# Patient Record
Sex: Female | Born: 1935 | Race: White | Hispanic: No | Smoking: Never smoker
Health system: Southern US, Community
[De-identification: ages and names within clinical notes are randomized; demographics above are authoritative.]

## PROBLEM LIST (undated history)

## (undated) DIAGNOSIS — I471 Supraventricular tachycardia, unspecified: Secondary | ICD-10-CM

## (undated) DIAGNOSIS — K589 Irritable bowel syndrome without diarrhea: Secondary | ICD-10-CM

## (undated) DIAGNOSIS — M1990 Unspecified osteoarthritis, unspecified site: Secondary | ICD-10-CM

## (undated) DIAGNOSIS — I739 Peripheral vascular disease, unspecified: Secondary | ICD-10-CM

## (undated) DIAGNOSIS — I2510 Atherosclerotic heart disease of native coronary artery without angina pectoris: Secondary | ICD-10-CM

## (undated) DIAGNOSIS — D869 Sarcoidosis, unspecified: Secondary | ICD-10-CM

## (undated) DIAGNOSIS — E785 Hyperlipidemia, unspecified: Secondary | ICD-10-CM

## (undated) DIAGNOSIS — E669 Obesity, unspecified: Secondary | ICD-10-CM

## (undated) DIAGNOSIS — K5730 Diverticulosis of large intestine without perforation or abscess without bleeding: Secondary | ICD-10-CM

## (undated) DIAGNOSIS — I872 Venous insufficiency (chronic) (peripheral): Secondary | ICD-10-CM

## (undated) DIAGNOSIS — F419 Anxiety disorder, unspecified: Secondary | ICD-10-CM

## (undated) DIAGNOSIS — Z9981 Dependence on supplemental oxygen: Secondary | ICD-10-CM

## (undated) DIAGNOSIS — R42 Dizziness and giddiness: Secondary | ICD-10-CM

## (undated) DIAGNOSIS — E119 Type 2 diabetes mellitus without complications: Secondary | ICD-10-CM

## (undated) DIAGNOSIS — E559 Vitamin D deficiency, unspecified: Secondary | ICD-10-CM

## (undated) DIAGNOSIS — I4891 Unspecified atrial fibrillation: Secondary | ICD-10-CM

## (undated) DIAGNOSIS — D696 Thrombocytopenia, unspecified: Secondary | ICD-10-CM

## (undated) DIAGNOSIS — K219 Gastro-esophageal reflux disease without esophagitis: Secondary | ICD-10-CM

## (undated) DIAGNOSIS — J45909 Unspecified asthma, uncomplicated: Secondary | ICD-10-CM

## (undated) DIAGNOSIS — I491 Atrial premature depolarization: Secondary | ICD-10-CM

## (undated) DIAGNOSIS — I38 Endocarditis, valve unspecified: Secondary | ICD-10-CM

## (undated) DIAGNOSIS — M797 Fibromyalgia: Secondary | ICD-10-CM

## (undated) DIAGNOSIS — D649 Anemia, unspecified: Secondary | ICD-10-CM

## (undated) DIAGNOSIS — I10 Essential (primary) hypertension: Secondary | ICD-10-CM

## (undated) DIAGNOSIS — N182 Chronic kidney disease, stage 2 (mild): Secondary | ICD-10-CM

## (undated) HISTORY — PX: CHOLECYSTECTOMY: SHX55

## (undated) HISTORY — PX: TONSILLECTOMY: SUR1361

## (undated) HISTORY — DX: Gastro-esophageal reflux disease without esophagitis: K21.9

## (undated) HISTORY — DX: Venous insufficiency (chronic) (peripheral): I87.2

## (undated) HISTORY — DX: Irritable bowel syndrome, unspecified: K58.9

## (undated) HISTORY — DX: Dizziness and giddiness: R42

## (undated) HISTORY — PX: HERNIA REPAIR: SHX51

## (undated) HISTORY — DX: Sarcoidosis, unspecified: D86.9

## (undated) HISTORY — DX: Peripheral vascular disease, unspecified: I73.9

## (undated) HISTORY — DX: Diverticulosis of large intestine without perforation or abscess without bleeding: K57.30

## (undated) HISTORY — PX: CATARACT EXTRACTION: SUR2

## (undated) HISTORY — DX: Essential (primary) hypertension: I10

## (undated) HISTORY — DX: Anemia, unspecified: D64.9

## (undated) HISTORY — DX: Unspecified osteoarthritis, unspecified site: M19.90

## (undated) HISTORY — DX: Type 2 diabetes mellitus without complications: E11.9

## (undated) HISTORY — DX: Anxiety disorder, unspecified: F41.9

## (undated) HISTORY — DX: Hyperlipidemia, unspecified: E78.5

## (undated) HISTORY — DX: Vitamin D deficiency, unspecified: E55.9

## (undated) HISTORY — DX: Unspecified asthma, uncomplicated: J45.909

## (undated) HISTORY — DX: Fibromyalgia: M79.7

---

## 1999-05-08 ENCOUNTER — Encounter: Payer: Self-pay | Admitting: Neurology

## 1999-05-08 ENCOUNTER — Ambulatory Visit (HOSPITAL_COMMUNITY): Admission: RE | Admit: 1999-05-08 | Discharge: 1999-05-08 | Payer: Self-pay | Admitting: Neurology

## 1999-05-20 ENCOUNTER — Ambulatory Visit (HOSPITAL_COMMUNITY): Admission: RE | Admit: 1999-05-20 | Discharge: 1999-05-20 | Payer: Self-pay | Admitting: Psychology

## 1999-05-20 ENCOUNTER — Encounter: Payer: Self-pay | Admitting: Neurology

## 2002-12-12 ENCOUNTER — Ambulatory Visit (HOSPITAL_COMMUNITY): Admission: RE | Admit: 2002-12-12 | Discharge: 2002-12-12 | Payer: Self-pay | Admitting: Pulmonary Disease

## 2002-12-12 ENCOUNTER — Encounter: Payer: Self-pay | Admitting: Pulmonary Disease

## 2004-01-04 ENCOUNTER — Inpatient Hospital Stay (HOSPITAL_COMMUNITY): Admission: EM | Admit: 2004-01-04 | Discharge: 2004-01-07 | Payer: Self-pay | Admitting: Emergency Medicine

## 2004-05-06 ENCOUNTER — Ambulatory Visit: Payer: Self-pay | Admitting: Pulmonary Disease

## 2004-05-19 ENCOUNTER — Ambulatory Visit: Payer: Self-pay

## 2004-06-18 ENCOUNTER — Ambulatory Visit: Payer: Self-pay | Admitting: Pulmonary Disease

## 2004-07-22 ENCOUNTER — Ambulatory Visit: Payer: Self-pay | Admitting: Internal Medicine

## 2004-08-05 ENCOUNTER — Ambulatory Visit: Payer: Self-pay | Admitting: Pulmonary Disease

## 2004-08-26 ENCOUNTER — Ambulatory Visit: Payer: Self-pay | Admitting: Internal Medicine

## 2004-10-22 ENCOUNTER — Ambulatory Visit: Payer: Self-pay | Admitting: Pulmonary Disease

## 2004-11-06 ENCOUNTER — Ambulatory Visit: Payer: Self-pay

## 2005-01-20 ENCOUNTER — Ambulatory Visit: Payer: Self-pay | Admitting: Pulmonary Disease

## 2005-04-21 ENCOUNTER — Ambulatory Visit: Payer: Self-pay | Admitting: Pulmonary Disease

## 2005-05-09 ENCOUNTER — Emergency Department (HOSPITAL_COMMUNITY): Admission: EM | Admit: 2005-05-09 | Discharge: 2005-05-09 | Payer: Self-pay | Admitting: Emergency Medicine

## 2005-05-13 ENCOUNTER — Ambulatory Visit: Payer: Self-pay | Admitting: Orthopedic Surgery

## 2005-06-27 ENCOUNTER — Encounter: Admission: RE | Admit: 2005-06-27 | Discharge: 2005-06-27 | Payer: Self-pay | Admitting: Neurology

## 2005-07-07 ENCOUNTER — Ambulatory Visit: Payer: Self-pay | Admitting: Pulmonary Disease

## 2005-09-30 ENCOUNTER — Ambulatory Visit: Payer: Self-pay | Admitting: Orthopedic Surgery

## 2005-10-20 ENCOUNTER — Ambulatory Visit: Payer: Self-pay | Admitting: Pulmonary Disease

## 2005-12-08 ENCOUNTER — Ambulatory Visit: Payer: Self-pay | Admitting: Pulmonary Disease

## 2006-02-23 ENCOUNTER — Ambulatory Visit: Payer: Self-pay | Admitting: Pulmonary Disease

## 2006-02-24 ENCOUNTER — Ambulatory Visit: Payer: Self-pay | Admitting: Orthopedic Surgery

## 2006-05-19 ENCOUNTER — Ambulatory Visit: Payer: Self-pay | Admitting: Orthopedic Surgery

## 2006-06-29 ENCOUNTER — Ambulatory Visit: Payer: Self-pay | Admitting: Pulmonary Disease

## 2006-06-29 LAB — CONVERTED CEMR LAB
Albumin: 4.1 g/dL (ref 3.5–5.2)
Basophils Absolute: 0 10*3/uL (ref 0.0–0.1)
Bilirubin Urine: NEGATIVE
Bilirubin, Direct: 0.1 mg/dL (ref 0.0–0.3)
Cholesterol: 121 mg/dL (ref 0–200)
Eosinophils Absolute: 0.3 10*3/uL (ref 0.0–0.6)
GFR calc Af Amer: 77 mL/min
GFR calc non Af Amer: 64 mL/min
HDL: 46 mg/dL (ref >39.0)
Hemoglobin: 11.7 g/dL — ABNORMAL LOW (ref 12.0–15.0)
Hgb A1c MFr Bld: 6.1 % — ABNORMAL HIGH (ref 4.6–6.0)
Ketones, ur: NEGATIVE mg/dL
LDL Cholesterol: 51 mg/dL (ref 0–99)
MCHC: 35.4 g/dL (ref 30.0–36.0)
Monocytes Absolute: 0.4 10*3/uL (ref 0.2–0.7)
Monocytes Relative: 5.4 % (ref 3.0–11.0)
Nitrite: NEGATIVE
Potassium: 4.1 meq/L (ref 3.5–5.1)
RDW: 13.5 % (ref 11.5–14.6)
Sodium: 138 meq/L (ref 135–145)
Specific Gravity, Urine: 1.025 (ref 1.000–1.03)
Total CHOL/HDL Ratio: 2.6
Urine Glucose: NEGATIVE mg/dL
Urobilinogen, UA: 0.2 (ref 0.0–1.0)
pH: 5.5 (ref 5.0–8.0)

## 2006-09-01 ENCOUNTER — Ambulatory Visit: Payer: Self-pay | Admitting: Orthopedic Surgery

## 2006-10-26 ENCOUNTER — Ambulatory Visit: Payer: Self-pay | Admitting: Pulmonary Disease

## 2006-12-08 ENCOUNTER — Ambulatory Visit: Payer: Self-pay | Admitting: Orthopedic Surgery

## 2007-03-01 ENCOUNTER — Ambulatory Visit: Payer: Self-pay | Admitting: Pulmonary Disease

## 2007-03-14 ENCOUNTER — Ambulatory Visit: Payer: Self-pay | Admitting: Orthopedic Surgery

## 2007-06-14 ENCOUNTER — Ambulatory Visit: Payer: Self-pay | Admitting: Pulmonary Disease

## 2007-06-14 DIAGNOSIS — J209 Acute bronchitis, unspecified: Secondary | ICD-10-CM

## 2007-06-14 DIAGNOSIS — I739 Peripheral vascular disease, unspecified: Secondary | ICD-10-CM

## 2007-06-14 DIAGNOSIS — E785 Hyperlipidemia, unspecified: Secondary | ICD-10-CM

## 2007-06-14 DIAGNOSIS — D869 Sarcoidosis, unspecified: Secondary | ICD-10-CM | POA: Insufficient documentation

## 2007-06-14 DIAGNOSIS — E663 Overweight: Secondary | ICD-10-CM | POA: Insufficient documentation

## 2007-06-14 DIAGNOSIS — K219 Gastro-esophageal reflux disease without esophagitis: Secondary | ICD-10-CM | POA: Insufficient documentation

## 2007-06-14 DIAGNOSIS — IMO0001 Reserved for inherently not codable concepts without codable children: Secondary | ICD-10-CM

## 2007-06-14 DIAGNOSIS — I872 Venous insufficiency (chronic) (peripheral): Secondary | ICD-10-CM | POA: Insufficient documentation

## 2007-06-14 DIAGNOSIS — E119 Type 2 diabetes mellitus without complications: Secondary | ICD-10-CM | POA: Insufficient documentation

## 2007-06-14 DIAGNOSIS — I10 Essential (primary) hypertension: Secondary | ICD-10-CM | POA: Insufficient documentation

## 2007-06-14 DIAGNOSIS — K589 Irritable bowel syndrome without diarrhea: Secondary | ICD-10-CM

## 2007-06-14 DIAGNOSIS — F411 Generalized anxiety disorder: Secondary | ICD-10-CM | POA: Insufficient documentation

## 2007-06-19 LAB — CONVERTED CEMR LAB
AST: 19 units/L (ref 0–37)
Albumin: 4 g/dL (ref 3.5–5.2)
Basophils Absolute: 0 10*3/uL (ref 0.0–0.1)
Bilirubin, Direct: 0.1 mg/dL (ref 0.0–0.3)
Calcium: 9.1 mg/dL (ref 8.4–10.5)
Chloride: 97 meq/L (ref 96–112)
Cholesterol: 137 mg/dL (ref 0–200)
Eosinophils Absolute: 0.5 10*3/uL (ref 0.0–0.6)
GFR calc Af Amer: 89 mL/min
GFR calc non Af Amer: 73 mL/min
Glucose, Bld: 137 mg/dL — ABNORMAL HIGH (ref 70–99)
Hgb A1c MFr Bld: 6.2 % — ABNORMAL HIGH (ref 4.6–6.0)
Lymphocytes Relative: 15.7 % (ref 12.0–46.0)
MCHC: 34.6 g/dL (ref 30.0–36.0)
MCV: 84.5 fL (ref 78.0–100.0)
Neutro Abs: 5.3 10*3/uL (ref 1.4–7.7)
Neutrophils Relative %: 72.3 % (ref 43.0–77.0)
Platelets: 199 10*3/uL (ref 150–400)
RBC: 3.7 M/uL — ABNORMAL LOW (ref 3.87–5.11)
Sodium: 138 meq/L (ref 135–145)
TSH: 6.02 microintl units/mL — ABNORMAL HIGH (ref 0.35–5.50)
Total CHOL/HDL Ratio: 3.7
Triglycerides: 165 mg/dL — ABNORMAL HIGH (ref 0–149)

## 2007-06-20 ENCOUNTER — Ambulatory Visit: Payer: Self-pay | Admitting: Orthopedic Surgery

## 2007-06-20 DIAGNOSIS — M25569 Pain in unspecified knee: Secondary | ICD-10-CM

## 2007-08-09 ENCOUNTER — Encounter: Payer: Self-pay | Admitting: Pulmonary Disease

## 2007-10-05 ENCOUNTER — Ambulatory Visit: Payer: Self-pay | Admitting: Orthopedic Surgery

## 2007-10-18 ENCOUNTER — Ambulatory Visit: Payer: Self-pay | Admitting: Pulmonary Disease

## 2007-10-18 DIAGNOSIS — D649 Anemia, unspecified: Secondary | ICD-10-CM

## 2007-10-22 LAB — CONVERTED CEMR LAB
BUN: 29 mg/dL — ABNORMAL HIGH (ref 6–23)
Basophils Absolute: 0 10*3/uL (ref 0.0–0.1)
Basophils Relative: 0.1 % (ref 0.0–1.0)
CO2: 31 meq/L (ref 19–32)
Chloride: 102 meq/L (ref 96–112)
Eosinophils Relative: 3.4 % (ref 0.0–5.0)
GFR calc Af Amer: 61 mL/min
Glucose, Bld: 103 mg/dL — ABNORMAL HIGH (ref 70–99)
Hemoglobin: 11.7 g/dL — ABNORMAL LOW (ref 12.0–15.0)
Hgb A1c MFr Bld: 6.2 % — ABNORMAL HIGH (ref 4.6–6.0)
Lymphocytes Relative: 13.9 % (ref 12.0–46.0)
MCHC: 33.5 g/dL (ref 30.0–36.0)
Monocytes Relative: 4 % (ref 3.0–12.0)
Neutro Abs: 8.9 10*3/uL — ABNORMAL HIGH (ref 1.4–7.7)
Neutrophils Relative %: 78.6 % — ABNORMAL HIGH (ref 43.0–77.0)
Potassium: 4.5 meq/L (ref 3.5–5.1)
RBC: 3.99 M/uL (ref 3.87–5.11)
Saturation Ratios: 17.3 % — ABNORMAL LOW (ref 20.0–50.0)
Sodium: 139 meq/L (ref 135–145)
Transferrin: 264.9 mg/dL (ref >=212.0)
WBC: 11.4 10*3/uL — ABNORMAL HIGH (ref 4.5–10.5)

## 2008-01-11 ENCOUNTER — Ambulatory Visit: Payer: Self-pay | Admitting: Orthopedic Surgery

## 2008-01-11 DIAGNOSIS — IMO0002 Reserved for concepts with insufficient information to code with codable children: Secondary | ICD-10-CM

## 2008-01-16 ENCOUNTER — Encounter: Payer: Self-pay | Admitting: Orthopedic Surgery

## 2008-01-26 ENCOUNTER — Encounter: Payer: Self-pay | Admitting: Orthopedic Surgery

## 2008-02-21 ENCOUNTER — Ambulatory Visit: Payer: Self-pay | Admitting: Pulmonary Disease

## 2008-02-24 LAB — CONVERTED CEMR LAB
BUN: 18 mg/dL (ref 6–23)
Calcium: 9.4 mg/dL (ref 8.4–10.5)
Eosinophils Absolute: 0.3 10*3/uL (ref 0.0–0.7)
Eosinophils Relative: 3.5 % (ref 0.0–5.0)
GFR calc Af Amer: 77 mL/min
GFR calc non Af Amer: 64 mL/min
Glucose, Bld: 119 mg/dL — ABNORMAL HIGH (ref 70–99)
HCT: 32.8 % — ABNORMAL LOW (ref 36.0–46.0)
HDL: 47.4 mg/dL (ref >39.0)
Hemoglobin: 11.4 g/dL — ABNORMAL LOW (ref 12.0–15.0)
MCV: 86.4 fL (ref 78.0–100.0)
Monocytes Absolute: 0.3 10*3/uL (ref 0.1–1.0)
Monocytes Relative: 3.8 % (ref 3.0–12.0)
Neutro Abs: 6.9 10*3/uL (ref 1.4–7.7)
RDW: 13.8 % (ref 11.5–14.6)
Sodium: 139 meq/L (ref 135–145)
Total CHOL/HDL Ratio: 2.8
Triglycerides: 141 mg/dL (ref 0–149)

## 2008-04-25 ENCOUNTER — Ambulatory Visit: Payer: Self-pay | Admitting: Orthopedic Surgery

## 2008-06-26 ENCOUNTER — Ambulatory Visit: Payer: Self-pay | Admitting: Pulmonary Disease

## 2008-06-28 DIAGNOSIS — R42 Dizziness and giddiness: Secondary | ICD-10-CM | POA: Insufficient documentation

## 2008-08-06 ENCOUNTER — Ambulatory Visit: Payer: Self-pay | Admitting: Orthopedic Surgery

## 2008-08-07 ENCOUNTER — Ambulatory Visit: Payer: Self-pay | Admitting: Pulmonary Disease

## 2008-10-23 ENCOUNTER — Ambulatory Visit: Payer: Self-pay | Admitting: Pulmonary Disease

## 2008-10-28 LAB — CONVERTED CEMR LAB
Alkaline Phosphatase: 68 units/L (ref 39–117)
Basophils Relative: 0.3 % (ref 0.0–3.0)
Bilirubin, Direct: 0.1 mg/dL (ref 0.0–0.3)
Calcium: 9.1 mg/dL (ref 8.4–10.5)
Creatinine, Ser: 1 mg/dL (ref 0.4–1.2)
Eosinophils Absolute: 0.5 10*3/uL (ref 0.0–0.7)
Eosinophils Relative: 5.9 % — ABNORMAL HIGH (ref 0.0–5.0)
GFR calc non Af Amer: 56.31 mL/min (ref >60)
HDL: 45.7 mg/dL (ref >39.00)
LDL Cholesterol: 47 mg/dL (ref 0–99)
Lymphocytes Relative: 15.8 % (ref 12.0–46.0)
MCHC: 35.1 g/dL (ref 30.0–36.0)
Monocytes Relative: 4.1 % (ref 3.0–12.0)
Neutrophils Relative %: 73.9 % (ref 43.0–77.0)
RBC: 3.76 M/uL — ABNORMAL LOW (ref 3.87–5.11)
Total CHOL/HDL Ratio: 3
Total Protein: 7.7 g/dL (ref 6.0–8.3)
Transferrin: 237.8 mg/dL (ref 212.0–360.0)
Triglycerides: 190 mg/dL — ABNORMAL HIGH (ref 0.0–149.0)
WBC: 9.2 10*3/uL (ref 4.5–10.5)

## 2008-10-30 LAB — CONVERTED CEMR LAB: Vit D, 25-Hydroxy: 13 ng/mL — ABNORMAL LOW (ref 30–89)

## 2008-11-14 ENCOUNTER — Ambulatory Visit: Payer: Self-pay | Admitting: Orthopedic Surgery

## 2008-11-14 DIAGNOSIS — M25559 Pain in unspecified hip: Secondary | ICD-10-CM

## 2009-02-19 ENCOUNTER — Ambulatory Visit: Payer: Self-pay | Admitting: Orthopedic Surgery

## 2009-02-19 DIAGNOSIS — M1710 Unilateral primary osteoarthritis, unspecified knee: Secondary | ICD-10-CM

## 2009-02-26 ENCOUNTER — Ambulatory Visit: Payer: Self-pay | Admitting: Pulmonary Disease

## 2009-02-26 DIAGNOSIS — E559 Vitamin D deficiency, unspecified: Secondary | ICD-10-CM

## 2009-04-16 ENCOUNTER — Telehealth: Payer: Self-pay | Admitting: Orthopedic Surgery

## 2009-04-17 ENCOUNTER — Ambulatory Visit: Payer: Self-pay | Admitting: Orthopedic Surgery

## 2009-04-17 DIAGNOSIS — M24469 Recurrent dislocation, unspecified knee: Secondary | ICD-10-CM

## 2009-05-29 ENCOUNTER — Ambulatory Visit: Payer: Self-pay | Admitting: Orthopedic Surgery

## 2009-05-29 DIAGNOSIS — M76899 Other specified enthesopathies of unspecified lower limb, excluding foot: Secondary | ICD-10-CM

## 2009-07-09 ENCOUNTER — Ambulatory Visit: Payer: Self-pay | Admitting: Pulmonary Disease

## 2009-07-13 LAB — CONVERTED CEMR LAB
ALT: 12 units/L (ref 0–35)
AST: 19 units/L (ref 0–37)
Albumin: 3.9 g/dL (ref 3.5–5.2)
BUN: 18 mg/dL (ref 6–23)
Chloride: 101 meq/L (ref 96–112)
Cholesterol: 129 mg/dL (ref 0–200)
Eosinophils Relative: 4.7 % (ref 0.0–5.0)
GFR calc non Af Amer: 56.21 mL/min (ref >60)
Glucose, Bld: 122 mg/dL — ABNORMAL HIGH (ref 70–99)
Hgb A1c MFr Bld: 6.3 % (ref 4.6–6.5)
Iron: 36 ug/dL — ABNORMAL LOW (ref 42–145)
Lymphocytes Relative: 16.5 % (ref 12.0–46.0)
Monocytes Relative: 4.1 % (ref 3.0–12.0)
Neutrophils Relative %: 74.5 % (ref 43.0–77.0)
Platelets: 170 10*3/uL (ref 150.0–400.0)
Potassium: 4.1 meq/L (ref 3.5–5.1)
Saturation Ratios: 12 % — ABNORMAL LOW (ref 20.0–50.0)
TSH: 4.63 microintl units/mL (ref 0.35–5.50)
VLDL: 25.8 mg/dL (ref 0.0–40.0)
WBC: 7.5 10*3/uL (ref 4.5–10.5)

## 2009-08-28 ENCOUNTER — Ambulatory Visit: Payer: Self-pay | Admitting: Orthopedic Surgery

## 2009-09-09 ENCOUNTER — Telehealth (INDEPENDENT_AMBULATORY_CARE_PROVIDER_SITE_OTHER): Payer: Self-pay | Admitting: *Deleted

## 2009-11-12 ENCOUNTER — Ambulatory Visit: Payer: Self-pay | Admitting: Internal Medicine

## 2009-11-12 ENCOUNTER — Ambulatory Visit: Payer: Self-pay | Admitting: Pulmonary Disease

## 2009-11-27 ENCOUNTER — Ambulatory Visit: Payer: Self-pay | Admitting: Orthopedic Surgery

## 2010-02-26 ENCOUNTER — Ambulatory Visit: Payer: Self-pay | Admitting: Orthopedic Surgery

## 2010-03-19 ENCOUNTER — Ambulatory Visit: Payer: Self-pay | Admitting: Pulmonary Disease

## 2010-03-27 ENCOUNTER — Encounter: Payer: Self-pay | Admitting: Pulmonary Disease

## 2010-06-10 ENCOUNTER — Ambulatory Visit
Admission: RE | Admit: 2010-06-10 | Discharge: 2010-06-10 | Payer: Self-pay | Source: Home / Self Care | Attending: Orthopedic Surgery | Admitting: Orthopedic Surgery

## 2010-06-29 LAB — CONVERTED CEMR LAB
Albumin ELP: 55.2 % — ABNORMAL LOW (ref 55.8–66.1)
Alpha-1-Globulin: 5 % — ABNORMAL HIGH (ref 2.9–4.9)
Alpha-2-Globulin: 15 % — ABNORMAL HIGH (ref 7.1–11.8)
BUN: 20 mg/dL (ref 6–23)
Basophils Absolute: 0 10*3/uL (ref 0.0–0.1)
CO2: 32 meq/L (ref 19–32)
Eosinophils Absolute: 0.6 10*3/uL (ref 0.0–0.7)
GFR calc non Af Amer: 62.62 mL/min (ref >60)
Glucose, Bld: 106 mg/dL — ABNORMAL HIGH (ref 70–99)
Hemoglobin: 10.9 g/dL — ABNORMAL LOW (ref 12.0–15.0)
IgM, Serum: 126 mg/dL (ref 60–263)
Lymphocytes Relative: 14.5 % (ref 12.0–46.0)
MCHC: 34.5 g/dL (ref 30.0–36.0)
Monocytes Absolute: 0.4 10*3/uL (ref 0.1–1.0)
Neutro Abs: 6.6 10*3/uL (ref 1.4–7.7)
Neutrophils Relative %: 73.8 % (ref 43.0–77.0)
Potassium: 4.8 meq/L (ref 3.5–5.1)
RDW: 13.9 % (ref 11.5–14.6)
Saturation Ratios: 19.2 % — ABNORMAL LOW (ref 20.0–50.0)
Total Protein, Serum Electrophoresis: 7.2 g/dL (ref 6.0–8.3)

## 2010-07-03 NOTE — Progress Notes (Signed)
Summary: prescription  Phone Note Call from Patient Call back at Home Phone [phone] Call back at [phone]   Caller: Patient Call For: nadel Summary of Call: Pt said SN changed her metoprolol tartrate from 50mg  to 25mg  and she wasn't informed of the change. She states that she still has 50mg  tabs left and wants to know what to do, pls advise. Initial call taken by: Darletta Moll,  September 09, 2009 4:36 PM  Follow-up for Phone Call        called, spoke with pt.  Pt states she has been taking metoprolol tartrate 50mg  two times a day.  However, she picked up her new metoprolol rx the other day and it was for 25mg  two times a day.  Requesting clarrification if she should be on 25mg  or 50mg .  I do not see where we have sent rx for 50mg  before in EMR.  Called [facility],spoke with Jonny Ruiz.  Per Jonny Ruiz, pt has filled Metoprolol Tartrate 50mg  last in Jan 25. Rx by Dr. Kriste Basque.    Will forward to SN - pls advise.  Thanks!  Follow-up by: Gweneth Dimitri RN,  September 09, 2009 5:14 PM  Additional Follow-up for Phone Call Additional follow up Details #1::        [id number]    per SN---she should be taking the metoprolol 25 mg  1 by mouth two times a day and she will stay on this dose.  thanks Randell Loop CMA  September 10, 2009 3:27 PM n    Additional Follow-up for Phone Call Additional follow up Details #2::    PT instructed that she should be on Metoprolol 25mg  one by mouth two times a day .  Pt verbalized understanding. Follow-up by: Abigail Miyamoto RN,  September 12, 2009 9:38 AM

## 2010-07-03 NOTE — Miscellaneous (Signed)
Summary: BONE DENSITY  Clinical Lists Changes  Orders: Added new Test order of T-Bone Densitometry [phone]) - Signed Added new Test order of T-Lumbar Vertebral Assessment (60454) - Signed  Appended Document: BONE DENSITY called and spoke with pt about her bmd results.  she is aware to start on the fosamax once weekly--this will be [facility]---cont the calcium, vit d, mvi---take the alendronate 70mg   first thing in the morning and wait 1 hour prior to eating.  Appended Document: BONE DENSITY pt started on fosamax due to bmd results.  pt is aware that rx has been sent to the pharmacy [facility]  December 11, 2009 2:12 PM    Clinical Lists Changes  Medications: Added new medication of ALENDRONATE SODIUM 70 MG TABS (ALENDRONATE SODIUM) take one tablet by mouth once a week--1 hour prior to eating - Signed Rx of ALENDRONATE SODIUM 70 MG TABS (ALENDRONATE SODIUM) take one tablet by mouth once a week--1 hour prior to eating;  #4 x 11;  Signed;  Entered by: Randell Loop CMA;  Authorized by: [name] MD;  Method used: Electronically to [facility] [address], [place], [place], [place]  [address], Ph: [phone], Fax: [phone]    Prescriptions: ALENDRONATE SODIUM 70 MG TABS (ALENDRONATE SODIUM) take one tablet by mouth once a week--1 hour prior to eating  #4 x 11   Entered by:   Randell Loop CMA   Authorized by:   [name] MD   Signed by:   Randell Loop CMA on 12/11/2009   Method used:   Electronically to        [facility]* (retail)       [address]       [place], [place]  [address]       Ph: [phone]       Fax: [phone]   RxID:   [id number]

## 2010-07-03 NOTE — Assessment & Plan Note (Signed)
Summary: 1m reck/klw   CC:  4 month ROV & review of mult medical problems....  History of Present Illness: 77 y/o WF here for a 4 month follow up visit... he has multiple medical problems as noted below...    ~  Sep09:  she has been doing reasonably well- she sees DrHarrison for Ortho regularly w/ shots in her knees every 70months... she is pleased w/ the results- decr pain, incr mobilitiy, and she won't need to consider surgery...  ~  May10:  stable on meds... no new complaints or concerns- she's wearing new compression hose and new balance shoes w/ decr discomfort and swelling... difficulty w/ exercise & we discussed diet + exercise to get her weight down...  ~  Sep10:  she had left cataract surg by [facility] in [place] but had some complic w/ increased eye pressure & headaches- now better... she also had shots in both knees, & left hip by DrHarrison last week- improved... stable & no new complaints or concerns...   ~  July 09, 2009:  c/o incr arthitic symptoms- continue to get shots in knees every 70mo by [employer]... she wants me to make her decision regarding TKR surg- discussed w/ pt... otherw stable- due for CXR/ EKG/ fasting labs... Rx refills for 2011...   ~  November 12, 2009:  she notes "throat closing" on occas & she recalls similar symptom resolved w/ Vistaril Rx yrs ago- we will refill Hydroxyzine capsules for Prn use...  c/o feeling cold & wonders if her Hg is lower or the iron not working... states breathing is OK, no CP/ palpit/ incr SOB/ edema/ etc... notes weight down 7# to 203# today... BP controlled;  Chol OK on the Simva40 & no myopathy symptoms;  Sugars well controlled on the Metformin... we discussed f/u anemia labs today.    Current Problem List:  Hx of ASTHMATIC BRONCHITIS, ACUTE (ICD-466.0) - uses Mucinex as needed... she has chr stable DOE w/o change... she say she walks "some" but is obviously too sedentary due to her arthritis.  Hx of SARCOIDOSIS (ICD-135) - initially  diagnosed in 1992 w/ CXR showing diffuse ILDis, +Gallium scan, ACE=61, & Bronch w/ bx showing non-caseating granulomas... treated w/ Pred & weaned off w/ no active disease since then... last CTChest 7/04 w/ ca++ Ao & coronaries, +mediastinal fat, centilobular emphysema, some scarring... stable without new symptoms of cough, sputum, CP or dyspnea...  ~  f/u CXR 1/09 showed stable chr lung changes & prom right hilum...  ~  f/u CXR 2/11 showed diffuse ILDz, no adenopathy, NAD...  HYPERTENSION (ICD-401.9) - controlled on meds: METOPROLOL 25mg Bid, AVAPRO 150mg /d, LASIX 40mg /d... BP= 138/72 today, tol Rx well...  denies HA, visual changes, CP, palipit, syncope, change in dyspnea, edema, etc...  ~  Myoview 12/05 was neg- no ischemia, no infarct...  ~  EKG 2/11 showed NSR, rate 82/min, WNL.Marland KitchenMarland Kitchen  PERIPHERAL VASCULAR DISEASE (ICD-443.9) - on ASA 81mg /d, PLAVIX 75mg /d... doing well w/o claud symptoms...  ~  Art Dopplers of LE's 6/06 showed biphasic waveforms in ant tib & peroneal arts, normal ABI's...  VENOUS INSUFFICIENCY (ICD-459.81) - on low sodium diet and the Lasix... she knows to elevate legs, wear support hose, etc...  HYPERLIPIDEMIA (ICD-272.4) - on SIMVASTATIN 40mg /d...  ~  FLP 1/08 showed TChol 121, TG 120, HDL 46, LDL 51... looks great- continue Simva40.  ~  FLP 1/09 showed TChol 137, TG 165, HDL 37, LDL 67  ~  FLP 9/09 showed TChol 133, TG 141, HDL 47, LDL  57  ~  FLP 5/10 showed TChol 131, TG 190, HDL 46, LDL 47  ~  FLP 2/11 showed TChol 129, TG 129, HDL 52, LDL 51  DM (ICD-250.00) - on diet + METFORMIN 500mg - 1Bid... unable to exercise or lose wt...   ~  labs 1/08 showed BS=130 and HgA1c=6.1  ~  labs 1/09 showed BS= 137, HgA1c= 6.2... diet and exercise are the keys!!!  ~  labs 9/09 showed BS= 119, HgA1c= 6.3  ~  labs 5/10 showed BS= 134, A1c= 6.3  ~  labs 2/11 showed BS= 122, A1c= 6.3  MORBID OBESITY (ICD-278.01) - prev wt 227#  & 5\' 2"  tall for a BMI=41-42...   ~  weight 1/10 = 217#  ~   weight 5/10 = 215#  ~  weight 9/10 = 210#  ~  weight 2/11 = 210#  ~  weight 6/11 = 203#  GERD (ICD-530.81) - pt uses OTC Prilosec Prn... denies CP, dysphagia, reflux, N/ V, etc...  DIVERTICULOSIS, COLON (ICD-562.10) - had colonoscopy by DrDBrodie 10-23-95 that was neg x divertics... now followed in /[place]...  IRRITABLE BOWEL SYNDROME (ICD-564.1)  DEGENERATIVE JOINT DISEASE, ADVANCED (ICD-715.90) - she has mod severe bilat knee pain and sees DrHarrison, Ortho for shots "every three months"... she is opposed to surgery (see his EMR notes- reviewed)...  VITAMIN D DEFICIENCY (ICD-268.9)  ~  labs 5/10 showed Vit D level =13... therefore rec Vit D 50000 u weekly.  ~  labs 2/11 showed Vit D level = 32... OK to change to 1000 u daily..  ~  BMD done 6/11 = pending  FIBROMYALGIA (ICD-729.1)  ANXIETY (ICD-300.00) - we perscribed HYDROXYZINE PAMOATE 75mg /d Prn for "throat closing" symptoms...  ANEMIA (ICD-285.9) - hx mild anemia w/ Hg = 10.8 Jan09 & started Fe daily at that time...   ~  labs 5/09 showed Hg= 11.7, Fe= 64  ~  labs 9/09 showed Hg= 11.4, Fe= 43... rec- take FeSO4 w/ Vit C 500mg /d.  ~  labs 5/10 showed Hg= 11.3,  Fe= 50  ~  labs 2/11 showed Hg= 10.7, Fe= 36 (12%)... rec> incr Fe Vit C to Bid.  ~  labs 6/11 showed =   INTERMITTENT VERTIGO (ICD-780.4) - prev eval DrWeymann w/ Meclizine Prn...   Preventive Screening-Counseling & Management  Alcohol-Tobacco     Smoking Status: never smoked  Allergies: 1)  ! Penicillin 2)  ! Codeine 3)  ! Lipitor 4)  ! * Sulfa Drugs 5)  ! Valium  Comments:  Nurse/Medical Assistant: The patient's medications and allergies were reviewed with the patient and were updated in the Medication and Allergy Lists.  Past History:  Past Medical History: Hx of ASTHMATIC BRONCHITIS, ACUTE (ICD-466.0) Hx of SARCOIDOSIS (ICD-135) HYPERTENSION (ICD-401.9) PERIPHERAL VASCULAR DISEASE (ICD-443.9) VENOUS INSUFFICIENCY (ICD-459.81) HYPERLIPIDEMIA  (ICD-272.4) DM (ICD-250.00) MORBID OBESITY (ICD-278.01) GERD (ICD-530.81) DIVERTICULOSIS, COLON (ICD-562.10) IRRITABLE BOWEL SYNDROME (ICD-564.1) DEGENERATIVE JOINT DISEASE, ADVANCED (ICD-715.90) VITAMIN D DEFICIENCY (ICD-268.9) FIBROMYALGIA (ICD-729.1) ANXIETY (ICD-300.00) ANEMIA (ICD-285.9) INTERMITTENT VERTIGO (ICD-780.4)  Past Surgical History: Cholecystectomy Tonsillectomy Hernia S/P cataract surgery  Family History: Reviewed history from 02/21/2008 and no changes required. Father died at age 64 with chf Mother died at age 53 with leukemia pt is an only child  Social History: Reviewed history from 10/23/2008 and no changes required. Widow, husb= Duanne Guess passed away in 23-Oct-2002 1 child- son lives in [place] never smoked  social alcohol retired Smoking Status:  never smoked  Review of Systems      See HPI  The patient complains of decreased hearing, dyspnea on exertion, and difficulty walking.  The patient denies anorexia, fever, weight loss, weight gain, vision loss, hoarseness, chest pain, syncope, peripheral edema, prolonged cough, headaches, hemoptysis, abdominal pain, melena, hematochezia, severe indigestion/heartburn, hematuria, incontinence, suspicious skin lesions, transient blindness, depression, unusual weight change, abnormal bleeding, enlarged lymph nodes, and angioedema.    Vital Signs:  Patient profile:   77 year old female Height:      62 inches Weight:      203 pounds BMI:     37.26 O2 Sat:      96 % on Room air Temp:     97.8 degrees F oral Pulse rate:   71 / minute BP sitting:   138 / 72  (left arm) Cuff size:   regular  Vitals Entered By: Randell Loop CMA (November 12, 2009 11:27 AM)  O2 Sat at Rest %:  96 O2 Flow:  Room air CC: 4 month ROV & review of mult medical problems... Is Patient Diabetic? No Pain Assessment Patient in pain? no      Comments meds updated today with pt   Physical Exam  Additional Exam:  WD, Obese, 77 y/o WF in  [place]... GENERAL:  Alert & oriented; pleasant & cooperative... she is very talkative... HEENT:  [place]/AT, Glasses, EOM-full, PERRLA, TMs-wax, NOSE-clear, THROAT-clear & wnl. NECK:  Supple w/ fairROM; no JVD; no carotid bruits; no thyromegaly or nodules palpated; no lymphadenopathy. CHEST:  decr BS bilat but clear w/o wheezing, rales or signs of consolidation... HEART:   Regular Rhythm; without murmurs/ rubs/ or gallops detected... ABDOMEN:  Obese w/ panniculus, soft & nontender; normal bowel sounds; no organomegaly or masses palpated. EXT:  mod arthritic changes; no varicose veins/ +venous insuffic/ tr+edema bilat NEURO:  CN's intact, no focal neuro deficits... DERM:  few ecchymoses, no rash etc...    MISC. Report  Procedure date:  11/12/2009  Findings:      CBC Platelet w/Diff (CBCD)   White Cell Count          9.0 K/uL                    4.5-10.5   Red Cell Count       [L]  3.63 Mil/uL                 3.87-5.11   Hemoglobin           [L]  10.9 g/dL                   16.1-09.6   Hematocrit           [L]  31.7 %                      36.0-46.0   MCV                       87.2 fl                     78.0-100.0   Platelet Count            204.0 K/uL                  150.0-400.0   Neutrophil %              73.8 %  43.0-77.0   Lymphocyte %              14.5 %                      12.0-46.0   Monocyte %                5.0 %                       3.0-12.0   Eosinophils%         [H]  6.2 %                       0.0-5.0   Basophils %               0.5 %                       0.0-3.0   IBC Panel (IBC)   Iron                      58 ug/dL                    04-540   Transferrin               216.1 mg/dL                 981.1-914.7   Iron Saturation      [L]  19.2 %                      20.0-50.0   B12 + Folate Panel (B12/FOL)   Vitamin B12               282 pg/mL                   211-911   Folate                    >20.0 ng/mL     Normal  >5.4 ng/mL  BMP (METABOL)    Sodium                    140 mEq/L                   135-145   Potassium                 4.8 mEq/L                   3.5-5.1   Chloride                  98 mEq/L                    96-112   Carbon Dioxide            32 mEq/L                    19-32   Glucose              [H]  106 mg/dL                   82-95   BUN                       20 mg/dL  6-23   Creatinine                0.9 mg/dL                   1.6-1.0   Calcium                   9.4 mg/dL                   9.6-04.5   GFR                       62.62 mL/min                >60   TSH (TSH)   FastTSH                   2.87 uIU/mL                 0.35-5.50   Impression & Recommendations:  Problem # 1:  ANEMIA (ICD-285.9) She is c/o feeling cold and wonders about her blood count on the iron supplements... LABS revealed> Hg= 10.9 > therefore continue the FeSO4 + Vit C daily... Note: B12 is borderline so we will supplement this Po as well w/ 1028mcg/d & recheck on ret... Her updated medication list for this problem includes:    Vitamin B-12 1000 Mcg Tabs (Cyanocobalamin) .[name]... Take 1 tab by mouth once daily...    Ferrous Sulfate 324 Mg Tbec (Ferrous sulfate) .[name]... 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 she wants to try this again & keep some on hand for Prn use... Her updated medication list for this problem includes:    Amitriptyline Hcl 10 Mg Tabs (Amitriptyline hcl) .[name]... Take one tablet daily...    Hydroxyzine Pamoate 25 Mg Caps (Hydroxyzine pamoate) .[name]... Take 1  cap by mouth every 6 h as needed...  Problem # 3:  Hx of ASTHMATIC BRONCHITIS, ACUTE (ICD-466.0) Breathing is satis>  no changes.  Problem # 4:  HYPERTENSION (ICD-401.9) BP controlled>  continue same meds. Her updated medication list for this problem includes:    Metoprolol Tartrate 25 Mg Tabs (Metoprolol tartrate) .[name]... Take 1 tab by mouth two times a day...    Avapro 150 Mg Tabs (Irbesartan) .[name]... Take 1 tablet by mouth once a day    Furosemide 40 Mg Tabs (Furosemide) .[name]... Take 1 tab by mouth once daily...  Problem # 5:  PERIPHERAL VASCULAR DISEASE (ICD-443.9) Aware>  continue ASA & Plavix...  Problem # 6:  HYPERLIPIDEMIA (ICD-272.4) Stable on Simva40... no myopathy symptoms in evidence... continue med. Her updated medication list for this problem includes:    Simvastatin 40 Mg Tabs (Simvastatin) .[name]... Take 1 tab by mouth at bedtime...  Problem # 7:  DM (ICD-250.00) Doing satis on the Metformin + diet & weight down 7# as noted... Her updated medication list for this problem includes:    Aspirin 81 Mg Tabs (Aspirin) .[name]... Take 1 tablet by mouth once a day    Avapro 150 Mg Tabs (Irbesartan) .[name]... Take 1 tablet by mouth once a day    Metformin  Hcl 500 Mg Tabs (Metformin hcl) .[name]... Take 1 tab by mouth two times a day...  Problem # 8:  OTHER MEDICAL PROBLEMS AS NOTED>>>  Complete Medication List: 1)  Aspirin 81 Mg Tabs (Aspirin) .... Take 1 tablet by mouth once a day 2)  Plavix 75 Mg Tabs (Clopidogrel bisulfate) .... Take 1 tablet by mouth once a day 3)  Metoprolol Tartrate 25 Mg Tabs (Metoprolol tartrate) .... Take 1 tab by mouth two times a day... 4)  Avapro 150 Mg Tabs (Irbesartan) .... Take 1 tablet by mouth once a day 5)  Furosemide 40 Mg Tabs (Furosemide) .... Take 1 tab by mouth once daily.Marland KitchenMarland Kitchen 6)  Simvastatin 40 Mg Tabs (Simvastatin) .... Take 1 tab by mouth at bedtime.Marland KitchenMarland Kitchen 7)  Metformin Hcl 500 Mg Tabs (Metformin hcl) .... Take 1 tab by mouth two times a day... 8)  Meclizine  Hcl 25 Mg Tabs (Meclizine hcl) .... Take 1 tablet by mouth every four hours as needed for dizziness 9)  Amitriptyline Hcl 10 Mg Tabs (Amitriptyline hcl) .... Take one tablet daily... 10)  Vitamin B-12 1000 Mcg Tabs (Cyanocobalamin) .... Take 1 tab by mouth once daily... 11)  Ferrous Sulfate 324 Mg Tbec (Ferrous sulfate) .... Take 1 tab by mouth two times a day 12)  Vitamin C 500 Mg Tabs (Ascorbic acid) .... Take 1 tab by mouth two times daily w/ the iron tab... 13)  Vitamin D 1000 Unit Tabs (Cholecalciferol) .... Take 1 tablet by mouth once a day 14)  Onetouch Ultra Test Strp (Glucose blood) .... Test as directed 15)  Hydroxyzine Pamoate 25 Mg Caps (Hydroxyzine pamoate) .... Take 1 cap by mouth every 6 h as needed...  Patient Instructions: 1)  Today we updated your med list- see below.... 2)  Continue your current meds the same for now... 3)  We wrote the new perscription for the capsule to help you if you feel your throat closing up... 4)  Today we did your follow up blood work to check your blood count etc... please call the "phone tree" in a few days for your lab results.Marland KitchenMarland Kitchen 5)  Call for any questions.Marland KitchenMarland Kitchen 6)  Please schedule a follow-up appointment in 4 months. Prescriptions: HYDROXYZINE PAMOATE 25 MG CAPS (HYDROXYZINE PAMOATE) take 1 cap by mouth every 6 H as needed...  #50 x 5   Entered and Authorized by:   [name] MD   Signed by:   [name] MD on 11/12/2009   Method used:   Print then Give to Patient   RxID:   [id number]

## 2010-07-03 NOTE — Assessment & Plan Note (Signed)
Summary: rov/apc   CC:  4 month ROV & review of mult medical problems....  History of Present Illness: 77 y/o WF here for a 4 month follow up visit... he has multiple medical problems as noted below...    ~  Sep09:  she has been doing reasonably well- she sees Alexandra [name] for Ortho regularly w/ shots in her knees every 27months... she is pleased w/ the results- decr pain, incr mobilitiy, and she won't need to consider surgery...  ~  May10:  stable on meds... no new complaints or concerns- she's wearing new compression hose and new balance shoes w/ decr discomfort and swelling... difficulty w/ exercise & we discussed diet + exercise to get her weight down...  ~  Sep10:  she had left cataract surg by [facility] in [place] but had some complic w/ increased eye pressure & headaches- now better... she also had shots in both knees, & left hip by Alexandra [name] last week- improved... stable & no new complaints or concerns...   ~  July 09, 2009:  c/o incr arthitic symptoms- continue to get shots in knees every 27mo by [employer]... she wants me to make her decision regarding TKR surg- discussed w/ pt... otherw stable- due for CXR/ EKG/ fasting labs... Rx refills for 2011...    Current Problem List:  Hx of ASTHMATIC BRONCHITIS, ACUTE (ICD-466.0) - uses Mucinex as needed... she has chr stable DOE w/o change... she say she walks "some" but is obviously too sedentary due to her arthritis.  Hx of SARCOIDOSIS (ICD-135) - initially diagnosed in 1992 w/ CXR showing diffuse ILDis, +Gallium scan, ACE=61, & Bronch w/ bx showing non-caseating granulomas... treated w/ Pred & weaned off w/ no active disease since then... last CTChest 7/04 w/ ca++ Ao & coronaries, +mediastinal fat, centilobular emphysema, some scarring... stable without new symptoms of cough, sputum, CP or dyspnea...  ~  f/u CXR 1/09 showed stable chr lung changes & prom right hilum...  ~  f/u CXR 2/11 showed diffuse ILDz, no adenopathy, NAD...  HYPERTENSION  (ICD-401.9) - controlled on meds: METOPROLOL 25mg Bid, AVAPRO 150mg /d, LASIX 40mg /d... BP= 150/78 today, tol Rx well...  denies HA, visual changes, CP, palipit, syncope, change in dyspnea, edema, etc...  ~  Myoview 12/05 was neg- no ischemia, no infarct...  ~  EKG 2/11 showed NSR, rate 82/min, WNL.Marland KitchenMarland Kitchen  PERIPHERAL VASCULAR DISEASE (ICD-443.9) - on ASA 325mg /d, PLAVIX 75mg /d... doing well w/o claud symptoms...  ~  Art Dopplers of LE's 6/06 showed biphasic waveforms in ant tib & peroneal arts, normal ABI's...  VENOUS INSUFFICIENCY (ICD-459.81) - on low sodium diet and the Lasix... she knows to elevate legs, wear support hose, etc...  HYPERLIPIDEMIA (ICD-272.4) - on SIMVASTATIN 40mg /d...  ~  FLP 1/08 showed TChol 121, TG 120, HDL 46, LDL 51... looks great- continue Simva40.  ~  FLP 1/09 showed TChol 137, TG 165, HDL 37, LDL 67  ~  FLP 9/09 showed TChol 133, TG 141, HDL 47, LDL 57  ~  FLP 5/10 showed TChol 131, TG 190, HDL 46, LDL 47  ~  FLP 2/11 showed TChol 129, TG 129, HDL 52, LDL 51  DM (ICD-250.00) - on diet + METFORMIN 500mg - 1Bid... unable to exercise or lose wt...   ~  labs 1/08 showed BS=130 and HgA1c=6.1  ~  labs 1/09 showed BS= 137, HgA1c= 6.2... diet and exercise are the keys!!!  ~  labs 9/09 showed BS= 119, HgA1c= 6.3  ~  labs 5/10 showed BS= 134, A1c= 6.3  ~  labs 2/11 showed BS= 122, A1c= 6.3  MORBID OBESITY (ICD-278.01) - prev wt 227#  & 5\' 2"  tall for a BMI=41-42...   ~  weight 1/10 = 217#  ~  weight 5/10 = 215#  ~  weight 9/10 = 210#  ~  weight 2/11 = 210#  GERD (ICD-530.81) - pt uses OTC Prilosec Prn... denies CP, dysphagia, reflux, N/ V, etc...  DIVERTICULOSIS, COLON (ICD-562.10) - had colonoscopy by DrDBrodie 10-15-95 that was neg x divertics... now followed in [place]...  IRRITABLE BOWEL SYNDROME (ICD-564.1)  DEGENERATIVE JOINT DISEASE, ADVANCED (ICD-715.90) - she has mod severe bilat knee pain and sees Alexandra [name], Ortho for shots "every three months"... she is  opposed to surgery (see his EMR notes- reviewed)...  VITAMIN D DEFICIENCY (ICD-268.9)  ~  labs 5/10 showed Vit D level =13... therefore rec Vit D 50000 u weekly.  ~  labs 2/11 showed Vit D level = 32... OK to change to 1000 u daily...  FIBROMYALGIA (ICD-729.1)  ANXIETY (ICD-300.00)  ANEMIA (ICD-285.9) - hx mild anemia w/ Hg = 10.8 Jan09 & started Fe daily at that time...   ~  labs 5/09 showed Hg= 11.7, Fe= 64  ~  labs 9/09 showed Hg= 11.4, Fe= 43... rec- take FeSO4 w/ Vit C 500mg /d...  ~  labs 5/10 showed Hg= 11.3,  Fe= 50  ~  labs 2/11 showed Hg= 10.7, Fe= 36 (12%)... rec> incr Fe Vit C to Bid...  INTERMITTENT VERTIGO (ICD-780.4) - prev eval DrWeymann w/ Meclizine Prn...    Allergies: 1)  ! Penicillin 2)  ! Codeine 3)  ! Lipitor 4)  ! * Sulfa Drugs 5)  ! Valium  Comments:  Nurse/Medical Assistant: The patient's medications and allergies were reviewed with the patient and were updated in the Medication and Allergy Lists.  Past History:  Past Medical History:  Hx of ASTHMATIC BRONCHITIS, ACUTE (ICD-466.0) Hx of SARCOIDOSIS (ICD-135) HYPERTENSION (ICD-401.9) PERIPHERAL VASCULAR DISEASE (ICD-443.9) VENOUS INSUFFICIENCY (ICD-459.81) HYPERLIPIDEMIA (ICD-272.4) DM (ICD-250.00) MORBID OBESITY (ICD-278.01) GERD (ICD-530.81) DIVERTICULOSIS, COLON (ICD-562.10) IRRITABLE BOWEL SYNDROME (ICD-564.1) DEGENERATIVE JOINT DISEASE, ADVANCED (ICD-715.90) VITAMIN D DEFICIENCY (ICD-268.9) FIBROMYALGIA (ICD-729.1) ANXIETY (ICD-300.00) ANEMIA (ICD-285.9) INTERMITTENT VERTIGO (ICD-780.4)  Past Surgical History: Cholecystectomy Tonsillectomy Hernia S/P cataract surgery  Family History: Reviewed history from 02/21/2008 and no changes required. Father died at age 25 with chf Mother died at age 44 with leukemia pt is an only child  Social History: Reviewed history from 10/23/2008 and no changes required. Widow, husb= Duanne Guess passed away in Oct 15, 2002 1 child- son lives in [place] never  smoked  social alcohol retired  Review of Systems      See HPI       The patient complains of decreased hearing, dyspnea on exertion, peripheral edema, muscle weakness, and difficulty walking.  The patient denies anorexia, fever, weight loss, weight gain, vision loss, hoarseness, chest pain, syncope, prolonged cough, headaches, hemoptysis, abdominal pain, melena, hematochezia, severe indigestion/heartburn, hematuria, incontinence, suspicious skin lesions, transient blindness, depression, unusual weight change, abnormal bleeding, enlarged lymph nodes, and angioedema.    Vital Signs:  Patient profile:   77 year old female Height:      62 inches Weight:      210.13 pounds O2 Sat:      94 % on Room air Temp:     97.0 degrees F oral Pulse rate:   94 / minute BP sitting:   150 / 78  (left arm) Cuff size:   regular  Vitals Entered By: Randell Loop CMA (July 09, 2009 11:46 AM)  O2 Sat at Rest %:  94 O2 Flow:  Room air CC: 4 month ROV & review of mult medical problems... Comments meds updated today   Physical Exam  Additional Exam:  WD, Obese, 77 y/o WF in NAD... GENERAL:  Alert & oriented; pleasant & cooperative... she is very talkative... HEENT:  [place]/AT, Glasses, EOM-full, PERRLA, TMs-wax, NOSE-clear, THROAT-clear & wnl. NECK:  Supple w/ fairROM; no JVD; no carotid bruits; no thyromegaly or nodules palpated; no lymphadenopathy. CHEST:  decr BS bilat but clear w/o wheezing, rales or signs of consolidation... HEART:   Regular Rhythm; without murmurs/ rubs/ or gallops detected... ABDOMEN:  Obese w/ panniculus, soft & nontender; normal bowel sounds; no organomegaly or masses palpated. EXT:  mod arthritic changes; no varicose veins/ +venous insuffic/ tr-1+edema bilat NEURO:  CN's intact, no focal neuro deficits... DERM:  few ecchymoses, no rash etc...     CXR  Procedure date:  07/09/2009  Findings:      CHEST - 2 VIEW Comparison: 06/14/2007   Findings: There is diffuse  interstitial lung disease, stable.  No confluent opacities or effusions.  No definite adenopathy.  Heart is normal size.   Degenerative changes in the thoracic spine.   IMPRESSION: Stable chronic interstitial lung disease.   Read By:  Charlett Nose,  M.D.   EKG  Procedure date:  07/09/2009  Findings:      Normal sinus rhythm with rate of:  82/min... Tracing is WNL... SN  MISC. Report  Procedure date:  07/09/2009  Findings:      Lipid Panel (LIPID)   Cholesterol               129 mg/dL                   0-454   Triglycerides             129.0 mg/dL                 0.9-811.9   HDL                       14.78 mg/dL                 >29.56   LDL Cholesterol           51 mg/dL                    2-13  BMP (METABOL)   Sodium                    140 mEq/L                   135-145   Potassium                 4.1 mEq/L                   3.5-5.1   Chloride                  101 mEq/L                   96-112   Carbon Dioxide            32 mEq/L                    19-32   Glucose              [  H]  122 mg/dL                   16-10   BUN                       18 mg/dL                    9-60   Creatinine                1.0 mg/dL                   4.5-4.0   Calcium                   8.9 mg/dL                   9.8-11.9   GFR                       56.21 mL/min                >60  Hepatic/Liver Function Panel (HEPATIC)   Total Bilirubin           0.4 mg/dL                   1.4-7.8   Direct Bilirubin          0.1 mg/dL                   2.9-5.6   Alkaline Phosphatase      62 U/L                      39-117   AST                       19 U/L                      0-37   ALT                       12 U/L                      0-35   Total Protein             7.7 g/dL                    2.1-3.0   Albumin                   3.9 g/dL                    8.6-5.7  Comments:      CBC Platelet w/Diff (CBCD)   White Cell Count          7.5 K/uL                    4.5-10.5   Red Cell Count        [L]  3.58 Mil/uL                 3.87-5.11   Hemoglobin           [L]  10.7 g/dL                   84.6-96.2   Hematocrit           [  L]  31.8 %                      36.0-46.0   MCV                       88.6 fl                     78.0-100.0   Platelet Count            170.0 K/uL                  150.0-400.0   Neutrophil %              74.5 %                      43.0-77.0   Lymphocyte %              16.5 %                      12.0-46.0   Monocyte %                4.1 %                       3.0-12.0   Eosinophils%              4.7 %                       0.0-5.0   Basophils %               0.2 %                       0.0-3.0  TSH (TSH)   FastTSH                   4.63 uIU/mL                 0.35-5.50  IBC Panel (IBC)   Iron                 [L]  36 ug/dL                    16-109   Transferrin               213.4 mg/dL                 604.5-409.8   Iron Saturation      [L]  12.0 %                      20.0-50.0  Hemoglobin A1C (A1C)   Hemoglobin A1C            6.3 %                       4.6-6.5  Vitamin D (25-Hydroxy) (11914)  Vitamin D (25-Hydroxy)                             32 ng/mL                    30-89  Impression & Recommendations:  Problem # 1:  Hx of SARCOIDOSIS (ICD-135) Hx AB & remote  inactive Sarcoid-  no problems, doing satis...  Orders: T-2 View CXR (71020TC) TLB-Lipid Panel (80061-LIPID) TLB-BMP (Basic Metabolic Panel-BMET) (80048-METABOL) TLB-Hepatic/Liver Function Pnl (80076-HEPATIC) TLB-CBC Platelet - w/Differential (85025-CBCD) TLB-TSH (Thyroid Stimulating Hormone) (84443-TSH) TLB-IBC Pnl (Iron/FE;Transferrin) (83550-IBC) TLB-A1C / Hgb A1C (Glycohemoglobin) (83036-A1C) T-Vitamin D (25-Hydroxy) ([address]-60[address])  Problem # 2:  HYPERTENSION (ICD-401.9) Controlled-  continue same meds, but needs better diet, no salt, get weight down!!! Her updated medication list for this problem includes:    Metoprolol Tartrate 25 Mg Tabs (Metoprolol tartrate) .[name]... Take  1 tab by mouth two times a day...    Avapro 150 Mg Tabs (Irbesartan) .[name]... Take 1 tablet by mouth once a day    Furosemide 40 Mg Tabs (Furosemide) .[name]... Take 1 tab by mouth once daily...  Problem # 3:  PERIPHERAL VASCULAR DISEASE (ICD-443.9) Stable on ASA + Plavix... continue same.  Problem # 4:  VENOUS INSUFFICIENCY (ICD-459.81) Stable on the sodium restriction, elevation, support hose, etc...  Problem # 5:  HYPERLIPIDEMIA (ICD-272.4) Stable on the Simva40... Her updated medication list for this problem includes:    Simvastatin 40 Mg Tabs (Simvastatin) .[name]... Take 1 tab by mouth at bedtime...  Problem # 6:  DM (ICD-250.00) Remains under control w/ the Metformin... needs to lose weight!!! Her updated medication list for this problem includes:    Bayer Aspirin 325 Mg Tabs (Aspirin) .[name]... Take 1 tablet by mouth once a day    Avapro 150 Mg Tabs (Irbesartan) .[name]... Take 1 tablet by mouth once a day    Metformin Hcl 500 Mg Tabs (Metformin hcl) .[name]... Take 1 tab by mouth two times a day...  Problem # 7:  MORBID OBESITY (ICD-278.01) Weight reduction is the key...  Problem # 8:  DIVERTICULOSIS, COLON (ICD-562.10) GI is stable-  followed by DrReymon in [place].  Problem # 9:  DEGENERATIVE JOINT DISEASE, ADVANCED (ICD-715.90) This is her CC... Alexandra [name] Rx w/ shots, pt considering TKR, continues on shots Q42mo... Her updated medication list for this problem includes:    Bayer Aspirin 325 Mg Tabs (Aspirin) .[name]... Take 1 tablet by mouth once a day    Tylenol Extra Strength 500 Mg Tabs (Acetaminophen) .[name]... As needed for pain  Problem # 10:  VITAMIN D DEFICIENCY (ICD-268.9) F/u Vit d level improved... OK to switch to 1000 u OTC daily...  Complete Medication List: 1)  Bayer Aspirin 325 Mg Tabs (Aspirin) .... Take 1 tablet by mouth once a day 2)  Plavix 75 Mg Tabs (Clopidogrel bisulfate) .... Take 1 tablet by mouth once a day 3)  Metoprolol Tartrate 25 Mg Tabs (Metoprolol tartrate) .... Take 1  tab by mouth two times a day... 4)  Avapro 150 Mg Tabs (Irbesartan) .... Take 1 tablet by mouth once a day 5)  Furosemide 40 Mg Tabs (Furosemide) .... Take 1 tab by mouth once daily.Marland KitchenMarland Kitchen 6)  Simvastatin 40 Mg Tabs (Simvastatin) .... Take 1 tab by mouth at bedtime.Marland KitchenMarland Kitchen 7)  Metformin Hcl 500 Mg Tabs (Metformin hcl) .... Take 1 tab by mouth two times a day... 8)  Meclizine Hcl 25 Mg Tabs (Meclizine hcl) .... Take 1 tablet by mouth every four hours as needed for dizziness 9)  Ferrous Sulfate 324 Mg Tbec (Ferrous sulfate) .... Take 1 tab by mouth once daily.... 10)  Vitamin C 500 Mg Tabs (Ascorbic acid) .... Take 1 tab by mouth once daily w/ the iron tab... 11)  Amitriptyline Hcl 10 Mg Tabs (Amitriptyline hcl) .... Take one tablet daily... 12)  Tylenol Extra Strength  500 Mg Tabs (Acetaminophen) .... As needed for pain 13)  Vitamin D (ergocalciferol) 50000 Unit Caps (Ergocalciferol) .... Take one capsule by mouth once weekly  Other Orders: Prescription Created Electronically [id number]) 12 Lead EKG (12 Lead EKG)  Patient Instructions: 1)  Today we updated your med list- see below.... 2)  We refilled your meds for 2011... 3)  Today we did your follow up CXR, EKG, & fasting blood work... we will call you w/ the results when avail.Marland KitchenMarland Kitchen 4)  Stay as active as possible... 5)  Be careful, NO FALLING!!! 6)  Call for any problems.Marland KitchenMarland Kitchen 7)  Please schedule a follow-up appointment in 4 months. Prescriptions: AMITRIPTYLINE HCL 10 MG TABS (AMITRIPTYLINE HCL) Take one tablet daily...  #30 x prn   Entered and Authorized by:   [name] MD   Signed by:   [name] MD on 07/09/2009   Method used:   Print then Give to Patient   RxID:   [id number] MECLIZINE HCL 25 MG TABS (MECLIZINE HCL) Take 1 tablet by mouth every four hours as needed for dizziness  #100 x prn   Entered and Authorized by:   [name] MD   Signed by:   [name] MD on 07/09/2009   Method used:   Print then Give to Patient   RxID:    [id number] METFORMIN HCL 500 MG  TABS (METFORMIN HCL) take 1 tab by mouth two times a day...  #60 x prn   Entered and Authorized by:   [name] MD   Signed by:   [name] MD on 07/09/2009   Method used:   Print then Give to Patient   RxID:   [id number] SIMVASTATIN 40 MG  TABS (SIMVASTATIN) take 1 tab by mouth at bedtime...  #30 x prn   Entered and Authorized by:   [name] MD   Signed by:   [name] MD on 07/09/2009   Method used:   Print then Give to Patient   RxID:   [id number] FUROSEMIDE 40 MG TABS (FUROSEMIDE) take 1 tab by mouth once daily...  #30 x prn   Entered and Authorized by:   [name] MD   Signed by:   [name] MD on 07/09/2009   Method used:   Print then Give to Patient   RxID:   [id number] AVAPRO 150 MG  TABS (IRBESARTAN) Take 1 tablet by mouth once a day  #30 x prn   Entered and Authorized by:   [name] MD   Signed by:   [name] MD on 07/09/2009   Method used:   Print then Give to Patient   RxID:   [id number] METOPROLOL TARTRATE 25 MG  TABS (METOPROLOL TARTRATE) take 1 tab by mouth two times a day...  #60 x prn   Entered and Authorized by:   [name] MD   Signed by:   [name] MD on 07/09/2009   Method used:   Print then Give to Patient   RxID:   [id number] PLAVIX 75 MG  TABS (CLOPIDOGREL BISULFATE) Take 1 tablet by mouth once a day  #30 x prn   Entered and Authorized by:   [name] MD   Signed by:   [name] MD on 07/09/2009   Method used:   Print then Give to Patient   RxID:   [id number]    CardioPerfect ECG  ID: [id number]  Patient: Alexandra [name], Alexandra [name] DOB: 1923/12/26 Age: 77 Years Old Sex: Female Race: White Physician: Najma Bozarth Technician: Randell Loop CMA Height: 62 Weight: 210.13 Status: Unconfirmed Past Medical History:  Hx of ASTHMATIC BRONCHITIS, ACUTE (ICD-466.0) Hx of SARCOIDOSIS (ICD-135) HYPERTENSION (ICD-401.9) PERIPHERAL VASCULAR  DISEASE (ICD-443.9) VENOUS INSUFFICIENCY (ICD-459.81) HYPERLIPIDEMIA (ICD-272.4) DM (ICD-250.00) MORBID OBESITY (ICD-278.01) GERD (ICD-530.81) DIVERTICULOSIS, COLON (ICD-562.10) IRRITABLE BOWEL SYNDROME (ICD-564.1) DEGENERATIVE JOINT DISEASE, ADVANCED (ICD-715.90) VITAMIN D DEFICIENCY (ICD-268.9) FIBROMYALGIA (ICD-729.1) ANXIETY (ICD-300.00) ANEMIA (ICD-285.9) INTERMITTENT VERTIGO (ICD-780.4)   Recorded: 07/09/2009 12:22 AM P/PR: 118 ms / 165 ms - Heart rate (maximum exercise) QRS: 83 QT/QTc/QTd: 373 ms / 413 ms / 31 ms - Heart rate (maximum exercise)  P/QRS/T axis: 70 deg / 22 deg / 32 deg - Heart rate (maximum exercise)  Heartrate: 83 bpm  Interpretation:  Normal sinus rhythm with rate of:  82/min... Tracing is WNL.Marland KitchenMarland Kitchen SN

## 2010-07-03 NOTE — Medication Information (Signed)
Summary: Glucose Supplies/[facility]  Glucose Supplies/[facility]   Imported By: Sherian Rein 04/04/2010 08:43:28  _____________________________________________________________________  External Attachment:    Type:   Image     Comment:   External Document

## 2010-07-03 NOTE — Assessment & Plan Note (Signed)
Summary: 3 M RE-CK KNEE/?XRAY/MEDICARE,B.LIFE/CAF   Visit Type:  Follow-up Referring Provider:  self  CC:  3 month recheck knees.  History of Present Illness: I saw Alexandra [name] in the office today for a followup visit.  She is a 77 years old woman with the complaint of:  3 month recheck on bilateral knee OA.  Requesting injections today.  Tylenol for pain.    Allergies: 1)  ! Penicillin 2)  ! Codeine 3)  ! Lipitor 4)  ! * Sulfa Drugs 5)  ! Valium   Impression & Recommendations:  Problem # 1:  KNEE, ARTHRITIS, DEGEN./OSTEO (ZOX-096.04) Assessment Comment Only  Routine Injections   Verbal consent was obtained. The knee was prepped with alcohol and ethyl chloride. 1 cc of depomedrol 40mg /cc and 4 cc of lidocaine 1% was injected. there were no complications.  right and left knee  Her updated medication list for this problem includes:    Bayer Aspirin 325 Mg Tabs (Aspirin) .[name]... Take 1 tablet by mouth once a day    Tylenol Extra Strength 500 Mg Tabs (Acetaminophen) .[name]... As needed for pain  Orders: Joint Aspirate / Injection, Large (20610) Depo- Medrol 40mg  (J1030)  Patient Instructions: 1)  You have received an injection of cortisone today. You may experience increased pain at the injection site. Apply ice pack to the area for 20 minutes every 2 hours and take 2 xtra strength tylenol every 8 hours. This increased pain will usually resolve in 24 hours. The injection will take effect in 3-10 days.  2)  Please schedule a follow-up appointment in 3 months. repeat injections

## 2010-07-03 NOTE — Assessment & Plan Note (Signed)
Summary: 3 M RE-CK KNEE/MEDICARE,B.LIFE/CAF   Visit Type:  Follow-up Referring Provider:  self  CC:  BILATERAL KNEE OA.  History of Present Illness: I saw Alexandra [name] in the office today for a followup visit.  She is a 77 years old woman with the complaint of:  BILATERAL KNEE PAIN, OA.  REQUESTING INJECTIONS.  02/26/10 LAST INJECTIONS  TYLENOL XS FOR PAIN HELPS.  ROS for 2 months she has had left hip pain to the buttocks that goes down to her ankle.  Verbal consent was obtained. The RIGHT knee was prepped with alcohol and ethyl chloride. 1 cc of depomedrol 40mg /cc and 4 cc of lidocaine 1% was injected. there were no complications.  Verbal consent was obtained. The left knee was prepped with alcohol and ethyl chloride. 1 cc of depomedrol 40mg /cc and 4 cc of lidocaine 1% was injected. there were no complications.     Allergies: 1)  ! Penicillin 2)  ! Codeine 3)  ! Lipitor 4)  ! * Sulfa Drugs 5)  ! Valium   Impression & Recommendations:  Problem # 1:  KNEE, ARTHRITIS, DEGEN./OSTEO (ICD-715.96) Assessment Deteriorated  Her updated medication list for this problem includes:    Aspirin 81 Mg Tabs (Aspirin) .[name]... Take 1 tablet by mouth once a day  Orders: Joint Aspirate / Injection, Large (20610) Depo- Medrol 40mg  (J1030)  Medications Added to Medication List This Visit: 1)  Neurontin 100 Mg Caps (Gabapentin) .[name].. 1 by mouth at bedtime may increase to 2 tabs every evening  Patient Instructions: 1)  You have received an injection of cortisone today. You may experience increased pain at the injection site. Apply ice pack to the area for 20 minutes every 2 hours and take 2 xtra strength tylenol every 8 hours. This increased pain will usually resolve in 24 hours. The injection will take effect in 3-10 days.   2)  Please schedule a follow-up appointment in 3 months. Prescriptions: NEURONTIN 100 MG CAPS (GABAPENTIN) 1 by mouth at bedtime may increase to 2 tabs every evening   #60 x 1   Entered and Authorized by:   Fuller Canada MD   Signed by:   Fuller Canada MD on 06/10/2010   Method used:   Printed then faxed to ...       [facility]* (retail)       [address]       [place], [place]  [address]       Ph: [phone]       Fax: [phone]   RxID:   [id number]    Orders Added: 1)  Joint Aspirate / Injection, Large [20610] 2)  Depo- Medrol 40mg  [J1030]

## 2010-07-03 NOTE — Assessment & Plan Note (Signed)
Summary: F/U 4 MONTHS///KP   CC:  4 month ROV & review of mult medical problems....  History of Present Illness: 77 y/o WF here for a 4 month follow up visit... he has multiple medical problems as noted below...    ~  Sep09:  she has been doing reasonably well- she sees DrHarrison for Ortho regularly w/ shots in her knees every 69months... she is pleased w/ the results- decr pain, incr mobilitiy, and she won't need to consider surgery...  ~  May10:  stable on meds... no new complaints or concerns- she's wearing new compression hose and new balance shoes w/ decr discomfort and swelling... difficulty w/ exercise & we discussed diet + exercise to get her weight down...  ~  Sep10:  she had left cataract surg by [facility] in [place] but had some complic w/ increased eye pressure & headaches- now better... she also had shots in both knees, & left hip by DrHarrison last week- improved... stable & no new complaints or concerns...   ~  July 09, 2009:  c/o incr arthitic symptoms- continue to get shots in knees every 69mo by [employer]... she wants me to make her decision regarding TKR surg- discussed w/ pt... otherw stable- due for CXR/ EKG/ fasting labs... Rx refills for 2011...   ~  November 12, 2009:  she notes "throat closing" on occas & she recalls similar symptom resolved w/ Vistaril Rx yrs ago- we will refill Hydroxyzine capsules for Prn use...  c/o feeling cold & wonders if her Hg is lower or the iron not working... states breathing is OK, no CP/ palpit/ incr SOB/ edema/ etc... notes weight down 7# to 203# today... BP controlled;  Chol OK on the Simva40 & no myopathy symptoms;  Sugars well controlled on the Metformin... we discussed f/u anemia labs today.   ~  March 19, 2010:  4 month ROV & she notes stable- Hydroxyzine helps "throat" episodes that occur w/ anxiety; no intercurrent infections;  BP stable on meds;  still too sedentary & not effectively dieting (wt w/o change);  she had BMD 6/11= osteoprotic  in left [facility] & Alendronate recommended to her but she flat out refuses to take the Bisphos Rx "I'l take my chances"...   Current Problem List:  Hx of ASTHMATIC BRONCHITIS, ACUTE (ICD-466.0) - uses Mucinex as needed... she has chr stable DOE w/o change... she say she walks "some" but is obviously too sedentary due to her arthritis... she has PNEUMOVAX several yrs ago.  Hx of SARCOIDOSIS (ICD-135) - initially diagnosed in 1992 w/ CXR showing diffuse ILDis, +Gallium scan, ACE=61, & Bronch w/ bx showing non-caseating granulomas... treated w/ Pred & weaned off w/ no active disease since then... last CTChest 7/04 w/ ca++ Ao & coronaries, +mediastinal fat, centilobular emphysema, some scarring... stable without new symptoms of cough, sputum, CP or dyspnea...  ~  f/u CXR 1/09 showed stable chr lung changes & prom right hilum...  ~  f/u CXR 2/11 showed diffuse ILDz, no adenopathy, NAD...  HYPERTENSION (ICD-401.9) - controlled on meds: METOPROLOL 25mg Bid, AVAPRO 150mg /d, LASIX 40mg /d... BP= 140/66 today, tol Rx well...  denies HA, visual changes, CP, palipit, syncope, change in dyspnea, edema, etc...  ~  Myoview 12/05 was neg- no ischemia, no infarct...  ~  EKG 2/11 showed NSR, rate 82/min, WNL.Marland KitchenMarland Kitchen  PERIPHERAL VASCULAR DISEASE (ICD-443.9) - on ASA 81mg /d, PLAVIX 75mg /d... doing well w/o claud symptoms...  ~  Art Dopplers of LE's 6/06 showed biphasic waveforms in ant tib & peroneal  arts, normal ABI's...  VENOUS INSUFFICIENCY (ICD-459.81) - on low sodium diet and the Lasix... she knows to elevate legs, wear support hose, etc...  HYPERLIPIDEMIA (ICD-272.4) - on SIMVASTATIN 40mg /d...  ~  FLP 1/08 showed TChol 121, TG 120, HDL 46, LDL 51... looks great- continue Simva40.  ~  FLP 1/09 showed TChol 137, TG 165, HDL 37, LDL 67  ~  FLP 9/09 showed TChol 133, TG 141, HDL 47, LDL 57  ~  FLP 5/10 showed TChol 131, TG 190, HDL 46, LDL 47  ~  FLP 2/11 showed TChol 129, TG 129, HDL 52, LDL 51  DM (ICD-250.00) -  on diet + METFORMIN 500mg - 1Bid... unable to exercise or lose wt...   ~  labs 1/08 showed BS=130 and HgA1c=6.1  ~  labs 1/09 showed BS= 137, HgA1c= 6.2... diet and exercise are the keys!!!  ~  labs 9/09 showed BS= 119, HgA1c= 6.3  ~  labs 5/10 showed BS= 134, A1c= 6.3  ~  labs 2/11 showed BS= 122, A1c= 6.3  ~  labs 6/11 showed BS= 106  MORBID OBESITY (ICD-278.01) - prev wt 227#  & 5\' 2"  tall for a BMI=41-42...   ~  weight 1/10 = 217#  ~  weight 5/10 = 215#  ~  weight 9/10 = 210#  ~  weight 2/11 = 210#  ~  weight 6/11 = 203#  ~  weight 10/11 = 203#  GERD (ICD-530.81) - pt uses OTC Prilosec Prn... denies CP, dysphagia, reflux, N/ V, etc...  DIVERTICULOSIS, COLON (ICD-562.10) - had colonoscopy by DrDBrodie 1997 that was neg x divertics... now followed in [place]...  IRRITABLE BOWEL SYNDROME (ICD-564.1)  DEGENERATIVE JOINT DISEASE, ADVANCED (ICD-715.90) - she has mod severe bilat knee pain and sees DrHarrison, Ortho for shots "every three months"... she is opposed to surgery (see his EMR notes- reviewed)...  VITAMIN D DEFICIENCY (ICD-268.9)  ~  labs 5/10 showed Vit D level =13... therefore rec Vit D 50000 u weekly.  ~  labs 2/11 showed Vit D level = 32... OK to change to 1000 u daily..  ~  BMD done 6/11 showed TScores -1.3 in Spine, and -2.5 in left FemNeck... rec> start Alendronate 70mg /wk but she refuses Bisphos Rx "I'll take mu=y chances"...  FIBROMYALGIA (ICD-729.1)  ANXIETY (ICD-300.00) - we perscribed HYDROXYZINE PAMOATE 75mg /d Prn for "throat closing" symptoms...  ANEMIA (ICD-285.9) - hx mild anemia w/ Hg = 10.8 Jan09 & started Fe daily at that time...   ~  labs 5/09 showed Hg= 11.7, Fe= 64  ~  labs 9/09 showed Hg= 11.4, Fe= 43... rec- take FeSO4 w/ Vit C 500mg /d.  ~  labs 5/10 showed Hg= 11.3,  Fe= 50  ~  labs 2/11 showed Hg= 10.7, Fe= 36 (12%)... rec> incr Fe Vit C to Bid.  ~  labs 6/11 showed Hg= 10.9, Fe= 58 (19%sat)... continue Bid Fe  INTERMITTENT VERTIGO  (ICD-780.4) - prev eval DrWeymann w/ Meclizine Prn...   Preventive Screening-Counseling & Management  Alcohol-Tobacco     Smoking Status: never smoked  Current Medications (verified): 1)  Aspirin 81 Mg Tabs (Aspirin) .... Take 1 Tablet By Mouth Once A Day 2)  Plavix 75 Mg  Tabs (Clopidogrel Bisulfate) .... Take 1 Tablet By Mouth Once A Day 3)  Metoprolol Tartrate 25 Mg  Tabs (Metoprolol Tartrate) .... Take 1 Tab By Mouth Two Times A Day... 4)  Avapro 150 Mg  Tabs (Irbesartan) .... Take 1 Tablet By Mouth Once A Day 5)  Furosemide  40 Mg Tabs (Furosemide) .... Take 1 Tab By Mouth Once Daily.Marland KitchenMarland Kitchen 6)  Simvastatin 40 Mg  Tabs (Simvastatin) .... Take 1 Tab By Mouth At Bedtime.Marland KitchenMarland Kitchen 7)  Metformin Hcl 500 Mg  Tabs (Metformin Hcl) .... Take 1 Tab By Mouth Two Times A Day... 8)  Meclizine Hcl 25 Mg Tabs (Meclizine Hcl) .... Take 1 Tablet By Mouth Every Four Hours As Needed For Dizziness 9)  Amitriptyline Hcl 10 Mg Tabs (Amitriptyline Hcl) .... Take One Tablet Daily... 10)  Vitamin B-12 1000 Mcg Tabs (Cyanocobalamin) .... Take 1 Tab By Mouth Once Daily... 11)  Ferrous Sulfate 324 Mg  Tbec (Ferrous Sulfate) .... Take 1 Tab By Mouth Two Times A Day 12)  Vitamin C 500 Mg Tabs (Ascorbic Acid) .... Take 1 Tab By Mouth Two Times Daily W/ The Iron Tab... 13)  Vitamin D 1000 Unit Tabs (Cholecalciferol) .... Take 1 Tablet By Mouth Once A Day 14)  Onetouch Ultra Test  Strp (Glucose Blood) .... Test As Directed 15)  Hydroxyzine Pamoate 25 Mg Caps (Hydroxyzine Pamoate) .... Take 1 Cap By Mouth Every 6 H As Needed...  Allergies: 1)  ! Penicillin 2)  ! Codeine 3)  ! Lipitor 4)  ! * Sulfa Drugs 5)  ! Valium  Family History: Reviewed history from 02/21/2008 and no changes required. Father died at age 42 with chf Mother died at age 61 with leukemia pt is an only child  Social History: Reviewed history from 10/23/2008 and no changes required. Widow, husb= Duanne Guess passed away in Nov 05, 2002 1 child- son lives in  [place] never smoked  social alcohol retired  Review of Systems      See HPI       The patient complains of decreased hearing, dyspnea on exertion, peripheral edema, muscle weakness, and difficulty walking.  The patient denies anorexia, fever, weight loss, weight gain, vision loss, hoarseness, chest pain, syncope, prolonged cough, headaches, hemoptysis, abdominal pain, melena, hematochezia, severe indigestion/heartburn, hematuria, incontinence, suspicious skin lesions, transient blindness, depression, unusual weight change, abnormal bleeding, enlarged lymph nodes, and angioedema.    Vital Signs:  Patient profile:   77 year old female Height:      62 inches Weight:      148.13 pounds BMI:     27.19 O2 Sat:      93 % on Room air Temp:     97.6 degrees F oral Pulse rate:   69 / minute BP sitting:   140 / 66  (left arm) Cuff size:   regular  Vitals Entered By: Kandice Hams CMA (March 19, 2010 11:48 AM)  O2 Flow:  Room air CC: 4 month ROV & review of mult medical problems...   Physical Exam  Additional Exam:  WD, Obese, 77 y/o WF in NAD... GENERAL:  Alert & oriented; pleasant & cooperative... she is very talkative... HEENT:  [place]/AT, Glasses, EOM-full, PERRLA, TMs-wax, NOSE-clear, THROAT-clear & wnl. NECK:  Supple w/ fairROM; no JVD; no carotid bruits; no thyromegaly or nodules palpated; no lymphadenopathy. CHEST:  decr BS bilat but clear w/o wheezing, rales or signs of consolidation... HEART:   Regular Rhythm; without murmurs/ rubs/ or gallops detected... ABDOMEN:  Obese w/ panniculus, soft & nontender; normal bowel sounds; no organomegaly or masses palpated. EXT:  mod arthritic changes; no varicose veins/ +venous insuffic/ tr+edema bilat NEURO:  CN's intact, no focal neuro deficits... DERM:  few ecchymoses, no rash etc...    Impression & Recommendations:  Problem # 1:  PULM>>> Breathing is stable &  at baseline... needs better diet & get wt down!!!  Problem # 2:  HYPERTENSION  (ICD-401.9) BP controlled>  same meds. Her updated medication list for this problem includes:    Metoprolol Tartrate 25 Mg Tabs (Metoprolol tartrate) .[name]... Take 1 tab by mouth two times a day...    Avapro 150 Mg Tabs (Irbesartan) .[name]... Take 1 tablet by mouth once a day    Furosemide 40 Mg Tabs (Furosemide) .[name]... Take 1 tab by mouth once daily...  Problem # 3:  PERIPHERAL VASCULAR DISEASE (ICD-443.9) She continues on ASA, Plavix, Low sodium, etc...  Problem # 4:  HYPERLIPIDEMIA (ICD-272.4) Stable on Simva40 + diet efforts... Her updated medication list for this problem includes:    Simvastatin 40 Mg Tabs (Simvastatin) .[name]... Take 1 tab by mouth at bedtime...  Problem # 5:  DM (ICD-250.00) Stable on Metformin & I told her we could decr her med if she could lose weight! Her updated medication list for this problem includes:    Aspirin 81 Mg Tabs (Aspirin) .[name]... Take 1 tablet by mouth once a day    Avapro 150 Mg Tabs (Irbesartan) .[name]... Take 1 tablet by mouth once a day    Metformin Hcl 500 Mg Tabs (Metformin hcl) .[name]... Take 1 tab by mouth two times a day...  Problem # 6:  DIVERTICULOSIS, COLON (ICD-562.10) GI is stable...  Problem # 7:  VITAMIN D DEFICIENCY (ICD-268.9) She has Osteoporosis on BMD 6/11 -2.5 in left [facility] but she flat out refuses meds for her bones... she will use calcium, vits, vit D, etc...  Problem # 8:  OTHER MEDICAL PROBLEMS AS NOTED>>> OK Flu vaccine...  Complete Medication List: 1)  Aspirin 81 Mg Tabs (Aspirin) .... Take 1 tablet by mouth once a day 2)  Plavix 75 Mg Tabs (Clopidogrel bisulfate) .... Take 1 tablet by mouth once a day 3)  Metoprolol Tartrate 25 Mg Tabs (Metoprolol tartrate) .... Take 1 tab by mouth two times a day... 4)  Avapro 150 Mg Tabs (Irbesartan) .... Take 1 tablet by mouth once a day 5)  Furosemide 40 Mg Tabs (Furosemide) .... Take 1 tab by mouth once daily.Marland KitchenMarland Kitchen 6)  Simvastatin 40 Mg Tabs (Simvastatin) .... Take 1 tab by mouth at  bedtime.Marland KitchenMarland Kitchen 7)  Metformin Hcl 500 Mg Tabs (Metformin hcl) .... Take 1 tab by mouth two times a day... 8)  Ferrous Sulfate 324 Mg Tbec (Ferrous sulfate) .... Take 1 tab by mouth two times a day 9)  Vitamin B-12 1000 Mcg Tabs (Cyanocobalamin) .... Take 1 tab by mouth once daily... 10)  Vitamin C 500 Mg Tabs (Ascorbic acid) .... Take 1 tab by mouth two times daily w/ the iron tab... 11)  Vitamin D 1000 Unit Tabs (Cholecalciferol) .... Take 1 tablet by mouth once a day 12)  Amitriptyline Hcl 10 Mg Tabs (Amitriptyline hcl) .... Take one tablet daily... 13)  Hydroxyzine Pamoate 25 Mg Caps (Hydroxyzine pamoate) .... Take 1 cap by mouth every 6 h as needed... 14)  Meclizine Hcl 25 Mg Tabs (Meclizine hcl) .... Take 1 tablet by mouth every four hours as needed for dizziness 15)  Onetouch Ultra Test Strp (Glucose blood) .... Test as directed  Patient Instructions: 1)  Today we updated your med list- see below.... 2)  Continue your current meds the same... 3)  Stay as active as possible... 4)  Call for any problems.Marland KitchenMarland Kitchen 5)  Please schedule a follow-up appointment in 4 months, & we will plan FASTING blood work at that  time...   Influenza Vaccine    Vaccine Type: Fluvax MCR    Site: left deltoid    Mfr: GlaxoSmithKline    Dose: 0.5 ml    Route: IM    Given by: Kandice Hams CMA    Exp. Date: 11/29/2010    Lot #: [id number]    VIS given: 12/24/09 version given March 19, 2010.  Flu Vaccine Consent Questions    Do you have a history of severe allergic reactions to this vaccine? no    Any prior history of allergic reactions to egg and/or gelatin? no    Do you have a sensitivity to the preservative Thimersol? no    Do you have a past history of Guillan-Barre Syndrome? no    Do you currently have an acute febrile illness? no    Have you ever had a severe reaction to latex? no    Vaccine information given and explained to patient? yes    Are you currently pregnant? no

## 2010-07-03 NOTE — Assessment & Plan Note (Signed)
Summary: 3 M RE-CK KNEE/POSS INJEC/MEDICARE,BL.IFE/CAF   Visit Type:  Follow-up Referring Provider:  self  CC:  recheck knees.  History of Present Illness: I saw Alexandra [name] in the office today for a followup visit.  She is a 77 years old woman with the complaint of:  knee pain, bilateral OA.  Requests injections today.  Last injections both knees was 11/27/09.  Right knee hurting worse than left.  Had Bone density test, had to turn her toes in pigeon toed and cant stand good on her leg since then.  Osteoporosis bilateral hips, told needs Fosamax, has not taken, does not want to take.  XS Tylenol for pain      Allergies: 1)  ! Penicillin 2)  ! Codeine 3)  ! Lipitor 4)  ! * Sulfa Drugs 5)  ! Valium   Impression & Recommendations:  Problem # 1:  KNEE, ARTHRITIS, DEGEN./OSTEO (ICD-715.96) Assessment Deteriorated  INJECT BOTH KNEES as follows:   Verbal consent was obtained. The knee was prepped with alcohol and ethyl chloride. 1 cc of depomedrol 40mg /cc and 4 cc of lidocaine 1% was injected. there were no complications.  Her updated medication list for this problem includes:    Aspirin 81 Mg Tabs (Aspirin) .[name]... Take 1 tablet by mouth once a day  Orders: Joint Aspirate / Injection, Large (20610) Depo- Medrol 40mg  (J1030)  Patient Instructions: 1)  You have received an injection of cortisone today. You may experience increased pain at the injection site. Apply ice pack to the area for 20 minutes every 2 hours and take 2 xtra strength tylenol every 8 hours. This increased pain will usually resolve in 24 hours. The injection will take effect in 3-10 days.

## 2010-07-03 NOTE — Assessment & Plan Note (Signed)
Summary: 5 M RE-CK KNEE/REQ INJECTION/MEDICARE/B.LIFE/CAF   Visit Type:  Follow-up  CC:  knee pain.  Allergies: 1)  ! Penicillin 2)  ! Codeine 3)  ! Lipitor 4)  ! * Sulfa Drugs 5)  ! Valium   Impression & Recommendations:  Problem # 1:  KNEE, ARTHRITIS, DEGEN./OSTEO (ICD-715.96) Assessment Deteriorated  Her updated medication list for this problem includes:    Aspirin 81 Mg Tabs (Aspirin) .[name]... Take 1 tablet by mouth once a day  Orders: Joint Aspirate / Injection, Large (20610)bilateral knee injections RIGHT leg first the LEFT as follows Verbal consent was obtained. The knee was prepped with alcohol and ethyl chloride. 1 cc of depomedrol 40mg /cc and 4 cc of lidocaine 1% was injected. there were no complications.   Depo- Medrol 40mg  (J1030)  Patient Instructions: 1)  Please schedule a follow-up appointment as needed. 2)  You have received an injection of cortisone today. You may experience increased pain at the injection site. Apply ice pack to the area for 20 minutes every 2 hours and take 2 xtra strength tylenol every 8 hours. This increased pain will usually resolve in 24 hours. The injection will take effect in 3-10 days.

## 2010-07-15 ENCOUNTER — Telehealth: Payer: Self-pay | Admitting: Pulmonary Disease

## 2010-07-22 ENCOUNTER — Ambulatory Visit (INDEPENDENT_AMBULATORY_CARE_PROVIDER_SITE_OTHER)
Admission: RE | Admit: 2010-07-22 | Discharge: 2010-07-22 | Disposition: A | Discharge status: Home / Self Care | Payer: Medicare Other | Source: Ambulatory Visit | Attending: Pulmonary Disease | Admitting: Pulmonary Disease

## 2010-07-22 ENCOUNTER — Other Ambulatory Visit: Payer: Medicare Other

## 2010-07-22 ENCOUNTER — Other Ambulatory Visit: Payer: Self-pay | Admitting: Pulmonary Disease

## 2010-07-22 ENCOUNTER — Ambulatory Visit (INDEPENDENT_AMBULATORY_CARE_PROVIDER_SITE_OTHER): Payer: Medicare Other | Admitting: Pulmonary Disease

## 2010-07-22 ENCOUNTER — Encounter: Payer: Self-pay | Admitting: Pulmonary Disease

## 2010-07-22 ENCOUNTER — Other Ambulatory Visit: Payer: Self-pay | Admitting: Internal Medicine

## 2010-07-22 ENCOUNTER — Telehealth: Payer: Self-pay | Admitting: Pulmonary Disease

## 2010-07-22 DIAGNOSIS — D649 Anemia, unspecified: Secondary | ICD-10-CM

## 2010-07-22 DIAGNOSIS — D869 Sarcoidosis, unspecified: Secondary | ICD-10-CM

## 2010-07-22 DIAGNOSIS — K219 Gastro-esophageal reflux disease without esophagitis: Secondary | ICD-10-CM

## 2010-07-22 DIAGNOSIS — E119 Type 2 diabetes mellitus without complications: Secondary | ICD-10-CM

## 2010-07-22 DIAGNOSIS — E559 Vitamin D deficiency, unspecified: Secondary | ICD-10-CM

## 2010-07-22 DIAGNOSIS — E785 Hyperlipidemia, unspecified: Secondary | ICD-10-CM

## 2010-07-22 DIAGNOSIS — K589 Irritable bowel syndrome without diarrhea: Secondary | ICD-10-CM

## 2010-07-22 DIAGNOSIS — J209 Acute bronchitis, unspecified: Secondary | ICD-10-CM

## 2010-07-22 DIAGNOSIS — F411 Generalized anxiety disorder: Secondary | ICD-10-CM

## 2010-07-22 DIAGNOSIS — I10 Essential (primary) hypertension: Secondary | ICD-10-CM

## 2010-07-22 DIAGNOSIS — M1990 Unspecified osteoarthritis, unspecified site: Secondary | ICD-10-CM

## 2010-07-22 DIAGNOSIS — R002 Palpitations: Secondary | ICD-10-CM

## 2010-07-22 DIAGNOSIS — I872 Venous insufficiency (chronic) (peripheral): Secondary | ICD-10-CM

## 2010-07-22 DIAGNOSIS — I739 Peripheral vascular disease, unspecified: Secondary | ICD-10-CM

## 2010-07-22 DIAGNOSIS — R52 Pain, unspecified: Secondary | ICD-10-CM

## 2010-07-22 LAB — CBC WITH DIFFERENTIAL/PLATELET
Basophils Relative: 0.4 % (ref 0.0–3.0)
Eosinophils Absolute: 0.3 10*3/uL (ref 0.0–0.7)
Hemoglobin: 11.6 g/dL — ABNORMAL LOW (ref 12.0–15.0)
Lymphocytes Relative: 12.3 % (ref 12.0–46.0)
Monocytes Relative: 4 % (ref 3.0–12.0)
Neutro Abs: 6.3 10*3/uL (ref 1.4–7.7)
Neutrophils Relative %: 80 % — ABNORMAL HIGH (ref 43.0–77.0)
RBC: 3.76 Mil/uL — ABNORMAL LOW (ref 3.87–5.11)
WBC: 7.9 10*3/uL (ref 4.5–10.5)

## 2010-07-22 LAB — TSH: TSH: 3.01 u[IU]/mL (ref 0.35–5.50)

## 2010-07-23 LAB — IBC PANEL
Saturation Ratios: 16.3 % — ABNORMAL LOW (ref 20.0–50.0)
Transferrin: 223.8 mg/dL (ref 212.0–360.0)

## 2010-07-23 LAB — LIPID PANEL
HDL: 49.3 mg/dL (ref >39.00)
Total CHOL/HDL Ratio: 3
Triglycerides: 159 mg/dL — ABNORMAL HIGH (ref 0.0–149.0)
VLDL: 31.8 mg/dL (ref 0.0–40.0)

## 2010-07-23 LAB — HEPATIC FUNCTION PANEL
ALT: 10 U/L (ref 0–35)
Albumin: 4.3 g/dL (ref 3.5–5.2)
Total Protein: 7.2 g/dL (ref 6.0–8.3)

## 2010-07-23 LAB — BASIC METABOLIC PANEL
BUN: 29 mg/dL — ABNORMAL HIGH (ref 6–23)
CO2: 29 mEq/L (ref 19–32)
Calcium: 9.2 mg/dL (ref 8.4–10.5)
Chloride: 101 mEq/L (ref 96–112)
Creatinine, Ser: 1.1 mg/dL (ref 0.4–1.2)
Glucose, Bld: 138 mg/dL — ABNORMAL HIGH (ref 70–99)

## 2010-07-23 NOTE — Progress Notes (Signed)
  Phone Note Refill Request Message from:  Fax from Pharmacy  Refills Requested: Medication #1:  PLAVIX 75 MG  TABS Take 1 tablet by mouth once a day   Supply Requested: 1 month    Prescriptions: PLAVIX 75 MG  TABS (CLOPIDOGREL BISULFATE) Take 1 tablet by mouth once a day  #30 x 5   Entered by:   Zackery Barefoot CMA   Authorized by:   [name] MD   Signed by:   Zackery Barefoot CMA on 07/15/2010   Method used:   Faxed to ...       [facility]* (retail)       [address]       [place], [place]  [address]       Ph: [phone]       Fax: [phone]   RxID:   [id number]

## 2010-07-29 NOTE — Progress Notes (Signed)
Summary: cardio referrall  Phone Note Call from Patient Call back at Home Phone [phone]   Caller: Patient Call For: Burgess Sheriff Summary of Call: pt says someone called her. she thinks this is regarding a referral to cardiology. says it may have been rhonda but no call back # was given. call home # or cell [phone] Initial call taken by: Tivis Ringer, CNA,  July 22, 2010 4:30 PM  Follow-up for Phone Call        Looks lke the [facility] called the patient-will forward message to [facility] box for them to finish order with patient.Reynaldo Minium CMA  July 22, 2010 4:36 PM  Attempted to return pt's call and line is busy. will try again later. Appt scheduled for Wed. 08/06/10 at 10:30 to see Dr. Antoine Poche. This is the first available. I have tried [facility] in [place], [place] and [address].  Returned pt's call and advised her of the appt to see Dr. Antoine Poche on Wed. 08/06/10 at 10:30 at [facility] in [place]. Pt is aware of appt date, time and location. Rhonda Cobb  July 23, 2010 9:12 AM

## 2010-08-06 ENCOUNTER — Ambulatory Visit (INDEPENDENT_AMBULATORY_CARE_PROVIDER_SITE_OTHER): Payer: Medicare Other | Admitting: Cardiology

## 2010-08-06 ENCOUNTER — Encounter: Payer: Self-pay | Admitting: Cardiology

## 2010-08-06 DIAGNOSIS — R002 Palpitations: Secondary | ICD-10-CM

## 2010-08-12 NOTE — Assessment & Plan Note (Signed)
Summary: np6/palp/per rhonda ext 704 notes on file-mb   Visit Type:  Initial Consult Primary Provider:  Dr. Alroy Dust  CC:  palpitations.  History of Present Illness: The patient has no prior cardiac history. However, for the last 2 months she has been having a "throbbing" in her throat when she exerts herself. This has been with activities such as going to the grocery store or making the bed. She does not describe chest or arm discomfort. She is not feeling her heart racing or skipping. I reviewed the EKG and she clearly has atrial ectopy though she is not complaining of symptoms.  She has had no presyncope or syncope. She has had no new shortness of breath, PND or orthopnea. I did review recent labs and her TSH was normal. She has had no prior cardiac workup.  Current Medications (verified): 1)  Aspirin 81 Mg Tabs (Aspirin) .... Take 1 Tablet By Mouth Once A Day 2)  Plavix 75 Mg  Tabs (Clopidogrel Bisulfate) .... Take 1 Tablet By Mouth Once A Day 3)  Metoprolol Tartrate 25 Mg  Tabs (Metoprolol Tartrate) .... Take 1 Tab By Mouth Two Times A Day... 4)  Avapro 150 Mg  Tabs (Irbesartan) .... Take 1 Tablet By Mouth Once A Day 5)  Furosemide 40 Mg Tabs (Furosemide) .... Take 1 Tab By Mouth Once Daily.Marland KitchenMarland Kitchen 6)  Simvastatin 40 Mg  Tabs (Simvastatin) .... Take 1 Tab By Mouth At Bedtime.Marland KitchenMarland Kitchen 7)  Metformin Hcl 500 Mg  Tabs (Metformin Hcl) .... Take 1 Tab By Mouth Two Times A Day... 8)  Ferrous Sulfate 324 Mg  Tbec (Ferrous Sulfate) .... Take 1 Tab By Mouth Two Times A Day 9)  Vitamin B-12 1000 Mcg Tabs (Cyanocobalamin) .... Take 1 Tab By Mouth 3 Times A Week 10)  Vitamin C 500 Mg Tabs (Ascorbic Acid) .... Take 1 Tab By Mouth Two Times Daily W/ The Iron Tab... 11)  Vitamin D 1000 Unit Tabs (Cholecalciferol) .... Take 1 Tablet By Mouth Once A Day 12)  Amitriptyline Hcl 10 Mg Tabs (Amitriptyline Hcl) .... Take One Tablet Daily... 13)  Hydroxyzine Pamoate 25 Mg Caps (Hydroxyzine Pamoate) .... Take 1 Cap By  Mouth Every 6 H As Needed... 14)  Meclizine Hcl 25 Mg Tabs (Meclizine Hcl) .... Take 1 Tablet By Mouth Every Four Hours As Needed For Dizziness 15)  Onetouch Ultra Test  Strp (Glucose Blood) .... Test As Directed  Allergies (verified): 1)  ! Penicillin 2)  ! Codeine 3)  ! Lipitor 4)  ! * Sulfa Drugs 5)  ! Valium  Past History:  Past Medical History: Reviewed history from 08/05/2010 and no changes required. HYPERTENSION PERIPHERAL VASCULAR DISEASE VENOUS INSUFFICIENCY HYPERLIPIDEMIA PALPITATIONS  BURSITIS, HIP  SUBLUXATION PATELLAR (MALALIGNMENT) KNEE, ARTHRITIS, DEGEN./OSTEO  HIP PAIN  ANSERINE BURSITIS KNEE PAIN Hx of ASTHMATIC BRONCHITIS, ACUTE  Hx of SARCOIDOSIS  DM MORBID OBESITY GERD DIVERTICULOSIS, COLON IRRITABLE BOWEL SYNDROME  DEGENERATIVE JOINT DISEASE, ADVANCED  VITAMIN D DEFICIENCY  FIBROMYALGIA ANXIETY ANEMIA INTERMITTENT VERTIGO   Past Surgical History: Cholecystectomy Tonsillectomy Hernia Cataract surgery  Review of Systems       Positive for constipation, joint pains. Otherwise as stated in the history of present illness negative for all other systems.  Vital Signs:  Patient profile:   77 year old female Height:      62 inches Weight:      196 pounds BMI:     35.98 Pulse rate:   74 / minute Pulse rhythm:   irregular  BP sitting:   128 / 64  (left arm) Cuff size:   large  Vitals Entered By: Judithe Modest CMA (August 06, 2010 10:34 AM)  Physical Exam  General:  Well developed, well nourished, in no acute distress. Head:  normocephalic and atraumatic Eyes:  PERRLA/EOM intact; conjunctiva and lids normal. Neck:  Neck supple, no JVD. No masses, thyromegaly or abnormal cervical nodes. Chest Wall:  no deformities or breast masses noted Lungs:  Clear bilaterally to auscultation and percussion. Abdomen:  Bowel sounds positive; abdomen soft and non-tender without masses, organomegaly, or hernias noted. No hepatosplenomegaly. Msk:  Back  normal, normal gait. Muscle strength and tone normal. Extremities:  No clubbing or cyanosis. Neurologic:  Alert and oriented x 3. Skin:  Intact without lesions or rashes. Cervical Nodes:  no significant adenopathy Axillary Nodes:  no significant adenopathy Psych:  Normal affect.   Detailed Cardiovascular Exam  Neck    Carotids: Carotids full and equal bilaterally without bruits.      Neck Veins: Normal, no JVD.    Heart    Inspection: no deformities or lifts noted.      Palpation: normal PMI with no thrills palpable.      Auscultation: regular rate and rhythm, S1, S2 without murmurs, rubs, gallops, or clicks.    Vascular    Abdominal Aorta: no palpable masses, pulsations, or audible bruits.      Femoral Pulses: normal femoral pulses bilaterally.      Pedal Pulses: normal pedal pulses bilaterally.      Radial Pulses: normal radial pulses bilaterally.      Peripheral Circulation: no clubbing, cyanosis, or edema noted with normal capillary refill.     EKG  Procedure date:  08/06/2010  Findings:      sinus rhythm with multifocal premature atrial contractions are, probable Mobitz type I  Impression & Recommendations:  Problem # 1:  PALPITATIONS (ICD-785.1) While her symptoms could be an anginal I. more strongly suspect her an arrhythmia given the EKG. I will start with an event monitor to evaluate this very Orders: EKG w/ Interpretation (93000) Event (Event)  Problem # 2:  HYPERTENSION (ICD-401.9) Her blood pressure is well controlled.  Problem # 3:  HYPERLIPIDEMIA (ICD-272.4) I reviewed her lipids and she has excellent levels.  She will continue the meds as listed.  Patient Instructions: 1)  Your physician has recommended that you wear an event monitor.  Event monitors are medical devices that record the heart's electrical activity. Doctors most often use these monitors to diagnose arrhythmias. Arrhythmias are problems with the speed or rhythm of the heartbeat. The  monitor is a small, portable device. You can wear one while you do your normal daily activities. This is usually used to diagnose what is causing palpitations/syncope (passing out).

## 2010-08-12 NOTE — Assessment & Plan Note (Signed)
Summary: rov 4 months/kp   CC:  4 month ROV & review....  History of Present Illness: 77 y/o WF here for a 4 month follow up visit... he has multiple medical problems including AB, hx inactive Sarcoid, HBP, PVD, VI, Hyperlipidemia, DM, Obesity, GERD/ Divertics/ IBS, DJD/ FM/ osteop, Anxiety, Anemia...   ~  March 19, 2010:  4 mo ROV & she notes stable- Hydroxyzine helps "throat" episodes that occur w/ anxiety; no intercurrent infections;  BP stable on meds;  still too sedentary & not effectively dieting (wt w/o change);  she had BMD 6/11= osteoporotic in left FemNeck & Alendronate recommended to her but she flat out refuses to take the Bisphos Rx "I'll take my chances"...   ~  July 22, 2010:  59mo ROV & weight is down 13# on diet;  she's had occas episodes of palpit, heart racing & notes "throat closing" sensation when this occurs, noted w/ activity & valsalva maneuver eg bending down & assoc w/ SOB but denies CP;  EKG w/ atrial arrhythmias/ PACs & we discussed need for further Cards eval- she requests the [facility] office;  she is currently on ASA, Plavix, Metoprolol 25mg Bid, Avapro 150mg /d, Lasix 40mg /d;  BP well controlled on this...    She has hx old burnt-out Sarcoid> yearly CXR today shows mild cardiomeg, tort Ao, chr incr markings, NAD;  denies cough, sput, chr in her baseline SOB/ DOE, etc...    Her FLP looks good on Simva40;  DM control is excellent on Metformin Bid;  weight down to 190# as noted;  GI appears stable & is followed in [place] now;  Ortho per DrSHarrison in [place] w/ shots every 44mo or so!;  hx Anemia & Hg improved to 11.6 today, Fe=51, Sat=16%, B12 is high;  she uses Hydroxyzine (Vistaril) 25mg  Prn for nerves...   Current Problem List:  Hx of ASTHMATIC BRONCHITIS, ACUTE (ICD-466.0) - uses Mucinex as needed... she has chr stable DOE w/o change... she say she walks "some" but is obviously too sedentary due to her arthritis... she has PNEUMOVAX several yrs ago.  Hx of  SARCOIDOSIS (ICD-135) - initially diagnosed in 1992 w/ CXR showing diffuse ILDis, +Gallium scan, ACE=61, & Bronch w/ bx showing non-caseating granulomas... treated w/ Pred & weaned off w/ no active disease since then... last CTChest 7/04 w/ ca++ Ao & coronaries, +mediastinal fat, centilobular emphysema, some scarring... stable without new symptoms of cough, sputum, CP or dyspnea...  ~  f/u CXR 1/09 showed stable chr lung changes & prom right hilum...  ~  f/u CXR 2/11 showed diffuse ILDz, no adenopathy, NAD...  HYPERTENSION (ICD-401.9) - controlled on meds: METOPROLOL 25mg Bid, AVAPRO 150mg /d, LASIX 40mg /d... BP= 140/66 today, tol Rx well...  denies HA, visual changes, CP, palipit, syncope, change in dyspnea, edema, etc...  ~  Myoview 12/05 was neg- no ischemia, no infarct...  ~  EKG 2/11 showed NSR, rate 82/min, WNL.Marland KitchenMarland Kitchen  PERIPHERAL VASCULAR DISEASE (ICD-443.9) - on ASA 81mg /d, PLAVIX 75mg /d... doing well w/o claud symptoms...  ~  Art Dopplers of LE's 6/06 showed biphasic waveforms in ant tib & peroneal arts, normal ABI's...  VENOUS INSUFFICIENCY (ICD-459.81) - on low sodium diet and the Lasix... she knows to elevate legs, wear support hose, etc...  HYPERLIPIDEMIA (ICD-272.4) - on SIMVASTATIN 40mg /d...  ~  FLP 1/08 showed TChol 121, TG 120, HDL 46, LDL 51... looks great- continue Simva40.  ~  FLP 1/09 showed TChol 137, TG 165, HDL 37, LDL 67  ~  FLP 9/09 showed  TChol 133, TG 141, HDL 47, LDL 57  ~  FLP 5/10 showed TChol 131, TG 190, HDL 46, LDL 47  ~  FLP 2/11 showed TChol 129, TG 129, HDL 52, LDL 51  DM (ICD-250.00) - on diet + METFORMIN 500mg - 1Bid... unable to exercise or lose wt...   ~  labs 1/08 showed BS=130 and HgA1c=6.1  ~  labs 1/09 showed BS= 137, HgA1c= 6.2... diet and exercise are the keys!!!  ~  labs 9/09 showed BS= 119, HgA1c= 6.3  ~  labs 5/10 showed BS= 134, A1c= 6.3  ~  labs 2/11 showed BS= 122, A1c= 6.3  ~  labs 6/11 showed BS= 106  MORBID OBESITY (ICD-278.01) - prev wt  227#  & 5\' 2"  tall for a BMI=41-42...   ~  weight 1/10 = 217#  ~  weight 5/10 = 215#  ~  weight 9/10 = 210#  ~  weight 2/11 = 210#  ~  weight 6/11 = 203#  ~  weight 10/11 = 203#  GERD (ICD-530.81) - pt uses OTC Prilosec Prn... denies CP, dysphagia, reflux, N/ V, etc...  DIVERTICULOSIS, COLON (ICD-562.10) - had colonoscopy by DrDBrodie 1997 that was neg x divertics... now followed in [place]...  IRRITABLE BOWEL SYNDROME (ICD-564.1)  DEGENERATIVE JOINT DISEASE, ADVANCED (ICD-715.90) - she has mod severe bilat knee pain and sees DrHarrison, Ortho for shots "every three months"... she is opposed to surgery (see his EMR notes- reviewed)...  VITAMIN D DEFICIENCY (ICD-268.9)  ~  labs 5/10 showed Vit D level =13... therefore rec Vit D 50000 u weekly.  ~  labs 2/11 showed Vit D level = 32... OK to change to 1000 u daily..  ~  BMD done 6/11 showed TScores -1.3 in Spine, and -2.5 in left FemNeck... rec> start Alendronate 70mg /wk but she refuses Bisphos Rx "I'll take mu=y chances"...  FIBROMYALGIA (ICD-729.1)  ANXIETY (ICD-300.00) - we perscribed HYDROXYZINE PAMOATE 75mg /d Prn for "throat closing" symptoms...  ANEMIA (ICD-285.9) - hx mild anemia w/ Hg = 10.8 Jan09 & started Fe daily at that time...   ~  labs 5/09 showed Hg= 11.7, Fe= 64  ~  labs 9/09 showed Hg= 11.4, Fe= 43... rec- take FeSO4 w/ Vit C 500mg /d.  ~  labs 5/10 showed Hg= 11.3,  Fe= 50  ~  labs 2/11 showed Hg= 10.7, Fe= 36 (12%)... rec> incr Fe Vit C to Bid.  ~  labs 6/11 showed Hg= 10.9, Fe= 58 (19%sat)... continue Bid Fe  INTERMITTENT VERTIGO (ICD-780.4) - prev eval DrWeymann w/ Meclizine Prn...   Preventive Screening-Counseling & Management  Alcohol-Tobacco     Smoking Status: never smoked  Allergies: 1)  ! Penicillin 2)  ! Codeine 3)  ! Lipitor 4)  ! * Sulfa Drugs 5)  ! Valium  Comments:  Nurse/Medical Assistant: The patient's medications and allergies were reviewed with the patient and were updated in  the Medication and Allergy Lists.  Past History:  Past Medical History: Hx of ASTHMATIC BRONCHITIS, ACUTE (ICD-466.0) Hx of SARCOIDOSIS (ICD-135) HYPERTENSION (ICD-401.9) PERIPHERAL VASCULAR DISEASE (ICD-443.9) VENOUS INSUFFICIENCY (ICD-459.81) HYPERLIPIDEMIA (ICD-272.4) DM (ICD-250.00) MORBID OBESITY (ICD-278.01) GERD (ICD-530.81) DIVERTICULOSIS, COLON (ICD-562.10) IRRITABLE BOWEL SYNDROME (ICD-564.1) DEGENERATIVE JOINT DISEASE, ADVANCED (ICD-715.90) VITAMIN D DEFICIENCY (ICD-268.9) FIBROMYALGIA (ICD-729.1) ANXIETY (ICD-300.00) ANEMIA (ICD-285.9) INTERMITTENT VERTIGO (ICD-780.4)  Past Surgical History: Cholecystectomy Tonsillectomy Hernia S/P cataract surgery  Family History: Reviewed history from 02/21/2008 and no changes required. Father died at age 21 with chf Mother died at age 27 with leukemia pt is an only  child  Social History: Reviewed history from 10/23/2008 and no changes required. Widow, husb= Duanne Guess passed away in 10/31/2002 1 child- son lives in [place] (his wife is an ?MD doing Al[employer] research) never smoked  social alcohol retired  Review of Systems      See HPI  Vital Signs:  Patient profile:   77 year old female Height:      62 inches Weight:      190 pounds BMI:     34.88 O2 Sat:      98 % on Room air Temp:     97.6 degrees F oral Pulse rate:   142 / minute BP sitting:   136 / 78  (left arm) Cuff size:   regular  Vitals Entered By: Randell Loop CMA (July 22, 2010 11:30 AM)  O2 Sat at Rest %:  98 O2 Flow:  Room air  Reason for Visit rechecked HR after pt rested HR back down to 89 and rechecked HR again and HR in the range of 77-89 CC: 4 month ROV & review...   Physical Exam  Additional Exam:  WD, Obese, 77 y/o WF in NAD... GENERAL:  Alert & oriented; pleasant & cooperative... she is very talkative... HEENT:  [place]/AT, Glasses, EOM-full, PERRLA, TMs-wax, NOSE-clear, THROAT-clear & wnl. NECK:  Supple w/ fairROM; no JVD; no carotid bruits; no  thyromegaly or nodules palpated; no lymphadenopathy. CHEST:  decr BS bilat but clear w/o wheezing, rales or signs of consolidation... HEART:   sl irregular rhythm; without murmurs/ rubs/ or gallops detected... ABDOMEN:  Obese w/ panniculus, soft & nontender; normal bowel sounds; no organomegaly or masses palpated. EXT:  mod arthritic changes; no varicose veins/ +venous insuffic/ tr+edema bilat NEURO:  CN's intact, no focal neuro deficits... DERM:  few ecchymoses, no rash etc...    Impression & Recommendations:  Problem # 1:  PALPITATIONS (ICD-785.1) These episodes are becoming more freq & symptomatic... we discussed pos Holter monitor etc but decided to pursue Cardiac eval... continue current meds for now... Her updated medication list for this problem includes:    Metoprolol Tartrate 25 Mg Tabs (Metoprolol tartrate) .[name]... Take 1 tab by mouth two times a day...  Orders: 12 Lead EKG (12 Lead EKG) >> PACs, no acute changes... T-Vitamin D (25-Hydroxy) [phone]) >> 77 Cardiology Referral (Cardiology) >> to see DrHochrein 3/7... TLB-BMP (Basic Metabolic Panel-BMET) (80048-METABOL) >> OK TLB-Hepatic/Liver Function Pnl (80076-HEPATIC) >> WNL TLB-CBC Platelet - w/Differential (85025-CBCD) >> Hg11.6 TLB-Lipid Panel (80061-LIPID) >> OK on Simva40 TLB-TSH (Thyroid Stimulating Hormone) (84443-TSH) >> WNL TLB-A1C / Hgb A1C (Glycohemoglobin) (83036-A1C) >> BS138, A1c6.3 TLB-IBC Pnl (Iron/FE;Transferrin) (83550-IBC) TLB-B12, Serum-Total ONLY (43329-J18) >> B12>1300  Problem # 2:  Hx of SARCOIDOSIS (ICD-135) She has old burnt-out sarcoid & today's yearly f/u CXR is stable, NAD... Orders: T-2 View CXR (71020TC) >> sl cardiomeg, tort Ao, chr incr markings, NAD...  Problem # 3:  HYPERTENSION (ICD-401.9) BP controlled on meds>  continue same for now... Her updated medication list for this problem includes:    Metoprolol Tartrate 25 Mg Tabs (Metoprolol tartrate) .[name]... Take 1 tab by mouth two  times a day...    Avapro 150 Mg Tabs (Irbesartan) .[name]... Take 1 tablet by mouth once a day    Furosemide 40 Mg Tabs (Furosemide) .[name]... Take 1 tab by mouth once daily...  Problem # 4:  HYPERLIPIDEMIA (ICD-272.4) FLP looks reasonable on her diet w/ wt reduction, and simva40>  continue same. Her updated medication list for this problem includes:  Simvastatin 40 Mg Tabs (Simvastatin) .[name]... Take 1 tab by mouth at bedtime...  Problem # 5:  DM (ICD-250.00) DM control is good w/ diet + Metformin monotherapy... Her updated medication list for this problem includes:    Aspirin 81 Mg Tabs (Aspirin) .[name]... Take 1 tablet by mouth once a day    Avapro 150 Mg Tabs (Irbesartan) .[name]... Take 1 tablet by mouth once a day    Metformin Hcl 500 Mg Tabs (Metformin hcl) .[name]... Take 1 tab by mouth two times a day...  Problem # 6:  GERD (ICD-530.81) She tells me that GI follows her in [place]...  Problem # 7:  DEGENERATIVE JOINT DISEASE, ADVANCED (ICD-715.90) Followed by DrHarrison in [place]...  Her updated medication list for this problem includes:    Aspirin 81 Mg Tabs (Aspirin) .[name]... Take 1 tablet by mouth once a day  Problem # 8:  OTHER MEDICAL PROBLEMS AS NOTED>>>  Complete Medication List: 1)  Aspirin 81 Mg Tabs (Aspirin) .... Take 1 tablet by mouth once a day 2)  Plavix 75 Mg Tabs (Clopidogrel bisulfate) .... Take 1 tablet by mouth once a day 3)  Metoprolol Tartrate 25 Mg Tabs (Metoprolol tartrate) .... Take 1 tab by mouth two times a day... 4)  Avapro 150 Mg Tabs (Irbesartan) .... Take 1 tablet by mouth once a day 5)  Furosemide 40 Mg Tabs (Furosemide) .... Take 1 tab by mouth once daily.Marland KitchenMarland Kitchen 6)  Simvastatin 40 Mg Tabs (Simvastatin) .... Take 1 tab by mouth at bedtime.Marland KitchenMarland Kitchen 7)  Metformin Hcl 500 Mg Tabs (Metformin hcl) .... Take 1 tab by mouth two times a day... 8)  Ferrous Sulfate 324 Mg Tbec (Ferrous sulfate) .... Take 1 tab by mouth two times a day 9)  Vitamin B-12 1000 Mcg Tabs (Cyanocobalamin) .... Take 1  tab by mouth once daily... 10)  Vitamin C 500 Mg Tabs (Ascorbic acid) .... Take 1 tab by mouth two times daily w/ the iron tab... 11)  Vitamin D 1000 Unit Tabs (Cholecalciferol) .... Take 1 tablet by mouth once a day 12)  Amitriptyline Hcl 10 Mg Tabs (Amitriptyline hcl) .... Take one tablet daily... 13)  Hydroxyzine Pamoate 25 Mg Caps (Hydroxyzine pamoate) .... Take 1 cap by mouth every 6 h as needed... 14)  Meclizine Hcl 25 Mg Tabs (Meclizine hcl) .... Take 1 tablet by mouth every four hours as needed for dizziness 15)  Onetouch Ultra Test Strp (Glucose blood) .... Test as directed  Patient Instructions: 1)  Today we updated your med list- see below.... 2)  Continue your current meds the same... 3)  We will arrange for a Cardiology consult in the [facility] facility.Marland KitchenMarland Kitchen 4)  Today we did your follow up CXR, EKG, & FASSTING blood work... please call the "phone tree" in a few days for your lab results.Marland KitchenMarland Kitchen 5)  Take it easy & don't over exert until after the Cardiology evaluation.Marland KitchenMarland Kitchen 6)  Call for any questions.Marland KitchenMarland Kitchen 7)  Please schedule a follow-up appointment in 4 months.

## 2010-08-20 ENCOUNTER — Encounter: Payer: Self-pay | Admitting: Orthopedic Surgery

## 2010-08-22 ENCOUNTER — Ambulatory Visit: Payer: Medicare Other | Admitting: Cardiology

## 2010-09-10 ENCOUNTER — Ambulatory Visit (INDEPENDENT_AMBULATORY_CARE_PROVIDER_SITE_OTHER): Payer: Medicare Other | Admitting: Orthopedic Surgery

## 2010-09-10 DIAGNOSIS — M1710 Unilateral primary osteoarthritis, unspecified knee: Secondary | ICD-10-CM

## 2010-09-10 DIAGNOSIS — IMO0002 Reserved for concepts with insufficient information to code with codable children: Secondary | ICD-10-CM

## 2010-09-10 MED ORDER — METHYLPREDNISOLONE ACETATE 40 MG/ML IJ SUSP: 40.0000 mg | Freq: Once | INTRAMUSCULAR | Status: DC

## 2010-09-10 NOTE — Progress Notes (Signed)
Bilateral knee pain.  77 year old female with bilateral knee osteoarthritis has declined surgery. She has some heart disease. Resides back for bilateral knee injections. She did have some LEFT hip pain, which radiated down to her ankle from the gluteal area, which is not as symptomatic at this time.  She is currently taking some Tylenol extra strength for pain.  Past Surgical History  Procedure Date  . Cholecystectomy   . Tonsillectomy   . Hernia repair   . Cataract extraction    Past Surgical History  Procedure Date  . Cholecystectomy   . Tonsillectomy   . Hernia repair   . Cataract extraction    Bilateral knee examination is show no joint effusion. She does have lateral joint line tenderness. She can flex her knees. Past 90. There are mild flexion contractures, less than 5.  Both knees are stable. She ambulates with assistive device but ambulates well.  Knees are strong terms of the quadriceps.  Impression bilateral knee osteoarthritis. Plan bilateral knee injections.

## 2010-09-10 NOTE — Patient Instructions (Signed)
You have received a steroid shot. 15% of patients experience increased pain at the injection site with in the next 24 hours. This is best treated with ice and tylenol extra strength 2 tabs every 8 hours. If you are still having pain please call the office.    

## 2010-09-12 ENCOUNTER — Telehealth: Payer: Self-pay | Admitting: *Deleted

## 2010-09-12 NOTE — Telephone Encounter (Signed)
Patient notified of results of monitor per Dr. Hiram Comber Ectopy and questionable short round of atrial fibrillation.  Scheduled appt. with him for 10/28/2010 at 2:45pm.

## 2010-09-16 ENCOUNTER — Telehealth: Payer: Self-pay | Admitting: Cardiology

## 2010-09-17 NOTE — Telephone Encounter (Signed)
Spoke with pt who states that she has been having her "heart run away with her" and a throbbing in her throat.  Does not have s/s now.  Pt can not come in to be seen today.  The next appt with Dr Antoine Poche is 4/24 at 2:30 pm .  Pt will come for appt then.  She is aware that if s/s return to call the office for evaluation sooner.

## 2010-09-23 ENCOUNTER — Encounter: Payer: Self-pay | Admitting: Cardiology

## 2010-09-23 ENCOUNTER — Ambulatory Visit (INDEPENDENT_AMBULATORY_CARE_PROVIDER_SITE_OTHER): Payer: Medicare Other | Admitting: Cardiology

## 2010-09-23 VITALS — BP 160/79 | HR 80 | Ht 65.0 in | Wt 194.0 lb

## 2010-09-23 DIAGNOSIS — R002 Palpitations: Secondary | ICD-10-CM

## 2010-09-23 DIAGNOSIS — E785 Hyperlipidemia, unspecified: Secondary | ICD-10-CM

## 2010-09-23 DIAGNOSIS — R0989 Other specified symptoms and signs involving the circulatory and respiratory systems: Secondary | ICD-10-CM

## 2010-09-23 DIAGNOSIS — I679 Cerebrovascular disease, unspecified: Secondary | ICD-10-CM | POA: Insufficient documentation

## 2010-09-23 DIAGNOSIS — I10 Essential (primary) hypertension: Secondary | ICD-10-CM

## 2010-09-23 MED ORDER — METOPROLOL TARTRATE 50 MG PO TABS: 50.0000 mg | ORAL_TABLET | Freq: Two times a day (BID) | ORAL | Status: DC

## 2010-09-23 NOTE — Progress Notes (Signed)
HPI The patient presents for followup of palpitations. These are extensively outlined in the previous note. Since that time the patient has worn an event monitor. There is much significant ventricular ectopy. However, I do not appreciate definitive evidence of atrial fibrillation. There are irregular runs that her short with no definitive P waves. She continues to feel palpitations particularly when she exerts herself. She is not describing any chest pressure, neck or arm discomfort. She said no presyncope or syncope. She has had no shortness of breath, PND or orthopnea. She's had no weight gain or edema.  Allergies  Allergen Reactions  . Atorvastatin   . Codeine   . Diazepam     REACTION: causes pt unable to wake up after taking  . Lipitor (Atorvastatin Calcium)   . Penicillins   . Sulfonamide Derivatives     Current Outpatient Prescriptions  Medication Sig Dispense Refill  . acetaminophen (TYLENOL) 500 MG tablet Take 500 mg by mouth every 6 (six) hours as needed.        [name] amitriptyline (ELAVIL) 10 MG tablet Take 10 mg by mouth at bedtime. One po daily       . Ascorbic Acid (VITAMIN C) 500 MG tablet Take 500 mg by mouth daily. Take one po bid with iron tablet       . aspirin 81 MG tablet Take 81 mg by mouth daily. One po once per day       . Cholecalciferol (VITAMIN D) 1000 UNITS capsule Take 1,000 Units by mouth daily. One po qd       . clopidogrel (PLAVIX) 75 MG tablet Take 75 mg by mouth daily. One po qd       . Cyanocobalamin (VITAMIN B 12) 100 MCG LOZG Take by mouth.        . ferrous sulfate 324 (65 FE) MG TBEC Take by mouth. One po bid       . furosemide (LASIX) 40 MG tablet Take 40 mg by mouth daily. One po qd       . glucose blood test strip 1 each by Other route as needed. Use as instructed       . hydrOXYzine (VISTARIL) 25 MG capsule Take 25 mg by mouth 4 (four) times daily as needed. One po every 6 hrs prn daily       . irbesartan (AVAPRO) 150 MG tablet Take 150 mg by mouth at  bedtime. Take 1 po qd       . meclizine (ANTIVERT) 25 MG tablet Take 25 mg by mouth 3 (three) times daily as needed. One po every 4 hrs prn for dizziness       . metFORMIN (GLUCOPHAGE) 500 MG tablet Take 500 mg by mouth 2 (two) times daily with meals. 1 po bid       . metoprolol (LOPRESSOR) 25 MG tablet Take 25 mg by mouth 2 (two) times daily. One po bid      . simvastatin (ZOCOR) 40 MG tablet Take 40 mg by mouth at bedtime. One po qd       . DISCONTD: gabapentin (NEURONTIN) 100 MG capsule Take 100 mg by mouth 3 (three) times daily. 1 po at bedtime and 2 tabs in the evening        Current Facility-Administered Medications  Medication Dose Route Frequency Provider Last Rate Last Dose  . methylPREDNISolone acetate (DEPO-MEDROL) injection 40 mg  40 mg Intra-articular Once Fuller Canada, MD      . methylPREDNISolone acetate (DEPO-MEDROL) injection  40 mg  40 mg Intra-articular Once Fuller Canada, MD        Past Medical History  Diagnosis Date  . Asthmatic bronchitis   . Sarcoidosis   . HTN (hypertension)   . PVD (peripheral vascular disease)   . Venous insufficiency   . Hyperlipemia   . DM (diabetes mellitus)   . Morbid obesity   . GERD (gastroesophageal reflux disease)   . Diverticulosis of colon   . IBS (irritable bowel syndrome)   . DJD (degenerative joint disease)   . Vitamin D deficiency disease   . Fibromyalgia   . Anxiety   . Intermittent vertigo   . Anemia   . Diverticulosis of colon   . Knee pain   . Hip pain   . Anserine bursitis   . Bursitis of hip   . Palpitations   . Patellar subluxation     malaignment     Past Surgical History  Procedure Date  . Cholecystectomy   . Tonsillectomy   . Hernia repair   . Cataract extraction     ROS:  As stated in the HPI and negative for all other systems.  PHYSICAL EXAM BP 160/79  Pulse 80  Ht 5\' 5"  (1.651 m)  Wt 194 lb (87.998 kg)  BMI 32.28 kg/m2  General: Well developed, well nourished, in no acute distress.    Head: normocephalic and atraumatic  Eyes: PERRLA/EOM intact; conjunctiva and lids normal.  Neck: Neck supple, no JVD. No masses, thyromegaly or abnormal cervical nodes.  Chest Wall: no deformities or breast masses noted  Lungs: Clear bilaterally to auscultation and percussion.  Abdomen: Bowel sounds positive; abdomen soft and non-tender without masses, organomegaly, or hernias noted. No hepatosplenomegaly.  Msk: Back normal, normal gait. Muscle strength and tone normal.  Extremities: No clubbing or cyanosis.  Neurologic: Alert and oriented x 3.  Skin: Intact without lesions or rashes.  Cervical Nodes: no significant adenopathy  Axillary Nodes: no significant adenopathy  Psych: Normal affect.  Detailed Cardiovascular Exam  Carotids: Carotids full and equal bilaterally with slight right bruit.  Neck Veins: Normal, no JVD.  Heart:  Inspection: no deformities or lifts noted.  Palpation: normal PMI with no thrills palpable.  Auscultation: regular rate and rhythm, S1, S2 with slight apical systolic murmurs, rubs, gallops, or clicks.  Vascular   Abdominal, Aorta: no palpable masses, pulsations, or audible bruits.  Femoral Pulses: normal femoral pulses bilaterally.  Pedal Pulses: normal pedal pulses bilaterally.  Radial Pulses: normal radial pulses bilaterally.  Peripheral Circulation: no clubbing, cyanosis, or edema noted with normal capillary refill.    ASSESSMENT AND PLAN

## 2010-09-23 NOTE — Patient Instructions (Addendum)
Increase Metoprolol to 50 mg twice a day Have a carotid doppler Follow up with Dr Antoine Poche in 2 months in [facility] June 27 at 2 pm

## 2010-09-23 NOTE — Assessment & Plan Note (Signed)
I will defer to Dr. ADL.

## 2010-09-23 NOTE — Assessment & Plan Note (Signed)
Her blood pressure is slightly elevated. This will be managed in the context of treating her palpitations.

## 2010-09-23 NOTE — Assessment & Plan Note (Signed)
The patient needs to have palpitations. I would like to be as conservative as possible. She's got a very difficult time getting around and does not want to take Coumadin. She's afraid of falls. I don't have any definitive evidence that she has fibrillation. Therefore, I will not start anticoagulation. I have reviewed thoroughly a 21 day event monitor. I will however increase her metoprolol to 50 mg b.i.d. To see if this helps her symptomatically.

## 2010-09-23 NOTE — Assessment & Plan Note (Signed)
She understands the need for weight loss with dietary restriction.

## 2010-09-23 NOTE — Assessment & Plan Note (Signed)
I hear a slight carotid bruit and I will order carotid Dopplers.

## 2010-10-14 ENCOUNTER — Other Ambulatory Visit: Payer: Self-pay | Admitting: *Deleted

## 2010-10-14 ENCOUNTER — Encounter (INDEPENDENT_AMBULATORY_CARE_PROVIDER_SITE_OTHER): Payer: Medicare Other | Admitting: *Deleted

## 2010-10-14 DIAGNOSIS — R0989 Other specified symptoms and signs involving the circulatory and respiratory systems: Secondary | ICD-10-CM

## 2010-10-14 DIAGNOSIS — I6529 Occlusion and stenosis of unspecified carotid artery: Secondary | ICD-10-CM

## 2010-10-15 ENCOUNTER — Encounter: Payer: Self-pay | Admitting: Cardiology

## 2010-10-16 ENCOUNTER — Encounter: Payer: Medicare Other | Admitting: Cardiology

## 2010-10-17 NOTE — H&P (Signed)
NAME:  Alexandra [name], Alexandra [name]                          ACCOUNT NO.:  [id number]   MEDICAL RECORD NO.:  [id number]                   PATIENT TYPE:  INP   LOCATION:  A214                                 FACILITY:  [facility]   PHYSICIAN:  Vania Rea, M.D.              DATE OF BIRTH:  15-Jan-1924   DATE OF ADMISSION:  01/04/2004  DATE OF DISCHARGE:                                HISTORY & PHYSICAL   PRIMARY CARE PHYSICIAN:  Dr. Lonzo Cloud. Christoper Allegra.   NEUROLOGIST:  Dr. Gustavus Messing. Orlin Hilding, M.D.   CHIEF COMPLAINT:  Progressive dizziness since Wednesday.   HISTORY OF PRESENT ILLNESS:  This is a 77 year old Caucasian lady with a  long history of episodic vertigo apparently thoroughly investigated in  [place] with neurology consultation, ENT consultation.  No specific  causes found.  The patient gives a history that it may be related to  something that sounds like vertebrobasilar insufficiency possibly related to  arthritis.  In any case, the patient has been in fairly good health until  three days ago when the vertigo in the form of swaying to and fro motion of  her surroundings restarted.  She was well enough to drive to her  hairdresser, but when she got up from bending over a basin at the  hairdresser, she suddenly became dizzy and was unable to stand properly, and  the hairdresser became alarmed and brought her to the emergency room.  In  the emergency room, she was evaluated, and no acute issues were found, but  when they attempted to walk her to see if she could go home, she became  dizzy and vertiginous and nauseous without vomiting.  The decision was made  to admit the patient since she lives alone and would be unable care for  herself.   PAST MEDICAL HISTORY:  There is no history of cough or cold, no shortness of  breath or chest pain.  No frank syncope or loss of consciousness.  No falls.  Apparent cerebrovascular disease for which she takes Plavix, hypertension,  hypercholesterolemia, anxiety, lower-extremity edema.  Denies any history of  heart problems.   MEDICATIONS:  1. Plavix 75 mg daily.  2. Toprol 50 mg twice daily.  3. Aspirin 325 mg daily.  4. Diovan 160 mg at bedtime.  5. Lipitor 20 mg at bedtime.  6. Elavil 10 mg at bedtime.  7. Lasix 40 mg daily.   ALLERGIES:  CODEINE AND PENICILLIN.  She cannot remember reactions.  It was  some time ago.   SOCIAL HISTORY:  She lives alone.  She was widowed almost a year ago.  She  has one son who lives in [place].  She denies tobacco, alcohol or illicit  drug use.  She is a retired Visual merchandiser.   FAMILY HISTORY:  Significant for one parent who died at the age of 36 with  leukemia, and another who died at the  age of 47 of congestive heart failure.  She is an only child.  She has one son who is in good health.   REVIEW OF SYSTEMS:  She denies any symptoms in all systems, although she  does have a history of migraines, but has not had an attack recently.  She  did have a history of arthritis in the cervical spine.  She does have a  history of episodic swelling of the legs.   MEDICATIONS:  Atenolol.   PHYSICAL EXAMINATION:  GENERAL:  This is a very pleasant and jovial,  elderly, youthful, 77 year old, well-nourished, Caucasian lady sitting up in  bed, wishing to extend the conversations at length, willing to talk about  her dizziness at great length.  She does not mind staying in the hospital  over the weekend.  She denies any pain.  VITAL SIGNS:  Temperature 97.3, blood pressure 138/55,  pulse 69,  respirations 14.  She is saturating at 98%.  No orthostatics have been done,  but when the patient sits forward, she says she feels dizzy without vertigo.  HEENT:  Extraocular muscle movements are intact, and there is no vertigo  with shaking of her head, but as we said, with attempting to bend forward,  the vertigo comes then.  Pupils are round, equal, and reactive to light.  Mucous membranes  are moist.  She is anicteric.  She has no thyromegaly.  CHEST:  Clear to auscultation bilaterally.  CARDIOVASCULAR SYSTEM:  She has a regular rhythm.  ABDOMEN:  Obese, soft and nontender.  EXTREMITIES:  Without edema.  NEUROLOGIC:  Central nervous system:  She has no focal neurologic deficit.   LABORATORY DATA:  CBC is normal with the exception of a mild anemia.  Hemoglobin 11.6, hematocrit 33.3.  Normal differential.  Serum chemistry is  close to normal with sodium of 136, potassium 3.9, chloride 101.  CO2 30.  Glucose 119, BUN 18, creatinine 0.9. calcium 9.3.   CT of the head is negative for any acute event.   ASSESSMENT:  Acute on chronic dizziness, sounds like vascular, could be  related to the heat at the time, and the fact that the patient is taking  Lasix, maybe some orthostasis involved, may be idiopathic considering the  extensive history.   PLAN:  We will continue the patient on her usual medications with the  exception of Lasix since her BUN and creatinine ratio is increased.  We will  get her orthostatics.  We will also hold her Lipitor since she feels  convinced that it is associated with Lipitor which she only started taking  about three weeks ago.  Elavil, she has been using that for many, many  years, and she is convinced that is not associated with her dizziness.  We  will need to get in touch with her primary care physician for further  assistance for this lady.  She will probably need physical therapy to assist  with mobilization.     ___________________________________________                                         Vania Rea, M.D.   LC/MEDQ  D:  01/04/2004  T:  01/04/2004  Job:  [id number]

## 2010-10-17 NOTE — Discharge Summary (Signed)
NAME:  Alexandra [name], Alexandra [name]                          ACCOUNT NO.:  [id number]   MEDICAL RECORD NO.:  [id number]                   PATIENT TYPE:  INP   LOCATION:  A327                                 FACILITY:  [facility]   PHYSICIAN:  Vania Rea, M.D.              DATE OF BIRTH:  11-16-1923   DATE OF ADMISSION:  01/04/2004  DATE OF DISCHARGE:  01/07/2004                                 DISCHARGE SUMMARY   PRIMARY CARE PHYSICIAN:  Dr. __________ Lorin Picket   NEUROLOGIST:  Dr. Gustavus Messing. Weymann   DISCHARGE DIAGNOSES:  1. Dizziness of unclear etiology resolved.  2. Hypertension, controlled.  3. History of hypercholesterolemia.  4. History of cerebrovascular disease on Plavix.  5. History of chronic anxiety.   DISPOSITION:  Discharged home.   DISCHARGE CONDITION:  Stable.   DISCHARGE MEDICATIONS:  1. Meclizine 25 mg 3 times daily.  2. Plavix 75 mg daily.  3. Toprol 50 mg twice daily as per primary care physician.  4. Aspirin 325 mg daily.  5. Diovan 160 mg at bedtime.  6. Lipitor 20 mg at bedtime.  7. Elavil 10 mg at bedtime.   HOSPITAL COURSE:  Please refer to history and physical of August 5.  This is  a 77 year old Caucasian lady with a long history of episodic  vertigo/dizziness who presented to our emergency room on a Friday,  complaining of episodic dizziness since a Wednesday.   Physical exam revealed no clear cause of her dizziness.  However, she was  noted to be dizzy when attempting to sit forward from reclining back.  There  was no nystagmus.  There as no vomiting.  The patient had orthostatics  checked daily while in the hospital and orthostatics were always negative.  However, because of her unsteadiness, the patient could not be discharged.  Today the patient says that she is feeling fine.  She has been ambulating  the halls without dizziness.  She continues on her meclizine  and she is  being discharged home to follow up with her primary care physician.   While the  patient was in hospital she was actually receiving Lopressor 50 mg  twice daily instead of Toprol 50 mg twice daily.  This can be adjusted by  her primary care physician as he or she decides.   While in hospital the patient was receiving Avapro 300 mg each morning  rather than Diovan 160 mg at bedtime as recommended by her primary care  physician.  This can be sorted out by her primary care physician as he  decides.   While in hospital the patient was not receiving Lasix because of the heat,  at that time, it was considered that she may be dehydrated.  The primary  care physician can choose to restart Lasix if he desires.   FOLLOW UP:  1. Follow up with primary care physician, Dr. __________ Lorin Picket.  2. Follow up with  neurologist, Dr. Gustavus Messing. Weymann.     ___________________________________________                                         Vania Rea, M.D.   LC/MEDQ  D:  01/07/2004  T:  01/07/2004  Job:  [id number]   cc:   Santina Evans A. Orlin Hilding, M.D.  [address]  [address]  [place]  [place] [address]  Fax: [phone]   Dr. Lorin Picket

## 2010-10-22 ENCOUNTER — Telehealth: Payer: Self-pay | Admitting: *Deleted

## 2010-10-22 NOTE — Telephone Encounter (Signed)
Pt would like to see cardiologist in [place] as it is closer to her home.  Will forward pts request to [facility] office.  OK with Dr Antoine Poche for pt to follow up there

## 2010-10-22 NOTE — Telephone Encounter (Signed)
Pt's 6/27 ov in [place] rescheduled to July 9th with Dr. Antoine Poche in [place] per pt's request.

## 2010-10-28 ENCOUNTER — Other Ambulatory Visit: Payer: Self-pay | Admitting: Pulmonary Disease

## 2010-10-28 ENCOUNTER — Ambulatory Visit: Payer: Medicare Other | Admitting: Cardiology

## 2010-10-29 ENCOUNTER — Other Ambulatory Visit: Payer: Self-pay | Admitting: Pulmonary Disease

## 2010-11-10 ENCOUNTER — Encounter: Payer: Self-pay | Admitting: Cardiology

## 2010-11-18 ENCOUNTER — Encounter: Payer: Self-pay | Admitting: Pulmonary Disease

## 2010-11-18 ENCOUNTER — Ambulatory Visit (INDEPENDENT_AMBULATORY_CARE_PROVIDER_SITE_OTHER): Payer: Medicare Other | Admitting: Pulmonary Disease

## 2010-11-18 DIAGNOSIS — I872 Venous insufficiency (chronic) (peripheral): Secondary | ICD-10-CM

## 2010-11-18 DIAGNOSIS — R002 Palpitations: Secondary | ICD-10-CM

## 2010-11-18 DIAGNOSIS — I10 Essential (primary) hypertension: Secondary | ICD-10-CM

## 2010-11-18 DIAGNOSIS — I739 Peripheral vascular disease, unspecified: Secondary | ICD-10-CM

## 2010-11-18 DIAGNOSIS — E119 Type 2 diabetes mellitus without complications: Secondary | ICD-10-CM

## 2010-11-18 DIAGNOSIS — D869 Sarcoidosis, unspecified: Secondary | ICD-10-CM

## 2010-11-18 DIAGNOSIS — D649 Anemia, unspecified: Secondary | ICD-10-CM

## 2010-11-18 DIAGNOSIS — E785 Hyperlipidemia, unspecified: Secondary | ICD-10-CM

## 2010-11-18 DIAGNOSIS — K5730 Diverticulosis of large intestine without perforation or abscess without bleeding: Secondary | ICD-10-CM

## 2010-11-18 DIAGNOSIS — K219 Gastro-esophageal reflux disease without esophagitis: Secondary | ICD-10-CM

## 2010-11-18 DIAGNOSIS — K589 Irritable bowel syndrome without diarrhea: Secondary | ICD-10-CM

## 2010-11-18 DIAGNOSIS — M1990 Unspecified osteoarthritis, unspecified site: Secondary | ICD-10-CM

## 2010-11-18 DIAGNOSIS — F411 Generalized anxiety disorder: Secondary | ICD-10-CM

## 2010-11-18 NOTE — Progress Notes (Signed)
Subjective:    Patient ID: Alexandra [name], female    DOB: [birth year], 77 y.o.   MRN: [id number]  HPI 77 y/o WF here for a 4 month follow up visit... he has multiple medical problems including AB, hx inactive Sarcoid, HBP, PVD, VI, Hyperlipidemia, DM, Obesity, GERD/ Divertics/ IBS, DJD/ FM/ osteop, Anxiety, Anemia...  ~  March 19, 2010:  4 mo ROV & she notes stable- Hydroxyzine helps "throat" episodes that occur w/ anxiety; no intercurrent infections;  BP stable on meds;  still too sedentary & not effectively dieting (wt w/o change);  she had BMD 6/11= osteoporotic in left FemNeck & Alendronate recommended to her but she flat out refuses to take the Bisphos Rx "I'll take my chances"...  ~  July 22, 2010:  81mo ROV & weight is down 13# on diet;  she's had occas episodes of palpit, heart racing & notes "throat closing" sensation when this occurs, noted w/ activity & valsalva maneuver eg bending down & assoc w/ SOB but denies CP;  EKG w/ atrial arrhythmias/ PACs & we discussed need for further Cards eval- she requests the [facility] office;  she is currently on ASA, Plavix, Metoprolol 25mg Bid, Avapro 150mg /d, Lasix 40mg /d;  BP well controlled on this...    She has hx old burnt-out Sarcoid> yearly CXR today shows mild cardiomeg, tort Ao, chr incr markings, NAD;  denies cough, sput, chr in her baseline SOB/ DOE, etc...    Her FLP looks good on Simva40;  DM control is excellent on Metformin Bid;  weight down to 190# as noted;  GI appears stable & is followed in [place] now;  Ortho per DrSHarrison in [place] w/ shots every 33mo or so!;  hx Anemia & Hg improved to 11.6 today, Fe=51, Sat=16%, B12 is high;  she uses Hydroxyzine (Vistaril) 25mg  Prn for nerves...  ~  November 18, 2010:  81mo ROV & she seems more anxious & sl more agitated than prev, also weaker, more feeble (ie couldn't step up onto the exam table); she is concerned for her cardiac status> DrHochrein did Holter monitor w/ PAcs/ PVCs & no dangerous  arrhythmias; CDopplers ordered for ?CBruit but I cannot find the results in EPIC;  He increased her Metoprolol to 50mg Bid, and she increased her ASA to 81mg Bid as well... She notes that her HYDROXYZINE (Vistaril) helps more than anything & I mentioned it was OK to take this regularly;  See prob list below>>   Problem List:  Hx of ASTHMATIC BRONCHITIS, ACUTE (ICD-466.0) - uses Mucinex as needed... she has chr stable DOE w/o change... she say she walks "some" but is obviously too sedentary due to her arthritis... she has PNEUMOVAX several yrs ago.  Hx of SARCOIDOSIS (ICD-135) - initially diagnosed in 1992 w/ CXR showing diffuse ILDis, +Gallium scan, ACE=61, & Bronch w/ bx showing non-caseating granulomas... treated w/ Pred & weaned off w/ no active disease since then... last CTChest 7/04 w/ ca++ Ao & coronaries, +mediastinal fat, centilobular emphysema, some scarring... stable without new symptoms of cough, sputum, CP or dyspnea... ~  CXR 1/09 showed stable chr lung changes & prom right hilum... ~  CXR 2/11 showed diffuse ILDz, no adenopathy, NAD.[name]. ~  CXR 2/12 showed borderline cardiomeg, tort Ao, sl incr markings as before, NAD...  HYPERTENSION (ICD-401.9) - controlled on meds: METOPROLOL 50mg Bid, AVAPRO 150mg /d, LASIX 40mg /d... BP= 136/74 today, tol Rx well...  denies HA, visual changes, CP, palipit, syncope, change in dyspnea, edema, etc... ~  Myoview 12/05 was neg-  no ischemia, no infarct... ~  EKG 2/11 showed NSR, rate 82/min, WNL.[name]. ~  4/12:  DrHochrein increased her Metoprolol from 25Bid to 50Bid for her BP & ectopy...  PALPITATIONS & Ventric Ectopy>  Eval & Rx by DrHochrein, his notes are reviewed... On METOPROLOL 50mg Bid & she uses HYDROXYZINE (Vistaril) 25mg  Prn which she says is helpful...  PERIPHERAL VASCULAR DISEASE (ICD-443.9) - on ASA 81mg /d, PLAVIX 75mg /d... doing well w/o claud symptoms... ~  Art Dopplers of LE's 6/06 showed biphasic waveforms in ant tib & peroneal arts, normal  ABI's...  VENOUS INSUFFICIENCY (ICD-459.81) - on low sodium diet and the Lasix... she knows to elevate legs, wear support hose, etc...  HYPERLIPIDEMIA (ICD-272.4) - on SIMVASTATIN 40mg /d... ~  FLP 1/08 showed TChol 121, TG 120, HDL 46, LDL 51... looks great- continue Simva40. ~  FLP 1/09 showed TChol 137, TG 165, HDL 37, LDL 67 ~  FLP 9/09 showed TChol 133, TG 141, HDL 47, LDL 57 ~  FLP 5/10 showed TChol 131, TG 190, HDL 46, LDL 47 ~  FLP 2/11 showed TChol 129, TG 129, HDL 52, LDL 51 ~  FLP 2/12 showed TChol 134, TG 159, HDL 49, LDL 53  DM (ICD-250.00) - on diet + METFORMIN 500mg - 1Bid... unable to exercise or lose wt...  ~  labs 1/08 showed BS=130 and HgA1c=6.1 ~  labs 1/09 showed BS= 137, HgA1c= 6.2... diet and exercise are the keys!!! ~  labs 9/09 showed BS= 119, HgA1c= 6.3 ~  labs 5/10 showed BS= 134, A1c= 6.3 ~  labs 2/11 showed BS= 122, A1c= 6.3 ~  labs 6/11 showed BS= 106 ~  Labs 2/12 showed BS= 138, A1c= 6.3  MORBID OBESITY (ICD-278.01) - prev wt 227#  & 5\' 2"  tall for a BMI=41-42...  ~  weight 1/10 = 217# ~  weight 5/10 = 215# ~  weight 9/10 = 210# ~  weight 2/11 = 210# ~  weight 6/11 = 203# ~  weight 10/11 = 203# ~  Weight 6/12 = 195#  GERD (ICD-530.81) - pt uses OTC Prilosec Prn... denies CP, dysphagia, reflux, N/ V, etc...  DIVERTICULOSIS, COLON (ICD-562.10) - had colonoscopy by DrDBrodie 1997 that was neg x divertics... now followed in [place]...  IRRITABLE BOWEL SYNDROME (ICD-564.1)  DEGENERATIVE JOINT DISEASE, ADVANCED (ICD-715.90) - she has mod severe bilat knee pain and sees DrHarrison, Ortho for shots "every three months"... she is opposed to surgery (see his EMR notes- reviewed)...  VITAMIN D DEFICIENCY (ICD-268.9) ~  labs 5/10 showed Vit D level =13... therefore rec Vit D 50000 u weekly. ~  labs 2/11 showed Vit D level = 32... OK to change to 1000 u daily.. ~  BMD done 6/11 showed TScores -1.3 in Spine, and -2.5 in left FemNeck... rec> start  Alendronate 70mg /wk but she refuses Bisphos Rx "I'll take mu=y chances"...  FIBROMYALGIA (ICD-729.1)  ANXIETY (ICD-300.00) - we perscribed HYDROXYZINE PAMOATE 75mg /d Prn for "throat closing" symptoms...  ANEMIA (ICD-285.9) - hx mild anemia w/ Hg = 10.8 Jan09 & started Fe daily at that time...  ~  labs 5/09 showed Hg= 11.7, Fe= 64 ~  labs 9/09 showed Hg= 11.4, Fe= 43... rec- take FeSO4 w/ Vit C 500mg /d. ~  labs 5/10 showed Hg= 11.3,  Fe= 50 ~  labs 2/11 showed Hg= 10.7, Fe= 36 (12%)... rec> incr Fe Vit C to Bid. ~  labs 6/11 showed Hg= 10.9, Fe= 58 (19%sat)... continue Bid Fe ~  Labs 2/12 showed Hg= 11.6, Fe= 51 (16%sat)... Continue  same.  INTERMITTENT VERTIGO (ICD-780.4) - prev eval DrWeymann w/ Meclizine Prn...   Past Surgical History  Procedure Date  . Cholecystectomy   . Tonsillectomy   . Hernia repair   . Cataract extraction     Outpatient Encounter Prescriptions as of 11/18/2010  Medication Sig Dispense Refill  . acetaminophen (TYLENOL) 500 MG tablet Take 500 mg by mouth every 6 (six) hours as needed.        [name] amitriptyline (ELAVIL) 10 MG tablet Take 10 mg by mouth at bedtime. One po daily       . Ascorbic Acid (VITAMIN C) 500 MG tablet Take 500 mg by mouth daily. Take one po bid with iron tablet       . aspirin 81 MG tablet Two tablets  po once per day      . Cholecalciferol (VITAMIN D) 1000 UNITS capsule Take 1,000 Units by mouth daily. One po qd       . clopidogrel (PLAVIX) 75 MG tablet Take 75 mg by mouth daily. One po qd       . Cyanocobalamin (VITAMIN B 12) 100 MCG LOZG Take by mouth.        . ferrous sulfate 324 (65 FE) MG TBEC Take by mouth. One po bid       . furosemide (LASIX) 40 MG tablet Take 40 mg by mouth daily. One po qd       . glucose blood test strip 1 each by Other route as needed. Use as instructed       . hydrOXYzine (VISTARIL) 25 MG capsule Take 25 mg by mouth 4 (four) times daily as needed. One po every 6 hrs prn daily       . irbesartan (AVAPRO) 150  MG tablet Take 150 mg by mouth at bedtime. Take 1 po qd       . meclizine (ANTIVERT) 25 MG tablet TAKE 1 TABLET BY MOUTH EVERY 4 HOURS AS NEEDED FOR  DIZZINESS.  50 tablet  5  . metFORMIN (GLUCOPHAGE) 500 MG tablet Take 500 mg by mouth 2 (two) times daily with meals. 1 po bid       . metoprolol (LOPRESSOR) 50 MG tablet Take 1 tablet (50 mg total) by mouth 2 (two) times daily.  60 tablet  11  . simvastatin (ZOCOR) 40 MG tablet Take 40 mg by mouth at bedtime. One po qd       . DISCONTD: meclizine (ANTIVERT) 25 MG tablet TAKE 1 TABLET BY MOUTH EVERY 4 HOURS AS NEEDED FOR  DIZZINESS.  50 tablet  5   Facility-Administered Encounter Medications as of 11/18/2010  Medication Dose Route Frequency Provider Last Rate Last Dose  . methylPREDNISolone acetate (DEPO-MEDROL) injection 40 mg  40 mg Intra-articular Once Fuller Canada, MD      . methylPREDNISolone acetate (DEPO-MEDROL) injection 40 mg  40 mg Intra-articular Once Fuller Canada, MD        Allergies  Allergen Reactions  . Atorvastatin   . Codeine   . Diazepam     REACTION: causes pt unable to wake up after taking  . Lipitor (Atorvastatin Calcium)   . Penicillins   . Sulfonamide Derivatives     Review of Systems        See HPI - all other systems neg except as noted... The patient complains of decreased hearing, dyspnea on exertion, peripheral edema, muscle weakness, and difficulty walking.  The patient denies anorexia, fever, weight loss, weight gain, vision loss, hoarseness, chest pain,  syncope, prolonged cough, headaches, hemoptysis, abdominal pain, melena, hematochezia, severe indigestion/heartburn, hematuria, incontinence, suspicious skin lesions, transient blindness, depression, unusual weight change, abnormal bleeding, enlarged lymph nodes, and angioedema.   Objective:   Physical Exam      WD, Obese, 77 y/o WF in NAD... GENERAL:  Alert & oriented; pleasant & cooperative... she is very talkative... HEENT:  [place]/AT, Glasses,  EOM-full, PERRLA, TMs-wax, NOSE-clear, THROAT-clear & wnl. NECK:  Supple w/ fairROM; no JVD; no carotid bruits; no thyromegaly or nodules palpated; no lymphadenopathy. CHEST:  decr BS bilat but clear w/o wheezing, rales or signs of consolidation... HEART:   sl irregular rhythm; without murmurs/ rubs/ or gallops detected... ABDOMEN:  Obese w/ panniculus, soft & nontender; normal bowel sounds; no organomegaly or masses palpated. EXT:  mod arthritic changes; no varicose veins/ +venous insuffic/ tr+edema bilat NEURO:  CN's intact, no focal neuro deficits... DERM:  few ecchymoses, no rash etc...   Assessment & Plan:   AB>  Breathing is stable w/o exac...  Hx Sarcoid>  CXR w/ chr changes, NAD...  HBP>  Controlled on meds, continue same, take regularly...  Palpit>  On going eval & med adjustments from DrHochrein; better on the ince Metoprolol but notes that the Vistaril really helps...  PVD>  On ASA & Plavix w/o cerebral ischemic symptoms...  HYPERLIPID>  Stable on the Simva40...  DM>  Stable on the Metformin & wt is coming down some...  GI>  GERD, Divertics, IBS>  Stable, continue same Rx...  DJD>  Followed by DrHarrison in [place]...  Anxiety>  As noted the Hydroxyzine really helps.Marland KitchenMarland Kitchen

## 2010-11-18 NOTE — Patient Instructions (Signed)
Today we updated your med list in EPIC>    Continue your current meds the same...  Try to be as active as possible, but be safe> no falling allowed!!!  Call for any questions...  Let's plan a follow up visit in 4 months & we will plan fasting blood work at that time.Marland KitchenMarland Kitchen

## 2010-11-26 ENCOUNTER — Ambulatory Visit: Payer: Medicare Other | Admitting: Cardiology

## 2010-12-03 ENCOUNTER — Encounter: Payer: Self-pay | Admitting: Pulmonary Disease

## 2010-12-08 ENCOUNTER — Ambulatory Visit: Payer: Medicare Other | Admitting: Cardiology

## 2010-12-09 ENCOUNTER — Other Ambulatory Visit: Payer: Self-pay | Admitting: Pulmonary Disease

## 2010-12-15 ENCOUNTER — Encounter: Payer: Self-pay | Admitting: Cardiology

## 2010-12-15 ENCOUNTER — Ambulatory Visit (INDEPENDENT_AMBULATORY_CARE_PROVIDER_SITE_OTHER): Payer: Medicare Other | Admitting: Cardiology

## 2010-12-15 DIAGNOSIS — I10 Essential (primary) hypertension: Secondary | ICD-10-CM

## 2010-12-15 DIAGNOSIS — R002 Palpitations: Secondary | ICD-10-CM

## 2010-12-15 DIAGNOSIS — R0989 Other specified symptoms and signs involving the circulatory and respiratory systems: Secondary | ICD-10-CM

## 2010-12-15 NOTE — Patient Instructions (Signed)
Your physician you to follow up in 1 year. You will receive a reminder letter in the mail one-two months in advance. If you don't receive a letter, please call our office to schedule the follow-up appointment. Your physician recommends that you continue on your current medications as directed. Please refer to the Current Medication list given to you today. 

## 2010-12-15 NOTE — Assessment & Plan Note (Signed)
She had some mild carotid stenosis and will have a repeat examination next spring.

## 2010-12-15 NOTE — Progress Notes (Signed)
HPI Alexandra [name] presents for follow up of her palpitations.  At the last visit I increased her beta blocker. She did well with this. She still gets some palpitations but these are not particularly bothersome. She said no presyncope or syncope. She's been mostly bothered by vertigo. She's not had any new chest pressure, neck or arm discomfort. She's had no new shortness of breath, PND or orthopnea.  Allergies  Allergen Reactions  . Codeine   . Diazepam     REACTION: causes pt unable to wake up after taking  . Lipitor (Atorvastatin Calcium)   . Penicillins   . Sulfonamide Derivatives     Current Outpatient Prescriptions  Medication Sig Dispense Refill  . acetaminophen (TYLENOL) 500 MG tablet Take 500 mg by mouth every 6 (six) hours as needed.        [name] amitriptyline (ELAVIL) 10 MG tablet Take 10 mg by mouth at bedtime. One po daily       . Ascorbic Acid (VITAMIN C) 500 MG tablet Take 500 mg by mouth daily. Take one po bid with iron tablet       . aspirin 81 MG tablet Two tablets  po once per day      . Cholecalciferol (VITAMIN D) 1000 UNITS capsule Take 1,000 Units by mouth daily. One po qd       . clopidogrel (PLAVIX) 75 MG tablet Take 75 mg by mouth daily. One po qd       . Cyanocobalamin (VITAMIN B 12) 100 MCG LOZG Take by mouth.        . ferrous sulfate 324 (65 FE) MG TBEC Take by mouth. One po bid       . furosemide (LASIX) 40 MG tablet Take 40 mg by mouth daily. One po qd       . glucose blood test strip 1 each by Other route as needed. Use as instructed       . hydrOXYzine (VISTARIL) 25 MG capsule Take 25 mg by mouth 4 (four) times daily as needed. One po every 6 hrs prn daily       . irbesartan (AVAPRO) 150 MG tablet Take 150 mg by mouth at bedtime. Take 1 po qd       . meclizine (ANTIVERT) 25 MG tablet TAKE 1 TABLET BY MOUTH EVERY 4 HOURS AS NEEDED FOR  DIZZINESS.  50 tablet  5  . metFORMIN (GLUCOPHAGE) 500 MG tablet TAKE 1 TABLET TWICE DAILY  60 tablet  3  . metoprolol (LOPRESSOR)  50 MG tablet Take 1 tablet (50 mg total) by mouth 2 (two) times daily.  60 tablet  11  . simvastatin (ZOCOR) 40 MG tablet Take 40 mg by mouth at bedtime. One po qd        Current Facility-Administered Medications  Medication Dose Route Frequency Provider Last Rate Last Dose  . methylPREDNISolone acetate (DEPO-MEDROL) injection 40 mg  40 mg Intra-articular Once Fuller Canada, MD      . methylPREDNISolone acetate (DEPO-MEDROL) injection 40 mg  40 mg Intra-articular Once Fuller Canada, MD        Past Medical History  Diagnosis Date  . Asthmatic bronchitis   . Sarcoidosis   . HTN (hypertension)   . PVD (peripheral vascular disease)   . Venous insufficiency   . Hyperlipemia   . DM (diabetes mellitus)   . Morbid obesity   . GERD (gastroesophageal reflux disease)   . Diverticulosis of colon   . IBS (irritable bowel syndrome)   .  DJD (degenerative joint disease)   . Vitamin D deficiency disease   . Fibromyalgia   . Anxiety   . Intermittent vertigo   . Anemia   . Diverticulosis of colon   . Knee pain   . Hip pain   . Anserine bursitis   . Bursitis of hip   . Palpitations   . Patellar subluxation     malaignment     Past Surgical History  Procedure Date  . Cholecystectomy   . Tonsillectomy   . Hernia repair   . Cataract extraction     ROS:  As stated in the HPI and negative for all other systems.  PHYSICAL EXAM BP 122/73  Pulse 68  Ht 5\' 5"  (1.651 m)  Wt 190 lb (86.183 kg)  BMI 31.62 kg/m2 PHYSICAL EXAM GEN:  No distress NECK:  No jugular venous distention at 90 degrees, waveform within normal limits, carotid upstroke brisk and symmetric, no bruits, no thyromegaly LYMPHATICS:  No cervical adenopathy LUNGS:  Clear to auscultation bilaterally BACK:  No CVA tenderness CHEST:  Unremarkable HEART:  S1 and S2 within normal limits, no S3, no S4, no clicks, no rubs, no murmurs ABD:  Positive bowel sounds normal in frequency in pitch, no bruits, no rebound, no  guarding, unable to assess midline mass or bruit with the patient seated. EXT:  2 plus pulses throughout, moderate edema, no cyanosis no clubbing SKIN:  No rashes no nodules NEURO:  Cranial nerves II through XII grossly intact, motor grossly intact throughout PSYCH:  Cognitively intact, oriented to person place and time   ASSESSMENT AND PLAN

## 2010-12-15 NOTE — Assessment & Plan Note (Signed)
Her blood pressure is controlled and she will continue meds as listed. 

## 2010-12-15 NOTE — Assessment & Plan Note (Signed)
At present these are not particularly severe and I will continue with the current medical management. She wants conservative evaluation and treatment and I agree with this.

## 2010-12-16 ENCOUNTER — Ambulatory Visit (INDEPENDENT_AMBULATORY_CARE_PROVIDER_SITE_OTHER): Payer: Medicare Other | Admitting: Orthopedic Surgery

## 2010-12-16 DIAGNOSIS — M25569 Pain in unspecified knee: Secondary | ICD-10-CM

## 2010-12-16 DIAGNOSIS — J209 Acute bronchitis, unspecified: Secondary | ICD-10-CM

## 2010-12-16 MED ORDER — METHYLPREDNISOLONE ACETATE 40 MG/ML IJ SUSP: 40.0000 mg | Freq: Once | INTRAMUSCULAR | Status: DC

## 2010-12-16 NOTE — Progress Notes (Signed)
Followup for bilateral knee injections  Injection LEFT knee.  Consent was obtained.  Time out was taken   LEFT knee was injected with Depo-Medrol 40 mg plus lidocaine 1% 4 cc.  Knee was prepped with alcohol and anesthetized with ethyl chloride.  The injection was tolerated without complication.   Verbal consent was obtained   Time out completed   Under sterile conditions the right knee was injected with Depomedrol 40 mg / ml (1 ml) and lidocaine 1% (4 ml)  There were no complications   Treatment followup

## 2010-12-16 NOTE — Patient Instructions (Signed)
You have received a steroid shot. 15% of patients experience increased pain at the injection site with in the next 24 hours. This is best treated with ice and tylenol extra strength 2 tabs every 8 hours. If you are still having pain please call the office.    

## 2011-01-06 ENCOUNTER — Other Ambulatory Visit: Payer: Self-pay | Admitting: Pulmonary Disease

## 2011-01-09 ENCOUNTER — Other Ambulatory Visit: Payer: Self-pay | Admitting: Pulmonary Disease

## 2011-02-09 ENCOUNTER — Other Ambulatory Visit: Payer: Self-pay | Admitting: Pulmonary Disease

## 2011-02-26 ENCOUNTER — Other Ambulatory Visit: Payer: Self-pay | Admitting: Pulmonary Disease

## 2011-03-02 DIAGNOSIS — I2510 Atherosclerotic heart disease of native coronary artery without angina pectoris: Secondary | ICD-10-CM

## 2011-03-02 HISTORY — DX: Atherosclerotic heart disease of native coronary artery without angina pectoris: I25.10

## 2011-03-03 ENCOUNTER — Other Ambulatory Visit: Payer: Self-pay | Admitting: Pulmonary Disease

## 2011-03-03 MED ORDER — FUROSEMIDE 40 MG PO TABS: 40.0000 mg | ORAL_TABLET | Freq: Every day | ORAL | Status: DC

## 2011-03-19 ENCOUNTER — Encounter: Payer: Self-pay | Admitting: Orthopedic Surgery

## 2011-03-19 ENCOUNTER — Ambulatory Visit (INDEPENDENT_AMBULATORY_CARE_PROVIDER_SITE_OTHER): Payer: Medicare Other | Admitting: Orthopedic Surgery

## 2011-03-19 VITALS — BP 100/70 | Ht 66.0 in | Wt 192.2 lb

## 2011-03-19 DIAGNOSIS — M1710 Unilateral primary osteoarthritis, unspecified knee: Secondary | ICD-10-CM

## 2011-03-19 MED ORDER — METHYLPREDNISOLONE ACETATE 40 MG/ML IJ SUSP: 40.0000 mg | Freq: Once | INTRAMUSCULAR | Status: DC

## 2011-03-19 NOTE — Patient Instructions (Signed)
You have received a steroid shot. 15% of patients experience increased pain at the injection site with in the next 24 hours. This is best treated with ice and tylenol extra strength 2 tabs every 8 hours. If you are still having pain please call the office.    

## 2011-03-19 NOTE — Progress Notes (Signed)
   Scheduled bilateral knee injections  Knee  Injection Procedure Note  Pre-operative Diagnosis: right knee oa  Post-operative Diagnosis: same  Indications: pain  Anesthesia: ethyl chloride   Procedure Details   Verbal consent was obtained for the procedure. Time out was completed.The joint was prepped with alcohol, followed by  Ethyl chloride spray and A 20 gauge needle was inserted into the knee via lateral approach; 4ml 1% lidocaine and 1 ml of depomedrol  was then injected into the joint . The needle was removed and the area cleansed and dressed.  Complications:  None; patient tolerated the procedure well.  Knee  Injection Procedure Note  Pre-operative Diagnosis: left knee oa  Post-operative Diagnosis: same  Indications: pain  Anesthesia: ethyl chloride   Procedure Details   Verbal consent was obtained for the procedure. Time out was completed.The joint was prepped with alcohol, followed by  Ethyl chloride spray and A 20 gauge needle was inserted into the knee via lateral approach; 4ml 1% lidocaine and 1 ml of depomedrol  was then injected into the joint . The needle was removed and the area cleansed and dressed.  Complications:  None; patient tolerated the procedure well.

## 2011-03-20 ENCOUNTER — Other Ambulatory Visit: Payer: Self-pay

## 2011-03-20 ENCOUNTER — Encounter (HOSPITAL_COMMUNITY): Payer: Self-pay | Admitting: *Deleted

## 2011-03-20 ENCOUNTER — Emergency Department (HOSPITAL_COMMUNITY): Payer: Medicare Other

## 2011-03-20 ENCOUNTER — Inpatient Hospital Stay (HOSPITAL_COMMUNITY)
Admission: EM | Admit: 2011-03-20 | Discharge: 2011-03-23 | Disposition: A | Discharge status: Hospice | Payer: Medicare Other | Source: Home / Self Care | Attending: Internal Medicine | Admitting: Internal Medicine

## 2011-03-20 DIAGNOSIS — Z9849 Cataract extraction status, unspecified eye: Secondary | ICD-10-CM

## 2011-03-20 DIAGNOSIS — E785 Hyperlipidemia, unspecified: Secondary | ICD-10-CM | POA: Diagnosis present

## 2011-03-20 DIAGNOSIS — Z23 Encounter for immunization: Secondary | ICD-10-CM

## 2011-03-20 DIAGNOSIS — I517 Cardiomegaly: Secondary | ICD-10-CM | POA: Diagnosis present

## 2011-03-20 DIAGNOSIS — K5900 Constipation, unspecified: Secondary | ICD-10-CM | POA: Diagnosis not present

## 2011-03-20 DIAGNOSIS — N390 Urinary tract infection, site not specified: Secondary | ICD-10-CM | POA: Diagnosis present

## 2011-03-20 DIAGNOSIS — I214 Non-ST elevation (NSTEMI) myocardial infarction: Principal | ICD-10-CM | POA: Diagnosis present

## 2011-03-20 DIAGNOSIS — R002 Palpitations: Secondary | ICD-10-CM | POA: Diagnosis present

## 2011-03-20 DIAGNOSIS — Z7902 Long term (current) use of antithrombotics/antiplatelets: Secondary | ICD-10-CM

## 2011-03-20 DIAGNOSIS — R079 Chest pain, unspecified: Secondary | ICD-10-CM | POA: Diagnosis present

## 2011-03-20 DIAGNOSIS — K219 Gastro-esophageal reflux disease without esophagitis: Secondary | ICD-10-CM | POA: Diagnosis present

## 2011-03-20 DIAGNOSIS — IMO0001 Reserved for inherently not codable concepts without codable children: Secondary | ICD-10-CM | POA: Diagnosis present

## 2011-03-20 DIAGNOSIS — E119 Type 2 diabetes mellitus without complications: Secondary | ICD-10-CM | POA: Diagnosis present

## 2011-03-20 DIAGNOSIS — D649 Anemia, unspecified: Secondary | ICD-10-CM | POA: Diagnosis present

## 2011-03-20 DIAGNOSIS — I079 Rheumatic tricuspid valve disease, unspecified: Secondary | ICD-10-CM | POA: Diagnosis present

## 2011-03-20 DIAGNOSIS — Z79899 Other long term (current) drug therapy: Secondary | ICD-10-CM

## 2011-03-20 DIAGNOSIS — E559 Vitamin D deficiency, unspecified: Secondary | ICD-10-CM | POA: Diagnosis present

## 2011-03-20 DIAGNOSIS — I2510 Atherosclerotic heart disease of native coronary artery without angina pectoris: Secondary | ICD-10-CM | POA: Diagnosis present

## 2011-03-20 DIAGNOSIS — I5033 Acute on chronic diastolic (congestive) heart failure: Secondary | ICD-10-CM | POA: Diagnosis not present

## 2011-03-20 DIAGNOSIS — I10 Essential (primary) hypertension: Secondary | ICD-10-CM | POA: Diagnosis present

## 2011-03-20 DIAGNOSIS — F411 Generalized anxiety disorder: Secondary | ICD-10-CM | POA: Diagnosis present

## 2011-03-20 DIAGNOSIS — I509 Heart failure, unspecified: Secondary | ICD-10-CM | POA: Diagnosis not present

## 2011-03-20 DIAGNOSIS — I219 Acute myocardial infarction, unspecified: Secondary | ICD-10-CM

## 2011-03-20 DIAGNOSIS — I080 Rheumatic disorders of both mitral and aortic valves: Secondary | ICD-10-CM | POA: Diagnosis present

## 2011-03-20 DIAGNOSIS — Z7982 Long term (current) use of aspirin: Secondary | ICD-10-CM

## 2011-03-20 DIAGNOSIS — Z882 Allergy status to sulfonamides status: Secondary | ICD-10-CM

## 2011-03-20 DIAGNOSIS — K5730 Diverticulosis of large intestine without perforation or abscess without bleeding: Secondary | ICD-10-CM | POA: Diagnosis present

## 2011-03-20 DIAGNOSIS — I872 Venous insufficiency (chronic) (peripheral): Secondary | ICD-10-CM | POA: Diagnosis present

## 2011-03-20 DIAGNOSIS — D696 Thrombocytopenia, unspecified: Secondary | ICD-10-CM | POA: Diagnosis not present

## 2011-03-20 DIAGNOSIS — R630 Anorexia: Secondary | ICD-10-CM | POA: Diagnosis not present

## 2011-03-20 DIAGNOSIS — M1990 Unspecified osteoarthritis, unspecified site: Secondary | ICD-10-CM | POA: Diagnosis present

## 2011-03-20 DIAGNOSIS — K589 Irritable bowel syndrome without diarrhea: Secondary | ICD-10-CM | POA: Diagnosis present

## 2011-03-20 DIAGNOSIS — I739 Peripheral vascular disease, unspecified: Secondary | ICD-10-CM | POA: Diagnosis present

## 2011-03-20 DIAGNOSIS — J45909 Unspecified asthma, uncomplicated: Secondary | ICD-10-CM | POA: Diagnosis present

## 2011-03-20 DIAGNOSIS — D869 Sarcoidosis, unspecified: Secondary | ICD-10-CM | POA: Diagnosis present

## 2011-03-20 DIAGNOSIS — I959 Hypotension, unspecified: Secondary | ICD-10-CM | POA: Diagnosis present

## 2011-03-20 DIAGNOSIS — Z880 Allergy status to penicillin: Secondary | ICD-10-CM

## 2011-03-20 LAB — CBC
HCT: 33.1 % — ABNORMAL LOW (ref 36.0–46.0)
Hemoglobin: 11.3 g/dL — ABNORMAL LOW (ref 12.0–15.0)
MCH: 29.7 pg (ref 26.0–34.0)
MCHC: 34.1 g/dL (ref 30.0–36.0)
MCV: 87.1 fL (ref 78.0–100.0)

## 2011-03-20 LAB — BASIC METABOLIC PANEL
BUN: 42 mg/dL — ABNORMAL HIGH (ref 6–23)
Calcium: 9.8 mg/dL (ref 8.4–10.5)
Creatinine, Ser: 1.35 mg/dL — ABNORMAL HIGH (ref 0.50–1.10)
GFR calc non Af Amer: 35 mL/min — ABNORMAL LOW (ref >90)
Glucose, Bld: 128 mg/dL — ABNORMAL HIGH (ref 70–99)
Sodium: 136 mEq/L (ref 135–145)

## 2011-03-20 LAB — POCT I-STAT TROPONIN I

## 2011-03-20 MED ORDER — NITROGLYCERIN 0.4 MG SL SUBL
0.4000 mg | SUBLINGUAL_TABLET | SUBLINGUAL | Status: DC | PRN
  Administered 2011-03-20 – 2011-03-23 (×7): 0.4 mg via SUBLINGUAL
  Filled 2011-03-20 (×2): qty 25

## 2011-03-20 MED ORDER — MORPHINE SULFATE 2 MG/ML IJ SOLN
INTRAMUSCULAR | Status: AC
  Filled 2011-03-20: qty 1

## 2011-03-20 MED ORDER — ASPIRIN 81 MG PO CHEW
324.0000 mg | CHEWABLE_TABLET | Freq: Once | ORAL | Status: AC
  Administered 2011-03-20: 324 mg via ORAL
  Filled 2011-03-20: qty 4

## 2011-03-20 MED ORDER — MORPHINE SULFATE 2 MG/ML IJ SOLN
2.0000 mg | Freq: Once | INTRAMUSCULAR | Status: AC
  Administered 2011-03-20 – 2011-03-21 (×2): 2 mg via INTRAVENOUS

## 2011-03-20 NOTE — ED Notes (Signed)
Pt stating increased chest pain dr Adriana Simas notified

## 2011-03-20 NOTE — ED Provider Notes (Signed)
Scribed for Donnetta Hutching, MD, the patient was seen in room APA10/APA10. This chart was scribed by [employer]. The patient's care started at 21:06  CSN: [id number] Arrival date & time: 03/20/2011  8:58 PM   First MD Initiated Contact with Patient 03/20/11 2106      Chief Complaint  Patient presents with  . Chest Pain  . Shoulder Pain  . Sore Throat   Chest Pain Primary symptoms include vomiting.    HPI  HPI Alexandra [name] is a 77 y.o. female who presents to the Emergency Department complaining Chest Pain. Pain is worse with exertion and alleviated with laying calm. Patient reports that pain radiates to the arms, the upper back, both arms and to the neck. Patient describes pain as sharp. Patient reports vomiting but denies shortness of breath or diaphoresis. Denies taking nitroglycerine but reports taking her blood pressure medicine.  Past Medical History  Diagnosis Date  . Asthmatic bronchitis   . Sarcoidosis   . HTN (hypertension)   . PVD (peripheral vascular disease)   . Venous insufficiency   . Hyperlipemia   . DM (diabetes mellitus)   . Morbid obesity   . GERD (gastroesophageal reflux disease)   . Diverticulosis of colon   . IBS (irritable bowel syndrome)   . DJD (degenerative joint disease)   . Vitamin D deficiency disease   . Fibromyalgia   . Anxiety   . Intermittent vertigo   . Anemia   . Diverticulosis of colon   . Knee pain   . Hip pain   . Anserine bursitis   . Bursitis of hip   . Palpitations   . Patellar subluxation     malaignment     Past Surgical History  Procedure Date  . Cholecystectomy   . Tonsillectomy   . Hernia repair   . Cataract extraction     Family History  Problem Relation Age of Onset  . Heart failure Father   . Leukemia Mother     History  Substance Use Topics  . Smoking status: Never Smoker   . Smokeless tobacco: Never Used  . Alcohol Use: Yes     social    OB History    Grav Para Term Preterm Abortions TAB SAB  Ect Mult Living                  Review of Systems  HENT: Positive for neck pain.   Cardiovascular: Positive for chest pain.  Gastrointestinal: Positive for vomiting.  Musculoskeletal:       Shoulder pain  All other systems reviewed and are negative.    Allergies  Codeine; Diazepam; Lipitor; Penicillins; and Sulfonamide derivatives  Home Medications   Current Outpatient Rx  Name Route Sig Dispense Refill  . ACETAMINOPHEN 500 MG PO TABS Oral Take 500 mg by mouth every 6 (six) hours as needed.      [name] AMITRIPTYLINE HCL 10 MG PO TABS Oral Take 10 mg by mouth at bedtime. One po daily     . VITAMIN C 500 MG PO TABS Oral Take 500 mg by mouth daily. Take one po bid with iron tablet     . ASPIRIN 81 MG PO TABS  Two tablets  po once per day    . AVAPRO 150 MG PO TABS  TAKE (1) TABLET BY MOUTH ONCE DAILY. 30 each 6  . VITAMIN D 1000 UNITS PO CAPS Oral Take 1,000 Units by mouth daily. One po qd     .  VITAMIN B 12 100 MCG PO LOZG Oral Take by mouth.      Di Kindle SULFATE 324 (65 FE) MG PO TBEC Oral Take by mouth. One po bid     . FUROSEMIDE 40 MG PO TABS Oral Take 1 tablet (40 mg total) by mouth daily. One po qd 30 tablet 6  . GLUCOSE BLOOD VI STRP Other 1 each by Other route as needed. Use as instructed     . MECLIZINE HCL 25 MG PO TABS  TAKE 1 TABLET BY MOUTH EVERY 4 HOURS AS NEEDED FOR  DIZZINESS. 50 tablet 5  . METFORMIN HCL 500 MG PO TABS  TAKE 1 TABLET TWICE DAILY 60 tablet 3  . METOPROLOL TARTRATE 50 MG PO TABS Oral Take 1 tablet (50 mg total) by mouth 2 (two) times daily. 60 tablet 11  . PLAVIX 75 MG PO TABS  TAKE 1 TABLET ONCE DAILY. 30 each 6  . VISTARIL 25 MG PO CAPS  TAKE (1) CAPSULE EVERY SIX HOURS AS NEEDED. 50 each 5  . ZOCOR 40 MG PO TABS  TAKE ONE TABLET BY MOUTH AT BEDTIME. 30 each 11    BP 132/59  Pulse 83  Temp(Src) 97.4 F (36.3 C) (Oral)  Resp 20  Wt 190 lb (86.183 kg)  SpO2 95%  Physical Exam  Nursing note and vitals reviewed. Constitutional: She is  oriented to person, place, and time. She appears well-developed and well-nourished. No distress.       Obese  HENT:  Head: Normocephalic and atraumatic.  Mouth/Throat: Oropharynx is clear and moist. No oropharyngeal exudate.  Eyes: Conjunctivae are normal. Right eye exhibits no discharge. Left eye exhibits no discharge.  Neck: Neck supple. No JVD present. No tracheal deviation present.  Cardiovascular: Normal rate, regular rhythm and normal heart sounds.   No murmur heard. Pulmonary/Chest: Effort normal. No respiratory distress. She has no wheezes.  Abdominal: Soft. There is no tenderness. There is no rebound.  Musculoskeletal: She exhibits no tenderness.       Baseline ROM, no obvious new focal weakness.  Neurological: She is alert and oriented to person, place, and time. No cranial nerve deficit.       Mental status and motor strength appears baseline for patient and situation.  Skin: Skin is warm and dry. No rash noted. She is not diaphoretic. No erythema.  Psychiatric: She has a normal mood and affect. Her behavior is normal.    ED Course  Procedures   DIAGNOSTIC STUDIES: Oxygen Saturation is 95% on [place], normal by my interpretation.    COORDINATION OF CARE:  21:15 - EDP examined patient at bedside, ordered cardiac work up, IV fluids blood work and imaging.  Orders Placed This Encounter  Procedures  . DG Chest Portable 1 View  . CBC  . Basic metabolic panel  . Cardiac panel(cret kin+cktot+mb+tropi)  . Cardiac monitoring  . Weigh patient  . Pulse oximetry, continuous  . Oxygen therapy  . Pulse oximetry, continuous  . ED EKG (<25mins upon arrival to the ED)  . Saline lock IV     Results for orders placed during the hospital encounter of 03/20/11  CBC      Component Value Range   WBC 12.4 (*) 4.0 - 10.5 (K/uL)   RBC 3.80 (*) 3.87 - 5.11 (MIL/uL)   Hemoglobin 11.3 (*) 12.0 - 15.0 (g/dL)   HCT 11.9 (*) 14.7 - 46.0 (%)   MCV 87.1  78.0 - 100.0 (fL)   MCH 29.7  26.0 -  34.0 (pg)   MCHC 34.1  30.0 - 36.0 (g/dL)   RDW 69.6  29.5 - 28.4 (%)   Platelets 220  150 - 400 (K/uL)  BASIC METABOLIC PANEL      Component Value Range   Sodium 136  135 - 145 (mEq/L)   Potassium 4.1  3.5 - 5.1 (mEq/L)   Chloride 94 (*) 96 - 112 (mEq/L)   CO2 28  19 - 32 (mEq/L)   Glucose, Bld 128 (*) 70 - 99 (mg/dL)   BUN 42 (*) 6 - 23 (mg/dL)   Creatinine, Ser 1.32 (*) 0.50 - 1.10 (mg/dL)   Calcium 9.8  8.4 - 44.0 (mg/dL)   GFR calc non Af Amer 35 (*) >90 (mL/min)   GFR calc Af Amer 40 (*) >90 (mL/min)  CARDIAC PANEL(CRET KIN+CKTOT+MB+TROPI)      Component Value Range   Total CK 68  7 - 177 (U/L)   CK, MB 3.3  0.3 - 4.0 (ng/mL)   Troponin I <0.30  <0.30 (ng/mL)   Relative Index RELATIVE INDEX IS INVALID  0.0 - 2.5   POCT I-STAT TROPONIN I      Component Value Range   Troponin i, poc 0.04  0.00 - 0.08 (ng/mL)   Comment 3            Dg Chest Portable 1 View  03/20/2011  *RADIOLOGY REPORT*  Clinical Data: Chest pain  PORTABLE CHEST - 1 VIEW  Comparison: Chest radiograph 07/22/2010  Findings: Stable enlarged heart silhouette.  No effusion, infiltrate, or pneumothorax.  IMPRESSION: No cardiomegaly without acute cardiopulmonary process.  Original Report Authenticated By: Genevive Bi, M.D.     . Date: 03/20/2011  Rate: 86  Rhythm: normal sinus rhythm  QRS Axis: normal  Intervals: normal  ST/T Wave abnormalities: normal  Conduction Disutrbances:none  Narrative Interpretation:   Old EKG Reviewed: none available c sinus arrhythmia  I personally performed the services described in this documentation, which was scribed in my presence. The recorded information has been reviewed and considered. MDM: Patient with known coronary disease. The persistent chest pain getting worse. Rx by mouth aspirin, sublingual nitroglycerin, admit to hospital discussed with hospitalist.   CRITICAL CARE Performed by: Donnetta Hutching   Total critical care time: 30  Critical care time was exclusive  of separately billable procedures and treating other patients.  Critical care was necessary to treat or prevent imminent or life-threatening deterioration.  Critical care was time spent personally by me on the following activities: development of treatment plan with patient and/or surrogate as well as nursing, discussions with consultants, evaluation of patient's response to treatment, examination of patient, obtaining history from patient or surrogate, ordering and performing treatments and interventions, ordering and review of laboratory studies, ordering and review of radiographic studies, pulse oximetry and re-evaluation of patient's condition.              Critical care was necessary to treat or prevent imminent or life-threatening deterioration.  Critical care was time spent personally by me on the following activities: development of treatment plan with patient and/or surrogate as well as nursing, discussions with consultants, evaluation of patient's response to treatment, examination of patient, obtaining history from patient or surrogate, ordering and performing treatments and interventions, ordering and review of laboratory studies, ordering and review of radiographic studies, pulse oximetry and re-evaluation of patient's condition.  Donnetta Hutching, MD 03/20/11 2337

## 2011-03-20 NOTE — ED Notes (Signed)
Pt stating pain relief after morphine rating pain 2/10

## 2011-03-20 NOTE — ED Notes (Signed)
Pt c/o chest pain, shoulder pain, throat pain, and pain radiating down left arm x 2 days. Pt also has n/v

## 2011-03-21 ENCOUNTER — Other Ambulatory Visit: Payer: Self-pay

## 2011-03-21 DIAGNOSIS — N390 Urinary tract infection, site not specified: Secondary | ICD-10-CM | POA: Diagnosis present

## 2011-03-21 DIAGNOSIS — R079 Chest pain, unspecified: Secondary | ICD-10-CM | POA: Diagnosis present

## 2011-03-21 LAB — TSH: TSH: 4.64 u[IU]/mL — ABNORMAL HIGH (ref 0.350–4.500)

## 2011-03-21 LAB — CBC
Hemoglobin: 10.2 g/dL — ABNORMAL LOW (ref 12.0–15.0)
MCH: 29.7 pg (ref 26.0–34.0)
MCV: 87.8 fL (ref 78.0–100.0)
Platelets: 157 10*3/uL (ref 150–400)
RBC: 3.44 MIL/uL — ABNORMAL LOW (ref 3.87–5.11)
WBC: 11.3 10*3/uL — ABNORMAL HIGH (ref 4.0–10.5)

## 2011-03-21 LAB — GLUCOSE, CAPILLARY
Glucose-Capillary: 130 mg/dL — ABNORMAL HIGH (ref 70–99)
Glucose-Capillary: 129 mg/dL — ABNORMAL HIGH (ref 70–99)
Glucose-Capillary: 112 mg/dL — ABNORMAL HIGH (ref 70–99)

## 2011-03-21 LAB — URINALYSIS, ROUTINE W REFLEX MICROSCOPIC
Ketones, ur: NEGATIVE mg/dL
Protein, ur: NEGATIVE mg/dL
Urobilinogen, UA: 0.2 mg/dL (ref 0.0–1.0)

## 2011-03-21 LAB — COMPREHENSIVE METABOLIC PANEL
ALT: 10 U/L (ref 0–35)
AST: 35 U/L (ref 0–37)
Calcium: 9 mg/dL (ref 8.4–10.5)
Creatinine, Ser: 1.2 mg/dL — ABNORMAL HIGH (ref 0.50–1.10)
GFR calc Af Amer: 46 mL/min — ABNORMAL LOW (ref >90)
Glucose, Bld: 116 mg/dL — ABNORMAL HIGH (ref 70–99)
Sodium: 138 mEq/L (ref 135–145)
Total Protein: 6.5 g/dL (ref 6.0–8.3)

## 2011-03-21 LAB — CARDIAC PANEL(CRET KIN+CKTOT+MB+TROPI)
CK, MB: 27.7 ng/mL (ref 0.3–4.0)
Total CK: 308 U/L — ABNORMAL HIGH (ref 7–177)
Troponin I: 9.52 ng/mL (ref <0.30)

## 2011-03-21 LAB — URINE MICROSCOPIC-ADD ON

## 2011-03-21 LAB — HEMOGLOBIN A1C: Mean Plasma Glucose: 134 mg/dL — ABNORMAL HIGH (ref <117)

## 2011-03-21 MED ORDER — MORPHINE SULFATE 4 MG/ML IJ SOLN
INTRAMUSCULAR | Status: AC
  Administered 2011-03-21: 2 mg via INTRAVENOUS
  Filled 2011-03-21: qty 1

## 2011-03-21 MED ORDER — GI COCKTAIL ~~LOC~~
30.0000 mL | Freq: Three times a day (TID) | ORAL | Status: DC | PRN
  Administered 2011-03-21: 30 mL via ORAL
  Filled 2011-03-21: qty 30

## 2011-03-21 MED ORDER — PANTOPRAZOLE SODIUM 40 MG PO TBEC
40.0000 mg | DELAYED_RELEASE_TABLET | Freq: Every day | ORAL | Status: DC
  Administered 2011-03-22: 40 mg via ORAL
  Filled 2011-03-21: qty 1

## 2011-03-21 MED ORDER — OLMESARTAN MEDOXOMIL 20 MG PO TABS
20.0000 mg | ORAL_TABLET | Freq: Every day | ORAL | Status: DC
  Administered 2011-03-21: 20 mg via ORAL
  Filled 2011-03-21: qty 1

## 2011-03-21 MED ORDER — BISACODYL 10 MG RE SUPP: 10.0000 mg | RECTAL | Status: DC | PRN

## 2011-03-21 MED ORDER — ASPIRIN 325 MG PO TABS
325.0000 mg | ORAL_TABLET | Freq: Every day | ORAL | Status: DC
  Administered 2011-03-21 – 2011-03-23 (×3): 325 mg via ORAL
  Filled 2011-03-21 (×3): qty 1

## 2011-03-21 MED ORDER — METOPROLOL TARTRATE 50 MG PO TABS
50.0000 mg | ORAL_TABLET | Freq: Two times a day (BID) | ORAL | Status: DC
  Administered 2011-03-21 (×2): 50 mg via ORAL
  Filled 2011-03-21 (×2): qty 1

## 2011-03-21 MED ORDER — ACETAMINOPHEN 650 MG RE SUPP: 650.0000 mg | Freq: Four times a day (QID) | RECTAL | Status: DC | PRN

## 2011-03-21 MED ORDER — CLOPIDOGREL BISULFATE 75 MG PO TABS
75.0000 mg | ORAL_TABLET | Freq: Every day | ORAL | Status: DC
  Administered 2011-03-21 – 2011-03-23 (×3): 75 mg via ORAL
  Filled 2011-03-21 (×3): qty 1

## 2011-03-21 MED ORDER — INSULIN ASPART 100 UNIT/ML ~~LOC~~ SOLN
0.0000 [IU] | Freq: Three times a day (TID) | SUBCUTANEOUS | Status: DC
  Administered 2011-03-21: 2 [IU] via SUBCUTANEOUS
  Administered 2011-03-21 – 2011-03-22 (×4): 1 [IU] via SUBCUTANEOUS
  Administered 2011-03-23: 2 [IU] via SUBCUTANEOUS
  Filled 2011-03-21: qty 3

## 2011-03-21 MED ORDER — SENNOSIDES-DOCUSATE SODIUM 8.6-50 MG PO TABS: 1.0000 | ORAL_TABLET | Freq: Every day | ORAL | Status: DC | PRN

## 2011-03-21 MED ORDER — ACETAMINOPHEN 325 MG PO TABS
650.0000 mg | ORAL_TABLET | Freq: Four times a day (QID) | ORAL | Status: DC | PRN
  Administered 2011-03-21 – 2011-03-22 (×2): 650 mg via ORAL
  Filled 2011-03-21 (×2): qty 2

## 2011-03-21 MED ORDER — ONDANSETRON HCL 4 MG/2ML IJ SOLN
4.0000 mg | Freq: Four times a day (QID) | INTRAMUSCULAR | Status: DC | PRN
  Administered 2011-03-21 – 2011-03-22 (×5): 4 mg via INTRAVENOUS
  Filled 2011-03-21 (×6): qty 2

## 2011-03-21 MED ORDER — DOCUSATE SODIUM 100 MG PO CAPS: 100.0000 mg | ORAL_CAPSULE | Freq: Two times a day (BID) | ORAL | Status: DC | PRN

## 2011-03-21 MED ORDER — ENOXAPARIN SODIUM 40 MG/0.4ML ~~LOC~~ SOLN: 30.0000 mg | Freq: Every day | SUBCUTANEOUS | Status: DC

## 2011-03-21 MED ORDER — POTASSIUM CHLORIDE IN NACL 20-0.9 MEQ/L-% IV SOLN
INTRAVENOUS | Status: DC
  Administered 2011-03-21: 20 mL/h via INTRAVENOUS
  Administered 2011-03-21: 03:00:00 via INTRAVENOUS

## 2011-03-21 MED ORDER — SODIUM CHLORIDE 0.9 % IJ SOLN
INTRAMUSCULAR | Status: AC
  Administered 2011-03-21: 3 mL
  Filled 2011-03-21: qty 3

## 2011-03-21 MED ORDER — MORPHINE SULFATE 2 MG/ML IJ SOLN: 1.0000 mg | Freq: Once | INTRAMUSCULAR | Status: AC

## 2011-03-21 MED ORDER — MORPHINE SULFATE 2 MG/ML IJ SOLN
2.0000 mg | INTRAMUSCULAR | Status: DC | PRN
  Administered 2011-03-21 – 2011-03-22 (×4): 2 mg via INTRAVENOUS
  Filled 2011-03-21 (×4): qty 1

## 2011-03-21 MED ORDER — SODIUM CHLORIDE 0.9 % IJ SOLN
INTRAMUSCULAR | Status: AC
  Filled 2011-03-21: qty 10

## 2011-03-21 MED ORDER — SIMVASTATIN 20 MG PO TABS
40.0000 mg | ORAL_TABLET | Freq: Every day | ORAL | Status: DC
  Administered 2011-03-21 – 2011-03-22 (×2): 40 mg via ORAL
  Filled 2011-03-21 (×2): qty 2

## 2011-03-21 MED ORDER — FLEET ENEMA 7-19 GM/118ML RE ENEM: 1.0000 | ENEMA | RECTAL | Status: DC | PRN

## 2011-03-21 MED ORDER — TRAZODONE HCL 50 MG PO TABS: 25.0000 mg | ORAL_TABLET | Freq: Every evening | ORAL | Status: DC | PRN

## 2011-03-21 MED ORDER — NITROGLYCERIN IN D5W 200-5 MCG/ML-% IV SOLN
5.0000 ug/min | INTRAVENOUS | Status: DC
  Administered 2011-03-21: 5 ug/min via INTRAVENOUS
  Filled 2011-03-21: qty 250

## 2011-03-21 MED ORDER — PNEUMOCOCCAL VAC POLYVALENT 25 MCG/0.5ML IJ INJ
0.5000 mL | INJECTION | INTRAMUSCULAR | Status: AC
  Filled 2011-03-21: qty 0.5

## 2011-03-21 MED ORDER — ONDANSETRON HCL 4 MG PO TABS
4.0000 mg | ORAL_TABLET | Freq: Four times a day (QID) | ORAL | Status: DC | PRN
  Administered 2011-03-23: 4 mg via ORAL
  Filled 2011-03-21: qty 1

## 2011-03-21 MED ORDER — PANTOPRAZOLE SODIUM 40 MG IV SOLR
40.0000 mg | Freq: Once | INTRAVENOUS | Status: AC
  Administered 2011-03-21: 40 mg via INTRAVENOUS
  Filled 2011-03-21: qty 40

## 2011-03-21 MED ORDER — INSULIN ASPART 100 UNIT/ML ~~LOC~~ SOLN: 0.0000 [IU] | Freq: Every day | SUBCUTANEOUS | Status: DC

## 2011-03-21 MED ORDER — MORPHINE SULFATE 4 MG/ML IJ SOLN
INTRAMUSCULAR | Status: AC
  Filled 2011-03-21: qty 1

## 2011-03-21 MED ORDER — SIMETHICONE 80 MG PO CHEW
80.0000 mg | CHEWABLE_TABLET | Freq: Four times a day (QID) | ORAL | Status: DC | PRN
  Administered 2011-03-21 – 2011-03-22 (×2): 80 mg via ORAL
  Filled 2011-03-21 (×2): qty 1

## 2011-03-21 MED ORDER — GI COCKTAIL ~~LOC~~
ORAL | Status: AC
  Filled 2011-03-21: qty 30

## 2011-03-21 MED ORDER — ENOXAPARIN SODIUM 100 MG/ML ~~LOC~~ SOLN
1.0000 mg/kg | Freq: Two times a day (BID) | SUBCUTANEOUS | Status: DC
  Administered 2011-03-21 – 2011-03-22 (×4): 90 mg via SUBCUTANEOUS
  Filled 2011-03-21 (×9): qty 1

## 2011-03-21 NOTE — Progress Notes (Signed)
ANTICOAGULATION CONSULT NOTE - Initial Consult  Pharmacy Consult for Lovenox Indication: chest pain/ACS  Allergies  Allergen Reactions  . Codeine   . Diazepam     REACTION: causes pt unable to wake up after taking  . Lipitor (Atorvastatin Calcium)   . Penicillins   . Sulfonamide Derivatives     Patient Measurements: Height: 5\' 5"  (165.1 cm) Weight: 192 lb 14.4 oz (87.5 kg) IBW/kg (Calculated) : 57   Vital Signs: Temp: 98.2 F (36.8 C) (10/20 0532) Temp src: Oral (10/20 0532) BP: 133/74 mmHg (10/20 0905) Pulse Rate: 128  (10/20 0905)  Labs:  Basename 03/21/11 0658 03/20/11 2110  HGB 10.2* 11.3*  HCT 30.2* 33.1*  PLT 157 220  APTT -- --  LABPROT -- --  INR -- --  HEPARINUNFRC -- --  CREATININE 1.20* 1.35*  CKTOTAL 236* 68  CKMB 16.7* 3.3  TROPONINI 6.24* <0.30   Estimated Creatinine Clearance: 37.4 ml/min (by C-G formula based on Cr of 1.2).  Medical History: Past Medical History  Diagnosis Date  . Asthmatic bronchitis   . Sarcoidosis   . HTN (hypertension)   . PVD (peripheral vascular disease)   . Venous insufficiency   . Hyperlipemia   . DM (diabetes mellitus)   . Morbid obesity   . GERD (gastroesophageal reflux disease)   . Diverticulosis of colon   . IBS (irritable bowel syndrome)   . DJD (degenerative joint disease)   . Vitamin D deficiency disease   . Fibromyalgia   . Anxiety   . Intermittent vertigo   . Anemia   . Diverticulosis of colon   . Knee pain   . Hip pain   . Anserine bursitis   . Bursitis of hip   . Palpitations   . Patellar subluxation     malaignment     Medications:  Scheduled:    . aspirin  324 mg Oral Once  . aspirin  325 mg Oral Daily  . clopidogrel  75 mg Oral Q breakfast  . enoxaparin  1 mg/kg Subcutaneous Q12H  . insulin aspart  0-5 Units Subcutaneous QHS  . insulin aspart  0-9 Units Subcutaneous TID WC  . metoprolol tartrate  50 mg Oral BID  . morphine  2 mg Intravenous Once  . morphine      . olmesartan   20 mg Oral Daily  . pantoprazole  40 mg Oral Q1200  . pantoprazole (PROTONIX) IV  40 mg Intravenous Once  . pneumococcal 23 valent vaccine  0.5 mL Intramuscular Tomorrow-1000  . simvastatin  40 mg Oral q1800  . sodium chloride      . DISCONTD: enoxaparin  30 mg Subcutaneous Daily    Assessment: Okay for Protocol  Goal of Therapy:  Treatment Dose   Plan:  Lovenox treatment dose.  Check CBC every 3 days. Check INR  Mady Gemma 03/21/2011,10:15 AM

## 2011-03-21 NOTE — Progress Notes (Signed)
Subjective: The patient is a very pleasant 77 year old female who was admitted last night with complaints of chest pain. Her complete history and physical as well as multiple office notes have been reviewed by the dictating physician. This morning she has had a recurrence of her pain. The pain is located primarily at the base of the neck and spreads down into the chest and a leftward and rightward direction. It is accompanied by a sensation of "fullness" in the chest as well as a sensation of shortness of breath. This pain has been made much worse by physical exertion but is presently occurring as she lays in bed. It improved somewhat with nitroglycerin sublingual. Morphine has also helped in subsiding the pain. She denies fevers chills nausea vomiting abdominal pain.  I have had a lengthy discussion with Alexandra [name] concerning goals of care. She reports to me that she has a living will and that accordingly she would not wish to be intubated defibrillated or put through CPR. She does however wish to have aggressive medical care short of these measures. She is alert and oriented and in my judgment competent to make her own decisions at the present time.  Objective: Weight change:   Intake/Output Summary (Last 24 hours) at 03/21/11 0944 Last data filed at 03/21/11 0700  Gross per 24 hour  Intake    375 ml  Output    300 ml  Net     75 ml   General: No acute respiratory distress Lungs: Clear to auscultation bilaterally but without wheezes or rhonchi Cardiovascular: Irregularly irregular rhythm with regular rate without gallop or rub or appreciable murmur Abdomen: Nontender nondistended soft bowel sounds present no organomegaly mildly overweight Extremities: No significant cyanosis clubbing or edema bilateral lower sugars Neuro: The patient is alert and oriented x4, cranial nerves II through XII are intact bilaterally, the neuro exam is nonfocal  Lab Results:  [facility] 03/21/11 0658 03/20/11 2110    NA 138 136  K 4.0 4.1  CL 99 94*  CO2 28 28  GLUCOSE 116* 128*  BUN 41* 42*  CREATININE 1.20* 1.35*  CALCIUM 9.0 9.8  MG 1.3* --  PHOS -- --    Basename 03/21/11 0658  AST 35  ALT 10  ALKPHOS 55  BILITOT 0.3  PROT 6.5  ALBUMIN 3.5   No results found for this basename: LIPASE:2,AMYLASE:2 in the last 72 hours  Basename 03/21/11 0658 03/20/11 2110  WBC 11.3* 12.4*  NEUTROABS -- --  HGB 10.2* 11.3*  HCT 30.2* 33.1*  MCV 87.8 87.1  PLT 157 220    Basename 03/21/11 0658 03/20/11 2110  CKTOTAL 236* 68  CKMB 16.7* 3.3  CKMBINDEX -- --  TROPONINI 6.24* <0.30   No results found for this basename: POCBNP:3 in the last 72 hours No results found for this basename: DDIMER:2 in the last 72 hours No results found for this basename: HGBA1C:2 in the last 72 hours No results found for this basename: CHOL:2,HDL:2,LDLCALC:2,TRIG:2,CHOLHDL:2,LDLDIRECT:2 in the last 72 hours No results found for this basename: TSH,T4TOTAL,FREET3,T3FREE,THYROIDAB in the last 72 hours No results found for this basename: VITAMINB12:2,FOLATE:2,FERRITIN:2,TIBC:2,IRON:2,RETICCTPCT:2 in the last 72 hours  Studies/Results: Dg Chest Portable 1 View  03/20/2011  *RADIOLOGY REPORT*  Clinical Data: Chest pain  PORTABLE CHEST - 1 VIEW  Comparison: Chest radiograph 07/22/2010  Findings: Stable enlarged heart silhouette.  No effusion, infiltrate, or pneumothorax.  IMPRESSION: No cardiomegaly without acute cardiopulmonary process.  Original Report Authenticated By: Genevive Bi, M.D.   Medications: Scheduled Meds:    .  aspirin  324 mg Oral Once  . aspirin  325 mg Oral Daily  . clopidogrel  75 mg Oral Q breakfast  . enoxaparin  1 mg/kg Subcutaneous Q12H  . insulin aspart  0-5 Units Subcutaneous QHS  . insulin aspart  0-9 Units Subcutaneous TID WC  . morphine  2 mg Intravenous Once  . morphine      . pantoprazole  40 mg Oral Q1200  . pantoprazole (PROTONIX) IV  40 mg Intravenous Once  . pneumococcal 23  valent vaccine  0.5 mL Intramuscular Tomorrow-1000  . sodium chloride      . DISCONTD: enoxaparin  30 mg Subcutaneous Daily   Continuous Infusions:    . 0.9 % NaCl with KCl 20 mEq / L 75 mL/hr at 03/21/11 0313  . nitroGLYCERIN     PRN Meds:.acetaminophen, acetaminophen, bisacodyl, docusate sodium, morphine injection, nitroGLYCERIN, ondansetron (ZOFRAN) IV, ondansetron, senna-docusate, sodium phosphate, traZODone  Assessment/Plan:  Chest pain/positive cardiac enzymes I have discussed treatment options in detail with the patient.  I have called and discussed the care of the patient with the cardiologist on call and [facility]. Given the fact that she is reasonably stable at present and that she is a NO CODE BLUE the cardiologist and I had decided that medical treatment is the most appropriate intervention at present time. We will maximize medical therapy.  We will continue to monitor the patient as an inpatient. We will request an inpatient cardiology consult on Monday when it is available here at [facility].   HYPERLIPIDEMIA  we will continue her home medication regimen  ANEMIA  This patient's anemia appears to be a chronic issue likely related to her chronic inflammatory disease. We will follow her hemoglobin trend while she is anticoagulated.  HYPERTENSION At the present time the patient's blood pressure is well controlled. We will continue the present medical regimen assuring that she is treated with a beta blocker and follow her trend.  GERD We will continue the patient's home proton pump inhibitor regimen.  Palpitations The patient's palpitations have been extensively evaluated by her cardiologist at [facility].  At the present time we will continue her outpatient medical regimen and follow her on telemetry.  UTI (lower urinary tract infection) We will administer empiric antibiotics and a followup urine culture.   Sarcoidosis At the present time this  appears to be well controlled/compensated.   Diet controlled diabetes mellitus  We will follow the patient's CBG trend during her hospital stay.     LOS: 1 day   Vyron Fronczak T 03/21/2011, 9:44 AM

## 2011-03-21 NOTE — Progress Notes (Signed)
CRITICAL VALUE ALERT  Critical value received:  MB-16.7; Troponin 6.24  Date of notification: 03/21/11  Time of notification:  0825  Critical value read back:yes  Nurse who received alert: L. Basilia Jumbo, RN  MD notified (1st page): Dr. Sharon Seller  Time of first page: 0826  MD notified (2nd page):  Time of second page:  Responding MD: Time MD responded:  [phone]

## 2011-03-21 NOTE — H&P (Addendum)
PCP:   [name], MD, MD   Chief Complaint:  Chest pain, worse today  HPI: Alexandra [name] is an 77 y.o. female.  Caucasian lady with multiple relatively minor medical issues, which include hypertension and diabetes controlled on diet, and for the past few months is being seen by Dr. Antoine Poche for palpitations in her neck spreading up into her throat and a feeling as if her heart is jumping out of her body and into her hand. Dr. Roxy Cedar note indicates that this is an atrial arrhythmia and she reports that he says he doesn't KNOW what to do about it. She now reports that for the past 2 weeks she's been having a central chest pain on and off, but that since today the chest pain has become much worse and radiating up into her neck and jaw, associated with palpitations and a diet diffuse sweating, difficulty breathing, and dizziness. The CNA who works with her is brought her to the emergency room for further evaluation. In the emergency room the pain has subsided her EKG showed an unusual pattern which seems to be a combination of the junctional and multifocal atrial ectopics, and the hospitalist service was called to assist with management.  She denies fever cough or cold denies vomiting, but has been having epigastric burning which seems to rush up to her throat. Denies  passage of black or bloody stool. Continues to have episodic dizziness many years standing.  Had Depo-Medrol injections in both knees yesterday by Dr. Fuller Canada., And is feeling much better.  Past Medical History  Diagnosis Date  . Asthmatic bronchitis   . Sarcoidosis   . HTN (hypertension)   . PVD (peripheral vascular disease)   . Venous insufficiency   . Hyperlipemia   . DM (diabetes mellitus)   . Morbid obesity   . GERD (gastroesophageal reflux disease)   . Diverticulosis of colon   . IBS (irritable bowel syndrome)   . DJD (degenerative joint disease)   . Vitamin D deficiency disease   . Fibromyalgia   .  Anxiety   . Intermittent vertigo   . Anemia   . Diverticulosis of colon   . Knee pain   . Hip pain   . Anserine bursitis   . Bursitis of hip   . Palpitations   . Patellar subluxation     malaignment     Past Surgical History  Procedure Date  . Cholecystectomy   . Tonsillectomy   . Hernia repair   . Cataract extraction     Medications:  HOME MEDS: Prior to Admission medications   Medication Sig Start Date End Date Taking? Authorizing Provider  acetaminophen (TYLENOL) 500 MG tablet Take 500 mg by mouth every 6 (six) hours as needed.     Yes Historical Provider, MD  amitriptyline (ELAVIL) 10 MG tablet Take 10 mg by mouth at bedtime. One po daily    Yes Historical Provider, MD  Ascorbic Acid (VITAMIN C) 500 MG tablet Take 500 mg by mouth daily. Take one po bid with iron tablet    Yes Historical Provider, MD  aspirin EC 81 MG tablet Take 81 mg by mouth daily.     Yes Historical Provider, MD  AVAPRO 150 MG tablet TAKE (1) TABLET BY MOUTH ONCE DAILY. 03/03/11  Yes [name], MD  Cholecalciferol (VITAMIN D) 1000 UNITS capsule Take 1,000 Units by mouth daily. One po qd    Yes Historical Provider, MD  Cyanocobalamin (VITAMIN B 12) 100 MCG  LOZG Take by mouth.     Yes Historical Provider, MD  ferrous sulfate 324 (65 FE) MG TBEC Take by mouth. One po bid    Yes Historical Provider, MD  furosemide (LASIX) 40 MG tablet Take 1 tablet (40 mg total) by mouth daily. One po qd 03/03/11  Yes [name], MD  glucose blood test strip 1 each by Other route as needed. Use as instructed    Yes Historical Provider, MD  meclizine (ANTIVERT) 25 MG tablet TAKE 1 TABLET BY MOUTH EVERY 4 HOURS AS NEEDED FOR  DIZZINESS. 10/28/10  Yes [name], MD  metFORMIN (GLUCOPHAGE) 500 MG tablet TAKE 1 TABLET TWICE DAILY 12/09/10  Yes [name], MD  metoprolol (LOPRESSOR) 50 MG tablet Take 1 tablet (50 mg total) by mouth 2 (two) times daily. 09/23/10 09/23/11 Yes Rollene Rotunda, MD  PLAVIX 75 MG tablet TAKE 1  TABLET ONCE DAILY. 02/09/11  Yes [name], MD  VISTARIL 25 MG capsule TAKE (1) CAPSULE EVERY SIX HOURS AS NEEDED. 01/06/11  Yes [name], MD  ZOCOR 40 MG tablet TAKE ONE TABLET BY MOUTH AT BEDTIME. 02/26/11  Yes [name], MD  aspirin 81 MG tablet Two tablets  po once per day    Historical Provider, MD    PRIOR TO [facility] MEDS Medications Prior to Admission  Medication Dose Route Frequency Provider Last Rate Last Dose  . aspirin chewable tablet 324 mg  324 mg Oral Once Donnetta Hutching, MD   324 mg at 03/20/11 2124  . morphine 2 MG/ML injection 2 mg  2 mg Intravenous Once Donnetta Hutching, MD   2 mg at 03/20/11 2305  . morphine 2 MG/ML injection           . nitroGLYCERIN (NITROSTAT) SL tablet 0.4 mg  0.4 mg Sublingual Q5 min PRN Donnetta Hutching, MD   0.4 mg at 03/20/11 2155  . pneumococcal 23 valent vaccine (PNU-IMMUNE) injection 0.5 mL  0.5 mL Intramuscular Tomorrow-1000 Vania Rea       Medications Prior to Admission  Medication Sig Dispense Refill  . acetaminophen (TYLENOL) 500 MG tablet Take 500 mg by mouth every 6 (six) hours as needed.        [name] amitriptyline (ELAVIL) 10 MG tablet Take 10 mg by mouth at bedtime. One po daily       . Ascorbic Acid (VITAMIN C) 500 MG tablet Take 500 mg by mouth daily. Take one po bid with iron tablet       . AVAPRO 150 MG tablet TAKE (1) TABLET BY MOUTH ONCE DAILY.  30 each  6  . Cholecalciferol (VITAMIN D) 1000 UNITS capsule Take 1,000 Units by mouth daily. One po qd       . Cyanocobalamin (VITAMIN B 12) 100 MCG LOZG Take by mouth.        . ferrous sulfate 324 (65 FE) MG TBEC Take by mouth. One po bid       . furosemide (LASIX) 40 MG tablet Take 1 tablet (40 mg total) by mouth daily. One po qd  30 tablet  6  . glucose blood test strip 1 each by Other route as needed. Use as instructed       . meclizine (ANTIVERT) 25 MG tablet TAKE 1 TABLET BY MOUTH EVERY 4 HOURS AS NEEDED FOR  DIZZINESS.  50 tablet  5  . metFORMIN (GLUCOPHAGE) 500 MG tablet TAKE 1  TABLET TWICE DAILY  60 tablet  3  .  metoprolol (LOPRESSOR) 50 MG tablet Take 1 tablet (50 mg total) by mouth 2 (two) times daily.  60 tablet  11  . PLAVIX 75 MG tablet TAKE 1 TABLET ONCE DAILY.  30 each  6  . VISTARIL 25 MG capsule TAKE (1) CAPSULE EVERY SIX HOURS AS NEEDED.  50 each  5  . ZOCOR 40 MG tablet TAKE ONE TABLET BY MOUTH AT BEDTIME.  30 each  11  . aspirin 81 MG tablet Two tablets  po once per day        Allergies:  Allergies  Allergen Reactions  . Codeine   . Diazepam     REACTION: causes pt unable to wake up after taking  . Lipitor (Atorvastatin Calcium)   . Penicillins   . Sulfonamide Derivatives     Social History:   reports that she has never smoked. She has never used smokeless tobacco. She reports that she drinks alcohol. She reports that she does not use illicit drugs.  Family History: Family History  Problem Relation Age of Onset  . Heart failure Father   . Leukemia Mother     Rewiew of Systems:  The patient denies anorexia, fever, weight loss,, vision loss, decreased hearing, hoarseness, syncope, peripheral edema, balance deficits, hemoptysis, abdominal pain, melena, hematochezia, severe indigestion/heartburn, hematuria, incontinence, genital sores, muscle weakness, suspicious skin lesions, transient blindness, difficulty walking, depression, unusual weight change, abnormal bleeding, enlarged lymph nodes, angioedema, and breast masses.  Physical Exam: Filed Vitals:   03/20/11 2119 03/20/11 2140 03/20/11 2153 03/20/11 2305  BP: 135/59 116/64 136/53 117/57  Pulse: 80 71 78 76  Temp:      TempSrc:      Resp: 18 14  16   Weight:      SpO2: 96% 100% 100% 100%   Blood pressure 117/57, pulse 76, temperature 97.4 F (36.3 C), temperature source Oral, resp. rate 16, weight 86.183 kg (190 lb), SpO2 100.00%.  GEN: Pleasant obese Caucasian lady lying in the stretcher in no acute distress; cooperative with exam PSYCH: He is alert and oriented x4; does not appear  anxious does not appear depressed; affect is normal; very often repeats herself HEENT: Mucous membranes pink and anicteric; PERRLA; EOM intact; no cervical lymphadenopathy nor thyromegaly  no JVD; Breasts:: Not examined CHEST WALL: No tenderness CHEST: Normal respiration, clear to auscultation bilaterally HEART: Irregularly irregular rate and rhythm; 2/6 high-pitched systolic murmur at the left lower sternal border BACK:  kyphosis ; no CVA tenderness ABDOMEN: Obese, soft non-tender; no masses, no organomegaly, normal abdominal bowel sounds; ; no intertriginous candida. Rectal Exam: Not done EXTREMITIES:  age-appropriate arthropathy of the hands and especially knees knees; trace bilateral edema; no ulcerations. Wearing compression stockings to knees. Genitalia: not examined PULSES: 2+ and symmetric SKIN: Normal hydration no rash or ulceration CNS: Cranial nerves 2-12 grossly intact no focal neurologic deficit   Labs & Imaging Results for orders placed during the hospital encounter of 03/20/11 (from the past 48 hour(s))  CBC     Status: Abnormal   Collection Time   03/20/11  9:10 PM      Component Value Range Comment   WBC 12.4 (*) 4.0 - 10.5 (K/uL)    RBC 3.80 (*) 3.87 - 5.11 (MIL/uL)    Hemoglobin 11.3 (*) 12.0 - 15.0 (g/dL)    HCT 45.4 (*) 09.8 - 46.0 (%)    MCV 87.1  78.0 - 100.0 (fL)    MCH 29.7  26.0 - 34.0 (pg)    MCHC  34.1  30.0 - 36.0 (g/dL)    RDW 91.4  78.2 - 95.6 (%)    Platelets 220  150 - 400 (K/uL)   BASIC METABOLIC PANEL     Status: Abnormal   Collection Time   03/20/11  9:10 PM      Component Value Range Comment   Sodium 136  135 - 145 (mEq/L)    Potassium 4.1  3.5 - 5.1 (mEq/L)    Chloride 94 (*) 96 - 112 (mEq/L)    CO2 28  19 - 32 (mEq/L)    Glucose, Bld 128 (*) 70 - 99 (mg/dL)    BUN 42 (*) 6 - 23 (mg/dL)    Creatinine, Ser 2.13 (*) 0.50 - 1.10 (mg/dL)    Calcium 9.8  8.4 - 10.5 (mg/dL)    GFR calc non Af Amer 35 (*) >90 (mL/min)    GFR calc Af Amer 40 (*)  >90 (mL/min)   CARDIAC PANEL(CRET KIN+CKTOT+MB+TROPI)     Status: Normal   Collection Time   03/20/11  9:10 PM      Component Value Range Comment   Total CK 68  7 - 177 (U/L)    CK, MB 3.3  0.3 - 4.0 (ng/mL)    Troponin I <0.30  <0.30 (ng/mL)    Relative Index RELATIVE INDEX IS INVALID  0.0 - 2.5    POCT I-STAT TROPONIN I     Status: Normal   Collection Time   03/20/11  9:15 PM      Component Value Range Comment   Troponin i, poc 0.04  0.00 - 0.08 (ng/mL)    Comment 3            GLUCOSE, CAPILLARY     Status: Abnormal   Collection Time   03/21/11  1:25 AM      Component Value Range Comment   Glucose-Capillary 130 (*) 70 - 99 (mg/dL)    Comment 1 Notify RN      Dg Chest Portable 1 View  03/20/2011  *RADIOLOGY REPORT*  Clinical Data: Chest pain  PORTABLE CHEST - 1 VIEW  Comparison: Chest radiograph 07/22/2010  Findings: Stable enlarged heart silhouette.  No effusion, infiltrate, or pneumothorax.  IMPRESSION: No cardiomegaly without acute cardiopulmonary process.  Original Report Authenticated By: Genevive Bi, M.D.      Assessment Present on Admission:  .Chest pain .ANEMIA .GERD .HYPERLIPIDEMIA .HYPERTENSION .Palpitations Atrial dysrhythmia Diabetes type 2 controlled on diet   PLAN: We'll bring this lady on observation to ensure she is not having an acute coronary syndrome, no chest pain is more likely related to demand ischemia during her episodes of  Arrhythmia. We'll get cardiology review of her rhythm for consideration of a pacemaker. PPI for her GERD symptoms  Other plans as per orders.   Shawnelle Spoerl 03/21/2011, 1:58 AM

## 2011-03-21 NOTE — Progress Notes (Signed)
Transferred to ICU-08 in stable condition on oxygen, reported to M. Colette Ribas, Charity fundraiser.

## 2011-03-21 NOTE — Progress Notes (Signed)
Pt is having left arm pain 8/10.  Morphine 2mg  IV given.  Also noted that pt is in a-fib on the bedside monitor.  Paged Dr. Sharon Seller to let him know of cardiac rhythm change and pt's new noted pain location.

## 2011-03-22 DIAGNOSIS — I959 Hypotension, unspecified: Secondary | ICD-10-CM | POA: Diagnosis present

## 2011-03-22 DIAGNOSIS — I214 Non-ST elevation (NSTEMI) myocardial infarction: Secondary | ICD-10-CM | POA: Diagnosis present

## 2011-03-22 LAB — BASIC METABOLIC PANEL
Calcium: 9.1 mg/dL (ref 8.4–10.5)
GFR calc Af Amer: 50 mL/min — ABNORMAL LOW (ref >90)
GFR calc non Af Amer: 43 mL/min — ABNORMAL LOW (ref >90)
Potassium: 4.5 mEq/L (ref 3.5–5.1)
Sodium: 135 mEq/L (ref 135–145)

## 2011-03-22 LAB — URINE CULTURE

## 2011-03-22 LAB — PROTIME-INR
INR: 1.2 (ref 0.00–1.49)
Prothrombin Time: 15.5 seconds — ABNORMAL HIGH (ref 11.6–15.2)

## 2011-03-22 LAB — CARDIAC PANEL(CRET KIN+CKTOT+MB+TROPI)
CK, MB: 19.7 ng/mL (ref 0.3–4.0)
Total CK: 264 U/L — ABNORMAL HIGH (ref 7–177)
Troponin I: 10.28 ng/mL (ref <0.30)

## 2011-03-22 LAB — CBC
MCV: 89.4 fL (ref 78.0–100.0)
Platelets: 166 10*3/uL (ref 150–400)
RBC: 3.4 MIL/uL — ABNORMAL LOW (ref 3.87–5.11)
RDW: 13.9 % (ref 11.5–15.5)
WBC: 10 10*3/uL (ref 4.0–10.5)

## 2011-03-22 LAB — GLUCOSE, CAPILLARY
Glucose-Capillary: 139 mg/dL — ABNORMAL HIGH (ref 70–99)
Glucose-Capillary: 130 mg/dL — ABNORMAL HIGH (ref 70–99)
Glucose-Capillary: 140 mg/dL — ABNORMAL HIGH (ref 70–99)

## 2011-03-22 MED ORDER — METOPROLOL TARTRATE 25 MG PO TABS
25.0000 mg | ORAL_TABLET | Freq: Two times a day (BID) | ORAL | Status: DC
  Administered 2011-03-22 (×2): 25 mg via ORAL
  Filled 2011-03-22 (×2): qty 1

## 2011-03-22 MED ORDER — SODIUM CHLORIDE 0.9 % IV SOLN
INTRAVENOUS | Status: DC
  Administered 2011-03-22 (×2): via INTRAVENOUS

## 2011-03-22 MED ORDER — SODIUM CHLORIDE 0.9 % IV SOLN: INTRAVENOUS | Status: DC

## 2011-03-22 MED ORDER — INFLUENZA VIRUS VACC SPLIT PF IM SUSP
0.5000 mL | Freq: Once | INTRAMUSCULAR | Status: AC
  Administered 2011-03-22: 0.5 mL via INTRAMUSCULAR
  Filled 2011-03-22: qty 0.5

## 2011-03-22 MED ORDER — OLMESARTAN MEDOXOMIL 5 MG PO TABS
10.0000 mg | ORAL_TABLET | Freq: Every day | ORAL | Status: DC
  Administered 2011-03-22 – 2011-03-23 (×2): 10 mg via ORAL
  Filled 2011-03-22: qty 2
  Filled 2011-03-22: qty 1

## 2011-03-22 NOTE — Progress Notes (Signed)
Critical called of MB 19.7 and troponin 10.28. Paged Dr. Orvan Falconer. No new orders received.

## 2011-03-22 NOTE — Progress Notes (Signed)
Subjective: The patient reports that she feels better today. She is no longer suffering with chest pain. She denies fevers chills nausea vomiting or headache. She does admit to feeling dizzy when she attempts to stand. She has not vomited nor has she moved her bowels since her admission. She denies shortness of breath.  Objective: Weight change: 0.416 kg (14.7 oz)  Intake/Output Summary (Last 24 hours) at 03/22/11 1610 Last data filed at 03/22/11 0600  Gross per 24 hour  Intake 1135.75 ml  Output   1050 ml  Net  85.75 ml   General: No acute respiratory distress Lungs: Clear to auscultation bilaterally but without wheezes or rhonchi Cardiovascular: Irregularly irregular rhythm with regular rate without gallop or rub or appreciable murmur - frequent ectopic beat Abdomen: Nontender nondistended soft bowel sounds present no organomegaly - overweight Extremities: No significant cyanosis clubbing or edema bilateral lower sugars Neuro: The patient is alert and oriented x4, cranial nerves II through XII are intact bilaterally, the neuro exam is nonfocal  Lab Results:  [facility] 03/22/11 0457 03/21/11 0658  NA 135 138  K 4.5 4.0  CL 98 99  CO2 27 28  GLUCOSE 172* 116*  BUN 31* 41*  CREATININE 1.13* 1.20*  CALCIUM 9.1 9.0  MG -- 1.3*  PHOS -- --    Basename 03/21/11 0658  AST 35  ALT 10  ALKPHOS 55  BILITOT 0.3  PROT 6.5  ALBUMIN 3.5   No results found for this basename: LIPASE:2,AMYLASE:2 in the last 72 hours  Basename 03/22/11 0457 03/21/11 0658  WBC 10.0 11.3*  NEUTROABS -- --  HGB 10.0* 10.2*  HCT 30.4* 30.2*  MCV 89.4 87.8  PLT 166 157    Basename 03/22/11 0458 03/21/11 1625 03/21/11 0658  CKTOTAL 264* 308* 236*  CKMB 19.7* 27.7* 16.7*  CKMBINDEX -- -- --  TROPONINI 10.28* 9.52* 6.24*   No results found for this basename: POCBNP:3 in the last 72 hours No results found for this basename: DDIMER:2 in the last 72 hours  Basename 03/21/11 0658  HGBA1C 6.3*   No  results found for this basename: CHOL:2,HDL:2,LDLCALC:2,TRIG:2,CHOLHDL:2,LDLDIRECT:2 in the last 72 hours  Basename 03/21/11 0658  TSH 4.640*  T4TOTAL --  T3FREE --  THYROIDAB --   No results found for this basename: VITAMINB12:2,FOLATE:2,FERRITIN:2,TIBC:2,IRON:2,RETICCTPCT:2 in the last 72 hours  Studies/Results: Dg Chest Portable 1 View  03/20/2011  *RADIOLOGY REPORT*  Clinical Data: Chest pain  PORTABLE CHEST - 1 VIEW  Comparison: Chest radiograph 07/22/2010  Findings: Stable enlarged heart silhouette.  No effusion, infiltrate, or pneumothorax.  IMPRESSION: No cardiomegaly without acute cardiopulmonary process.  Original Report Authenticated By: Genevive Bi, M.D.   Medications: Scheduled Meds:    . aspirin  325 mg Oral Daily  . clopidogrel  75 mg Oral Q breakfast  . enoxaparin  1 mg/kg Subcutaneous Q12H  . insulin aspart  0-5 Units Subcutaneous QHS  . insulin aspart  0-9 Units Subcutaneous TID WC  . metoprolol tartrate  50 mg Oral BID  .  morphine injection  1 mg Intravenous Once  . morphine  2 mg Intravenous Once  . morphine      . olmesartan  20 mg Oral Daily  . pantoprazole  40 mg Oral Q1200  . pneumococcal 23 valent vaccine  0.5 mL Intramuscular Tomorrow-1000  . simvastatin  40 mg Oral q1800  . sodium chloride      . DISCONTD: enoxaparin  30 mg Subcutaneous Daily   Continuous Infusions:    .  0.9 % NaCl with KCl 20 mEq / L 20 mL/hr at 03/22/11 0600  . nitroGLYCERIN 5 mcg/min (03/22/11 0600)   PRN Meds:.acetaminophen, acetaminophen, bisacodyl, docusate sodium, gi cocktail, morphine injection, nitroGLYCERIN, ondansetron (ZOFRAN) IV, ondansetron, senna-docusate, simethicone, sodium phosphate, traZODone  Assessment/Plan:  acute myocardial infarction/NSTEMI The patient is doing well at this point. This morning she is entirely asymptomatic. We will continue medical therapy for the patient's acute myocardial infarction. We will request an echocardiogram to assess the  patient's left ventricular systolic function. We will continue full dose Lovenox for 72 hours total and then transition to DVT prophylaxis only. We will continue nitroglycerin drip for another 24 hours. We will then attempt to transition her to oral nitrates. She remains on a beta blocker as well as an ARB. Her EKG is without any new findings this morning. We will request an inpatient cardiology consult on Monday when it is available here at [facility].   HYPERLIPIDEMIA  continue her home medication regimen  ANEMIA  This patient's anemia appears to be a chronic issue likely related to her chronic inflammatory disease. Her hemoglobin remained stable at the present time.  HYPERTENSION The patient's blood pressure is somewhat on the low side at the present time. This is likely a component of dehydration as well as the patient's acute MI. Medication treatment for her MI is also complicating this issue. We will hydrate her. We will minimize her blood pressure affecting medications as possible.  GERD We will continue the patient's home proton pump inhibitor regimen.  Palpitations The patient's palpitations have been extensively evaluated by her Cardiologist at [facility].  At the present time we will continue her outpatient medical regimen and follow her on telemetry.  UTI (lower urinary tract infection) We will continue to administer empiric antibiotics and a followup urine culture when it is available.   Sarcoidosis At the present time this appears to be well controlled/compensated.   Diet controlled diabetes mellitus  We will continue to follow the patient's CBG trend during her hospital stay.     LOS: 2 days   MCCLUNG,JEFFREY T 03/22/2011, 8:23 AM

## 2011-03-23 ENCOUNTER — Inpatient Hospital Stay (HOSPITAL_COMMUNITY): Payer: Medicare Other

## 2011-03-23 ENCOUNTER — Telehealth: Payer: Self-pay | Admitting: Pulmonary Disease

## 2011-03-23 ENCOUNTER — Other Ambulatory Visit: Payer: Self-pay

## 2011-03-23 ENCOUNTER — Inpatient Hospital Stay (HOSPITAL_COMMUNITY)
Admission: AD | Admit: 2011-03-23 | Discharge: 2011-04-01 | DRG: 246 | Disposition: A | Discharge status: Home Health | Payer: Medicare Other | Source: Other Acute Inpatient Hospital | Attending: Cardiology | Admitting: Cardiology

## 2011-03-23 DIAGNOSIS — I2510 Atherosclerotic heart disease of native coronary artery without angina pectoris: Secondary | ICD-10-CM

## 2011-03-23 DIAGNOSIS — I359 Nonrheumatic aortic valve disorder, unspecified: Secondary | ICD-10-CM

## 2011-03-23 LAB — CBC
HCT: 26.9 % — ABNORMAL LOW (ref 36.0–46.0)
MCH: 29.3 pg (ref 26.0–34.0)
MCV: 89.7 fL (ref 78.0–100.0)
RBC: 3 MIL/uL — ABNORMAL LOW (ref 3.87–5.11)
WBC: 8 10*3/uL (ref 4.0–10.5)

## 2011-03-23 LAB — BASIC METABOLIC PANEL
BUN: 29 mg/dL — ABNORMAL HIGH (ref 6–23)
Chloride: 100 mEq/L (ref 96–112)
Creatinine, Ser: 1.2 mg/dL — ABNORMAL HIGH (ref 0.50–1.10)
GFR calc Af Amer: 46 mL/min — ABNORMAL LOW (ref >90)
GFR calc non Af Amer: 40 mL/min — ABNORMAL LOW (ref >90)
Potassium: 4.2 mEq/L (ref 3.5–5.1)

## 2011-03-23 LAB — CARDIAC PANEL(CRET KIN+CKTOT+MB+TROPI)
Relative Index: INVALID (ref 0.0–2.5)
Total CK: 84 U/L (ref 7–177)

## 2011-03-23 LAB — GLUCOSE, CAPILLARY

## 2011-03-23 LAB — POCT ACTIVATED CLOTTING TIME
Activated Clotting Time: 220 seconds
Activated Clotting Time: 127 seconds

## 2011-03-23 MED ORDER — NITROGLYCERIN IN D5W 200-5 MCG/ML-% IV SOLN: 2.0000 ug/min | INTRAVENOUS | Status: DC

## 2011-03-23 MED ORDER — METOPROLOL TARTRATE 25 MG PO TABS
12.5000 mg | ORAL_TABLET | Freq: Two times a day (BID) | ORAL | Status: DC
  Filled 2011-03-23: qty 1

## 2011-03-23 MED ORDER — SODIUM CHLORIDE 0.9 % IV SOLN: INTRAVENOUS | Status: DC

## 2011-03-23 MED ORDER — LORAZEPAM 0.5 MG PO TABS
0.5000 mg | ORAL_TABLET | ORAL | Status: AC
  Administered 2011-03-23: 0.5 mg via ORAL
  Filled 2011-03-23: qty 1

## 2011-03-23 MED ORDER — METOPROLOL TARTRATE 25 MG PO TABS: 25.0000 mg | ORAL_TABLET | Freq: Three times a day (TID) | ORAL | Status: DC

## 2011-03-23 MED ORDER — FUROSEMIDE 10 MG/ML IJ SOLN
40.0000 mg | Freq: Once | INTRAMUSCULAR | Status: AC
  Administered 2011-03-23: 40 mg via INTRAVENOUS
  Filled 2011-03-23: qty 4

## 2011-03-23 MED ORDER — METOPROLOL TARTRATE 1 MG/ML IV SOLN: 5.0000 mg | INTRAVENOUS | Status: DC | PRN

## 2011-03-23 MED ORDER — METOPROLOL TARTRATE 1 MG/ML IV SOLN
5.0000 mg | INTRAVENOUS | Status: AC
  Administered 2011-03-23 (×3): 5 mg via INTRAVENOUS
  Filled 2011-03-23: qty 15

## 2011-03-23 NOTE — Progress Notes (Signed)
Report called to [facility], RN on 2900. Pt transported via carelink to 2900. pts belongings sent with her. Pt alert, oriented and in stable condition at the time of transport. Pt's daughter in-law and Dwana Curd were notified that pt has been transported.

## 2011-03-23 NOTE — Telephone Encounter (Signed)
I spoke with pt daughter and she states pt has been in [facility] since Friday. Pt has been having chest pains and just hasn't been feeling well. A cardiologist is going in and see her today. Pt is in ICU room 8. Daughter is not sure if she had a heart attack or not. She states they are unsure. Will forward to Dr. Kriste Basque as an Lorain Childes. Please advise, thanks  Carver Fila, CMA

## 2011-03-23 NOTE — Consults (Signed)
HPI: Alexandra [name] is an 77 y.o. femalereferred for consultation by Pleas Koch for acute myocardial infarction.  This nice woman has previously been followed by Dr. Antoine Poche for palpitations, apparently without documented arrhythmia.  She previously had complained of throat discomfort associated with anxiety, which was treated symptomatically.  Otherwise, she has no significant cardiac history.  She presented with moderately severe central chest discomfort, characterized as pressure, and radiating to the neck, jaw and shoulders.  This was associated with dyspnea, diaphoresis, lightheadedness and palpitations.  Cardiac markers revealed evidence for acute myocardial infarction with a peak troponin so far of 9.5.  She has multiple cardiovascular risk factors including diabetes, hypertension and hyperlipidemia.  She is said to have peripheral vascular disease, but the evidence for this is uncertain.  She has continued to have intermittent throat discomfort in hospital but has not responded significantly to sublingual nitroglycerin although that treatment was beneficial in the emergency department.  Patient reports that she has no history of asthma or lung disease other than sarcoidosis, which is in remission; however, she is followed by Dr. Kriste Basque and said to have a history of asthmatic bronchitis.  Past Medical History  Diagnosis Date  . Asthmatic bronchitis   . Sarcoidosis   . HTN (hypertension)   . PVD (peripheral vascular disease)   . Venous insufficiency   . Hyperlipemia   . DM (diabetes mellitus)   . Morbid obesity   . GERD (gastroesophageal reflux disease)   . Diverticulosis of colon   . IBS (irritable bowel syndrome)   . DJD (degenerative joint disease)   . Vitamin D deficiency disease   . Fibromyalgia   . Anxiety   . Intermittent vertigo   . Anemia   . Diverticulosis of colon   . Knee pain   . Hip pain   . Anserine bursitis   . Bursitis of hip   . Palpitations   . Patellar  subluxation     malaignment     Past Surgical History  Procedure Date  . Cholecystectomy   . Tonsillectomy   . Hernia repair   . Cataract extraction     Family History  Problem Relation Age of Onset  . Heart failure Father   . Leukemia Mother     Social History:  reports that she has never smoked. She has never used smokeless tobacco. She reports that she drinks alcohol. She reports that she does not use illicit drugs.  Allergies:  Allergies  Allergen Reactions  . Codeine   . Diazepam     REACTION: causes pt unable to wake up after taking  . Lipitor (Atorvastatin Calcium)   . Penicillins   . Sulfonamide Derivatives     Results for orders placed during the hospital encounter of 03/20/11 (from the past 48 hour(s))  MRSA PCR SCREENING     Status: Normal   Collection Time   03/21/11 12:58 PM      Component Value Range Comment   MRSA by PCR NEGATIVE  NEGATIVE    CARDIAC PANEL(CRET KIN+CKTOT+MB+TROPI)     Status: Abnormal   Collection Time   03/21/11  4:25 PM      Component Value Range Comment   Total CK 308 (*) 7 - 177 (U/L)    CK, MB 27.7 (*) 0.3 - 4.0 (ng/mL) CRITICAL VALUE NOTED.  VALUE IS CONSISTENT WITH PREVIOUSLY REPORTED AND CALLED VALUE.   Troponin I 9.52 (*) <0.30 (ng/mL)    Relative Index 9.0 (*) 0.0 - 2.5  GLUCOSE, CAPILLARY     Status: Abnormal   Collection Time   03/21/11  4:54 PM      Component Value Range Comment   Glucose-Capillary 129 (*) 70 - 99 (mg/dL)    Comment 1 Notify RN      Comment 2 Documented in Chart     GLUCOSE, CAPILLARY     Status: Abnormal   Collection Time   03/21/11  9:52 PM      Component Value Range Comment   Glucose-Capillary 112 (*) 70 - 99 (mg/dL)   CBC     Status: Abnormal   Collection Time   03/22/11  4:57 AM      Component Value Range Comment   WBC 10.0  4.0 - 10.5 (K/uL)    RBC 3.40 (*) 3.87 - 5.11 (MIL/uL)    Hemoglobin 10.0 (*) 12.0 - 15.0 (g/dL)    HCT 45.4 (*) 09.8 - 46.0 (%)    MCV 89.4  78.0 - 100.0 (fL)      MCH 29.4  26.0 - 34.0 (pg)    MCHC 32.9  30.0 - 36.0 (g/dL)    RDW 11.9  14.7 - 82.9 (%)    Platelets 166  150 - 400 (K/uL)   PROTIME-INR     Status: Abnormal   Collection Time   03/22/11  4:57 AM      Component Value Range Comment   Prothrombin Time 15.5 (*) 11.6 - 15.2 (seconds)    INR 1.20  0.00 - 1.49    BASIC METABOLIC PANEL     Status: Abnormal   Collection Time   03/22/11  4:57 AM      Component Value Range Comment   Sodium 135  135 - 145 (mEq/L)    Potassium 4.5  3.5 - 5.1 (mEq/L)    Chloride 98  96 - 112 (mEq/L)    CO2 27  19 - 32 (mEq/L)    Glucose, Bld 172 (*) 70 - 99 (mg/dL)    BUN 31 (*) 6 - 23 (mg/dL)    Creatinine, Ser 5.62 (*) 0.50 - 1.10 (mg/dL)    Calcium 9.1  8.4 - 10.5 (mg/dL)    GFR calc non Af Amer 43 (*) >90 (mL/min)    GFR calc Af Amer 50 (*) >90 (mL/min)   CARDIAC PANEL(CRET KIN+CKTOT+MB+TROPI)     Status: Abnormal   Collection Time   03/22/11  4:58 AM      Component Value Range Comment   Total CK 264 (*) 7 - 177 (U/L)    CK, MB 19.7 (*) 0.3 - 4.0 (ng/mL)    Troponin I 10.28 (*) <0.30 (ng/mL)    Relative Index 7.5 (*) 0.0 - 2.5    GLUCOSE, CAPILLARY     Status: Abnormal   Collection Time   03/22/11  8:07 AM      Component Value Range Comment   Glucose-Capillary 139 (*) 70 - 99 (mg/dL)    Comment 1 Notify RN      Comment 2 Documented in Chart     GLUCOSE, CAPILLARY     Status: Abnormal   Collection Time   03/22/11 11:57 AM      Component Value Range Comment   Glucose-Capillary 130 (*) 70 - 99 (mg/dL)    Comment 1 Documented in Chart      Comment 2 Notify RN     GLUCOSE, CAPILLARY     Status: Abnormal   Collection Time   03/22/11  4:42 PM  Component Value Range Comment   Glucose-Capillary 140 (*) 70 - 99 (mg/dL)    Comment 1 Notify RN      Comment 2 Documented in Chart     GLUCOSE, CAPILLARY     Status: Abnormal   Collection Time   03/22/11  9:47 PM      Component Value Range Comment   Glucose-Capillary 142 (*) 70 - 99 (mg/dL)     Comment 1 Notify RN     BASIC METABOLIC PANEL     Status: Abnormal   Collection Time   03/23/11  5:00 AM      Component Value Range Comment   Sodium 133 (*) 135 - 145 (mEq/L)    Potassium 4.2  3.5 - 5.1 (mEq/L)    Chloride 100  96 - 112 (mEq/L)    CO2 27  19 - 32 (mEq/L)    Glucose, Bld 143 (*) 70 - 99 (mg/dL)    BUN 29 (*) 6 - 23 (mg/dL)    Creatinine, Ser 4.09 (*) 0.50 - 1.10 (mg/dL)    Calcium 8.6  8.4 - 10.5 (mg/dL)    GFR calc non Af Amer 40 (*) >90 (mL/min)    GFR calc Af Amer 46 (*) >90 (mL/min)   CARDIAC PANEL(CRET KIN+CKTOT+MB+TROPI)     Status: Abnormal   Collection Time   03/23/11  5:00 AM      Component Value Range Comment   Total CK 84  7 - 177 (U/L)    CK, MB 5.8 (*) 0.3 - 4.0 (ng/mL)    Troponin I 3.98 (*) <0.30 (ng/mL)    Relative Index RELATIVE INDEX IS INVALID  0.0 - 2.5    CBC     Status: Abnormal   Collection Time   03/23/11  5:00 AM      Component Value Range Comment   WBC 8.0  4.0 - 10.5 (K/uL)    RBC 3.00 (*) 3.87 - 5.11 (MIL/uL)    Hemoglobin 8.8 (*) 12.0 - 15.0 (g/dL)    HCT 81.1 (*) 91.4 - 46.0 (%)    MCV 89.7  78.0 - 100.0 (fL)    MCH 29.3  26.0 - 34.0 (pg)    MCHC 32.7  30.0 - 36.0 (g/dL)    RDW 78.2  95.6 - 21.3 (%)    Platelets 135 (*) 150 - 400 (K/uL)   GLUCOSE, CAPILLARY     Status: Abnormal   Collection Time   03/23/11  7:40 AM      Component Value Range Comment   Glucose-Capillary 119 (*) 70 - 99 (mg/dL)     Dg Chest Portable 1 View  03/23/2011  *RADIOLOGY REPORT*  Clinical Data: Acute MI, chest pain  PORTABLE CHEST - 1 VIEW  Comparison: 03/20/2011  Findings: Chronic interstitial markings.  Suspected mild superimposed interstitial edema.  Bibasilar atelectasis.  No pleural effusion or pneumothorax.  Stable cardiomegaly.  IMPRESSION: Stable cardiomegaly.  Suspected mild interstitial edema.  Original Report Authenticated By: Charline Bills, M.D.    General: no anorexia, weight gain or weight loss Cardiac: no chest pain, dyspnea,  orthopnea, PND,  or syncope Respiratory: no cough, sputum production or hemoptysis GI: no nausea, abdominal pain, emesis, diarrhea or constipation Integument: no significant lesions Neurologic: No muscle weakness or paralysis; no speech disturbance; no headache All other systems reviewed and are negative.  Blood pressure 157/70, pulse 100, temperature 98 F (36.7 C), temperature source Oral, resp. rate 24, height 5\' 5"  (1.651 m), weight 88.8 kg (195 lb  12.3 oz), SpO2 95.00%. Body mass index is 32.58 kg/(m^2).  General-Well-developed; Obese; no acute distress HEENT-[place]/AT; PERRL; EOM intact; conjunctiva and lids nl Neck-No JVD; no carotid bruits Endocrine-No thyromegaly Lungs-decreased breath sounds at the bases with few rales; resonant percussion; normal I-to-E ratio; mild expiratory rhonchi Cardiovascular- normal PMI; normal S1 and S2; prominent S4; grade 2/6 holosystolic murmur at the left sternal border Abdomen-BS normal; soft and non-tender without masses or organomegaly Musculoskeletal-No deformities, cyanosis or clubbing Neurologic-Nl cranial nerves; symmetric strength and tone Skin- Warm, no significant lesions Extremities-Nl distal pulses; no edema  EKG:  The only tracing available is from 10/19.  Sinus rhythm was present with blocked PACs and sinus arrhythmia.  ST Segment coving was present with peaked T waves.  Echocardiogram:  Anteroapical wall motion abnormality with preserved overall LV systolic function; mild aortic stenosis; mild mitral regurgitation and tricuspid regurgitation.   Assessment/Plan: Acute myocardial infarction: Repeat EKG is pending.  Patient has apparently suffered a non--Q and non-ST segment elevation MI.  On exam, she is tachycardic with probable mild congestive heart failure and continuing symptoms that possibly reflect myocardial ischemia.  Initially, she was thought not to be a candidate for cardiac catheterization due to DNR status.  I have clarified  that with her-she wishes not to be intubated unless relatively rapid recovery to a good quality of life is anticipated.  I've discontinued her DNR status.  She is eager to undergo cardiac catheterization and to receive the best therapy for her acute MI.  She will be transferred to [facility] in order to accomplish this.  In the interim, chest discomfort will be treated with increased IV nitroglycerin and beta blocker as tolerated.  Treatment with Plavix, which clearly was initiated due to the presence of cerebrovascular disease, has been continued.  She will be hydrated in anticipation of her catheterization, but furosemide will be given.  Ativan will also be given orally for anxiety.   Anemia - no history of same.  This will require further evaluation.  Iron studies ordered as well as stool for Hemoccult testing.  Hypertension blood pressure elevation is only modest at present.  Antihypertensive medication will be adjusted as necessary.  Hyperlipidemia-history of "allergy" to atorvastatin.  Appropriate therapy will be established prior to hospital discharge.  Transfer by ambulance today and coronary angiography today have been requested.  [place] Bing 03/23/2011, 11:45 AM

## 2011-03-23 NOTE — Progress Notes (Signed)
Subjective: Well.  Still feesl palpitations and occasional dizzy when she stands from laying in bed.  No sob, mild discomfort in chest, but denies cp.  States that she feeels attacks of "beating in her [place]" which then radiates to the mid throat area and then she feels the "beating in her chest"-some mild SOB.  No N/V/Blurred vision, double vision-didn't want to eat breakfast this am States she stated that she called Dr. Asencion Islam (who has seen her for 22 years for ?Lung disease vs Sarcoid) to inform him of her being here    Objective:    Filed Vitals:   03/23/11 0730 03/23/11 0745 03/23/11 0800 03/23/11 0815  BP: 93/49 67/47 92/40  132/65  Pulse: 67 87 73 79  Temp:      TempSrc:      Resp: 16 20 15 20   Height:      Weight:      SpO2: 100% 96% 99% 99%    Weight change: 2.2 kg (4 lb 13.6 oz)  Intake/Output Summary (Last 24 hours) at 03/23/11 1191 Last data filed at 03/23/11 0600  Gross per 24 hour  Intake 2065.5 ml  Output    700 ml  Net 1365.5 ml   Alert CF, NAD.  Mild pallor, no icterus CTA B-no TVR, No TVF S1 S2 RUSE M 3/6---> to Carotid?  NO bruit heard on either side Abd soft nt,nd Skin-Seborrhoac changes Grade 1 LE pitt Edema  Lab Results: CBC    Component Value Date/Time   WBC 8.0 03/23/2011 0500   RBC 3.00* 03/23/2011 0500   HGB 8.8* 03/23/2011 0500   HCT 26.9* 03/23/2011 0500   PLT 135* 03/23/2011 0500   MCV 89.7 03/23/2011 0500   MCH 29.3 03/23/2011 0500   MCHC 32.7 03/23/2011 0500   RDW 13.9 03/23/2011 0500   LYMPHSABS 1.0 07/22/2010 1220   MONOABS 0.3 07/22/2010 1220   EOSABS 0.3 07/22/2010 1220   BASOSABS 0.0 07/22/2010 1220    BMET    Component Value Date/Time   NA 133* 03/23/2011 0500   K 4.2 03/23/2011 0500   CL 100 03/23/2011 0500   CO2 27 03/23/2011 0500   GLUCOSE 143* 03/23/2011 0500   BUN 29* 03/23/2011 0500   CREATININE 1.20* 03/23/2011 0500   CALCIUM 8.6 03/23/2011 0500   GFRNONAA 40* 03/23/2011 0500   GFRAA 46* 03/23/2011 0500       Micro Results: Recent Results (from the past 240 hour(s))  URINE CULTURE     Status: Normal   Collection Time   03/21/11  2:20 AM      Component Value Range Status Comment   Specimen Description URINE, RANDOM   Final    Special Requests NONE   Final    Setup Time [id number]   Final    Colony Count 1,000 COLONIES/ML   Final    Culture INSIGNIFICANT GROWTH   Final    Report Status 03/22/2011 FINAL   Final   MRSA PCR SCREENING     Status: Normal   Collection Time   03/21/11 12:58 PM      Component Value Range Status Comment   MRSA by PCR NEGATIVE  NEGATIVE  Final     Studies/Results: Dg Chest Portable 1 View  03/20/2011  *RADIOLOGY REPORT*  Clinical Data: Chest pain  PORTABLE CHEST - 1 VIEW  Comparison: Chest radiograph 07/22/2010  Findings: Stable enlarged heart silhouette.  No effusion, infiltrate, or pneumothorax.  IMPRESSION: No cardiomegaly without acute cardiopulmonary process.  Original Report  Authenticated By: Genevive Bi, M.D.   Medications: Scheduled Meds:   . aspirin  325 mg Oral Daily  . clopidogrel  75 mg Oral Q breakfast  . enoxaparin  1 mg/kg Subcutaneous Q12H  . influenza  inactive virus vaccine  0.5 mL Intramuscular Once  . insulin aspart  0-5 Units Subcutaneous QHS  . insulin aspart  0-9 Units Subcutaneous TID WC  . metoprolol tartrate  25 mg Oral BID  . olmesartan  10 mg Oral Daily  . pantoprazole  40 mg Oral Q1200  . pneumococcal 23 valent vaccine  0.5 mL Intramuscular Tomorrow-1000  . simvastatin  40 mg Oral q1800   Continuous Infusions:   . sodium chloride 75 mL/hr at 03/23/11 0600  . nitroGLYCERIN 5 mcg/min (03/23/11 0600)   PRN Meds:.acetaminophen, acetaminophen, bisacodyl, docusate sodium, gi cocktail, morphine injection, nitroGLYCERIN, ondansetron (ZOFRAN) IV, ondansetron, senna-docusate, simethicone, sodium phosphate, traZODone  Assessment/Plan:   acute myocardial infarction/NSTEMI  The patient is doing well at this point-  continue medical therapy for the patient's acute myocardial infarction.  Echocardiogram ordered to assess the patient's left ventricular systolic function. continue full dose Lovenox for 72 hours total and then transition to DVT prophylaxis only D/c nitroglycerin drip given mild hypotension and possible orthotasis. We will then attempt to transition her to oral nitrates. She remains on a beta blocker as well as an ARB.    inpatient cardiology consulted this  Am  HYPERLIPIDEMIA continue her home medication regimen   ANEMIA  This patient's anemia appears to be a chronic issue likely related to her chronic inflammatory disease Hb dropped-will gauic all stools-if +, would consider d/c lovenox and calling GI re her possible GIB-is on ASa/Plavix and full dose Lovenox.   HYPERTENSION  The patient's blood pressure is somewhat on the low side at the present time. ? dehydration vs. acute MI. Medication treatment for her MI is also complicating this issue. We will hydrate her cautiosly-unclear if her BP will sustain-Might need invasive monitorign per cardiology if persists-Consult called for cards-will review orthostatics  GERD  We will continue the patient's home proton pump inhibitor regimen.   Palpitations  The patient's palpitations have been extensively evaluated by her Cardiologist at [facility]. At the present time we will continue her outpatient medical regimen and follow her on telemetry.   UTI (lower urinary tract infection)  D/C empiric antibiotics and a followup urine culture when it is available.   Sarcoidosis  At the present time this appears to be well controlled/compensated--Will forward this note to [employer].   Diet controlled diabetes mellitus  We will continue to follow the patient's CBG trend during her hospital stay.    Will discuss with Son Bj and his Wife Dois Davenport POC    LOS: 3 days   [facility] 03/23/2011, 9:22 AM

## 2011-03-23 NOTE — Progress Notes (Signed)
*  PRELIMINARY RESULTS* Echocardiogram 2D Echocardiogram has been performed.  Conrad Brownlee 03/23/2011, 9:51 AM

## 2011-03-23 NOTE — Telephone Encounter (Signed)
SN is aware of pt in the hospital at [facility]

## 2011-03-24 ENCOUNTER — Ambulatory Visit: Payer: Medicare Other | Admitting: Pulmonary Disease

## 2011-03-24 ENCOUNTER — Telehealth: Payer: Self-pay | Admitting: Pulmonary Disease

## 2011-03-24 LAB — CBC
Platelets: 100 10*3/uL — ABNORMAL LOW (ref 150–400)
RBC: 2.91 MIL/uL — ABNORMAL LOW (ref 3.87–5.11)
WBC: 6.2 10*3/uL (ref 4.0–10.5)

## 2011-03-24 LAB — BASIC METABOLIC PANEL
CO2: 24 mEq/L (ref 19–32)
Chloride: 107 mEq/L (ref 96–112)
Potassium: 3.7 mEq/L (ref 3.5–5.1)
Sodium: 140 mEq/L (ref 135–145)

## 2011-03-24 LAB — GLUCOSE, CAPILLARY
Glucose-Capillary: 129 mg/dL — ABNORMAL HIGH (ref 70–99)
Glucose-Capillary: 110 mg/dL — ABNORMAL HIGH (ref 70–99)
Glucose-Capillary: 140 mg/dL — ABNORMAL HIGH (ref 70–99)
Glucose-Capillary: 114 mg/dL — ABNORMAL HIGH (ref 70–99)

## 2011-03-24 NOTE — Telephone Encounter (Signed)
SN is aware that pt is at [facility].

## 2011-03-24 NOTE — Telephone Encounter (Signed)
Will forward to SN as an [place]. Please advise, thanks  Carver Fila, CMA

## 2011-03-25 ENCOUNTER — Inpatient Hospital Stay (HOSPITAL_COMMUNITY): Payer: Medicare Other

## 2011-03-25 DIAGNOSIS — I200 Unstable angina: Secondary | ICD-10-CM

## 2011-03-25 LAB — BASIC METABOLIC PANEL
BUN: 18 mg/dL (ref 6–23)
CO2: 26 mEq/L (ref 19–32)
GFR calc non Af Amer: 47 mL/min — ABNORMAL LOW (ref >90)
Glucose, Bld: 139 mg/dL — ABNORMAL HIGH (ref 70–99)
Potassium: 4.2 mEq/L (ref 3.5–5.1)

## 2011-03-25 LAB — GLUCOSE, CAPILLARY
Glucose-Capillary: 144 mg/dL — ABNORMAL HIGH (ref 70–99)
Glucose-Capillary: 150 mg/dL — ABNORMAL HIGH (ref 70–99)

## 2011-03-25 LAB — CBC
HCT: 25.5 % — ABNORMAL LOW (ref 36.0–46.0)
Hemoglobin: 8.3 g/dL — ABNORMAL LOW (ref 12.0–15.0)
MCH: 28.8 pg (ref 26.0–34.0)
MCHC: 32.5 g/dL (ref 30.0–36.0)
RBC: 2.88 MIL/uL — ABNORMAL LOW (ref 3.87–5.11)

## 2011-03-26 DIAGNOSIS — I10 Essential (primary) hypertension: Secondary | ICD-10-CM

## 2011-03-26 LAB — GLUCOSE, CAPILLARY
Glucose-Capillary: 121 mg/dL — ABNORMAL HIGH (ref 70–99)
Glucose-Capillary: 128 mg/dL — ABNORMAL HIGH (ref 70–99)
Glucose-Capillary: 178 mg/dL — ABNORMAL HIGH (ref 70–99)
Glucose-Capillary: 141 mg/dL — ABNORMAL HIGH (ref 70–99)

## 2011-03-26 LAB — BASIC METABOLIC PANEL
BUN: 14 mg/dL (ref 6–23)
Chloride: 107 mEq/L (ref 96–112)
GFR calc Af Amer: 60 mL/min — ABNORMAL LOW (ref >90)
GFR calc non Af Amer: 52 mL/min — ABNORMAL LOW (ref >90)
Potassium: 4.2 mEq/L (ref 3.5–5.1)
Sodium: 141 mEq/L (ref 135–145)

## 2011-03-26 LAB — HEPARIN INDUCED THROMBOCYTOPENIA PNL
Heparin Induced Plt Ab: NEGATIVE
UFH Low Dose 0.1 IU/mL: 0 % Release

## 2011-03-26 LAB — PRO B NATRIURETIC PEPTIDE: Pro B Natriuretic peptide (BNP): 8679 pg/mL — ABNORMAL HIGH (ref 0–450)

## 2011-03-27 LAB — GLUCOSE, CAPILLARY
Glucose-Capillary: 142 mg/dL — ABNORMAL HIGH (ref 70–99)
Glucose-Capillary: 133 mg/dL — ABNORMAL HIGH (ref 70–99)

## 2011-03-27 LAB — BASIC METABOLIC PANEL
Chloride: 103 mEq/L (ref 96–112)
GFR calc Af Amer: 68 mL/min — ABNORMAL LOW (ref >90)
GFR calc non Af Amer: 59 mL/min — ABNORMAL LOW (ref >90)
Potassium: 4.1 mEq/L (ref 3.5–5.1)
Sodium: 141 mEq/L (ref 135–145)

## 2011-03-28 DIAGNOSIS — I214 Non-ST elevation (NSTEMI) myocardial infarction: Secondary | ICD-10-CM

## 2011-03-28 LAB — GLUCOSE, CAPILLARY
Glucose-Capillary: 193 mg/dL — ABNORMAL HIGH (ref 70–99)
Glucose-Capillary: 119 mg/dL — ABNORMAL HIGH (ref 70–99)

## 2011-03-28 LAB — BASIC METABOLIC PANEL
BUN: 10 mg/dL (ref 6–23)
CO2: 32 mEq/L (ref 19–32)
Calcium: 9.4 mg/dL (ref 8.4–10.5)
GFR calc non Af Amer: 63 mL/min — ABNORMAL LOW (ref >90)
Glucose, Bld: 131 mg/dL — ABNORMAL HIGH (ref 70–99)
Sodium: 141 mEq/L (ref 135–145)

## 2011-03-29 LAB — CBC
Hemoglobin: 8.6 g/dL — ABNORMAL LOW (ref 12.0–15.0)
MCH: 29 pg (ref 26.0–34.0)
MCV: 88.2 fL (ref 78.0–100.0)
Platelets: 184 10*3/uL (ref 150–400)
RBC: 2.97 MIL/uL — ABNORMAL LOW (ref 3.87–5.11)
WBC: 8.4 10*3/uL (ref 4.0–10.5)

## 2011-03-29 LAB — BASIC METABOLIC PANEL
BUN: 10 mg/dL (ref 6–23)
CO2: 34 mEq/L — ABNORMAL HIGH (ref 19–32)
Chloride: 97 mEq/L (ref 96–112)
Glucose, Bld: 125 mg/dL — ABNORMAL HIGH (ref 70–99)
Sodium: 139 mEq/L (ref 135–145)

## 2011-03-29 LAB — GLUCOSE, CAPILLARY
Glucose-Capillary: 120 mg/dL — ABNORMAL HIGH (ref 70–99)
Glucose-Capillary: 130 mg/dL — ABNORMAL HIGH (ref 70–99)

## 2011-03-29 NOTE — Cardiovascular Report (Signed)
[name], [name]                ACCOUNT NO.:  [id number]  MEDICAL RECORD NO.:  [id number]  LOCATION:  2904                         FACILITY:  [facility]  PHYSICIAN:  Veverly Fells. Excell Seltzer, MD  DATE OF BIRTH:  29-Apr-1924  DATE OF PROCEDURE: DATE OF DISCHARGE:                           CARDIAC CATHETERIZATION   PROCEDURE: 1. Left heart catheterization. 2. Selective coronary angiography. 3. Percutaneous transluminal coronary angioplasty and stenting of the     left anterior descending. 4. Perclose of the right femoral artery.  OPERATOR:  Diagnostic procedure, Dr. Eden Emms, interventional procedure Dr. Excell Seltzer.  PROCEDURAL INDICATION:  Alexandra [name] is an 77 year old woman who presented with non ST elevation infarction.  She has had severe chest pain intermittently over the last several days.  She transferred to [facility] from [facility] for cardiac catheterization.  The patient has been maintained on long-term aspirin and Plavix, and she received IV heparin prior to her cardiac cath.  DIAGNOSTIC FINDINGS:  Aortic pressure 105/49 with a mean of 72.  Left ventricular pressure 112/16.  Left ventriculography was deferred because of total contrast volume used and the patient had reduced creatinine clearance.  Left mainstem:  The left main is patent and heavily calcified and there is a 40% mid left main lesion, this appears nonobstructive with a patent lumen.  The left main divides into the LAD and left circumflex.  LAD:  The LAD has very heavy calcification.  There is a critical 99% proximal LAD lesion with associated heavy calcification.  There is TIMI 1 in the LAD and the diagonal branches beyond the severe LAD stenosis. The mid and distal LAD have diffuse moderate disease.  Left circumflex:  The left circumflex is patent.  There is slow flow in the first marginal branch.  The vessel has 50-70% sequential stenoses in the mid vessel with heavy calcification.  There is no critical  disease in the left circumflex.  Right coronary artery:  The right coronary artery has diffuse calcific stenosis.  The mid vessel has 50% stenosis.  The distal vessel has 70- 80% stenosis.  The vessel is dominant and it supplies a PDA and a posterolateral branch, both of which are patent.  I was called emergently after diagnostic coronary angiography, as the patient developed marked ST-segment elevation after initial coronary angiography along with severe substernal chest pain.  She was quickly given 5000 units of intravenous heparin, and the sheath in her femoral artery was up-sized by Dr. Eden Emms to a 6-French.  A 6-French XB LAD 3.5 cm guide catheter was inserted.  A cougar guidewire was advanced across the lesion into the mid LAD.  I initially tried to advance a 2.5 x 15 mm Trek balloon across, but it would not cross the lesion.  A 1.5-mm balloon was then advanced across and it was taken to 14 atmospheres. This restored TIMI-3 flow in the LAD.  The original 2.5 balloon was then used to cross the lesion, and it was dilated to 8 atmospheres on a single inflation.  At that point, the patient had stabilized.  Her ST- segment elevation had resolved.  A 2.75 x 16 mm PROMUS Element drug- eluting stent was carefully positioned  and deployed at 12 atmospheres. The stent appeared well expanded.  It was deployed with the proximal edge of the stent just beyond the LAD ostium.  The stent was deployed at 12 atmospheres.  The stented segment was then post dilated with a 3.5 x 12 mm noncompliant balloon, which was taken to 14 atmospheres on 2 inflations.  There was a good angiographic result.  The stent appeared well expanded.  Off the distal edge of the stent, there was residual stenosis throughout the mid vessel with calcification.  There was not a good starting or stopping point for more stents, and I felt the patient had received her maximum benefit from the PCI procedure.  She will receive  maximal medical therapy from here forward.  She had TIMI-3 flow with a good result.  Her femoral arteriotomy was closed with a Perclose device.  There was a venous sheath placed when she became acutely unstable and this will be left in place and removed with manual hemostasis once her ACT is less than 150.  The patient will transfer back to the CCU.  FINAL CONCLUSION: 1. Nonobstructive left main disease. 2. Critical LAD stenosis with emergent stenting of the proximal LAD. 3. Moderate left circumflex stenosis. 4. Moderate-to-severe right coronary artery stenosis.  FINAL RECOMMENDATIONS:  The patient will be continued on Integrilin for an additional 4 hours.  We will try to minimize total duration of Integrilin considering her advanced age and anemia.  Of note, she was stool guaiac-negative before the procedure.  She will be continued on aspirin and Plavix and should stay on these medications for at least another 12 months.  She will be managed medically for her residual coronary artery disease.     Veverly Fells. Excell Seltzer, MD     MDC/MEDQ  D:  03/23/2011  T:  03/24/2011  Job:  [id number]  Electronically Signed by Tonny Bollman MD on 03/29/2011 10:09:56 PM

## 2011-03-30 LAB — CBC
Hemoglobin: 8.4 g/dL — ABNORMAL LOW (ref 12.0–15.0)
MCHC: 32.4 g/dL (ref 30.0–36.0)
RBC: 2.92 MIL/uL — ABNORMAL LOW (ref 3.87–5.11)
WBC: 9.7 10*3/uL (ref 4.0–10.5)

## 2011-03-30 LAB — GLUCOSE, CAPILLARY: Glucose-Capillary: 138 mg/dL — ABNORMAL HIGH (ref 70–99)

## 2011-03-31 DIAGNOSIS — I509 Heart failure, unspecified: Secondary | ICD-10-CM

## 2011-03-31 DIAGNOSIS — I219 Acute myocardial infarction, unspecified: Secondary | ICD-10-CM

## 2011-03-31 DIAGNOSIS — R5381 Other malaise: Secondary | ICD-10-CM

## 2011-03-31 LAB — GLUCOSE, CAPILLARY
Glucose-Capillary: 127 mg/dL — ABNORMAL HIGH (ref 70–99)
Glucose-Capillary: 157 mg/dL — ABNORMAL HIGH (ref 70–99)
Glucose-Capillary: 103 mg/dL — ABNORMAL HIGH (ref 70–99)

## 2011-04-01 LAB — CBC
HCT: 24.2 % — ABNORMAL LOW (ref 36.0–46.0)
HCT: 36.3 % (ref 36.0–46.0)
Hemoglobin: 11.9 g/dL — ABNORMAL LOW (ref 12.0–15.0)
MCHC: 32.8 g/dL (ref 30.0–36.0)
MCV: 90.6 fL (ref 78.0–100.0)
MCV: 87.3 fL (ref 78.0–100.0)
RBC: 2.67 MIL/uL — ABNORMAL LOW (ref 3.87–5.11)
RDW: 13.8 % (ref 11.5–15.5)
RDW: 14.8 % (ref 11.5–15.5)
WBC: 6.3 10*3/uL (ref 4.0–10.5)

## 2011-04-01 LAB — BASIC METABOLIC PANEL
BUN: 12 mg/dL (ref 6–23)
CO2: 33 mEq/L — ABNORMAL HIGH (ref 19–32)
Chloride: 97 mEq/L (ref 96–112)
Creatinine, Ser: 1 mg/dL (ref 0.50–1.10)
Glucose, Bld: 108 mg/dL — ABNORMAL HIGH (ref 70–99)

## 2011-04-01 LAB — GLUCOSE, CAPILLARY: Glucose-Capillary: 125 mg/dL — ABNORMAL HIGH (ref 70–99)

## 2011-04-02 ENCOUNTER — Telehealth: Payer: Self-pay | Admitting: Cardiology

## 2011-04-02 LAB — CROSSMATCH: Unit division: 0

## 2011-04-02 MED ORDER — NITROGLYCERIN 0.4 MG SL SUBL: 0.4000 mg | SUBLINGUAL_TABLET | SUBLINGUAL | Status: DC | PRN

## 2011-04-02 NOTE — Telephone Encounter (Signed)
Patient states that she was given RX for NTG last night upon being discharged from [facility].  States that she has lost RX and wants to know if we can call that in.  She has post hosp appointment scheduled with [facility] for 04/15/11.[name] / tg

## 2011-04-14 ENCOUNTER — Telehealth: Payer: Self-pay | Admitting: Pulmonary Disease

## 2011-04-14 NOTE — Telephone Encounter (Signed)
Spoke with pt. She states is fine with seeing TP for her HFU- I transferred her to schedulers to make this appt.

## 2011-04-15 ENCOUNTER — Encounter: Payer: Medicare Other | Admitting: Adult Health

## 2011-04-20 ENCOUNTER — Ambulatory Visit (INDEPENDENT_AMBULATORY_CARE_PROVIDER_SITE_OTHER): Payer: Medicare Other | Admitting: Adult Health

## 2011-04-20 ENCOUNTER — Encounter: Payer: Self-pay | Admitting: Adult Health

## 2011-04-20 ENCOUNTER — Encounter: Payer: Medicare Other | Admitting: Adult Health

## 2011-04-20 ENCOUNTER — Other Ambulatory Visit: Payer: Self-pay | Admitting: Adult Health

## 2011-04-20 VITALS — BP 138/78 | HR 83 | Resp 18 | Ht 66.0 in | Wt 188.1 lb

## 2011-04-20 DIAGNOSIS — I2589 Other forms of chronic ischemic heart disease: Secondary | ICD-10-CM

## 2011-04-20 DIAGNOSIS — I10 Essential (primary) hypertension: Secondary | ICD-10-CM

## 2011-04-20 DIAGNOSIS — D649 Anemia, unspecified: Secondary | ICD-10-CM

## 2011-04-20 DIAGNOSIS — I255 Ischemic cardiomyopathy: Secondary | ICD-10-CM

## 2011-04-20 DIAGNOSIS — I519 Heart disease, unspecified: Secondary | ICD-10-CM

## 2011-04-20 DIAGNOSIS — Z9889 Other specified postprocedural states: Secondary | ICD-10-CM

## 2011-04-20 DIAGNOSIS — I2510 Atherosclerotic heart disease of native coronary artery without angina pectoris: Secondary | ICD-10-CM

## 2011-04-20 NOTE — Assessment & Plan Note (Signed)
There is no evidence of fluid overload today. She remains on lasix 40 mg daily. Breathing status may be related to sarcoidosis as well along with occasional palpitations. Will continue to follow.

## 2011-04-20 NOTE — Patient Instructions (Signed)
Your physician recommends that you schedule a follow-up appointment in: 3 months with Dr Dietrich Pates  Your physician recommends that you return for lab work in: Today and results will be sent to Dr Kriste Basque  Hemoccult cards x 3 and return to office as soon as possible

## 2011-04-20 NOTE — Progress Notes (Signed)
HPI: Alexandra [name] is a 77 y/o patient of Dr. Kristeen Miss we are seeing post hospitalization from NSTEMI with PCI of LAD due to a 99% LAD stenosis.  DES was used. She was placed on Plavix.   She was also found to be anemic (Hgb. of 7.7) prior to discharge and went in to pulmonary edema.  This was treated with diuretics and blood transfusion. She was found to have thrombocytopenia.  She states she is feeling better but continues to have some complaints of shortness of breath with exertion-making her bed. Denies recurrent chest pain or dizziness.She has occasional racing heart which causes her throat to hurt, which is chronic for her, despite PCI.  Goes away with rest.  Allergies  Allergen Reactions  . Codeine   . Diazepam     REACTION: causes pt unable to wake up after taking  . Lipitor (Atorvastatin Calcium)   . Penicillins   . Sulfonamide Derivatives     Current Outpatient Prescriptions  Medication Sig Dispense Refill  . acetaminophen (TYLENOL) 500 MG tablet Take 500 mg by mouth every 6 (six) hours as needed.        [name] amitriptyline (ELAVIL) 10 MG tablet Take 10 mg by mouth at bedtime. One po daily       . Ascorbic Acid (VITAMIN C) 500 MG tablet Take 500 mg by mouth daily. Take one po bid with iron tablet       . aspirin EC 81 MG tablet Take 81 mg by mouth daily.        . AVAPRO 150 MG tablet TAKE (1) TABLET BY MOUTH ONCE DAILY.  30 each  6  . Cholecalciferol (VITAMIN D) 1000 UNITS capsule Take 1,000 Units by mouth daily. One po qd       . Cyanocobalamin (VITAMIN B 12) 100 MCG LOZG Take by mouth.        . ferrous sulfate 324 (65 FE) MG TBEC Take by mouth. One po bid       . furosemide (LASIX) 40 MG tablet Take 1 tablet (40 mg total) by mouth daily. One po qd  30 tablet  6  . glucose blood test strip 1 each by Other route as needed. Use as instructed       . meclizine (ANTIVERT) 25 MG tablet TAKE 1 TABLET BY MOUTH EVERY 4 HOURS AS NEEDED FOR  DIZZINESS.  50 tablet  5  . metFORMIN (GLUCOPHAGE) 500 MG  tablet TAKE 1 TABLET TWICE DAILY  60 tablet  3  . metoprolol (LOPRESSOR) 50 MG tablet Take 1 tablet (50 mg total) by mouth 2 (two) times daily.  60 tablet  11  . nitroGLYCERIN (NITROSTAT) 0.4 MG SL tablet Place 1 tablet (0.4 mg total) under the tongue every 5 (five) minutes as needed for chest pain.  90 tablet  3  . PLAVIX 75 MG tablet TAKE 1 TABLET ONCE DAILY.  30 each  6  . VISTARIL 25 MG capsule TAKE (1) CAPSULE EVERY SIX HOURS AS NEEDED.  50 each  5  . ZOCOR 40 MG tablet TAKE ONE TABLET BY MOUTH AT BEDTIME.  30 each  11   Current Facility-Administered Medications  Medication Dose Route Frequency Provider Last Rate Last Dose  . DISCONTD: methylPREDNISolone acetate (DEPO-MEDROL) injection 40 mg  40 mg Intra-articular Once Fuller Canada, MD      . DISCONTD: methylPREDNISolone acetate (DEPO-MEDROL) injection 40 mg  40 mg Intra-articular Once Fuller Canada, MD      . DISCONTD: methylPREDNISolone  acetate (DEPO-MEDROL) injection 40 mg  40 mg Intra-articular Once Fuller Canada, MD      . DISCONTD: methylPREDNISolone acetate (DEPO-MEDROL) injection 40 mg  40 mg Intra-articular Once Fuller Canada, MD        Past Medical History  Diagnosis Date  . Asthmatic bronchitis   . Sarcoidosis   . HTN (hypertension)   . PVD (peripheral vascular disease)   . Venous insufficiency   . Hyperlipemia   . DM (diabetes mellitus)   . Morbid obesity   . GERD (gastroesophageal reflux disease)   . Diverticulosis of colon   . IBS (irritable bowel syndrome)   . DJD (degenerative joint disease)   . Vitamin D deficiency disease   . Fibromyalgia   . Anxiety   . Intermittent vertigo   . Anemia   . Diverticulosis of colon   . Knee pain   . Hip pain   . Anserine bursitis   . Bursitis of hip   . Palpitations   . Patellar subluxation     malaignment     Past Surgical History  Procedure Date  . Cholecystectomy   . Tonsillectomy   . Hernia repair   . Cataract extraction     GNF:AOZHYQ of  systems complete and found to be negative unless listed abovePHYSICAL EXAM BP 138/78  Pulse 83  Resp 18  Ht 5\' 6"  (1.676 m)  Wt 188 lb 1.9 oz (85.331 kg)  BMI 30.36 kg/m2  SpO2 96%    ASSESSMENT AND PLAN

## 2011-04-20 NOTE — Assessment & Plan Note (Signed)
She is low normal today. Will continue to monitor this to avoid hypotension on diuretics and ARB.  May need to titrate medications down, to lower dose.

## 2011-04-20 NOTE — Assessment & Plan Note (Signed)
She is without chest pain, but continues to have symptoms of DOE. She is worried that her Hgb is getting low again. She is tolerating the plavix well without overt bleeding or tarry stools.  She is easily tired doing household chores, such as making her bed. I will check a CBC to evaluate her status, and also a BMET to evaluate kidney fx. She will be given hemoccult cards as well.  She will follow-up with Dr. Dietrich Pates in 3 months.

## 2011-04-21 LAB — BASIC METABOLIC PANEL
CO2: 26 mEq/L (ref 19–32)
Chloride: 100 mEq/L (ref 96–112)
Potassium: 4.6 mEq/L (ref 3.5–5.3)
Sodium: 138 mEq/L (ref 135–145)

## 2011-04-21 LAB — CBC
Platelets: 189 10*3/uL (ref 150–400)
RBC: 4.12 MIL/uL (ref 3.87–5.11)
WBC: 9.8 10*3/uL (ref 4.0–10.5)

## 2011-04-22 ENCOUNTER — Other Ambulatory Visit: Payer: Self-pay | Admitting: *Deleted

## 2011-04-22 MED ORDER — METFORMIN HCL 500 MG PO TABS: 500.0000 mg | ORAL_TABLET | Freq: Two times a day (BID) | ORAL | Status: DC

## 2011-04-27 ENCOUNTER — Encounter: Payer: Self-pay | Admitting: Adult Health

## 2011-04-27 ENCOUNTER — Ambulatory Visit (INDEPENDENT_AMBULATORY_CARE_PROVIDER_SITE_OTHER): Payer: Medicare Other | Admitting: Adult Health

## 2011-04-27 DIAGNOSIS — I519 Heart disease, unspecified: Secondary | ICD-10-CM

## 2011-04-27 DIAGNOSIS — K219 Gastro-esophageal reflux disease without esophagitis: Secondary | ICD-10-CM

## 2011-04-27 DIAGNOSIS — I10 Essential (primary) hypertension: Secondary | ICD-10-CM

## 2011-04-27 DIAGNOSIS — I2510 Atherosclerotic heart disease of native coronary artery without angina pectoris: Secondary | ICD-10-CM

## 2011-04-27 NOTE — Assessment & Plan Note (Signed)
Controlled on rx   

## 2011-04-27 NOTE — Patient Instructions (Addendum)
Continue on current regimen.  Have a [employer] Christmas Keep up good work  follow up Dr. Kriste Basque  In 4 months and As needed

## 2011-04-27 NOTE — Assessment & Plan Note (Signed)
Cont follow up with cards as planned  

## 2011-04-27 NOTE — Progress Notes (Signed)
Subjective:    Patient ID: Alexandra [name], female    DOB: July 25, 1923, 77 y.o.   MRN: [id number]  HPI  77y/o WF with known multiple medical problems including AB, hx inactive Sarcoid, HBP, PVD, VI, Hyperlipidemia, DM, Obesity, GERD/ Divertics/ IBS, DJD/ FM/ osteop, Anxiety, Anemia...  ~  March 19, 2010:  4 mo ROV & she notes stable- Hydroxyzine helps "throat" episodes that occur w/ anxiety; no intercurrent infections;  BP stable on meds;  still too sedentary & not effectively dieting (wt w/o change);  she had BMD 6/11= osteoporotic in left FemNeck & Alendronate recommended to her but she flat out refuses to take the Bisphos Rx "I'll take my chances"...  ~  July 22, 2010:  741mo ROV & weight is down 13# on diet;  she's had occas episodes of palpit, heart racing & notes "throat closing" sensation when this occurs, noted w/ activity & valsalva maneuver eg bending down & assoc w/ SOB but denies CP;  EKG w/ atrial arrhythmias/ PACs & we discussed need for further Cards eval- she requests the [facility] office;  she is currently on ASA, Plavix, Metoprolol 25mg Bid, Avapro 150mg /d, Lasix 40mg /d;  BP well controlled on this...    She has hx old burnt-out Sarcoid> yearly CXR today shows mild cardiomeg, tort Ao, chr incr markings, NAD;  denies cough, sput, chr in her baseline SOB/ DOE, etc...    Her FLP looks good on Simva40;  DM control is excellent on Metformin Bid;  weight down to 190# as noted;  GI appears stable & is followed in [place] now;  Ortho per DrSHarrison in [place] w/ shots every 41mo or so!;  hx Anemia & Hg improved to 11.6 today, Fe=51, Sat=16%, B12 is high;  she uses Hydroxyzine (Vistaril) 25mg  Prn for nerves...  ~  November 18, 2010:  741mo ROV & she seems more anxious & sl more agitated than prev, also weaker, more feeble (ie couldn't step up onto the exam table); she is concerned for her cardiac status> DrHochrein did Holter monitor w/ PAcs/ PVCs & no dangerous arrhythmias; CDopplers ordered  for ?CBruit but I cannot find the results in EPIC;  He increased her Metoprolol to 50mg Bid, and she increased her ASA to 81mg Bid as well... She notes that her HYDROXYZINE (Vistaril) helps more than anything & I mentioned it was OK to take this regularly;  See prob list below>>  04/27/2011 Post Hospital follow up  Recent admission 03/23/11 for  NSTEMI with PCI of LAD due to a 99% LAD stenosis.Started on plavix. Had some anemia during admission along with pulmonary edema that responded to diuresis. follow up CBC post discharge with hgb back to normal.   Since discharge feels weak but no more "spells" like she was having.  She denies chest pain, dyspnea or increased edema.   Problem List:  Hx of ASTHMATIC BRONCHITIS, ACUTE (ICD-466.0) - uses Mucinex as needed... she has chr stable DOE w/o change... she say she walks "some" but is obviously too sedentary due to her arthritis... she has PNEUMOVAX several yrs ago.  Hx of SARCOIDOSIS (ICD-135) - initially diagnosed in 1992 w/ CXR showing diffuse ILDis, +Gallium scan, ACE=61, & Bronch w/ bx showing non-caseating granulomas... treated w/ Pred & weaned off w/ no active disease since then... last CTChest 7/04 w/ ca++ Ao & coronaries, +mediastinal fat, centilobular emphysema, some scarring... stable without new symptoms of cough, sputum, CP or dyspnea... ~  CXR 1/09 showed stable chr lung changes & prom right hilum... ~  CXR 2/11 showed diffuse ILDz, no adenopathy, NAD.[name]. ~  CXR 2/12 showed borderline cardiomeg, tort Ao, sl incr markings as before, NAD...  HYPERTENSION (ICD-401.9) - controlled on meds: METOPROLOL 50mg Bid, AVAPRO 150mg /d, LASIX 40mg /d... BP= 136/74 today, tol Rx well...  denies HA, visual changes, CP, palipit, syncope, change in dyspnea, edema, etc... ~  Myoview 12/05 was neg- no ischemia, no infarct... ~  EKG 2/11 showed NSR, rate 82/min, WNL.[name]. ~  4/12:  DrHochrein increased her Metoprolol from 25Bid to 50Bid for her BP &  ectopy...  PALPITATIONS & Ventric Ectopy>  Eval & Rx by DrHochrein, his notes are reviewed... On METOPROLOL 50mg Bid & she uses HYDROXYZINE (Vistaril) 25mg  Prn which she says is helpful...  PERIPHERAL VASCULAR DISEASE (ICD-443.9) - on ASA 81mg /d, PLAVIX 75mg /d... doing well w/o claud symptoms... ~  Art Dopplers of LE's 6/06 showed biphasic waveforms in ant tib & peroneal arts, normal ABI's...  CAD >03/23/11 for  NSTEMI with PCI of LAD due to a 99% LAD stenosis--F/BY DR ROTHBART   VENOUS INSUFFICIENCY (ICD-459.81) - on low sodium diet and the Lasix... she knows to elevate legs, wear support hose, etc...  HYPERLIPIDEMIA (ICD-272.4) - on SIMVASTATIN 40mg /d... ~  FLP 1/08 showed TChol 121, TG 120, HDL 46, LDL 51... looks great- continue Simva40. ~  FLP 1/09 showed TChol 137, TG 165, HDL 37, LDL 67 ~  FLP 9/09 showed TChol 133, TG 141, HDL 47, LDL 57 ~  FLP 5/10 showed TChol 131, TG 190, HDL 46, LDL 47 ~  FLP 2/11 showed TChol 129, TG 129, HDL 52, LDL 51 ~  FLP 2/12 showed TChol 134, TG 159, HDL 49, LDL 53  DM (ICD-250.00) - on diet + METFORMIN 500mg - 1Bid... unable to exercise or lose wt...  ~  labs 1/08 showed BS=130 and HgA1c=6.1 ~  labs 1/09 showed BS= 137, HgA1c= 6.2... diet and exercise are the keys!!! ~  labs 9/09 showed BS= 119, HgA1c= 6.3 ~  labs 5/10 showed BS= 134, A1c= 6.3 ~  labs 2/11 showed BS= 122, A1c= 6.3 ~  labs 6/11 showed BS= 106 ~  Labs 2/12 showed BS= 138, A1c= 6.3  MORBID OBESITY (ICD-278.01) - prev wt 227#  & 5\' 2"  tall for a BMI=41-42...  ~  weight 1/10 = 217# ~  weight 5/10 = 215# ~  weight 9/10 = 210# ~  weight 2/11 = 210# ~  weight 6/11 = 203# ~  weight 10/11 = 203# ~  Weight 6/12 = 195#  GERD (ICD-530.81) - pt uses OTC Prilosec Prn... denies CP, dysphagia, reflux, N/ V, etc...  DIVERTICULOSIS, COLON (ICD-562.10) - had colonoscopy by DrDBrodie 1997 that was neg x divertics... now followed in [place]...  IRRITABLE BOWEL SYNDROME  (ICD-564.1)  DEGENERATIVE JOINT DISEASE, ADVANCED (ICD-715.90) - she has mod severe bilat knee pain and sees DrHarrison, Ortho for shots "every three months"... she is opposed to surgery (see his EMR notes- reviewed)...  VITAMIN D DEFICIENCY (ICD-268.9) ~  labs 5/10 showed Vit D level =13... therefore rec Vit D 50000 u weekly. ~  labs 2/11 showed Vit D level = 32... OK to change to 1000 u daily.. ~  BMD done 6/11 showed TScores -1.3 in Spine, and -2.5 in left FemNeck... rec> start Alendronate 70mg /wk but she refuses Bisphos Rx "I'll take mu=y chances"...  FIBROMYALGIA (ICD-729.1)  ANXIETY (ICD-300.00) - we perscribed HYDROXYZINE PAMOATE 75mg /d Prn for "throat closing" symptoms...  ANEMIA (ICD-285.9) - hx mild anemia w/ Hg = 10.8 Jan09 & started Fe daily  at that time...  ~  labs 5/09 showed Hg= 11.7, Fe= 64 ~  labs 9/09 showed Hg= 11.4, Fe= 43... rec- take FeSO4 w/ Vit C 500mg /d. ~  labs 5/10 showed Hg= 11.3,  Fe= 50 ~  labs 2/11 showed Hg= 10.7, Fe= 36 (12%)... rec> incr Fe Vit C to Bid. ~  labs 6/11 showed Hg= 10.9, Fe= 58 (19%sat)... continue Bid Fe ~  Labs 2/12 showed Hg= 11.6, Fe= 51 (16%sat)... Continue same.  INTERMITTENT VERTIGO (ICD-780.4) - prev eval DrWeymann w/ Meclizine Prn...   Past Surgical History  Procedure Date  . Cholecystectomy   . Tonsillectomy   . Hernia repair   . Cataract extraction     Outpatient Encounter Prescriptions as of 04/27/2011  Medication Sig Dispense Refill  . acetaminophen (TYLENOL) 500 MG tablet Take 500 mg by mouth every 6 (six) hours as needed.        [name] amitriptyline (ELAVIL) 10 MG tablet Take 10 mg by mouth at bedtime. One po daily       . Ascorbic Acid (VITAMIN C) 500 MG tablet Take 500 mg by mouth daily. Take one po bid with iron tablet      . aspirin EC 81 MG tablet Take 81 mg by mouth daily.        . AVAPRO 150 MG tablet TAKE (1) TABLET BY MOUTH ONCE DAILY.  30 each  6  . Cholecalciferol (VITAMIN D) 1000 UNITS capsule Take 1,000 Units  by mouth daily. One po qd       . Cyanocobalamin (VITAMIN B 12) 100 MCG LOZG Take by mouth.        . ferrous sulfate 324 (65 FE) MG TBEC Take by mouth. One po bid       . furosemide (LASIX) 40 MG tablet Take 1 tablet (40 mg total) by mouth daily. One po qd  30 tablet  6  . glucose blood test strip 1 each by Other route as needed. Use as instructed       . meclizine (ANTIVERT) 25 MG tablet TAKE 1 TABLET BY MOUTH EVERY 4 HOURS AS NEEDED FOR  DIZZINESS.  50 tablet  5  . metFORMIN (GLUCOPHAGE) 500 MG tablet Take 1 tablet (500 mg total) by mouth 2 (two) times daily.  60 tablet  5  . metoprolol (LOPRESSOR) 50 MG tablet Take 1 tablet (50 mg total) by mouth 2 (two) times daily.  60 tablet  11  . nitroGLYCERIN (NITROSTAT) 0.4 MG SL tablet Place 1 tablet (0.4 mg total) under the tongue every 5 (five) minutes as needed for chest pain.  90 tablet  3  . PLAVIX 75 MG tablet TAKE 1 TABLET ONCE DAILY.  30 each  6  . VISTARIL 25 MG capsule TAKE (1) CAPSULE EVERY SIX HOURS AS NEEDED.  50 each  5  . ZOCOR 40 MG tablet TAKE ONE TABLET BY MOUTH AT BEDTIME.  30 each  11    Allergies  Allergen Reactions  . Codeine   . Diazepam     REACTION: causes pt unable to wake up after taking  . Lipitor (Atorvastatin Calcium)   . Penicillins   . Sulfonamide Derivatives     Review of Systems Constitutional:   No  weight loss, night sweats,  Fevers, chills, + fatigue, or  lassitude.  HEENT:   No headaches,  Difficulty swallowing,  Tooth/dental problems, or  Sore throat,  No sneezing, itching, ear ache, nasal congestion, post nasal drip,   CV:  No chest pain,  Orthopnea, PND, swelling in lower extremities, anasarca, dizziness, palpitations, syncope.   GI  No heartburn, indigestion, abdominal pain, nausea, vomiting, diarrhea, change in bowel habits, loss of appetite, bloody stools.   Resp:   No excess mucus, no productive cough,  No non-productive cough,  No coughing up of blood.  No change in color of  mucus.  No wheezing.  No chest wall deformity  Skin: no rash or lesions.  GU: no dysuria, change in color of urine, no urgency or frequency.  No flank pain, no hematuria   MS:  No joint pain or swelling.  No decreased range of motion.   Psych:  No change in mood or affect. No depression or anxiety.  No memory loss.               Objective:   Physical Exam       WD, Obese, 77 y/o WF in NAD... GENERAL:  Alert & oriented; pleasant & cooperative. HEENT:  [place]/AT, Glasses, EOM-full, PERRLA, TMs-clear NOSE-clear, THROAT-clear & wnl. NECK:  Supple w/ fairROM; no JVD; no carotid bruits; no thyromegaly or nodules palpated; no lymphadenopathy. CHEST:  decr BS bilat but clear w/o wheezing, rales or signs of consolidation... HEART:   RRR ; without murmurs/ rubs/ or gallops detected... ABDOMEN:  Obese w/ panniculus, soft & nontender; normal bowel sounds; no organomegaly or masses palpated. EXT:  mod arthritic changes; no varicose veins/ +venous insuffic/ tr+edema bilat NEURO:   no focal neuro deficits... DERM:  , no rash etc...   Assessment & Plan:

## 2011-04-28 ENCOUNTER — Telehealth: Payer: Self-pay | Admitting: *Deleted

## 2011-04-28 NOTE — Telephone Encounter (Signed)
Lab results called to patient

## 2011-06-23 ENCOUNTER — Encounter: Payer: Self-pay | Admitting: Orthopedic Surgery

## 2011-06-23 ENCOUNTER — Ambulatory Visit (INDEPENDENT_AMBULATORY_CARE_PROVIDER_SITE_OTHER): Payer: Medicare Other | Admitting: Orthopedic Surgery

## 2011-06-23 VITALS — BP 110/60 | Ht 66.0 in | Wt 190.0 lb

## 2011-06-23 DIAGNOSIS — M1710 Unilateral primary osteoarthritis, unspecified knee: Secondary | ICD-10-CM

## 2011-06-23 NOTE — Progress Notes (Signed)
Patient ID: Alexandra [name], female   DOB: 07-02-1923, 78 y.o.   MRN: [id number]  Request injections  Knee  Injection Procedure Note  Pre-operative Diagnosis: left knee oa  Post-operative Diagnosis: same  Indications: pain  Anesthesia: ethyl chloride   Procedure Details   Verbal consent was obtained for the procedure. Time out was completed.The joint was prepped with alcohol, followed by  Ethyl chloride spray and A 20 gauge needle was inserted into the knee via lateral approach; 4ml 1% lidocaine and 1 ml of depomedrol  was then injected into the joint . The needle was removed and the area cleansed and dressed.  Complications:  None; patient tolerated the procedure well.  Knee  Injection Procedure Note  Pre-operative Diagnosis: right knee oa  Post-operative Diagnosis: same  Indications: pain  Anesthesia: ethyl chloride   Procedure Details   Verbal consent was obtained for the procedure. Time out was completed.The joint was prepped with alcohol, followed by  Ethyl chloride spray and A 20 gauge needle was inserted into the knee via lateral approach; 4ml 1% lidocaine and 1 ml of depomedrol  was then injected into the joint . The needle was removed and the area cleansed and dressed.  Complications:  None; patient tolerated the procedure well.

## 2011-06-23 NOTE — Patient Instructions (Signed)
You have received a steroid shot. 15% of patients experience increased pain at the injection site with in the next 24 hours. This is best treated with ice and tylenol extra strength 2 tabs every 8 hours. If you are still having pain please call the office.    

## 2011-07-21 ENCOUNTER — Encounter: Payer: Self-pay | Admitting: *Deleted

## 2011-07-21 ENCOUNTER — Encounter: Payer: Self-pay | Admitting: Cardiology

## 2011-07-21 ENCOUNTER — Ambulatory Visit (INDEPENDENT_AMBULATORY_CARE_PROVIDER_SITE_OTHER): Payer: Medicare Other | Admitting: Cardiology

## 2011-07-21 VITALS — BP 145/75 | HR 80 | Resp 18 | Ht 66.0 in | Wt 192.0 lb

## 2011-07-21 DIAGNOSIS — E785 Hyperlipidemia, unspecified: Secondary | ICD-10-CM

## 2011-07-21 DIAGNOSIS — D649 Anemia, unspecified: Secondary | ICD-10-CM

## 2011-07-21 DIAGNOSIS — M1990 Unspecified osteoarthritis, unspecified site: Secondary | ICD-10-CM | POA: Insufficient documentation

## 2011-07-21 DIAGNOSIS — I2510 Atherosclerotic heart disease of native coronary artery without angina pectoris: Secondary | ICD-10-CM

## 2011-07-21 DIAGNOSIS — R0989 Other specified symptoms and signs involving the circulatory and respiratory systems: Secondary | ICD-10-CM

## 2011-07-21 MED ORDER — AMLODIPINE BESYLATE 2.5 MG PO TABS: 2.5000 mg | ORAL_TABLET | Freq: Every day | ORAL | Status: DC

## 2011-07-21 NOTE — Patient Instructions (Addendum)
Your physician recommends that you schedule a follow-up appointment in: 2 - 3 weeks  Your physician has requested that you have a lexiscan myoview. For further information please visit [email or web address] Please follow instruction sheet, as given.  Your physician has recommended you make the following change in your medication: Amlodipine 2.5 mg daily  Your physician recommends that you return for lab work in: CBC  (to have done @ Dr Jodelle Green office)

## 2011-07-21 NOTE — Progress Notes (Signed)
Patient ID: Alexandra [name], female   DOB: 1924/05/31, 78 y.o.   MRN: [id number] HPI: Loquacious and mentally sharp older woman previously treated by Dr. Antoine Poche, who has requested transferred to my practice.  This nice woman 1st presented in 07/2010 with palpitations.  Event recording documented frequent PACs without significant arrhythmias.  She suffered a non-Q myocardial infarction in 03/2011 and was found to have severe three-vessel coronary disease.  A culprit subtotal lesion in the LAD was stented with medical therapy advised for moderate lesions in the circumflex and RCA.  LV systolic function was normal overall with akinesis in the distribution of the distal LAD.  She did well subsequently, but now is experiencing recurrent spells similar to those that 1st brought her to our attention.  She reports an empty feeling in her chest when she exerts herself that is relieved with rest.  She denies orthopnea, PND, palpitations, lightheadedness or syncope.  She has help at home with housework and other household activities.  She has not driven since her MI.  Prior to Admission medications   Medication Sig Start Date End Date Taking? Authorizing Provider  acetaminophen (TYLENOL) 500 MG tablet Take 500 mg by mouth every 6 (six) hours as needed.     Yes Historical Provider, MD  amitriptyline (ELAVIL) 10 MG tablet Take 10 mg by mouth at bedtime. One po daily    Yes Historical Provider, MD  Ascorbic Acid (VITAMIN C) 500 MG tablet Take 500 mg by mouth daily. Take one po bid with iron tablet   Yes Historical Provider, MD  aspirin EC 81 MG tablet Take 81 mg by mouth daily.     Yes Historical Provider, MD  AVAPRO 150 MG tablet TAKE (1) TABLET BY MOUTH ONCE DAILY. 03/03/11  Yes [name], MD  Cholecalciferol (VITAMIN D) 1000 UNITS capsule Take 1,000 Units by mouth daily. One po qd    Yes Historical Provider, MD  Cyanocobalamin (VITAMIN B 12) 100 MCG LOZG Take by mouth.     Yes Historical Provider, MD  ferrous  sulfate 324 (65 FE) MG TBEC Take by mouth. One po bid    Yes Historical Provider, MD  furosemide (LASIX) 40 MG tablet Take 1 tablet (40 mg total) by mouth daily. One po qd 03/03/11  Yes [name], MD  glucose blood test strip 1 each by Other route as needed. Use as instructed    Yes Historical Provider, MD  meclizine (ANTIVERT) 25 MG tablet TAKE 1 TABLET BY MOUTH EVERY 4 HOURS AS NEEDED FOR  DIZZINESS. 10/28/10  Yes [name], MD  metFORMIN (GLUCOPHAGE) 500 MG tablet Take 1 tablet (500 mg total) by mouth 2 (two) times daily. 04/22/11  Yes [name], MD  metoprolol (LOPRESSOR) 50 MG tablet Take 1 tablet (50 mg total) by mouth 2 (two) times daily. 09/23/10 09/23/11 Yes Rollene Rotunda, MD  nitroGLYCERIN (NITROSTAT) 0.4 MG SL tablet Place 1 tablet (0.4 mg total) under the tongue every 5 (five) minutes as needed for chest pain. 04/02/11 04/01/12 Yes Gerrit Friends. Emeka Lindner, MD  PLAVIX 75 MG tablet TAKE 1 TABLET ONCE DAILY. 02/09/11  Yes [name], MD  VISTARIL 25 MG capsule TAKE (1) CAPSULE EVERY SIX HOURS AS NEEDED. 01/06/11  Yes [name], MD  ZOCOR 40 MG tablet TAKE ONE TABLET BY MOUTH AT BEDTIME. 02/26/11  Yes [name], MD  amLODipine (NORVASC) 2.5 MG tablet Take 1 tablet (2.5 mg total) by mouth daily. 07/21/11 07/20/12  Gerrit Friends. Dietrich Pates, MD    Allergies  Allergen Reactions  . Codeine   . Diazepam     REACTION: causes pt unable to wake up after taking  . Lipitor (Atorvastatin Calcium)   . Penicillins   . Sulfonamide Derivatives       Past medical history, social history, and family history reviewed and updated.  ROS: See history of present illness  PHYSICAL EXAM: BP 145/75  Pulse 80  Resp 18  Ht 5\' 6"  (1.676 m)  Wt 87.091 kg (192 lb)  BMI 30.99 kg/m2  General-Well developed; no acute distress Body habitus-overweight Neck-No JVD; left carotid bruits Lungs-clear lung fields; resonant to percussion Cardiovascular-normal PMI; normal S1 and S2; grade 2/6 systolic ejection  murmur at the cardiac base; irregular pulse Abdomen-normal bowel sounds; soft and non-tender without masses or organomegaly Musculoskeletal-No deformities, no cyanosis or clubbing Neurologic-Normal cranial nerves; symmetric strength and tone Skin-Warm, no significant lesions Extremities-distal pulses intact; trace edema  Rhythm Strip: Normal sinus rhythm at a rate of 60 bpm with frequent PACs and PVC pairs.  ASSESSMENT AND PLAN:  Tehama Bing, MD 07/21/2011 12:46 PM

## 2011-07-21 NOTE — Assessment & Plan Note (Signed)
Anemia has effectively resolved and may have been related to blood loss associated with treatment for acute myocardial infarction.

## 2011-07-21 NOTE — Assessment & Plan Note (Signed)
Patient initially did well, but now has recurrent symptoms.  She had substantial residual disease following intervention and heavily calcified arteries.  We will proceed with a pharmacologic stress test for reassessment and increase medical therapy by adding amlodipine 2.5 mg per day.

## 2011-07-21 NOTE — Assessment & Plan Note (Signed)
Asymptomatic and fairly mild cerebrovascular disease.  Due to patient's advanced age, I would not perform serial studies.

## 2011-07-21 NOTE — Assessment & Plan Note (Signed)
No lipid profile is available.  One will be obtained.

## 2011-07-30 ENCOUNTER — Encounter: Payer: Self-pay | Admitting: *Deleted

## 2011-07-30 ENCOUNTER — Telehealth: Payer: Self-pay | Admitting: *Deleted

## 2011-08-03 ENCOUNTER — Encounter (HOSPITAL_COMMUNITY)
Admission: RE | Admit: 2011-08-03 | Discharge: 2011-08-03 | Disposition: A | Discharge status: Home / Self Care | Payer: Medicare Other | Source: Ambulatory Visit | Attending: Cardiology | Admitting: Cardiology

## 2011-08-03 ENCOUNTER — Encounter (HOSPITAL_COMMUNITY): Payer: Self-pay

## 2011-08-03 ENCOUNTER — Encounter (HOSPITAL_COMMUNITY): Payer: Self-pay | Admitting: Cardiology

## 2011-08-03 ENCOUNTER — Ambulatory Visit (INDEPENDENT_AMBULATORY_CARE_PROVIDER_SITE_OTHER): Payer: Medicare Other

## 2011-08-03 DIAGNOSIS — R0789 Other chest pain: Secondary | ICD-10-CM

## 2011-08-03 DIAGNOSIS — I2510 Atherosclerotic heart disease of native coronary artery without angina pectoris: Secondary | ICD-10-CM | POA: Insufficient documentation

## 2011-08-03 DIAGNOSIS — R002 Palpitations: Secondary | ICD-10-CM | POA: Insufficient documentation

## 2011-08-03 MED ORDER — TECHNETIUM TC 99M TETROFOSMIN IV KIT
30.0000 | PACK | Freq: Once | INTRAVENOUS | Status: AC | PRN
  Administered 2011-08-03: 31 via INTRAVENOUS

## 2011-08-03 MED ORDER — TECHNETIUM TC 99M TETROFOSMIN IV KIT
10.0000 | PACK | Freq: Once | INTRAVENOUS | Status: AC | PRN
  Administered 2011-08-03: 9.2 via INTRAVENOUS

## 2011-08-03 NOTE — Progress Notes (Signed)
Stress Lab Nurses Notes - [facility]  BRITTINEE RISK 08/03/2011  Reason for doing test: CAD and Chest Pain Type of test: Steffanie Dunn Nurse performing test: Parke Poisson, RN Nuclear Medicine Tech: Lyndel Pleasure Echo Tech: Not Applicable MD performing test: Ival Bible & Joni Reining NP Family MD: Kriste Basque Test explained and consent signed: yes IV started: 22g jelco, Saline lock flushed, No redness or edema and Saline lock started in radiology Symptoms: SOB & Chest pain Treatment/Intervention: None Reason test stopped: protocol completed After recovery IV was: Discontinued via X-ray tech and No redness or edema Patient to return to Nuc. Med at : 12:45 Patient discharged: Home Patient's Condition upon discharge was: stable Comments: During test BP 128/68 & HR 138.  Recovery BP 148/68 & HR 80.  Symptoms resolved in recovery.  Erskine Speed T

## 2011-08-04 ENCOUNTER — Encounter: Payer: Self-pay | Admitting: Cardiology

## 2011-08-04 ENCOUNTER — Encounter: Payer: Self-pay | Admitting: *Deleted

## 2011-08-04 DIAGNOSIS — I471 Supraventricular tachycardia: Secondary | ICD-10-CM | POA: Insufficient documentation

## 2011-08-08 ENCOUNTER — Other Ambulatory Visit: Payer: Self-pay | Admitting: Pulmonary Disease

## 2011-08-10 ENCOUNTER — Ambulatory Visit (INDEPENDENT_AMBULATORY_CARE_PROVIDER_SITE_OTHER): Payer: Medicare Other | Admitting: Adult Health

## 2011-08-10 ENCOUNTER — Encounter: Payer: Self-pay | Admitting: Adult Health

## 2011-08-10 DIAGNOSIS — I6529 Occlusion and stenosis of unspecified carotid artery: Secondary | ICD-10-CM

## 2011-08-10 DIAGNOSIS — I2510 Atherosclerotic heart disease of native coronary artery without angina pectoris: Secondary | ICD-10-CM

## 2011-08-10 DIAGNOSIS — I679 Cerebrovascular disease, unspecified: Secondary | ICD-10-CM

## 2011-08-10 MED ORDER — METOPROLOL TARTRATE 50 MG PO TABS: ORAL_TABLET | ORAL | Status: DC

## 2011-08-10 NOTE — Assessment & Plan Note (Signed)
She requests follow-up carotid doppler ultrasound for known carotid disease. This will be ordered and completed prior to next appointment.

## 2011-08-10 NOTE — Assessment & Plan Note (Signed)
Stress myoview " Abnormal. Following infusion of lexiscan the patient deveoloped SVT, possibly reentrant mechanism with retrograde P-waves in lead V1. She was symptomatic with chest pain at the time, and had abnormal ST-segment changes. Rhythm spontaneously reverted to SR without intervention. Perfusion imaging shows perhaps a combination of soft tissue attenuation and scar in the anteroseptal distribution, minor ischemia at the apex. LVEF is normal 77% without wall motion abnormality."  I will increase metoprolol to 75 mg BID from 50 mg BID. She may need to be seen by EP if symptoms persist. Would have heart monitor placed again if she continues to have symptoms as well.  She will have follow-up appointment for BP check with increased dose of BB. She will be seen by Dr.Rothbart on follow-up.

## 2011-08-10 NOTE — Progress Notes (Signed)
Patient ID: DORIS GRUHN, female   DOB: August 03, 1923, 78 y.o.   MRN: [id number] HPI: Mrs. Geier is a 78 y/o patient of Dr. Dietrich Pates he follows for ongoing assessment and treatment of  Palpitations with known history of CAD.[name]  Event recording documented frequent PACs without significant arrhythmias.  She suffered a non-Q myocardial infarction in 03/2011 and was found to have severe three-vessel coronary disease.  A culprit subtotal lesion in the LAD was stented with medical therapy advised for moderate lesions in the circumflex and RCA.  LV systolic function was normal overall with akinesis in the distribution of the distal LAD.  She did well subsequently, but now is experiencing recurrent spells similar to those that 1st brought her to our attention.  She reports an empty feeling in her chest when she exerts herself that is relieved with rest.  She denies orthopnea, PND, palpitations, lightheadedness or syncope.  She has help at home with housework and other household activities.  She has not driven since her MI.    On last visit, Dr. Dietrich Pates scheduled her for stress nyoview to evaluate her HR response to exercise and for ischemia. She is here to discuss the results. She has no new complaints other than those listed above.  Prior to Admission medications   Medication Sig Start Date End Date Taking? Authorizing Provider  acetaminophen (TYLENOL) 500 MG tablet Take 500 mg by mouth every 6 (six) hours as needed.     Yes Historical Provider, MD  amitriptyline (ELAVIL) 10 MG tablet Take 10 mg by mouth at bedtime. One po daily    Yes Historical Provider, MD  Ascorbic Acid (VITAMIN C) 500 MG tablet Take 500 mg by mouth daily. Take one po bid with iron tablet   Yes Historical Provider, MD  aspirin EC 81 MG tablet Take 81 mg by mouth daily.     Yes Historical Provider, MD  AVAPRO 150 MG tablet TAKE (1) TABLET BY MOUTH ONCE DAILY. 03/03/11  Yes [name], MD  Cholecalciferol (VITAMIN D) 1000 UNITS capsule Take  1,000 Units by mouth daily. One po qd    Yes Historical Provider, MD  Cyanocobalamin (VITAMIN B 12) 100 MCG LOZG Take by mouth.     Yes Historical Provider, MD  ferrous sulfate 324 (65 FE) MG TBEC Take by mouth. One po bid    Yes Historical Provider, MD  furosemide (LASIX) 40 MG tablet Take 1 tablet (40 mg total) by mouth daily. One po qd 03/03/11  Yes [name], MD  glucose blood test strip 1 each by Other route as needed. Use as instructed    Yes Historical Provider, MD  meclizine (ANTIVERT) 25 MG tablet TAKE 1 TABLET BY MOUTH EVERY 4 HOURS AS NEEDED FOR  DIZZINESS. 10/28/10  Yes [name], MD  metFORMIN (GLUCOPHAGE) 500 MG tablet Take 1 tablet (500 mg total) by mouth 2 (two) times daily. 04/22/11  Yes [name], MD  metoprolol (LOPRESSOR) 50 MG tablet Take 1 tablet (50 mg total) by mouth 2 (two) times daily. 09/23/10 09/23/11 Yes Rollene Rotunda, MD  nitroGLYCERIN (NITROSTAT) 0.4 MG SL tablet Place 1 tablet (0.4 mg total) under the tongue every 5 (five) minutes as needed for chest pain. 04/02/11 04/01/12 Yes Gerrit Friends. Rothbart, MD  PLAVIX 75 MG tablet TAKE 1 TABLET ONCE DAILY. 02/09/11  Yes [name], MD  VISTARIL 25 MG capsule TAKE (1) CAPSULE EVERY SIX HOURS AS NEEDED. 01/06/11  Yes [name], MD  ZOCOR 40 MG tablet TAKE ONE TABLET BY MOUTH AT BEDTIME. 02/26/11  Yes [name], MD  amLODipine (NORVASC) 2.5 MG tablet Take 1 tablet (2.5 mg total) by mouth daily. 07/21/11 07/20/12  Gerrit Friends. Dietrich Pates, MD    Allergies  Allergen Reactions  . Codeine   . Diazepam     REACTION: causes pt unable to wake up after taking  . Lipitor (Atorvastatin Calcium)   . Penicillins   . Sulfonamide Derivatives      ROS: Review of systems complete and found to be negative unless listed above  PHYSICAL EXAM: BP 137/69  Pulse 81  Resp 18  Ht 5\' 6"  (1.676 m)  Wt 192 lb (87.091 kg)  BMI 30.99 kg/m2  General-Well developed; no acute distress Body habitus-overweight Neck-No JVD; left carotid  bruits Lungs-clear lung fields; resonant to percussion Cardiovascular-normal PMI; normal S1 and S2; grade 2/6 systolic ejection murmur at the cardiac base; irregular pulse Abdomen-normal bowel sounds; soft and non-tender without masses or organomegaly Musculoskeletal-No deformities, no cyanosis or clubbing Neurologic-Normal cranial nerves; symmetric strength and tone Skin-Warm, no significant lesions Extremities-distal pulses intact; trace edema    ASSESSMENT AND PLAN:  Joni Reining NP 08/10/2011 4:32 PM

## 2011-08-10 NOTE — Patient Instructions (Addendum)
Your physician recommends that you schedule a follow-up appointment in:  1 month provider follow up 1 week BP check  Your physician has requested that you have a carotid duplex. This test is an ultrasound of the carotid arteries in your neck. It looks at blood flow through these arteries that supply the brain with blood. Allow one hour for this exam. There are no restrictions or special instructions.  Your physician has requested that you regularly monitor and record your blood pressure readings at home. Please use the same machine at the same time of day to check your readings and record them to bring to your follow-up visit.  Your physician has recommended you make the following change in your medication:  Increase Metoprolol to 1 1/2 tablets (75 mg) twice a day

## 2011-08-17 ENCOUNTER — Ambulatory Visit (INDEPENDENT_AMBULATORY_CARE_PROVIDER_SITE_OTHER): Payer: Medicare Other

## 2011-08-17 VITALS — BP 130/63 | HR 58 | Ht 66.0 in | Wt 191.0 lb

## 2011-08-17 DIAGNOSIS — I10 Essential (primary) hypertension: Secondary | ICD-10-CM

## 2011-08-17 NOTE — Progress Notes (Signed)
**Note De-Identified Alexandra [name] Obfuscation** S: Pt. Arrives in office for a 1 week BP check with nurse. B: On last OV with K. Lawrence,NP on 3-11 pt. was advised to increase Metoprolol to 75 mg bid, to monitor and record BP's daily and to have Carotid duplex (scheduled for 4-8). A: Pt. has no complaints at this time. Her BP today is 130/63 and on last OV her BP was 137/69. She brought her BP diary to this visit and those readings are similar to today's BP but most are slightly lower.  R: Pt. Is advised to continue with current medical treatment and that we will contact her with Harriet Pho, NP's recommendations./LV

## 2011-08-18 ENCOUNTER — Encounter: Payer: Self-pay | Admitting: Cardiology

## 2011-08-19 NOTE — Progress Notes (Signed)
Reviewed. NO changes.

## 2011-08-19 NOTE — Progress Notes (Signed)
**Note De-identified Alexandra [name] Obfuscation** Pt. advised, she verbalized understanding./LV 

## 2011-08-25 ENCOUNTER — Other Ambulatory Visit (INDEPENDENT_AMBULATORY_CARE_PROVIDER_SITE_OTHER): Payer: Medicare Other

## 2011-08-25 ENCOUNTER — Ambulatory Visit (INDEPENDENT_AMBULATORY_CARE_PROVIDER_SITE_OTHER): Payer: Medicare Other | Admitting: Pulmonary Disease

## 2011-08-25 ENCOUNTER — Encounter: Payer: Self-pay | Admitting: Pulmonary Disease

## 2011-08-25 VITALS — BP 124/70 | HR 72 | Temp 97.3°F | Ht 66.0 in | Wt 192.2 lb

## 2011-08-25 DIAGNOSIS — E119 Type 2 diabetes mellitus without complications: Secondary | ICD-10-CM

## 2011-08-25 DIAGNOSIS — F419 Anxiety disorder, unspecified: Secondary | ICD-10-CM | POA: Insufficient documentation

## 2011-08-25 DIAGNOSIS — D649 Anemia, unspecified: Secondary | ICD-10-CM

## 2011-08-25 DIAGNOSIS — I679 Cerebrovascular disease, unspecified: Secondary | ICD-10-CM

## 2011-08-25 DIAGNOSIS — I10 Essential (primary) hypertension: Secondary | ICD-10-CM

## 2011-08-25 DIAGNOSIS — F411 Generalized anxiety disorder: Secondary | ICD-10-CM

## 2011-08-25 DIAGNOSIS — E785 Hyperlipidemia, unspecified: Secondary | ICD-10-CM

## 2011-08-25 DIAGNOSIS — I471 Supraventricular tachycardia, unspecified: Secondary | ICD-10-CM

## 2011-08-25 DIAGNOSIS — E663 Overweight: Secondary | ICD-10-CM

## 2011-08-25 DIAGNOSIS — R0609 Other forms of dyspnea: Secondary | ICD-10-CM

## 2011-08-25 DIAGNOSIS — M1990 Unspecified osteoarthritis, unspecified site: Secondary | ICD-10-CM

## 2011-08-25 DIAGNOSIS — R0600 Dyspnea, unspecified: Secondary | ICD-10-CM

## 2011-08-25 DIAGNOSIS — I2510 Atherosclerotic heart disease of native coronary artery without angina pectoris: Secondary | ICD-10-CM

## 2011-08-25 DIAGNOSIS — D869 Sarcoidosis, unspecified: Secondary | ICD-10-CM

## 2011-08-25 LAB — CBC WITH DIFFERENTIAL/PLATELET
Eosinophils Relative: 6.1 % — ABNORMAL HIGH (ref 0.0–5.0)
HCT: 33.5 % — ABNORMAL LOW (ref 36.0–46.0)
Lymphs Abs: 1.4 10*3/uL (ref 0.7–4.0)
Monocytes Relative: 3.9 % (ref 3.0–12.0)
Neutrophils Relative %: 74.5 % (ref 43.0–77.0)
Platelets: 179 10*3/uL (ref 150.0–400.0)
RBC: 3.73 Mil/uL — ABNORMAL LOW (ref 3.87–5.11)
WBC: 9.5 10*3/uL (ref 4.5–10.5)

## 2011-08-25 LAB — BASIC METABOLIC PANEL
BUN: 23 mg/dL (ref 6–23)
Creatinine, Ser: 1 mg/dL (ref 0.4–1.2)
GFR: 54.66 mL/min — ABNORMAL LOW (ref >60.00)
Glucose, Bld: 126 mg/dL — ABNORMAL HIGH (ref 70–99)
Potassium: 3.7 mEq/L (ref 3.5–5.1)

## 2011-08-25 LAB — BRAIN NATRIURETIC PEPTIDE: Pro B Natriuretic peptide (BNP): 309 pg/mL — ABNORMAL HIGH (ref 0.0–100.0)

## 2011-08-25 LAB — LIPID PANEL
Cholesterol: 112 mg/dL (ref 0–200)
HDL: 46 mg/dL (ref >39.00)
Triglycerides: 157 mg/dL — ABNORMAL HIGH (ref 0.0–149.0)
VLDL: 31.4 mg/dL (ref 0.0–40.0)

## 2011-08-25 LAB — HEPATIC FUNCTION PANEL
AST: 20 U/L (ref 0–37)
Albumin: 3.9 g/dL (ref 3.5–5.2)

## 2011-08-25 LAB — TSH: TSH: 4.11 u[IU]/mL (ref 0.35–5.50)

## 2011-08-25 NOTE — Patient Instructions (Signed)
Today we updated your med list in our EPIC system...    Continue your current medications the same...  Today we did your FASTING blood work...    We will call you w/ the results when available...  Let's plana routine follow up in 6 months, but call sooner for any problems.Marland KitchenMarland Kitchen

## 2011-08-25 NOTE — Progress Notes (Addendum)
Subjective:    Patient ID: Alexandra [name], female    DOB: 1924-01-05, 78 y.o.   MRN: [id number]  HPI 78 y/o WF here for a 4 month follow up visit... he has multiple medical problems including AB, hx inactive Sarcoid, HBP, PVD, VI, Hyperlipidemia, DM, Obesity, GERD/ Divertics/ IBS, DJD/ FM/ osteop, Anxiety, Anemia...  ~  March 19, 2010:  4 mo ROV & she notes stable- Hydroxyzine helps "throat" episodes that occur w/ anxiety; no intercurrent infections;  BP stable on meds;  still too sedentary & not effectively dieting (wt w/o change);  she had BMD 6/11= osteoporotic in left FemNeck & Alendronate recommended to her but she flat out refuses to take the Bisphos Rx "I'll take my chances"...  ~  July 22, 2010:  52mo ROV & weight is down 13# on diet;  she's had occas episodes of palpit, heart racing & notes "throat closing" sensation when this occurs, noted w/ activity & valsalva maneuver eg bending down & assoc w/ SOB but denies CP;  EKG w/ atrial arrhythmias/ PACs & we discussed need for further Cards eval- she requests the [facility] office;  she is currently on ASA, Plavix, Metoprolol 25mg Bid, Avapro 150mg /d, Lasix 40mg /d;  BP well controlled on this...    She has hx old burnt-out Sarcoid> yearly CXR today shows mild cardiomeg, tort Ao, chr incr markings, NAD;  denies cough, sput, chr in her baseline SOB/ DOE, etc...    Her FLP looks good on Simva40;  DM control is excellent on Metformin Bid;  weight down to 190# as noted;  GI appears stable & is followed in [place] now;  [place]o [place]arrison in [place] w/ shots every 124mo or so!;  hx Anemia & Hg improved to 11.6 today, Fe=51, Sat=16%, B12 is high;  she uses Hydroxyzine (Vistaril) 25mg  Prn for nerves...  ~  November 18, 2010:  52mo ROV & she seems more anxious & sl more agitated than prev, also weaker, more feeble (ie couldn't step up onto the exam table); she is concerned for her cardiac status> DrHochrein did Holter monitor w/ PACs/ PVCs & no dangerous  arrhythmias & no definite AFib seen; CDopplers ordered for ?CBruit but I cannot find the results in EPIC;  He increased her Metoprolol to 50mg Bid, and she increased her ASA to 81mg Bid as well... She notes that her HYDROXYZINE (Vistaril) helps more than anything & I mentioned it was OK to take this regularly;  See prob list below>>  ~  August 25, 2011:  24mo ROV & we reviewed her interval developments & she continues to f/u w/ DrRothbart regularly...    She had nonQ-MI 10/12 & cath showed severe 3 vessel CAD w/ PTCA/ stent placed in LAD (99% occluded) + med rx w/ Plavix added; there was AK anteriorly but LVF was preserved; DrRothbart did a Myoview scan 3/13 w/ SVT developing after the infusion w/ CP & abn STsegment changes; she spont reverted to NSR w/o need for intervention; Myoview images showed scar anteroseptal area + soft tissue attenuation & minor ischemia at the apex & EF=77%;  They incr her Metoprolol to 75mg Bid...    She saw DrHarrison in [place] 1/13 for a left knee injection. LABS 3/13:  FLP- at goals on Simva40 x TG=157;  Chems- ok x BS=126 A1c=6.0 on Metform;  CBC- Hg=11.3;  TSH=4.11;  BNP=309   Problem List:  Hx of ASTHMATIC BRONCHITIS, ACUTE (ICD-466.0) - uses Mucinex as needed... she has chr stable DOE w/o change... she say  she walks "some" but is obviously too sedentary due to her arthritis... she has PNEUMOVAX several yrs ago.  Hx of SARCOIDOSIS (ICD-135) - initially diagnosed in 1992 w/ CXR showing diffuse ILDis, +Gallium scan, ACE=61, & Bronch w/ bx showing non-caseating granulomas... treated w/ Pred & weaned off w/ no active disease since then... last CTChest 7/04 w/ ca++ Ao & coronaries, +mediastinal fat, centilobular emphysema, some scarring... stable without new symptoms of cough, sputum, CP or dyspnea... ~  CXR 1/09 showed stable chr lung changes & prom right hilum... ~  CXR 2/11 showed diffuse ILDz, no adenopathy, NAD.[name]. ~  CXR 2/12 showed borderline cardiomeg, tort Ao, sl  incr markings as before, NAD.[name]. ~  CXR 10/12 in hosp showed cardiomeg, calcif in Ao, vasc congestion w/ pulm edema & sm effusions...  HYPERTENSION (ICD-401.9) - controlled on meds: METOPROLOL 50mg - 1.5 tabsBid, AMLODIPINE 2.5mg /d, AVAPRO 150mg /d, LASIX 40mg /d... ~  Myoview 12/05 was neg- no ischemia, no infarct... ~  EKG 2/11 showed NSR, rate 82/min, WNL.[name]. ~  4/12:  DrHochrein increased her Metoprolol from 25Bid to 50Bid for her BP & ectopy... ~  6/12:  BP= 136/74 & denies HA, visual changes, CP, palipit, syncope, change in dyspnea, edema, etc... ~  3/13:  BP= 124/70 & she is feeling well, denies CP/ SOB/ ch in edema/ etc...  PALPITATIONS & Ventric Ectopy>  Eval & Rx by DrHochrein, his notes are reviewed... On METOPROLOL 50mg Bid & she uses HYDROXYZINE (Vistaril) 25mg  Prn which she says is helpful... ~  Event Monitor done 4/12 and no definite AFib encountered, just PVCs...   CORONARY ARTERY DISEASE >> on ASA 81mg /d & PLAVIX 75mg /d; plus the meds above & followed by DrRothbart... ~  She had nonQ-MI 10/12 & cath showed severe 3 vessel CAD w/ PTCA/ stent placed in LAD (99% occluded) + med rx w/ Plavix added; there was AK anteriorly but LVF was preserved... ~  EKG 11/12 showed NSR, rate85, PACs, septal infarct, otherw neg... ~  DrRothbart did a Myoview scan 3/13 w/ SVT developing after the infusion w/ CP & abn STsegment changes; she spont reverted to NSR w/o need for intervention; Myoview images showed scar anteroseptal area + soft tissue attenuation & minor ischemia at the apex & EF=77%; They incr her Metoprolol to 75mg Bid...  PERIPHERAL VASCULAR DISEASE (ICD-443.9) - on ASA 81mg /d, PLAVIX 75mg /d... doing well w/o claud symptoms... ~  Art Dopplers of LE's 6/06 showed biphasic waveforms in ant tib & peroneal arts, normal ABI's... ~  CDopplers 5/12 showed mild heterogeneous plaque bilat w/ 40-59% bilat ICA stenoses...  VENOUS INSUFFICIENCY (ICD-459.81) - on low sodium diet and the Lasix... she knows  to elevate legs, wear support hose, etc...  HYPERLIPIDEMIA (ICD-272.4) - on SIMVASTATIN 40mg /d... ~  FLP 1/08 showed TChol 121, TG 120, HDL 46, LDL 51... looks great- continue Simva40. ~  FLP 1/09 showed TChol 137, TG 165, HDL 37, LDL 67 ~  FLP 9/09 showed TChol 133, TG 141, HDL 47, LDL 57 ~  FLP 5/10 showed TChol 131, TG 190, HDL 46, LDL 47 ~  FLP 2/11 showed TChol 129, TG 129, HDL 52, LDL 51 ~  FLP 2/12 showed TChol 134, TG 159, HDL 49, LDL 53 ~  FLP 3/13 on Simva40 showed TChol 112, TG 157, HDL 46, LDL 35... Continue same + better low fat diet.  DM (ICD-250.00) - on diet + METFORMIN 500mg - 1Bid... unable to exercise or lose wt...  ~  labs 1/08 showed BS=130 and HgA1c=6.1 ~  labs 1/09  showed BS= 137, HgA1c= 6.2... diet and exercise are the keys!!! ~  labs 9/09 showed BS= 119, HgA1c= 6.3 ~  labs 5/10 showed BS= 134, A1c= 6.3 ~  labs 2/11 showed BS= 122, A1c= 6.3 ~  labs 6/11 showed BS= 106 ~  Labs 2/12 showed BS= 138, A1c= 6.3 ~  Labs 3/13 on Metform500Bid showed BS= 126, A1c= 6.0.Marland KitchenMarland Kitchen Continue same, get wt down!  MORBID OBESITY (ICD-278.01) - prev wt 227#  & 5\' 2"  tall for a BMI=41-42...  ~  weight 1/10 = 217# ~  weight 5/10 = 215# ~  weight 9/10 = 210# ~  weight 2/11 = 210# ~  weight 6/11 = 203# ~  weight 10/11 = 203# ~  Weight 6/12 = 195# ~  Weight 3/13 = 192#  GERD (ICD-530.81) - pt uses OTC Prilosec Prn... denies CP, dysphagia, reflux, N/ V, etc...  DIVERTICULOSIS, COLON (ICD-562.10) - had colonoscopy by DrDBrodie 1997 that was neg x divertics... now followed in [place]...  IRRITABLE BOWEL SYNDROME (ICD-564.1)  DEGENERATIVE JOINT DISEASE, ADVANCED (ICD-715.90) - she has mod severe bilat knee pain and sees DrHarrison, Ortho for shots "every three months"... she is opposed to surgery (see his EMR notes- reviewed)...  VITAMIN D DEFICIENCY (ICD-268.9) ~  labs 5/10 showed Vit D level =13... therefore rec Vit D 50000 u weekly. ~  labs 2/11 showed Vit D level = 32... OK  to change to 1000 u daily.. ~  BMD done 6/11 showed TScores -1.3 in Spine, and -2.5 in left FemNeck... rec> start Alendronate 70mg /wk but she refuses Bisphos Rx "I'll take mu=y chances"...  FIBROMYALGIA (ICD-729.1)  ANXIETY (ICD-300.00) - we perscribed HYDROXYZINE PAMOATE 75mg /d Prn for "throat closing" symptoms...  ANEMIA (ICD-285.9) - hx mild anemia w/ Hg = 10.8 Jan09 & started Fe daily at that time...  ~  labs 5/09 showed Hg= 11.7, Fe= 64 ~  labs 9/09 showed Hg= 11.4, Fe= 43... rec- take FeSO4 w/ Vit C 500mg /d. ~  labs 5/10 showed Hg= 11.3,  Fe= 50 ~  labs 2/11 showed Hg= 10.7, Fe= 36 (12%)... rec> incr Fe Vit C to Bid. ~  labs 6/11 showed Hg= 10.9, Fe= 58 (19%sat)... continue Bid Fe ~  Labs 2/12 showed Hg= 11.6, Fe= 51 (16%sat)... Continue same. ~  Labs 3/13 showed Hg= 11.3.Marland KitchenMarland Kitchen Continue FeSO4 Bid...  INTERMITTENT VERTIGO (ICD-780.4) - prev eval DrWeymann w/ Meclizine Prn...   Past Surgical History  Procedure Date  . Cholecystectomy   . Tonsillectomy   . Hernia repair   . Cataract extraction     Outpatient Encounter Prescriptions as of 08/25/2011  Medication Sig Dispense Refill  . acetaminophen (TYLENOL) 500 MG tablet Take 500 mg by mouth every 6 (six) hours as needed.        [name] amitriptyline (ELAVIL) 10 MG tablet TAKE 1 TABLET ONCE DAILY.  30 tablet  4  . amLODipine (NORVASC) 2.5 MG tablet Take 1 tablet (2.5 mg total) by mouth daily.  30 tablet  12  . Ascorbic Acid (VITAMIN C) 500 MG tablet Take 500 mg by mouth daily. Take one po bid with iron tablet      . aspirin EC 81 MG tablet Take 81 mg by mouth daily.        . AVAPRO 150 MG tablet TAKE (1) TABLET BY MOUTH ONCE DAILY.  30 each  6  . Cholecalciferol (VITAMIN D) 1000 UNITS capsule Take 1,000 Units by mouth daily. One po qd       . ferrous  sulfate 324 (65 FE) MG TBEC Take by mouth. One po bid       . furosemide (LASIX) 40 MG tablet Take 1 tablet (40 mg total) by mouth daily. One po qd  30 tablet  6  . glucose blood test strip  1 each by Other route as needed. Use as instructed       . metFORMIN (GLUCOPHAGE) 500 MG tablet Take 1 tablet (500 mg total) by mouth 2 (two) times daily.  60 tablet  5  . metoprolol (LOPRESSOR) 50 MG tablet Take  1 1/2 tablets (75 mg ) twice daily  90 tablet  12  . nitroGLYCERIN (NITROSTAT) 0.4 MG SL tablet Place 1 tablet (0.4 mg total) under the tongue every 5 (five) minutes as needed for chest pain.  90 tablet  3  . PLAVIX 75 MG tablet TAKE 1 TABLET ONCE DAILY.  30 each  6  . VISTARIL 25 MG capsule TAKE (1) CAPSULE EVERY SIX HOURS AS NEEDED.  50 each  5  . ZOCOR 40 MG tablet TAKE ONE TABLET BY MOUTH AT BEDTIME.  30 each  11  . Cyanocobalamin (VITAMIN B 12) 100 MCG LOZG Take by mouth.        . DISCONTD: meclizine (ANTIVERT) 25 MG tablet TAKE 1 TABLET BY MOUTH EVERY 4 HOURS AS NEEDED FOR  DIZZINESS.  50 tablet  5    Allergies  Allergen Reactions  . Codeine   . Diazepam     REACTION: causes pt unable to wake up after taking  . Lipitor (Atorvastatin Calcium)   . Penicillins   . Sulfonamide Derivatives     Review of Systems        See HPI - all other systems neg except as noted... The patient complains of decreased hearing, dyspnea on exertion, peripheral edema, muscle weakness, and difficulty walking.  The patient denies anorexia, fever, weight loss, weight gain, vision loss, hoarseness, chest pain, syncope, prolonged cough, headaches, hemoptysis, abdominal pain, melena, hematochezia, severe indigestion/heartburn, hematuria, incontinence, suspicious skin lesions, transient blindness, depression, unusual weight change, abnormal bleeding, enlarged lymph nodes, and angioedema.   Objective:   Physical Exam      WD, Obese, 78 y/o WF in NAD... GENERAL:  Alert & oriented; pleasant & cooperative... she is very talkative... HEENT:  [place]/AT, Glasses, EOM-full, PERRLA, TMs-wax, NOSE-clear, THROAT-clear & wnl. NECK:  Supple w/ fairROM; no JVD; no carotid bruits; no thyromegaly or nodules palpated;  no lymphadenopathy. CHEST:  decr BS bilat but clear w/o wheezing, rales or signs of consolidation... HEART:   sl irregular rhythm; without murmurs/ rubs/ or gallops detected... ABDOMEN:  Obese w/ panniculus, soft & nontender; normal bowel sounds; no organomegaly or masses palpated. EXT:  mod arthritic changes; no varicose veins/ +venous insuffic/ tr+edema bilat NEURO:  CN's intact, no focal neuro deficits... DERM:  few ecchymoses, no rash etc...  RADIOLOGY DATA:  Reviewed in the EPIC EMR & discussed w/ the patient...  LABORATORY DATA:  Reviewed in the EPIC EMR & discussed w/ the patient...   Assessment & Plan:   AB>  Breathing is stable w/o exac...  Hx Sarcoid>  CXR w/ chr changes, NAD...  HBP>  Controlled on meds, continue same, take regularly...  CAD>  Stable now on ASA + Plavix & her HBP meds, continue same & f/u drRothbart...  Palpit>  On going eval & med adjustments from DrHochrein; better on the ince Metoprolol but notes that the Vistaril really helps...  PVD>  On ASA & Plavix  w/o cerebral ischemic symptoms; due for CDoppler 5/13...  HYPERLIPID>  Stable on the Simva40...  DM>  Stable on the Metformin & wt is coming down some...  GI>  GERD, Divertics, IBS>  Stable, continue same Rx...  DJD>  Followed by DrHarrison in [place]...  Anxiety>  As noted the Hydroxyzine really helps.Marland KitchenMarland Kitchen

## 2011-09-07 ENCOUNTER — Ambulatory Visit (HOSPITAL_COMMUNITY)
Admission: RE | Admit: 2011-09-07 | Discharge: 2011-09-07 | Disposition: A | Discharge status: Home / Self Care | Payer: Medicare Other | Source: Ambulatory Visit | Attending: Adult Health | Admitting: Adult Health

## 2011-09-07 DIAGNOSIS — I6529 Occlusion and stenosis of unspecified carotid artery: Secondary | ICD-10-CM | POA: Insufficient documentation

## 2011-09-07 DIAGNOSIS — I10 Essential (primary) hypertension: Secondary | ICD-10-CM | POA: Insufficient documentation

## 2011-09-07 DIAGNOSIS — E119 Type 2 diabetes mellitus without complications: Secondary | ICD-10-CM | POA: Insufficient documentation

## 2011-09-14 ENCOUNTER — Encounter: Payer: Self-pay | Admitting: Adult Health

## 2011-09-14 ENCOUNTER — Ambulatory Visit (INDEPENDENT_AMBULATORY_CARE_PROVIDER_SITE_OTHER): Payer: Medicare Other | Admitting: Adult Health

## 2011-09-14 VITALS — BP 145/74 | HR 60 | Resp 16 | Ht 66.0 in | Wt 189.0 lb

## 2011-09-14 DIAGNOSIS — I679 Cerebrovascular disease, unspecified: Secondary | ICD-10-CM

## 2011-09-14 DIAGNOSIS — I471 Supraventricular tachycardia: Secondary | ICD-10-CM

## 2011-09-14 NOTE — Assessment & Plan Note (Signed)
Repeat of her carotid study is essentially unchanged from previous study. No significant stenosis with average between 50-69%.  Continue plavix.

## 2011-09-14 NOTE — Assessment & Plan Note (Signed)
She has continued complaints of occasional palpitations usually associated with anxiety. Vistaril helps more than anything else. I will continue her on the  Metoprolol 75 mg BID. She will follow-up with Dr.Rothart at that time. BP is tolerating higher dose of metoprolol without syncope or dizziness.

## 2011-09-14 NOTE — Patient Instructions (Signed)
**Note De-identified Shamone Winzer Obfuscation** Your physician recommends that you continue on your current medications as directed. Please refer to the Current Medication list given to you today.  Your physician recommends that you schedule a follow-up appointment in: 9 months  

## 2011-09-14 NOTE — Progress Notes (Signed)
Patient ID: Alexandra [name], female   DOB: 1923-07-12, 78 y.o.   MRN: [id number] HPI: Alexandra [name] is a 78 y/o patient of Dr. Dietrich Pates he follows for ongoing assessment and treatment of  Palpitations with known history of CAD.[name]  Event recording documented frequent PACs without significant arrhythmias.  She suffered a non-Q myocardial infarction in 03/2011 and was found to have severe three-vessel coronary disease.  A culprit subtotal lesion in the LAD was stented with medical therapy advised for moderate lesions in the circumflex and RCA.  LV systolic function was normal overall with akinesis in the distribution of the distal LAD.  She did well subsequently, but now is experiencing recurrent spells similar to those that 1st brought her to our attention.  She reports an empty feeling in her chest when she exerts herself that is relieved with rest.  She denies orthopnea, PND, palpitations, lightheadedness or syncope.  She has help at home with housework and other household activities.  She has not driven since her MI.    On last visit, Dr. Dietrich Pates scheduled her for stress myoview to evaluate her HR response to exercise and for ischemia. Tests were negative. On last visit I increased metoprolol to 75 mg BID from 50 mg BID. She continues to have occasional palpitations with exertion and anxiety. She has a Rx for vistaril which is helpful. She brings with her a record of her BP's.  Prior to Admission medications   Medication Sig Start Date End Date Taking? Authorizing Provider  acetaminophen (TYLENOL) 500 MG tablet Take 500 mg by mouth every 6 (six) hours as needed.     Yes Historical Provider, MD  amitriptyline (ELAVIL) 10 MG tablet Take 10 mg by mouth at bedtime. One po daily    Yes Historical Provider, MD  Ascorbic Acid (VITAMIN C) 500 MG tablet Take 500 mg by mouth daily. Take one po bid with iron tablet   Yes Historical Provider, MD  aspirin EC 81 MG tablet Take 81 mg by mouth daily.     Yes Historical  Provider, MD  AVAPRO 150 MG tablet TAKE (1) TABLET BY MOUTH ONCE DAILY. 03/03/11  Yes [name], MD  Cholecalciferol (VITAMIN D) 1000 UNITS capsule Take 1,000 Units by mouth daily. One po qd    Yes Historical Provider, MD  Cyanocobalamin (VITAMIN B 12) 100 MCG LOZG Take by mouth.     Yes Historical Provider, MD  ferrous sulfate 324 (65 FE) MG TBEC Take by mouth. One po bid    Yes Historical Provider, MD  furosemide (LASIX) 40 MG tablet Take 1 tablet (40 mg total) by mouth daily. One po qd 03/03/11  Yes [name], MD  glucose blood test strip 1 each by Other route as needed. Use as instructed    Yes Historical Provider, MD  meclizine (ANTIVERT) 25 MG tablet TAKE 1 TABLET BY MOUTH EVERY 4 HOURS AS NEEDED FOR  DIZZINESS. 10/28/10  Yes [name], MD  metFORMIN (GLUCOPHAGE) 500 MG tablet Take 1 tablet (500 mg total) by mouth 2 (two) times daily. 04/22/11  Yes [name], MD  metoprolol (LOPRESSOR) 50 MG tablet Take 1 tablet (50 mg total) by mouth 2 (two) times daily. 09/23/10 09/23/11 Yes Rollene Rotunda, MD  nitroGLYCERIN (NITROSTAT) 0.4 MG SL tablet Place 1 tablet (0.4 mg total) under the tongue every 5 (five) minutes as needed for chest pain. 04/02/11 04/01/12 Yes Gerrit Friends. Rothbart, MD  PLAVIX 75 MG tablet TAKE 1 TABLET ONCE  DAILY. 02/09/11  Yes [name], MD  VISTARIL 25 MG capsule TAKE (1) CAPSULE EVERY SIX HOURS AS NEEDED. 01/06/11  Yes [name], MD  ZOCOR 40 MG tablet TAKE ONE TABLET BY MOUTH AT BEDTIME. 02/26/11  Yes [name], MD  amLODipine (NORVASC) 2.5 MG tablet Take 1 tablet (2.5 mg total) by mouth daily. 07/21/11 07/20/12  Gerrit Friends. Dietrich Pates, MD    Allergies  Allergen Reactions  . Codeine   . Diazepam     REACTION: causes pt unable to wake up after taking  . Lipitor (Atorvastatin Calcium)   . Penicillins   . Sulfonamide Derivatives      ROS: Review of systems complete and found to be negative unless listed above  PHYSICAL EXAM: BP 145/74  Pulse 60  Resp 16  Ht  5\' 6"  (1.676 m)  Wt 189 lb (85.73 kg)  BMI 30.51 kg/m2  General-Well developed; no acute distress, very talkative Body habitus-overweight Neck-No JVD; left carotid bruits Lungs-clear lung fields; resonant to percussion Cardiovascular-normal PMI; normal S1 and S2; grade 2/6 systolic ejection murmur at the cardiac base; irregular pulse Abdomen-normal bowel sounds; soft and non-tender without masses or organomegaly Musculoskeletal-No deformities, no cyanosis or clubbing Neurologic-Normal cranial nerves; symmetric strength and tone Skin-Warm, no significant lesions Extremities-distal pulses intact; trace edema    ASSESSMENT AND PLAN:  Joni Reining NP 09/14/2011 1:21 PM

## 2011-09-23 ENCOUNTER — Encounter: Payer: Self-pay | Admitting: Orthopedic Surgery

## 2011-09-23 ENCOUNTER — Ambulatory Visit (INDEPENDENT_AMBULATORY_CARE_PROVIDER_SITE_OTHER): Payer: Medicare Other | Admitting: Orthopedic Surgery

## 2011-09-23 VITALS — BP 90/60 | Ht 66.0 in | Wt 190.0 lb

## 2011-09-23 DIAGNOSIS — IMO0002 Reserved for concepts with insufficient information to code with codable children: Secondary | ICD-10-CM

## 2011-09-23 DIAGNOSIS — M1710 Unilateral primary osteoarthritis, unspecified knee: Secondary | ICD-10-CM

## 2011-09-23 NOTE — Patient Instructions (Signed)
You have received a steroid shot. 15% of patients experience increased pain at the injection site with in the next 24 hours. This is best treated with ice and tylenol extra strength 2 tabs every 8 hours. If you are still having pain please call the office.    

## 2011-09-23 NOTE — Progress Notes (Signed)
Patient ID: Alexandra [name], female   DOB: 08/28/1923, 78 y.o.   MRN: [id number] Chief Complaint  Patient presents with  . Follow-up    3 month recheck on knees.    Knee  Injection Procedure Note  Pre-operative Diagnosis: left knee oa  Post-operative Diagnosis: same  Indications: pain  Anesthesia: ethyl chloride   Procedure Details   Verbal consent was obtained for the procedure. Time out was completed.The joint was prepped with alcohol, followed by  Ethyl chloride spray and A 20 gauge needle was inserted into the knee via lateral approach; 4ml 1% lidocaine and 1 ml of depomedrol  was then injected into the joint . The needle was removed and the area cleansed and dressed.  Complications:  None; patient tolerated the procedure well. Knee  Injection Procedure Note  Pre-operative Diagnosis: right knee oa  Post-operative Diagnosis: same  Indications: pain  Anesthesia: ethyl chloride   Procedure Details   Verbal consent was obtained for the procedure. Time out was completed.The joint was prepped with alcohol, followed by  Ethyl chloride spray and A 20 gauge needle was inserted into the knee via lateral approach; 4ml 1% lidocaine and 1 ml of depomedrol  was then injected into the joint . The needle was removed and the area cleansed and dressed.  Complications:  None; patient tolerated the procedure well.

## 2011-10-09 ENCOUNTER — Other Ambulatory Visit: Payer: Self-pay | Admitting: Pulmonary Disease

## 2011-10-15 ENCOUNTER — Telehealth: Payer: Self-pay | Admitting: Pulmonary Disease

## 2011-10-15 ENCOUNTER — Other Ambulatory Visit: Payer: Self-pay | Admitting: Pulmonary Disease

## 2011-10-15 MED ORDER — CLOPIDOGREL BISULFATE 75 MG PO TABS: 75.0000 mg | ORAL_TABLET | Freq: Every day | ORAL | Status: DC

## 2011-10-15 NOTE — Telephone Encounter (Signed)
Called and spoke with pt and she is aware of refills sent to the pharmacy 

## 2011-10-20 ENCOUNTER — Ambulatory Visit (INDEPENDENT_AMBULATORY_CARE_PROVIDER_SITE_OTHER): Payer: Medicare Other | Admitting: Adult Health

## 2011-10-20 ENCOUNTER — Encounter: Payer: Self-pay | Admitting: Adult Health

## 2011-10-20 VITALS — BP 117/60 | HR 60 | Resp 16 | Ht 66.0 in | Wt 182.0 lb

## 2011-10-20 DIAGNOSIS — I2510 Atherosclerotic heart disease of native coronary artery without angina pectoris: Secondary | ICD-10-CM

## 2011-10-20 DIAGNOSIS — I10 Essential (primary) hypertension: Secondary | ICD-10-CM

## 2011-10-20 DIAGNOSIS — K219 Gastro-esophageal reflux disease without esophagitis: Secondary | ICD-10-CM

## 2011-10-20 MED ORDER — PANTOPRAZOLE SODIUM 40 MG PO TBEC: 40.0000 mg | DELAYED_RELEASE_TABLET | Freq: Every day | ORAL | Status: DC

## 2011-10-20 NOTE — Assessment & Plan Note (Signed)
Well controlled at present. No changes in medication regimen. She is to see [place] again in 6 months. Reassurance is given.

## 2011-10-20 NOTE — Patient Instructions (Addendum)
**Note De-Identified Salisha Bardsley Obfuscation** Your physician has recommended you make the following change in your medication: start taking Protonix 40 mg daily  Your provider recommends that you see Dr. Karilyn Cota for your epigastric pain.  Your physician recommends that you schedule a follow-up appointment in:6 months

## 2011-10-20 NOTE — Progress Notes (Signed)
HPI: Alexandra [name] is a 78 y/o patient of Dr. Dietrich Pates he follows for ongoing assessment and treatment of Palpitations with known history of CAD.[name] Event recording documented frequent PACs without significant arrhythmias. She suffered a non-Q myocardial infarction in 03/2011 and was found to have severe three-vessel coronary disease. A culprit subtotal lesion in the LAD was stented with medical therapy advised for moderate lesions in the circumflex and RCA. Dr. Dietrich Pates scheduled her for stress myoview to evaluate her HR response to exercise and for ischemia. Tests were negative.    She was just seen one month ago but returns again for complaints of pain in her throat and frequent burping. She states she wakes up at night with this. When she gets up and  Walks around she feels better, belches and finds relief. She has not been seen by Dr.Rehman in several years with known history of GERD.    Allergies  Allergen Reactions  . Codeine   . Diazepam     REACTION: causes pt unable to wake up after taking  . Lipitor (Atorvastatin Calcium)   . Penicillins   . Sulfonamide Derivatives     Current Outpatient Prescriptions  Medication Sig Dispense Refill  . acetaminophen (TYLENOL) 500 MG tablet Take 500 mg by mouth every 6 (six) hours as needed.        [name] amitriptyline (ELAVIL) 10 MG tablet TAKE 1 TABLET ONCE DAILY.  30 tablet  4  . amLODipine (NORVASC) 2.5 MG tablet Take 1 tablet (2.5 mg total) by mouth daily.  30 tablet  12  . Ascorbic Acid (VITAMIN C) 500 MG tablet Take 500 mg by mouth daily. Take one po bid with iron tablet      . aspirin EC 81 MG tablet Take 81 mg by mouth daily.        . AVAPRO 150 MG tablet TAKE (1) TABLET BY MOUTH ONCE DAILY.  30 each  6  . Cholecalciferol (VITAMIN D) 1000 UNITS capsule Take 1,000 Units by mouth daily. One po qd       . clopidogrel (PLAVIX) 75 MG tablet Take 1 tablet (75 mg total) by mouth daily.  30 tablet  11  . Cyanocobalamin (VITAMIN B 12) 100 MCG LOZG Take  by mouth.        [name] econazole nitrate 1 % cream       . ferrous sulfate 324 (65 FE) MG TBEC Take by mouth. One po bid       . furosemide (LASIX) 40 MG tablet Take 1 tablet (40 mg total) by mouth daily. One po qd  30 tablet  6  . glucose blood test strip 1 each by Other route as needed. Use as instructed       . metFORMIN (GLUCOPHAGE) 500 MG tablet Take 1 tablet (500 mg total) by mouth 2 (two) times daily.  60 tablet  5  . nitroGLYCERIN (NITROSTAT) 0.4 MG SL tablet Place 1 tablet (0.4 mg total) under the tongue every 5 (five) minutes as needed for chest pain.  90 tablet  3  . VISTARIL 25 MG capsule TAKE (1) CAPSULE EVERY SIX HOURS AS NEEDED.  50 each  5  . ZOCOR 40 MG tablet TAKE ONE TABLET BY MOUTH AT BEDTIME.  30 each  11  . metoprolol (LOPRESSOR) 50 MG tablet Take  1 1/2 tablets (75 mg ) twice daily  90 tablet  12  . pantoprazole (PROTONIX) 40 MG tablet Take 1 tablet (40 mg total) by mouth  daily.  30 tablet  6    Past Medical History  Diagnosis Date  . Asthmatic bronchitis   . Sarcoidosis   . HTN (hypertension)   . PVD (peripheral vascular disease)   . Arteriosclerotic cardiovascular disease (ASCVD) 03/2011    07/2010-evaluation by Dr. Antoine Poche for palpitations; 03/2011-non-ST elevation MI complicated by pulmonary edema->DES to subtotal LAD;  residual 50-70% mid circumflex, 50% mid and 70-80% distal RCA, heavily calcified vessels, no LV injection; Echo-akinesis of the distal anteroseptal and apical myocardium with EF of 55%  . Hyperlipemia   . DM (diabetes mellitus)   . Morbid obesity   . GERD (gastroesophageal reflux disease)   . Diverticulosis of colon   . IBS (irritable bowel syndrome)   . DJD (degenerative joint disease)     Hip and knee pain; hip bursitis; patellar subluxation  . Vitamin D deficiency disease   . Fibromyalgia   . Anxiety   . Intermittent vertigo   . Anemia   . Venous insufficiency     Past Surgical History  Procedure Date  . Cholecystectomy   .  Tonsillectomy   . Hernia repair   . Cataract extraction     AVW:UJWJXB of systems complete and found to be negative unless listed above  PHYSICAL EXAM BP 117/60  Pulse 60  Resp 16  Ht 5\' 6"  (1.676 m)  Wt 182 lb (82.555 kg)  BMI 29.38 kg/m2  General: Well developed, well nourished, in no acute distress, anxious. Head: Eyes PERRLA, No xanthomas.   Normal cephalic and atramatic  Lungs: Clear bilaterally to auscultation and percussion. Heart: HRRR S1 S2, without MRG.  Pulses are 2+ & equal.            No carotid bruit. No JVD.  No abdominal bruits. No femoral bruits. Abdomen: Bowel sounds are positive, abdomen soft and non-tender without masses or                  Hernia's noted. Msk:  Back normal, normal gait. Normal strength and tone for age. Extremities: No clubbing, cyanosis or edema.  DP +1 Neuro: Alert and oriented X 3. Psych:  Good affect, responds appropriately    ASSESSMENT AND PLAN

## 2011-10-20 NOTE — Assessment & Plan Note (Signed)
I do not believe her symptoms are related to cardiac disease or that she is experiencing angina symptoms. I have reviewed most recent stress test and found it to be normal. She given a Rx for protonix and referred to GI for evaluation of this.

## 2011-11-09 ENCOUNTER — Other Ambulatory Visit: Payer: Self-pay | Admitting: Pulmonary Disease

## 2011-11-10 ENCOUNTER — Telehealth: Payer: Self-pay | Admitting: Pulmonary Disease

## 2011-11-10 MED ORDER — FUROSEMIDE 40 MG PO TABS: 40.0000 mg | ORAL_TABLET | Freq: Every day | ORAL | Status: DC

## 2011-11-10 NOTE — Telephone Encounter (Signed)
Rx refill was sent. Spoke with pt and notified that this was done. She verbalized understanding and states nothing further needed.

## 2011-11-16 ENCOUNTER — Other Ambulatory Visit: Payer: Self-pay | Admitting: Pulmonary Disease

## 2011-11-19 ENCOUNTER — Other Ambulatory Visit: Payer: Self-pay | Admitting: Pulmonary Disease

## 2011-11-19 MED ORDER — METFORMIN HCL 500 MG PO TABS: 500.0000 mg | ORAL_TABLET | Freq: Two times a day (BID) | ORAL | Status: DC

## 2011-11-30 ENCOUNTER — Inpatient Hospital Stay (HOSPITAL_COMMUNITY)
Admission: EM | Admit: 2011-11-30 | Discharge: 2011-12-04 | DRG: 251 | Disposition: A | Discharge status: Home / Self Care | Payer: Medicare Other | Attending: Cardiology | Admitting: Cardiology

## 2011-11-30 ENCOUNTER — Emergency Department (HOSPITAL_COMMUNITY): Payer: Medicare Other

## 2011-11-30 ENCOUNTER — Encounter (HOSPITAL_COMMUNITY): Payer: Self-pay | Admitting: Emergency Medicine

## 2011-11-30 DIAGNOSIS — I7090 Unspecified atherosclerosis: Secondary | ICD-10-CM

## 2011-11-30 DIAGNOSIS — E119 Type 2 diabetes mellitus without complications: Secondary | ICD-10-CM | POA: Diagnosis present

## 2011-11-30 DIAGNOSIS — I872 Venous insufficiency (chronic) (peripheral): Secondary | ICD-10-CM | POA: Diagnosis present

## 2011-11-30 DIAGNOSIS — K589 Irritable bowel syndrome without diarrhea: Secondary | ICD-10-CM | POA: Diagnosis present

## 2011-11-30 DIAGNOSIS — R079 Chest pain, unspecified: Secondary | ICD-10-CM

## 2011-11-30 DIAGNOSIS — D869 Sarcoidosis, unspecified: Secondary | ICD-10-CM | POA: Diagnosis present

## 2011-11-30 DIAGNOSIS — K5730 Diverticulosis of large intestine without perforation or abscess without bleeding: Secondary | ICD-10-CM | POA: Diagnosis present

## 2011-11-30 DIAGNOSIS — K219 Gastro-esophageal reflux disease without esophagitis: Secondary | ICD-10-CM | POA: Diagnosis present

## 2011-11-30 DIAGNOSIS — D649 Anemia, unspecified: Secondary | ICD-10-CM | POA: Diagnosis present

## 2011-11-30 DIAGNOSIS — M1990 Unspecified osteoarthritis, unspecified site: Secondary | ICD-10-CM | POA: Diagnosis present

## 2011-11-30 DIAGNOSIS — E559 Vitamin D deficiency, unspecified: Secondary | ICD-10-CM | POA: Diagnosis present

## 2011-11-30 DIAGNOSIS — I471 Supraventricular tachycardia: Secondary | ICD-10-CM

## 2011-11-30 DIAGNOSIS — F411 Generalized anxiety disorder: Secondary | ICD-10-CM | POA: Diagnosis present

## 2011-11-30 DIAGNOSIS — I739 Peripheral vascular disease, unspecified: Secondary | ICD-10-CM | POA: Diagnosis present

## 2011-11-30 DIAGNOSIS — I679 Cerebrovascular disease, unspecified: Secondary | ICD-10-CM | POA: Diagnosis present

## 2011-11-30 DIAGNOSIS — IMO0001 Reserved for inherently not codable concepts without codable children: Secondary | ICD-10-CM | POA: Diagnosis present

## 2011-11-30 DIAGNOSIS — I10 Essential (primary) hypertension: Secondary | ICD-10-CM | POA: Diagnosis present

## 2011-11-30 DIAGNOSIS — F419 Anxiety disorder, unspecified: Secondary | ICD-10-CM | POA: Diagnosis present

## 2011-11-30 DIAGNOSIS — I200 Unstable angina: Secondary | ICD-10-CM

## 2011-11-30 DIAGNOSIS — Y849 Medical procedure, unspecified as the cause of abnormal reaction of the patient, or of later complication, without mention of misadventure at the time of the procedure: Secondary | ICD-10-CM | POA: Diagnosis present

## 2011-11-30 DIAGNOSIS — Z9861 Coronary angioplasty status: Secondary | ICD-10-CM

## 2011-11-30 DIAGNOSIS — T82897A Other specified complication of cardiac prosthetic devices, implants and grafts, initial encounter: Principal | ICD-10-CM | POA: Diagnosis present

## 2011-11-30 DIAGNOSIS — E669 Obesity, unspecified: Secondary | ICD-10-CM | POA: Diagnosis present

## 2011-11-30 DIAGNOSIS — E785 Hyperlipidemia, unspecified: Secondary | ICD-10-CM | POA: Diagnosis present

## 2011-11-30 DIAGNOSIS — I2510 Atherosclerotic heart disease of native coronary artery without angina pectoris: Secondary | ICD-10-CM | POA: Diagnosis present

## 2011-11-30 DIAGNOSIS — I252 Old myocardial infarction: Secondary | ICD-10-CM

## 2011-11-30 DIAGNOSIS — I498 Other specified cardiac arrhythmias: Secondary | ICD-10-CM | POA: Diagnosis present

## 2011-11-30 HISTORY — DX: Supraventricular tachycardia, unspecified: I47.10

## 2011-11-30 HISTORY — DX: Obesity, unspecified: E66.9

## 2011-11-30 HISTORY — DX: Atherosclerotic heart disease of native coronary artery without angina pectoris: I25.10

## 2011-11-30 HISTORY — DX: Supraventricular tachycardia: I47.1

## 2011-11-30 LAB — CBC WITH DIFFERENTIAL/PLATELET
Basophils Absolute: 0 10*3/uL (ref 0.0–0.1)
Eosinophils Relative: 3 % (ref 0–5)
Lymphocytes Relative: 12 % (ref 12–46)
Lymphs Abs: 0.9 10*3/uL (ref 0.7–4.0)
MCV: 88.2 fL (ref 78.0–100.0)
Neutro Abs: 5.9 10*3/uL (ref 1.7–7.7)
Neutrophils Relative %: 81 % — ABNORMAL HIGH (ref 43–77)
Platelets: 162 10*3/uL (ref 150–400)
RBC: 3.57 MIL/uL — ABNORMAL LOW (ref 3.87–5.11)
RDW: 13.5 % (ref 11.5–15.5)
WBC: 7.3 10*3/uL (ref 4.0–10.5)

## 2011-11-30 LAB — COMPREHENSIVE METABOLIC PANEL
Albumin: 3.9 g/dL (ref 3.5–5.2)
Alkaline Phosphatase: 67 U/L (ref 39–117)
BUN: 22 mg/dL (ref 6–23)
CO2: 26 mEq/L (ref 19–32)
Chloride: 93 mEq/L — ABNORMAL LOW (ref 96–112)
GFR calc non Af Amer: 44 mL/min — ABNORMAL LOW (ref >90)
Potassium: 4.3 mEq/L (ref 3.5–5.1)
Total Bilirubin: 0.4 mg/dL (ref 0.3–1.2)

## 2011-11-30 LAB — CARDIAC PANEL(CRET KIN+CKTOT+MB+TROPI)
CK, MB: 2.3 ng/mL (ref 0.3–4.0)
CK, MB: 2.1 ng/mL (ref 0.3–4.0)
Relative Index: INVALID (ref 0.0–2.5)
Relative Index: INVALID (ref 0.0–2.5)
Total CK: 51 U/L (ref 7–177)
Total CK: 54 U/L (ref 7–177)
Total CK: 43 U/L (ref 7–177)

## 2011-11-30 MED ORDER — SODIUM CHLORIDE 0.9 % IJ SOLN
INTRAMUSCULAR | Status: AC
  Administered 2011-11-30: 10 mL
  Filled 2011-11-30: qty 3

## 2011-11-30 MED ORDER — ASPIRIN 81 MG PO CHEW
324.0000 mg | CHEWABLE_TABLET | ORAL | Status: AC
  Administered 2011-12-01: 324 mg via ORAL
  Filled 2011-11-30: qty 1
  Filled 2011-11-30: qty 4

## 2011-11-30 MED ORDER — SODIUM CHLORIDE 0.9 % IJ SOLN
3.0000 mL | Freq: Two times a day (BID) | INTRAMUSCULAR | Status: DC
  Administered 2011-11-30 – 2011-12-04 (×7): 3 mL via INTRAVENOUS
  Filled 2011-11-30: qty 3

## 2011-11-30 MED ORDER — VITAMIN B-12 1000 MCG PO TABS
1000.0000 ug | ORAL_TABLET | Freq: Every morning | ORAL | Status: DC
  Administered 2011-11-30 – 2011-12-04 (×4): 1000 ug via ORAL
  Filled 2011-11-30 (×4): qty 1

## 2011-11-30 MED ORDER — ASPIRIN EC 81 MG PO TBEC
81.0000 mg | DELAYED_RELEASE_TABLET | Freq: Every morning | ORAL | Status: DC
  Administered 2011-12-02 – 2011-12-04 (×3): 81 mg via ORAL
  Filled 2011-11-30 (×4): qty 1

## 2011-11-30 MED ORDER — METOPROLOL TARTRATE 50 MG PO TABS
75.0000 mg | ORAL_TABLET | Freq: Two times a day (BID) | ORAL | Status: DC
  Administered 2011-11-30 – 2011-12-04 (×8): 75 mg via ORAL
  Filled 2011-11-30 (×10): qty 1

## 2011-11-30 MED ORDER — IRBESARTAN 150 MG PO TABS
150.0000 mg | ORAL_TABLET | Freq: Every day | ORAL | Status: DC
  Administered 2011-12-01 – 2011-12-04 (×3): 150 mg via ORAL
  Filled 2011-11-30 (×4): qty 1

## 2011-11-30 MED ORDER — HYDROXYZINE HCL 25 MG PO TABS
25.0000 mg | ORAL_TABLET | Freq: Four times a day (QID) | ORAL | Status: DC | PRN
  Filled 2011-11-30: qty 1

## 2011-11-30 MED ORDER — ONDANSETRON HCL 4 MG/2ML IJ SOLN
4.0000 mg | Freq: Four times a day (QID) | INTRAMUSCULAR | Status: DC | PRN
  Administered 2011-12-02: 4 mg via INTRAVENOUS
  Filled 2011-11-30: qty 2

## 2011-11-30 MED ORDER — SODIUM CHLORIDE 0.9 % IJ SOLN
3.0000 mL | Freq: Two times a day (BID) | INTRAMUSCULAR | Status: DC
  Filled 2011-11-30: qty 3

## 2011-11-30 MED ORDER — ONDANSETRON HCL 4 MG PO TABS: 4.0000 mg | ORAL_TABLET | Freq: Four times a day (QID) | ORAL | Status: DC | PRN

## 2011-11-30 MED ORDER — MORPHINE SULFATE 2 MG/ML IJ SOLN
1.0000 mg | INTRAMUSCULAR | Status: DC | PRN
  Administered 2011-11-30: 1 mg via INTRAVENOUS
  Filled 2011-11-30: qty 1

## 2011-11-30 MED ORDER — ALUM & MAG HYDROXIDE-SIMETH 200-200-20 MG/5ML PO SUSP
30.0000 mL | Freq: Four times a day (QID) | ORAL | Status: DC | PRN
  Filled 2011-11-30: qty 30

## 2011-11-30 MED ORDER — SIMVASTATIN 40 MG PO TABS
40.0000 mg | ORAL_TABLET | Freq: Every day | ORAL | Status: DC
  Administered 2011-11-30 – 2011-12-03 (×4): 40 mg via ORAL
  Filled 2011-11-30 (×5): qty 1
  Filled 2011-11-30: qty 2

## 2011-11-30 MED ORDER — METFORMIN HCL 500 MG PO TABS
500.0000 mg | ORAL_TABLET | Freq: Two times a day (BID) | ORAL | Status: DC
  Administered 2011-11-30: 500 mg via ORAL
  Filled 2011-11-30 (×3): qty 1

## 2011-11-30 MED ORDER — PANTOPRAZOLE SODIUM 40 MG PO TBEC
40.0000 mg | DELAYED_RELEASE_TABLET | Freq: Two times a day (BID) | ORAL | Status: DC
  Administered 2011-11-30 – 2011-12-04 (×7): 40 mg via ORAL
  Filled 2011-11-30 (×6): qty 1

## 2011-11-30 MED ORDER — MAGNESIUM HYDROXIDE 400 MG/5ML PO SUSP
30.0000 mL | Freq: Every day | ORAL | Status: DC | PRN
  Administered 2011-12-03: 30 mL via ORAL
  Filled 2011-11-30: qty 30

## 2011-11-30 MED ORDER — NITROGLYCERIN 0.4 MG SL SUBL: 0.4000 mg | SUBLINGUAL_TABLET | SUBLINGUAL | Status: DC | PRN

## 2011-11-30 MED ORDER — AMITRIPTYLINE HCL 10 MG PO TABS
10.0000 mg | ORAL_TABLET | Freq: Every evening | ORAL | Status: DC | PRN
  Administered 2011-11-30: 10 mg via ORAL
  Filled 2011-11-30 (×2): qty 1

## 2011-11-30 MED ORDER — AMLODIPINE BESYLATE 2.5 MG PO TABS
2.5000 mg | ORAL_TABLET | Freq: Every day | ORAL | Status: DC
  Administered 2011-11-30 – 2011-12-03 (×4): 2.5 mg via ORAL
  Filled 2011-11-30 (×6): qty 1

## 2011-11-30 MED ORDER — SODIUM CHLORIDE 0.9 % IV SOLN: 250.0000 mL | INTRAVENOUS | Status: DC | PRN

## 2011-11-30 MED ORDER — GI COCKTAIL ~~LOC~~
30.0000 mL | Freq: Once | ORAL | Status: AC
  Administered 2011-11-30: 30 mL via ORAL
  Filled 2011-11-30: qty 30

## 2011-11-30 MED ORDER — ENOXAPARIN SODIUM 40 MG/0.4ML ~~LOC~~ SOLN
40.0000 mg | SUBCUTANEOUS | Status: DC
  Administered 2011-11-30: 40 mg via SUBCUTANEOUS
  Filled 2011-11-30: qty 0.4

## 2011-11-30 MED ORDER — FUROSEMIDE 40 MG PO TABS
40.0000 mg | ORAL_TABLET | Freq: Every day | ORAL | Status: DC
  Administered 2011-11-30 – 2011-12-04 (×4): 40 mg via ORAL
  Filled 2011-11-30 (×4): qty 1

## 2011-11-30 MED ORDER — FERROUS SULFATE 325 (65 FE) MG PO TABS
325.0000 mg | ORAL_TABLET | Freq: Two times a day (BID) | ORAL | Status: DC
  Administered 2011-11-30 – 2011-12-04 (×8): 325 mg via ORAL
  Filled 2011-11-30 (×10): qty 1

## 2011-11-30 MED ORDER — SODIUM CHLORIDE 0.9 % IJ SOLN: 3.0000 mL | INTRAMUSCULAR | Status: DC | PRN

## 2011-11-30 MED ORDER — ACETAMINOPHEN 500 MG PO TABS
500.0000 mg | ORAL_TABLET | Freq: Four times a day (QID) | ORAL | Status: DC | PRN
  Administered 2011-12-04: 500 mg via ORAL
  Filled 2011-11-30 (×2): qty 1

## 2011-11-30 MED ORDER — CLOPIDOGREL BISULFATE 75 MG PO TABS
75.0000 mg | ORAL_TABLET | Freq: Every day | ORAL | Status: DC
  Administered 2011-11-30 – 2011-12-04 (×4): 75 mg via ORAL
  Filled 2011-11-30 (×6): qty 1

## 2011-11-30 MED ORDER — NITROGLYCERIN 2 % TD OINT
1.0000 [in_us] | TOPICAL_OINTMENT | Freq: Four times a day (QID) | TRANSDERMAL | Status: DC
  Administered 2011-11-30 – 2011-12-01 (×4): 1 [in_us] via TOPICAL
  Filled 2011-11-30 (×4): qty 1

## 2011-11-30 MED ORDER — HYDROXYZINE PAMOATE 25 MG PO CAPS
25.0000 mg | ORAL_CAPSULE | Freq: Four times a day (QID) | ORAL | Status: DC | PRN
  Filled 2011-11-30: qty 1

## 2011-11-30 MED ORDER — SODIUM CHLORIDE 0.9 % IV SOLN
1.0000 mL/kg/h | INTRAVENOUS | Status: DC
  Administered 2011-12-01: 1 mL/kg/h via INTRAVENOUS

## 2011-11-30 MED ORDER — VITAMIN C 500 MG PO TABS
500.0000 mg | ORAL_TABLET | Freq: Two times a day (BID) | ORAL | Status: DC
  Administered 2011-11-30 – 2011-12-04 (×8): 500 mg via ORAL
  Filled 2011-11-30 (×10): qty 1

## 2011-11-30 NOTE — ED Notes (Addendum)
Generalized cp radiating down both arms with sob/n since 0300 this am. Pt states she took 11 nitroglycerin sl since 0300 this am. Pt educated on proper use of nitroglycerin. Pt states she also took 162mg  asa pta.

## 2011-11-30 NOTE — H&P (Addendum)
Hospital Admission Note Date: 11/30/2011  Patient name: Alexandra [name] Medical record number: [id number] Date of birth: May 08, 1924 Age: 78 y.o. Gender: female PCP: [name], MD  Attending physician: Christiane Ha, MD  Chief Complaint: chest pain  History of Present Illness:  Alexandra [name] is an 78 y.o. female with heart disease who presents with chest pressure. It radiated to her neck and left arm. It occurred with exertion. She felt like she was having a heart attack. She also has a history of gastroesophageal reflux disease and was given a GI cocktail in the emergency room. She has no discomfort currently. She took 11 nitroglycerin sublingually over the course of the morning. She has a history of non-ST segment elevation MI in March of 2012. She was found to have severe three-vessel coronary disease and the LAD was stented, with medical therapy advise for moderate lesions in the circumflex and RCA.  Last Myoview in February was abnormal. She had SVT after infusion of [place] scan. She developed chest pain and ST segment changes. Perfusion images showed perhaps a combination of soft tissue attenuation and scar with minor ischemia at the apex. She was seen by [facility] cardiology in April for palpitations and chest pain. Her discomfort at that time was felt to be not cardiac but she was referred to Dr. Dionicia Abler for GI workup. Patient has not yet set up an appointment. She is on protonix. She denies having had upper endoscopy in the past.  Past Medical History  Diagnosis Date  . Asthmatic bronchitis   . Sarcoidosis   . HTN (hypertension)   . PVD (peripheral vascular disease)   . Arteriosclerotic cardiovascular disease (ASCVD) 03/2011    07/2010-evaluation by Dr. Antoine Poche for palpitations; 03/2011-non-ST elevation MI complicated by pulmonary edema->DES to subtotal LAD;  residual 50-70% mid circumflex, 50% mid and 70-80% distal RCA, heavily calcified vessels, no LV injection; Echo-akinesis of the  distal anteroseptal and apical myocardium with EF of 55%  . Hyperlipemia   . DM (diabetes mellitus)   . Morbid obesity   . GERD (gastroesophageal reflux disease)   . Diverticulosis of colon   . IBS (irritable bowel syndrome)   . DJD (degenerative joint disease)     Hip and knee pain; hip bursitis; patellar subluxation  . Vitamin D deficiency disease   . Fibromyalgia   . Anxiety   . Intermittent vertigo   . Anemia   . Venous insufficiency   . Coronary artery disease     Meds: Prescriptions prior to admission  Medication Sig Dispense Refill  . acetaminophen (TYLENOL) 500 MG tablet Take 500 mg by mouth every 6 (six) hours as needed.        [name] amitriptyline (ELAVIL) 10 MG tablet       . amLODipine (NORVASC) 2.5 MG tablet Take 2.5 mg by mouth at bedtime.      . Ascorbic Acid (VITAMIN C) 500 MG tablet Take 500 mg by mouth 2 (two) times daily. Take one po bid with iron tablet      . aspirin EC 81 MG tablet Take 81 mg by mouth every morning.       . Cholecalciferol (VITAMIN D) 1000 UNITS capsule Take 1,000 Units by mouth every morning. One po qd      . clopidogrel (PLAVIX) 75 MG tablet Take 75 mg by mouth every morning.      [name] econazole nitrate 1 % cream Apply 1 application topically daily as needed. FOR FEET IRRITATION      .  ferrous sulfate 324 (65 FE) MG TBEC Take 1 tablet by mouth 2 (two) times daily.       . furosemide (LASIX) 40 MG tablet Take 40 mg by mouth daily.      . hydrOXYzine (VISTARIL) 25 MG capsule       . irbesartan (AVAPRO) 150 MG tablet       . metFORMIN (GLUCOPHAGE) 500 MG tablet Take 1 tablet (500 mg total) by mouth 2 (two) times daily.  60 tablet  6  . metoprolol (LOPRESSOR) 50 MG tablet Take 75 mg by mouth 2 (two) times daily. Take  1 1/2 tablets (75 mg ) twice daily      . Neomycin-Bacitracin-Polymyxin (LANABIOTIC) 5-500-10000 OINT Apply 1 application topically 2 (two) times daily as needed.      . nitroGLYCERIN (NITROSTAT) 0.4 MG SL tablet Place 0.4 mg under the  tongue every 5 (five) minutes x 3 doses as needed.      . pantoprazole (PROTONIX) 40 MG tablet Take 40 mg by mouth every morning.      . vitamin B-12 (CYANOCOBALAMIN) 1000 MCG tablet Take 1,000 mcg by mouth every morning.      [name] ZOCOR 40 MG tablet TAKE ONE TABLET BY MOUTH AT BEDTIME.  30 each  11  . glucose blood test strip 1 each by Other route as needed. Use as instructed         Allergies: Codeine; Diazepam; Lipitor; Penicillins; and Sulfonamide derivatives History   Social History  . Marital Status: Widowed    Spouse Name: N/A    Number of Children: 1  . Years of Education: N/A   Occupational History  . Not on file.   Social History Main Topics  . Smoking status: Never Smoker   . Smokeless tobacco: Never Used  . Alcohol Use: Yes     social  . Drug Use: No  . Sexually Active: Not on file   Other Topics Concern  . Not on file   Social History Narrative   Pt is only child. Husband Duanne Guess passed away in 2004Pt has 1 son in [place](his wife is MD doing [employer] )    Family History  Problem Relation Age of Onset  . Heart failure Father   . Leukemia Mother    Past Surgical History  Procedure Date  . Cholecystectomy   . Tonsillectomy   . Hernia repair   . Cataract extraction     Review of Systems: Systems reviewed and as per HPI, otherwise negative.  Physical Exam: Blood pressure 153/66, pulse 83, temperature 97.6 F (36.4 C), resp. rate 20, height 5\' 6"  (1.676 m), weight 85.5 kg (188 lb 7.9 oz), SpO2 96.00%. BP 153/66  Pulse 83  Temp 97.6 F (36.4 C)  Resp 20  Ht 5\' 6"  (1.676 m)  Wt 85.5 kg (188 lb 7.9 oz)  BMI 30.42 kg/m2  SpO2 96%  General Appearance:    Alert, cooperative, no distress, appears stated age  Head:    Normocephalic, without obvious abnormality, atraumatic  Eyes:    PERRL, conjunctiva/corneas clear, EOM's intact, fundi    benign, both eyes  Ears:    Normal TM's and external ear canals, both ears  Nose:   Nares normal, septum midline,  mucosa normal, no drainage    or sinus tenderness  Throat:   Lips, mucosa, and tongue normal; teeth and gums normal  Neck:   Supple, symmetrical, trachea midline, no adenopathy;    thyroid:  no enlargement/tenderness/nodules; no carotid  bruit or JVD  Back:     Symmetric, no curvature, ROM normal, no CVA tenderness  Lungs:     Clear to auscultation bilaterally, respirations unlabored  Chest Wall:    No tenderness or deformity   Heart:    Regular rate and rhythm, S1 and S2 normal, no murmur, rub   or gallop     Abdomen:     Soft, non-tender, bowel sounds active all four quadrants,    no masses, no organomegaly  Genitalia:  deferred  Rectal:  deferred  Extremities:   Extremities normal, atraumatic, no cyanosis or edema  Pulses:   2+ and symmetric all extremities  Skin:   Skin color, texture, turgor normal, no rashes or lesions  Lymph nodes:   Cervical, supraclavicular, and axillary nodes normal  Neurologic:   CNII-XII intact, normal strength, sensation and reflexes    throughout    Psychiatric:  Normal affect  Lab results: Basic Metabolic Panel:  Basename 11/30/11 0811  NA 134*  K 4.3  CL 93*  CO2 26  GLUCOSE 177*  BUN 22  CREATININE 1.10  CALCIUM 8.8  MG --  PHOS --   Liver Function Tests:  Basename 11/30/11 0811  AST 18  ALT 8  ALKPHOS 67  BILITOT 0.4  PROT 7.3  ALBUMIN 3.9   No results found for this basename: LIPASE:2,AMYLASE:2 in the last 72 hours No results found for this basename: AMMONIA:2 in the last 72 hours CBC:  Basename 11/30/11 0811  WBC 7.3  NEUTROABS 5.9  HGB 10.5*  HCT 31.5*  MCV 88.2  PLT 162   Cardiac Enzymes:  Basename 11/30/11 0811  CKTOTAL 51  CKMB 2.3  CKMBINDEX --  TROPONINI <0.30   EKG shows irregular rhythm. Possibly sinus with premature complexes. Computer has read rhythm as atrial fibrillation.  Imaging results:  Dg Chest Portable 1 View  11/30/2011  *RADIOLOGY REPORT*  Clinical Data: Chest pain  PORTABLE CHEST - 1 VIEW   Comparison: 03/25/2011 and 03/23/2011  Findings: Cardiomediastinal silhouette is stable.  Central mild vascular congestion without pulmonary edema.  Mitral valve calcifications are again noted. No focal infiltrate.  IMPRESSION: Central mild vascular congestion without convincing pulmonary edema.  No focal infiltrate.  Cardiomegaly again noted.  Original Report Authenticated By: Natasha Mead, M.D.    Assessment & Plan: Active Problems: Chest pain  GERD  Arteriosclerotic cardiovascular disease (ASCVD)  Sarcoidosis  DM  ANEMIA  HYPERTENSION  VITAMIN D DEFICIENCY  HYPERLIPIDEMIA  Irritable bowel syndrome  FIBROMYALGIA  Cerebrovascular disease  DJD (degenerative joint disease)  Anxiety  Patient will be observed on telemetry. Rule out MI. Consult cardiology. See orders Continue outpatient medications.  Anthonyjames Bargar L 11/30/2011, 1:24 PM

## 2011-11-30 NOTE — ED Notes (Signed)
Transported to floor via stretcher with portable monitor and 02 at 2L/min via [place].

## 2011-11-30 NOTE — ED Notes (Signed)
Pt showing A fib on cardiac monitor. C/o throbbing pain in her throat and states that it is acid reflux. Pt denies chest pain or shortness of breath at this time.

## 2011-11-30 NOTE — ED Provider Notes (Signed)
History   This chart was scribed for Geoffery Lyons, MD by Sofie Rower. The patient was seen in room APA06/APA06 and the patient's care was started at 7:48 AM     CSN: [id number]  Arrival date & time 11/30/11  6213   First MD Initiated Contact with Patient 11/30/11 [phone]      Chief Complaint  Patient presents with  . Chest Pain    (Consider location/radiation/quality/duration/timing/severity/associated sxs/prior treatment) HPI  Alexandra [name] is a 78 y.o. female who presents to the Emergency Department complaining of moderate, episodic chest pain onset today with associated symptoms of neck pain. The pt informs the EDP that her artery would hurt, and the pain would travel to her throat. The pt describes the neck pain as a throbbing. Modifying factors include taking nitroglycerin (11 dosages) which provides moderate relief, taking plavix which provides moderate relief. Pt has a hx of stint placement (October 2012), stress test ( 2013), MI, coronary artery disease, arteriosclerotic cardiovascular disease, diabetes.   Pt denies taking coumadin.   PCP is Dr. Kriste Basque.    Past Medical History  Diagnosis Date  . Asthmatic bronchitis   . Sarcoidosis   . HTN (hypertension)   . PVD (peripheral vascular disease)   . Arteriosclerotic cardiovascular disease (ASCVD) 03/2011    07/2010-evaluation by Dr. Antoine Poche for palpitations; 03/2011-non-ST elevation MI complicated by pulmonary edema->DES to subtotal LAD;  residual 50-70% mid circumflex, 50% mid and 70-80% distal RCA, heavily calcified vessels, no LV injection; Echo-akinesis of the distal anteroseptal and apical myocardium with EF of 55%  . Hyperlipemia   . DM (diabetes mellitus)   . Morbid obesity   . GERD (gastroesophageal reflux disease)   . Diverticulosis of colon   . IBS (irritable bowel syndrome)   . DJD (degenerative joint disease)     Hip and knee pain; hip bursitis; patellar subluxation  . Vitamin D deficiency disease   . Fibromyalgia    . Anxiety   . Intermittent vertigo   . Anemia   . Venous insufficiency   . Coronary artery disease     Past Surgical History  Procedure Date  . Cholecystectomy   . Tonsillectomy   . Hernia repair   . Cataract extraction     Family History  Problem Relation Age of Onset  . Heart failure Father   . Leukemia Mother     History  Substance Use Topics  . Smoking status: Never Smoker   . Smokeless tobacco: Never Used  . Alcohol Use: Yes     social    OB History    Grav Para Term Preterm Abortions TAB SAB Ect Mult Living                  Review of Systems  All other systems reviewed and are negative.    10 Systems reviewed and all are negative for acute change except as noted in the HPI.    Allergies  Codeine; Diazepam; Lipitor; Penicillins; and Sulfonamide derivatives  Home Medications   Current Outpatient Rx  Name Route Sig Dispense Refill  . ACETAMINOPHEN 500 MG PO TABS Oral Take 500 mg by mouth every 6 (six) hours as needed.      [name] AMITRIPTYLINE HCL 10 MG PO TABS  TAKE 1 TABLET ONCE DAILY. 30 tablet 4  . AMLODIPINE BESYLATE 2.5 MG PO TABS Oral Take 1 tablet (2.5 mg total) by mouth daily. 30 tablet 12  . VITAMIN C 500 MG PO TABS Oral Take 500  mg by mouth daily. Take one po bid with iron tablet    . ASPIRIN EC 81 MG PO TBEC Oral Take 81 mg by mouth daily.      . AVAPRO 150 MG PO TABS  TAKE (1) TABLET BY MOUTH ONCE DAILY. 30 each 6  . VITAMIN D 1000 UNITS PO CAPS Oral Take 1,000 Units by mouth daily. One po qd     . CLOPIDOGREL BISULFATE 75 MG PO TABS Oral Take 1 tablet (75 mg total) by mouth daily. 30 tablet 11  . VITAMIN B 12 100 MCG PO LOZG Oral Take by mouth.      . ECONAZOLE NITRATE 1 % EX CREA      . FERROUS SULFATE 324 (65 FE) MG PO TBEC Oral Take by mouth. One po bid     . FUROSEMIDE 40 MG PO TABS Oral Take 1 tablet (40 mg total) by mouth daily. One po qd 30 tablet 6  . GLUCOSE BLOOD VI STRP Other 1 each by Other route as needed. Use as instructed      . METFORMIN HCL 500 MG PO TABS Oral Take 1 tablet (500 mg total) by mouth 2 (two) times daily. 60 tablet 6  . METOPROLOL TARTRATE 50 MG PO TABS  Take  1 1/2 tablets (75 mg ) twice daily 90 tablet 12  . NITROGLYCERIN 0.4 MG SL SUBL Sublingual Place 1 tablet (0.4 mg total) under the tongue every 5 (five) minutes as needed for chest pain. 90 tablet 3  . PANTOPRAZOLE SODIUM 40 MG PO TBEC Oral Take 1 tablet (40 mg total) by mouth daily. 30 tablet 6  . VISTARIL 25 MG PO CAPS  TAKE (1) CAPSULE EVERY SIX HOURS AS NEEDED. 50 each 5  . ZOCOR 40 MG PO TABS  TAKE ONE TABLET BY MOUTH AT BEDTIME. 30 each 11    BP 135/58  Pulse 88  Temp 97.9 F (36.6 C)  Resp 18  Ht 5\' 6"  (1.676 m)  Wt 186 lb (84.369 kg)  BMI 30.02 kg/m2  SpO2 96%  Physical Exam  Nursing note and vitals reviewed. Constitutional: She appears well-developed and well-nourished.  HENT:  Head: Atraumatic.  Right Ear: External ear normal.  Left Ear: External ear normal.  Nose: Nose normal.  Neck: Normal range of motion. Neck supple.  Cardiovascular: An irregularly irregular rhythm present. Exam reveals no friction rub.   No murmur heard. Pulmonary/Chest: Effort normal and breath sounds normal. She exhibits no tenderness.  Abdominal: Soft. There is no tenderness.  Musculoskeletal: Normal range of motion. She exhibits edema (3 + bilaterall). She exhibits no tenderness.  Neurological: She is alert.  Skin: Skin is warm and dry.  Psychiatric: She has a normal mood and affect. Her behavior is normal.    ED Course  Procedures (including critical care time)  DIAGNOSTIC STUDIES: Oxygen Saturation is 96% on room air, adequate by my interpretation.    COORDINATION OF CARE:  8:03AM- EDP at bedside discusses treatment plan concerning blood work, irregular heartbeat, atrial fibrillation, chest x-ray.   Results for orders placed during the hospital encounter of 11/30/11  CBC WITH DIFFERENTIAL      Component Value Range   WBC 7.3   4.0 - 10.5 K/uL   RBC 3.57 (*) 3.87 - 5.11 MIL/uL   Hemoglobin 10.5 (*) 12.0 - 15.0 g/dL   HCT 16.1 (*) 09.6 - 04.5 %   MCV 88.2  78.0 - 100.0 fL   MCH 29.4  26.0 - 34.0  pg   MCHC 33.3  30.0 - 36.0 g/dL   RDW 16.1  09.6 - 04.5 %   Platelets 162  150 - 400 K/uL   Neutrophils Relative 81 (*) 43 - 77 %   Neutro Abs 5.9  1.7 - 7.7 K/uL   Lymphocytes Relative 12  12 - 46 %   Lymphs Abs 0.9  0.7 - 4.0 K/uL   Monocytes Relative 4  3 - 12 %   Monocytes Absolute 0.3  0.1 - 1.0 K/uL   Eosinophils Relative 3  0 - 5 %   Eosinophils Absolute 0.2  0.0 - 0.7 K/uL   Basophils Relative 0  0 - 1 %   Basophils Absolute 0.0  0.0 - 0.1 K/uL  COMPREHENSIVE METABOLIC PANEL      Component Value Range   Sodium 134 (*) 135 - 145 mEq/L   Potassium 4.3  3.5 - 5.1 mEq/L   Chloride 93 (*) 96 - 112 mEq/L   CO2 26  19 - 32 mEq/L   Glucose, Bld 177 (*) 70 - 99 mg/dL   BUN 22  6 - 23 mg/dL   Creatinine, Ser 4.09  0.50 - 1.10 mg/dL   Calcium 8.8  8.4 - 81.1 mg/dL   Total Protein 7.3  6.0 - 8.3 g/dL   Albumin 3.9  3.5 - 5.2 g/dL   AST 18  0 - 37 U/L   ALT 8  0 - 35 U/L   Alkaline Phosphatase 67  39 - 117 U/L   Total Bilirubin 0.4  0.3 - 1.2 mg/dL   GFR calc non Af Amer 44 (*) >90 mL/min   GFR calc Af Amer 52 (*) >90 mL/min  CARDIAC PANEL(CRET KIN+CKTOT+MB+TROPI)      Component Value Range   Total CK 51  7 - 177 U/L   CK, MB 2.3  0.3 - 4.0 ng/mL   Troponin I <0.30  <0.30 ng/mL   Relative Index RELATIVE INDEX IS INVALID  0.0 - 2.5   Dg Chest Portable 1 View  11/30/2011  *RADIOLOGY REPORT*  Clinical Data: Chest pain  PORTABLE CHEST - 1 VIEW  Comparison: 03/25/2011 and 03/23/2011  Findings: Cardiomediastinal silhouette is stable.  Central mild vascular congestion without pulmonary edema.  Mitral valve calcifications are again noted. No focal infiltrate.  IMPRESSION: Central mild vascular congestion without convincing pulmonary edema.  No focal infiltrate.  Cardiomegaly again noted.  Original Report Authenticated  By: Natasha Mead, M.D.            No diagnosis found.   Date: 11/30/2011  Rate: 83  Rhythm: atrial fibrillation  QRS Axis: normal  Intervals: normal  ST/T Wave abnormalities: normal  Conduction Disutrbances:none  Narrative Interpretation:   Old EKG Reviewed: changes noted    MDM  The patient presents with chest and neck discomfort that is not unlike her prior heart symptoms.  Her ekg and heart enzymes are negative for MI.  She has stents and most recently had an abnormal lexiscan/myoview in March.  I have consulted medicine for admission.       I personally performed the services described in this documentation, which was scribed in my presence. The recorded information has been reviewed and considered.      Geoffery Lyons, MD 11/30/11 [phone]

## 2011-11-30 NOTE — ED Notes (Signed)
Pt talking with her friend at bedside. Pt comfortable. NAD noted at this time.

## 2011-11-30 NOTE — Consult Note (Signed)
CARDIOLOGY CONSULT NOTE  Patient ID: Alexandra [name] MRN: [id number] DOB/AGE: 12/28/1923 78 y.o.  Admit date: 11/30/2011 Referring Physician: PTH Primary Blair Heys, MD Primary Cardiologist: Dr. [place] Bing Reason for Consultation: Chest Pain with CAD  Active Problems:  Sarcoidosis  DM  VITAMIN D DEFICIENCY  HYPERLIPIDEMIA  ANEMIA  HYPERTENSION  GERD  Irritable bowel syndrome  FIBROMYALGIA  Cerebrovascular disease  Arteriosclerotic cardiovascular disease (ASCVD)  DJD (degenerative joint disease)  Anxiety  HPI: Alexandra [name] is a 78 year old Caucasian female patient of Dr. Morning Glory Bing that we had been following for known history of CAD with frequent palpitations. She had a non-ST elevated MI and October 2012 with a cardiac catheterization revealing three-vessel coronary artery disease. A culprit lesion in the LAD was stented with medical therapy advised for moderate lesions in the circumflex and RCA. She has developed recurring pain in her neck and left shoulder with chest discomfort within the last 6 months. She was scheduled for stress Myoview to evaluate for ischemia back in May. During stress Myoview the patient developed supraventricular tachycardia, possibly reentrant mechanism, with retrograde P waves in lead V1. She was symptomatic with chest pain at that time and had abnormal ST-T wave changes. The rhythm spontaneously reverted to normal sinus rhythm without intervention. The perfusion imaging showed a combination of soft tissue attenuation and scar in the anterior septal distribution, with minor ischemia in the apex. EF was 77%. She was continued on medical management and also started on a PPI for possible reflux symptoms.  She reports an escalation in symptoms within the last week. Overnight the patient began to experience chest discomfort substernal radiating into her neck and left shoulder and down her left arm. This occurred while she was lying in bed initially  and then had been occurring when she would get up to walk less than 10 feet. She took several nitroglycerin tablets approximately 10 minutes apart as it did help the pain subside although it returned. She states that it began to scare her is the pain continued so she came to the emergency room. She states that the pain is almost exactly like the pain she experienced prior to having had her non-ST elevated MI and 2012. The patient states that she has been easily diaphoretic and fatigued walking less than 10 feet as well with and without discomfort in her chest. She denies having any palpitations however.    On arrival to the emergency room the patient's blood pressure was 135/58 heart rate 88 temperature 97.9 with an O2 sat of 96%. Labs are reviewed sodium was 134 glucose was 177 her creatinine was 1.10. She was mildly anemic with a hemoglobin of 10.5 hematocrit of 31.5 with most recent labs to compare this to in March of 2013 with a hemoglobin of 11.3 hematocrit 33.5. Troponins have been negative x2. Chest x-ray revealed central mild vascular congestion without convincing pulmonary edema with no focal infiltrate.     She is currently pain-free and when she gets up and walks to the bathroom.      Review of systems complete and found to be negative unless listed above   Past Medical History  Diagnosis Date  . Asthmatic bronchitis   . Sarcoidosis   . HTN (hypertension)   . PVD (peripheral vascular disease)   . Arteriosclerotic cardiovascular disease (ASCVD) 03/2011    07/2010-evaluation by Dr. Antoine Poche for palpitations; 03/2011-non-ST elevation MI complicated by pulmonary edema->DES to subtotal LAD;  residual 50-70% mid circumflex, 50% mid and 70-80% distal  RCA, heavily calcified vessels, no LV injection; Echo-akinesis of the distal anteroseptal and apical myocardium with EF of 55%  . Hyperlipemia   . DM (diabetes mellitus)   . Morbid obesity   . GERD (gastroesophageal reflux disease)   .  Diverticulosis of colon   . IBS (irritable bowel syndrome)   . DJD (degenerative joint disease)     Hip and knee pain; hip bursitis; patellar subluxation  . Vitamin D deficiency disease   . Fibromyalgia   . Anxiety   . Intermittent vertigo   . Anemia   . Venous insufficiency   . Coronary artery disease     Family History  Problem Relation Age of Onset  . Heart failure Father   . Leukemia Mother     History   Social History  . Marital Status: Widowed    Spouse Name: N/A    Number of Children: 1  . Years of Education: N/A   Occupational History  . Not on file.   Social History Main Topics  . Smoking status: Never Smoker   . Smokeless tobacco: Never Used  . Alcohol Use: Yes     social  . Drug Use: No  . Sexually Active: Not on file   Other Topics Concern  . Not on file   Social History Narrative   Pt is only child. Husband Duanne Guess passed away in 2004Pt has 1 son in [place](his wife is MD doing [employer] )     Past Surgical History  Procedure Date  . Cholecystectomy   . Tonsillectomy   . Hernia repair   . Cataract extraction      Prescriptions prior to admission  Medication Sig Dispense Refill  . acetaminophen (TYLENOL) 500 MG tablet Take 500 mg by mouth every 6 (six) hours as needed.        [name] amitriptyline (ELAVIL) 10 MG tablet       . amLODipine (NORVASC) 2.5 MG tablet Take 2.5 mg by mouth at bedtime.      . Ascorbic Acid (VITAMIN C) 500 MG tablet Take 500 mg by mouth 2 (two) times daily. Take one po bid with iron tablet      . aspirin EC 81 MG tablet Take 81 mg by mouth every morning.       . Cholecalciferol (VITAMIN D) 1000 UNITS capsule Take 1,000 Units by mouth every morning. One po qd      . clopidogrel (PLAVIX) 75 MG tablet Take 75 mg by mouth every morning.      [name] econazole nitrate 1 % cream Apply 1 application topically daily as needed. FOR FEET IRRITATION      . ferrous sulfate 324 (65 FE) MG TBEC Take 1 tablet by mouth 2 (two) times daily.       .  furosemide (LASIX) 40 MG tablet Take 40 mg by mouth daily.      . hydrOXYzine (VISTARIL) 25 MG capsule       . irbesartan (AVAPRO) 150 MG tablet       . metFORMIN (GLUCOPHAGE) 500 MG tablet Take 1 tablet (500 mg total) by mouth 2 (two) times daily.  60 tablet  6  . metoprolol (LOPRESSOR) 50 MG tablet Take 75 mg by mouth 2 (two) times daily. Take  1 1/2 tablets (75 mg ) twice daily      . Neomycin-Bacitracin-Polymyxin (LANABIOTIC) 5-500-10000 OINT Apply 1 application topically 2 (two) times daily as needed.      . nitroGLYCERIN (NITROSTAT) 0.4 MG  SL tablet Place 0.4 mg under the tongue every 5 (five) minutes x 3 doses as needed.      . pantoprazole (PROTONIX) 40 MG tablet Take 40 mg by mouth every morning.      . vitamin B-12 (CYANOCOBALAMIN) 1000 MCG tablet Take 1,000 mcg by mouth every morning.      [name] ZOCOR 40 MG tablet TAKE ONE TABLET BY MOUTH AT BEDTIME.  30 each  11  . glucose blood test strip 1 each by Other route as needed. Use as instructed        Stress Test 08/03/2011 IMPRESSION: Abnormal Lexiscan Myoview as outlined. Following infusion of Lexiscan the patient developed a supraventricular tachycardia, possibly reentrant mechanism with retrograde P-waves in lead V1. She is symptomatic with chest pain at that time, and had abnormal ST-segment changes. Rhythm spontaneously reverted to sinus rhythm without intervention. Perfusion imaging shows perhaps a combination of soft tissue attenuation and scar in the anteroseptal distribution, minor ischemia at the apex. LVEF is normal 77% without wall motion abnormality.  Cardiac Catheterization:  FINAL CONCLUSION:  1. Nonobstructive left main disease.  2. Critical LAD stenosis with emergent stenting of the proximal LAD.  3. Moderate left circumflex stenosis.  4. Moderate-to-severe right coronary artery stenosis  PROCEDURE:  1. Left heart catheterization.  2. Selective coronary angiography.  3. Percutaneous transluminal coronary  angioplasty and stenting of the  left anterior descending.  4. Perclose of the right femoral artery.   Physical Exam: Blood pressure 153/66, pulse 83, temperature 97.6 F (36.4 C), resp. rate 20, height 5\' 6"  (1.676 m), weight 188 lb 7.9 oz (85.5 kg), SpO2 96.00%.   General: Well developed, well nourished, in no acute distress Head: Eyes PERRLA, No xanthomas.   Normal cephalic and atramatic  Lungs: Clear bilaterally to auscultation and percussion. Heart: HRIR S1 S2, 1/6 SM. Pulses 2+ & equal.            No carotid bruit. No JVD.  No abdominal bruits. No femoral bruits. Abdomen: Bowel sounds are positive, abdomen soft and non-tender without masses or                  Hernia's noted. Msk:  Back normal, normal gait. Normal strength and tone for age. Extremities: No clubbing, cyanosis or edema.  DP +1 Neuro: Alert and oriented X 3. Psych:  Good affect, responds appropriately   Labs:   Lab Results  Component Value Date   WBC 7.3 11/30/2011   HGB 10.5* 11/30/2011   HCT 31.5* 11/30/2011   MCV 88.2 11/30/2011   PLT 162 11/30/2011    Lab 11/30/11 0811  NA 134*  K 4.3  CL 93*  CO2 26  BUN 22  CREATININE 1.10  CALCIUM 8.8  PROT 7.3  BILITOT 0.4  ALKPHOS 67  ALT 8  AST 18  GLUCOSE 177*   Lab Results  Component Value Date   CKTOTAL 54 11/30/2011   CKMB 2.3 11/30/2011   TROPONINI <0.30 11/30/2011    Lab Results  Component Value Date   CHOL 112 08/25/2011   CHOL 134 07/22/2010   CHOL 129 07/09/2009   Lab Results  Component Value Date   HDL 46.00 08/25/2011   HDL 16.10 07/22/2010   HDL 96.04 07/09/2009   Lab Results  Component Value Date   LDLCALC 35 08/25/2011   LDLCALC 53 07/22/2010   LDLCALC 51 07/09/2009   Lab Results  Component Value Date   TRIG 157.0* 08/25/2011   TRIG 159.0* 07/22/2010  TRIG 129.0 07/09/2009   Lab Results  Component Value Date   CHOLHDL 2 08/25/2011   CHOLHDL 3 07/22/2010   CHOLHDL 2 07/09/2009   No results found for this basename: LDLDIRECT       Radiology: Dg Chest Portable 1 View  11/30/2011  *RADIOLOGY REPORT*  Clinical Data: Chest pain  PORTABLE CHEST - 1 VIEW  Comparison: 03/25/2011 and 03/23/2011  Findings: Cardiomediastinal silhouette is stable.  Central mild vascular congestion without pulmonary edema.  Mitral valve calcifications are again noted. No focal infiltrate.  IMPRESSION: Central mild vascular congestion without convincing pulmonary edema.  No focal infiltrate.  Cardiomegaly again noted.  Original Report Authenticated By: Natasha Mead, M.D.   EKG:SR with sinus arrhythmia, PAC Nonspecific ST abnormality  ASSESSMENT AND PLAN:   1. CAD with Angina:  She has known history of coronary artery disease with stent to the LAD in 10/12 with residual moderate disease in the circumflex and RCA. She is having progressive symptoms concerning for ischemic pain with recent stress Myoview results from May noted above. The patient has been seen and examined by myself and Dr. Nona Dell here in the hospital. With concerns for unstable angina, plan is to transfer her to [facility] hospital tomorrow for a diagnostic cardiac catheterization to assess revascularization options. This has been discussed with the patient who verbalizes understanding and is willing to proceed. In the interim the patient will remain on Plavix, aspirin, Norvasc, and beta blocker. I will place nitroglycerin paste topically, and she will continue on Lovenox. Will hold this dose in the a.m. prior to catheterization.  2. Hypertension: Mildly elevated here in the hospital. She is on metoprolol 75 mg twice a day, and amlodipine 2.5 mg at at bedtime. We will not make any changes as we are adding a nitrate at this time. She may need to have titration of her medications once catheterization is completed if no worsening CAD is found.   Signed: Bettey Mare. Lyman Bishop NP [facility] 11/30/2011, 4:09 PM Co-Sign MD   Attending note:  Patient seen and examined. Reviewed records  and available database as recorded by Ms. Lawrence. History reviewed including cardiac catheterization from October of last year as well as most recent Myoview results from May. She presents with escalating symptoms of chest and neck discomfort concerning for accelerating angina, actually unstable angina within the last 2 or 3 days. She has taken several nitroglycerin starting last evening with intermittent improvement in her symptoms. Although she has had documented PSVT, at least during her Myoview test, with prior history of palpitations, she has not voiced any similar concerns recently.  At this point she is pain-free and hemodynamically stable on examination. Lungs are clear and nonlabored, cardiac exam with regular rate and rhythm, no S3.  Initial lab work shows normal troponin levels. ECG is reviewed showing sinus rhythm with frequent PACs, nonspecific ST changes.  After discussing the situation with the patient, plan is to arrange transfer to [facility] tomorrow for a diagnostic cardiac catheterization to assess revascularization options in light of her progressive symptoms. Medical therapy will be continued with the addition of nitroglycerin and Lovenox. After discussing the risks and benefits, she is in agreement to proceed.  Jonelle Sidle, M.D., F.A.C.C.

## 2011-12-01 ENCOUNTER — Ambulatory Visit (HOSPITAL_COMMUNITY): Admit: 2011-12-01 | Payer: Self-pay | Admitting: Cardiology

## 2011-12-01 ENCOUNTER — Encounter (HOSPITAL_COMMUNITY)
Admission: EM | Disposition: A | Discharge status: Home / Self Care | Payer: Self-pay | Source: Home / Self Care | Attending: Cardiology

## 2011-12-01 DIAGNOSIS — I2510 Atherosclerotic heart disease of native coronary artery without angina pectoris: Secondary | ICD-10-CM

## 2011-12-01 HISTORY — PX: PERCUTANEOUS CORONARY STENT INTERVENTION (PCI-S): SHX5485

## 2011-12-01 HISTORY — PX: LEFT HEART CATHETERIZATION WITH CORONARY ANGIOGRAM: SHX5451

## 2011-12-01 LAB — GLUCOSE, CAPILLARY: Glucose-Capillary: 162 mg/dL — ABNORMAL HIGH (ref 70–99)

## 2011-12-01 LAB — BASIC METABOLIC PANEL
Calcium: 8.9 mg/dL (ref 8.4–10.5)
GFR calc Af Amer: 51 mL/min — ABNORMAL LOW (ref >90)
GFR calc non Af Amer: 44 mL/min — ABNORMAL LOW (ref >90)
Sodium: 140 mEq/L (ref 135–145)

## 2011-12-01 LAB — PROTIME-INR
INR: 1.07 (ref 0.00–1.49)
Prothrombin Time: 14.1 seconds (ref 11.6–15.2)

## 2011-12-01 SURGERY — LEFT HEART CATHETERIZATION WITH CORONARY ANGIOGRAM
Anesthesia: LOCAL

## 2011-12-01 MED ORDER — FENTANYL CITRATE 0.05 MG/ML IJ SOLN
INTRAMUSCULAR | Status: AC
  Filled 2011-12-01: qty 2

## 2011-12-01 MED ORDER — NITROGLYCERIN 0.2 MG/ML ON CALL CATH LAB
INTRAVENOUS | Status: AC
  Filled 2011-12-01: qty 1

## 2011-12-01 MED ORDER — BIVALIRUDIN 250 MG IV SOLR
INTRAVENOUS | Status: AC
  Filled 2011-12-01: qty 250

## 2011-12-01 MED ORDER — HEPARIN (PORCINE) IN NACL 2-0.9 UNIT/ML-% IJ SOLN
INTRAMUSCULAR | Status: AC
  Filled 2011-12-01: qty 2000

## 2011-12-01 MED ORDER — MIDAZOLAM HCL 2 MG/2ML IJ SOLN
INTRAMUSCULAR | Status: AC
  Filled 2011-12-01: qty 2

## 2011-12-01 MED ORDER — SODIUM CHLORIDE 0.9 % IV SOLN: 1.0000 mL/kg/h | INTRAVENOUS | Status: AC

## 2011-12-01 MED ORDER — LIDOCAINE HCL (PF) 1 % IJ SOLN
INTRAMUSCULAR | Status: AC
  Filled 2011-12-01: qty 30

## 2011-12-01 NOTE — Progress Notes (Signed)
Report given to nurse on 2000.

## 2011-12-01 NOTE — CV Procedure (Signed)
   CARDIAC CATH NOTE  Name: Alexandra [name] MRN: [id number] DOB: [birth year]  Procedure: PTCA of the proximal LAD for in-stent restenosis.  Indication: 78 year old white female status post stenting of the proximal LAD in October 2012 with a DES. She presents now with unstable angina. Diagnostic angiogram demonstrates focal in-stent restenosis of the LAD to 99%.  Procedural Details: A 6 Fr sheath was introduced into the right femoral artery and exchanged for the diagnostic sheath.  Weight-based bivalirudin was given for anticoagulation. Once a therapeutic ACT was achieved, a 6 [place] left Voda 3.5 guide catheter was inserted.  An Asahi medium coronary guidewire was used to cross the lesion.  The lesion was dilated with a 3.0 x 10 mm cutting balloon up to 8 atmospheres.   Following PCI, there was less than 10%  residual stenosis and TIMI-3 flow. Final angiography confirmed an excellent result. The patient tolerated the procedure well. There were no immediate procedural complications. Femoral hemostasis was achieved with manual compression. The patient was transferred to the post catheterization recovery area for further monitoring.  Lesion Data: Vessel: Proximal LAD Percent stenosis (pre): 99% TIMI-flow (pre):  2 PCI: Cutting balloon 3.0 x 10 mm Percent stenosis (post): Less than 10% TIMI-flow (post): 3  Conclusions: Successful cutting balloon angioplasty of the proximal LAD for in-stent restenosis.  Recommendations: Continue dual antiplatelet therapy. Patient will be considered for staged procedure for additional lesion in the native right coronary.  Theron Arista [facility] 12/01/2011, 6:35 PM

## 2011-12-01 NOTE — Progress Notes (Signed)
UR Chart Review Completed  

## 2011-12-01 NOTE — Progress Notes (Addendum)
Primary cardiologist: Dr. Rothbart  SUBJECTIVE:Continues chest discomfort with minimal exertion, but lessened with NTG paste application. Ready to be transferred to [facility] for cath today.  Active Problems:  Sarcoidosis  DM  VITAMIN D DEFICIENCY  HYPERLIPIDEMIA  ANEMIA  HYPERTENSION  GERD  Irritable bowel syndrome  FIBROMYALGIA  Cerebrovascular disease  Arteriosclerotic cardiovascular disease (ASCVD)  DJD (degenerative joint disease)  Anxiety  Unstable angina  PSVT (paroxysmal supraventricular tachycardia)   LABS: Basic Metabolic Panel:  Basename 12/01/11 0516 11/30/11 0811  NA 140 134*  K 4.0 4.3  CL 98 93*  CO2 30 26  GLUCOSE 117* 177*  BUN 20 22  CREATININE 1.11* 1.10  CALCIUM 8.9 8.8  MG -- --  PHOS -- --   Liver Function Tests:  Basename 11/30/11 0811  AST 18  ALT 8  ALKPHOS 67  BILITOT 0.4  PROT 7.3  ALBUMIN 3.9   CBC:  Basename 11/30/11 0811  WBC 7.3  NEUTROABS 5.9  HGB 10.5*  HCT 31.5*  MCV 88.2  PLT 162   Cardiac Enzymes:  Basename 11/30/11 1948 11/30/11 1408 11/30/11 0811  CKTOTAL 43 54 51  CKMB 2.1 2.3 2.3  CKMBINDEX -- -- --  TROPONINI <0.30 <0.30 <0.30     RADIOLOGY: Dg Chest Portable 1 View  11/30/2011  *RADIOLOGY REPORT*  Clinical Data: Chest pain  PORTABLE CHEST - 1 VIEW  Comparison: 03/25/2011 and 03/23/2011  Findings: Cardiomediastinal silhouette is stable.  Central mild vascular congestion without pulmonary edema.  Mitral valve calcifications are again noted. No focal infiltrate.  IMPRESSION: Central mild vascular congestion without convincing pulmonary edema.  No focal infiltrate.  Cardiomegaly again noted.  Original Report Authenticated By: LIVIU POP, M.D.     PHYSICAL EXAM BP 112/69  Pulse 72  Temp 98 F (36.7 C) (Oral)  Resp 20  Ht 5' 6" (1.676 m)  Wt 188 lb 7.9 oz (85.5 kg)  BMI 30.42 kg/m2  SpO2 92% General: Well developed, well nourished, in no acute distress Head: Eyes PERRLA, No xanthomas.   Normal cephalic  and atramatic  Lungs: Clear bilaterally to auscultation and percussion. Heart: HRRR S1 S2, No MRG .  Pulses are 2+ & equal.            No carotid bruit. No JVD.  No abdominal bruits. No femoral bruits. Abdomen: Bowel sounds are positive, abdomen soft and non-tender without masses or                  Hernia's noted. Msk:  Back normal, normal gait. Normal strength and tone for age. Extremities: No clubbing, cyanosis or edema.  DP +1 Neuro: Alert and oriented X 3. Psych:  Good affect, responds appropriately  TELEMETRY: Reviewed telemetry pt in NSR.PAC's.  ASSESSMENT AND PLAN:  1. CAD with progressive angina: She has known history of coronary artery disease with stent to the LAD in 10/12 with residual moderate disease in the circumflex and RCA. She is having progressive symptoms concerning for ischemic pain with recent stress Myoview results from May reviewed. Cardiac markers are negative. Plan is for transfer to [facility] for diagnostic cardiac catheterization today. Discussed with Dr. Sullivan, hospitalist who is in agreement.    Patient wishes to be seen by GI while at [facility] should cardiac cath determine no progression of CAD, for epigastric pain. She has been seen by Dr. Dora Brodie of [facility] in the past.   2. Hypertension, blood pressure controlled.  3. Hyperlipidemia, on Zocor.  4. History of PSVT,   no recent palpitations. Telemetry stable.  Kathryn M. Lawrence NP [facility] 12/01/2011, 8:03 AM  Attending note:  Plan discussed with patient and Ms. Lawrence. Cardiac markers are normal. Transfer to [facility] today for diagnostic cardiac catheterization to assess stent patency and also reevaluate for potential revascularization options. She reports escalating symptoms within the last few months with mildly abnormal Myoview in May. No definite PSVT documented during observation to explain symptoms otherwise.  Jennesis Ramaswamy G. Osualdo Hansell, M.D., F.A.C.C.  

## 2011-12-01 NOTE — Progress Notes (Signed)
Site area: right groin  Site Prior to Removal:  Level 0  Pressure Applied For 25 MINUTES    Minutes Beginning at 20:25  Manual:   yes  Patient Status During Pull:  WNL  Post Pull Groin Site:  Level 0  Post Pull Instructions Given:  yes  Post Pull Pulses Present:  yes  Dressing Applied:  yes

## 2011-12-01 NOTE — H&P (View-Only) (Signed)
Primary cardiologist: Dr. Dietrich Pates  SUBJECTIVE:Continues chest discomfort with minimal exertion, but lessened with NTG paste application. Ready to be transferred to [facility] for cath today.  Active Problems:  Sarcoidosis  DM  VITAMIN D DEFICIENCY  HYPERLIPIDEMIA  ANEMIA  HYPERTENSION  GERD  Irritable bowel syndrome  FIBROMYALGIA  Cerebrovascular disease  Arteriosclerotic cardiovascular disease (ASCVD)  DJD (degenerative joint disease)  Anxiety  Unstable angina  PSVT (paroxysmal supraventricular tachycardia)   LABS: Basic Metabolic Panel:  Basename 12/01/11 0516 11/30/11 0811  NA 140 134*  K 4.0 4.3  CL 98 93*  CO2 30 26  GLUCOSE 117* 177*  BUN 20 22  CREATININE 1.11* 1.10  CALCIUM 8.9 8.8  MG -- --  PHOS -- --   Liver Function Tests:  [facility] 11/30/11 0811  AST 18  ALT 8  ALKPHOS 67  BILITOT 0.4  PROT 7.3  ALBUMIN 3.9   CBC:  Basename 11/30/11 0811  WBC 7.3  NEUTROABS 5.9  HGB 10.5*  HCT 31.5*  MCV 88.2  PLT 162   Cardiac Enzymes:  Basename 11/30/11 1948 11/30/11 1408 11/30/11 0811  CKTOTAL 43 54 51  CKMB 2.1 2.3 2.3  CKMBINDEX -- -- --  TROPONINI <0.30 <0.30 <0.30     RADIOLOGY: Dg Chest Portable 1 View  11/30/2011  *RADIOLOGY REPORT*  Clinical Data: Chest pain  PORTABLE CHEST - 1 VIEW  Comparison: 03/25/2011 and 03/23/2011  Findings: Cardiomediastinal silhouette is stable.  Central mild vascular congestion without pulmonary edema.  Mitral valve calcifications are again noted. No focal infiltrate.  IMPRESSION: Central mild vascular congestion without convincing pulmonary edema.  No focal infiltrate.  Cardiomegaly again noted.  Original Report Authenticated By: Natasha Mead, M.D.     PHYSICAL EXAM BP 112/69  Pulse 72  Temp 98 F (36.7 C) (Oral)  Resp 20  Ht 5\' 6"  (1.676 m)  Wt 188 lb 7.9 oz (85.5 kg)  BMI 30.42 kg/m2  SpO2 92% General: Well developed, well nourished, in no acute distress Head: Eyes PERRLA, No xanthomas.   Normal cephalic  and atramatic  Lungs: Clear bilaterally to auscultation and percussion. Heart: HRRR S1 S2, No MRG .  Pulses are 2+ & equal.            No carotid bruit. No JVD.  No abdominal bruits. No femoral bruits. Abdomen: Bowel sounds are positive, abdomen soft and non-tender without masses or                  Hernia's noted. Msk:  Back normal, normal gait. Normal strength and tone for age. Extremities: No clubbing, cyanosis or edema.  DP +1 Neuro: Alert and oriented X 3. Psych:  Good affect, responds appropriately  TELEMETRY: Reviewed telemetry pt in NSR.PAC's.  ASSESSMENT AND PLAN:  1. CAD with progressive angina: She has known history of coronary artery disease with stent to the LAD in 10/12 with residual moderate disease in the circumflex and RCA. She is having progressive symptoms concerning for ischemic pain with recent stress Myoview results from May reviewed. Cardiac markers are negative. Plan is for transfer to [facility] for diagnostic cardiac catheterization today. Discussed with Dr. Lendell Caprice, hospitalist who is in agreement.    Patient wishes to be seen by GI while at [facility] should cardiac cath determine no progression of CAD, for epigastric pain. She has been seen by Dr. Lina Sar of [facility] in the past.   2. Hypertension, blood pressure controlled.  3. Hyperlipidemia, on Zocor.  4. History of PSVT,  no recent palpitations. Telemetry stable.  Bettey Mare. Lyman Bishop NP [facility] 12/01/2011, 8:03 AM  Attending note:  Plan discussed with patient and Ms. Lawrence. Cardiac markers are normal. Transfer to [facility] today for diagnostic cardiac catheterization to assess stent patency and also reevaluate for potential revascularization options. She reports escalating symptoms within the last few months with mildly abnormal Myoview in May. No definite PSVT documented during observation to explain symptoms otherwise.  Jonelle Sidle, M.D., F.A.C.C.

## 2011-12-01 NOTE — Interval H&P Note (Signed)
History and Physical Interval Note:  12/01/2011 5:09 PM  Alexandra [name]  has presented today for surgery, with the diagnosis of Chest pain  The various methods of treatment have been discussed with the patient and family. After consideration of risks, benefits and other options for treatment, the patient has consented to  Procedure(s) (LRB): LEFT HEART CATHETERIZATION WITH CORONARY ANGIOGRAM (N/A) as a surgical intervention .  The patient's history has been reviewed, patient examined, no change in status, stable for surgery.  I have reviewed the patients' chart and labs.  Questions were answered to the patient's satisfaction.     Dalton [employer]

## 2011-12-01 NOTE — CV Procedure (Signed)
   Cardiac Catheterization Procedure Note  Name: Alexandra [name] MRN: [id number] DOB: 31-May-1924  Procedure: Left Heart Cath, Selective Coronary Angiography, LV angiography  Indication: Unstable angina   Procedural details: The right groin was prepped, draped, and anesthetized with 1% lidocaine. Using modified Seldinger technique, a 5 French sheath was introduced into the right femoral artery. Standard Judkins catheters were used for coronary angiography and left ventriculography. Catheter exchanges were performed over a guidewire. There were no immediate procedural complications.   Procedural Findings: Hemodynamics:  AO 136/65 LV 146/29   Coronary angiography: Coronary dominance: right  Left mainstem: Long vessel with 30% proximal and 40% distal stenosis.   Left anterior descending (LAD): 99% proximal LAD stenosis within stent.  50% mid LAD stenosis. Diffuse mild distal vessel disease.   Left circumflex (LCx): 50% ostial LCx stenosis, 50% mid LCx stenosis prior to large PLOM.  Right coronary artery (RCA): Diffusely diseased RCA. 50% mid RCA stenosis. 90% mid to distal focal stenosis.   Left ventriculography: Hand left ventriculogram done due to elevated LV EDP.  EF appeared within normal range, 55-60%.  Will need echo eventually.   Final Conclusions:  Tight 99% focal proximal LAD stenosis within DES placed in 10/12.  90% focal mid to distal RCA stenosis.   Recommendations: Plan cutting balloon to proximal LAD stenosis today with likely staged RCA intervention.  Discussed with Dr. Swaziland.   [name] 12/01/2011, 5:43 PM

## 2011-12-02 DIAGNOSIS — R079 Chest pain, unspecified: Secondary | ICD-10-CM

## 2011-12-02 LAB — CBC
HCT: 28.9 % — ABNORMAL LOW (ref 36.0–46.0)
Hemoglobin: 9.6 g/dL — ABNORMAL LOW (ref 12.0–15.0)
MCH: 29.2 pg (ref 26.0–34.0)
MCHC: 33.2 g/dL (ref 30.0–36.0)
RDW: 13.4 % (ref 11.5–15.5)

## 2011-12-02 LAB — BASIC METABOLIC PANEL
BUN: 14 mg/dL (ref 6–23)
Calcium: 8.2 mg/dL — ABNORMAL LOW (ref 8.4–10.5)
Creatinine, Ser: 0.93 mg/dL (ref 0.50–1.10)
GFR calc Af Amer: 63 mL/min — ABNORMAL LOW (ref >90)
GFR calc non Af Amer: 54 mL/min — ABNORMAL LOW (ref >90)
Glucose, Bld: 125 mg/dL — ABNORMAL HIGH (ref 70–99)

## 2011-12-02 LAB — GLUCOSE, CAPILLARY: Glucose-Capillary: 115 mg/dL — ABNORMAL HIGH (ref 70–99)

## 2011-12-02 MED FILL — Dextrose Inj 5%: INTRAVENOUS | Qty: 50 | Status: AC

## 2011-12-02 NOTE — Progress Notes (Signed)
Utilization Review Completed.  Afnan Emberton, Whiting T  12/02/2011

## 2011-12-02 NOTE — Progress Notes (Signed)
Patient ID: Alexandra [name], female   DOB: [birth year], 78 y.o.   MRN: [id number]    Subjective:  Denies SSCP, palpitations or Dyspnea Has not been OOB  Objective:  Filed Vitals:   12/02/11 0400 12/02/11 0500 12/02/11 0600 12/02/11 0700  BP: 110/42 100/55 105/53 93/40  Pulse: 69 65 58 74  Temp: 98.2 F (36.8 C)   98 F (36.7 C)  TempSrc: Oral   Oral  Resp: 18   18  Height:      Weight:      SpO2: 97% 95% 97% 97%    Intake/Output from previous day:  Intake/Output Summary (Last 24 hours) at 12/02/11 0815 Last data filed at 12/02/11 0200  Gross per 24 hour  Intake    684 ml  Output    500 ml  Net    184 ml    Physical Exam: Affect appropriate Obese pale white female HEENT: normal Neck supple with no adenopathy JVP normal no bruits no thyromegaly Lungs clear with no wheezing and good diaphragmatic motion Heart:  S1/S2 no murmur, no rub, gallop or click PMI normal Abdomen: benighn, BS positve, no tenderness, no AAA no bruit.  No HSM or HJR Distal pulses intact with no bruits No edema Neuro non-focal Skin warm and dry No muscular weakness Right groin A no hematoma   Lab Results: Basic Metabolic Panel:  Basename 12/02/11 0412 12/01/11 0516  NA 141 140  K 4.1 4.0  CL 102 98  CO2 26 30  GLUCOSE 125* 117*  BUN 14 20  CREATININE 0.93 1.11*  CALCIUM 8.2* 8.9  MG -- --  PHOS -- --   Liver Function Tests:  [facility] 11/30/11 0811  AST 18  ALT 8  ALKPHOS 67  BILITOT 0.4  PROT 7.3  ALBUMIN 3.9   No results found for this basename: LIPASE:2,AMYLASE:2 in the last 72 hours CBC:  Basename 12/02/11 0412 11/30/11 0811  WBC 7.6 7.3  NEUTROABS -- 5.9  HGB 9.6* 10.5*  HCT 28.9* 31.5*  MCV 87.8 88.2  PLT 133* 162   Cardiac Enzymes:  Basename 11/30/11 1948 11/30/11 1408 11/30/11 0811  CKTOTAL 43 54 51  CKMB 2.1 2.3 2.3  CKMBINDEX -- -- --  TROPONINI <0.30 <0.30 <0.30    Imaging: No results found.  Cardiac Studies:  ECG: NSR PAC read by computer as  afib but P waves seen   Telemetry:  NSR no VT or PAF  12/02/2011  Medications:     . amLODipine  2.5 mg Oral QHS  . aspirin  324 mg Oral Pre-Cath  . aspirin EC  81 mg Oral q morning - 10a  . bivalirudin      . clopidogrel  75 mg Oral QAC breakfast  . fentaNYL      . ferrous sulfate  325 mg Oral BID  . furosemide  40 mg Oral Daily  . heparin      . irbesartan  150 mg Oral Daily  . lidocaine      . metoprolol  75 mg Oral BID  . midazolam      . nitroGLYCERIN      . pantoprazole  40 mg Oral BID AC  . simvastatin  40 mg Oral q1800  . sodium chloride  3 mL Intravenous Q12H  . vitamin B-12  1,000 mcg Oral q morning - 10a  . vitamin C  500 mg Oral BID  . DISCONTD: metFORMIN  500 mg Oral BID WC  . DISCONTD: nitroGLYCERIN  1 inch Topical Q6H  . DISCONTD: sodium chloride  3 mL Intravenous Q12H       . sodium chloride Stopped (12/02/11 0210)  . sodium chloride      Assessment/Plan:  CAD:  S/P cutting balloon PCI instent LAD stenosis.  Residual RCA disease. ? Medical Rx per patient  Needs to ambulate And possible D/C in am if no further intervention Follow Hct  Continue asa, plavix and beta blocker HTN:  Stable continue ARB  Charlton Haws 12/02/2011, 8:15 AM

## 2011-12-02 NOTE — Progress Notes (Signed)
CARDIAC REHAB PHASE I   PRE:  Rate/Rhythm: 81 SR with many PACs, 130 on monitor when standing and anxious    BP: sitting 123/56    SaO2: 93-100 2L, 92 RA, standing 87 RA when HR up and anxious  MODE:  Ambulation: 140 ft   POST:  Rate/Rhythm: 100 irregular     BP: sitting 125/62     SaO2: 86 RA after a few feet, applied 3L and ranged 85-100 during walk  SATURATION QUALIFICATIONS:  Patient Saturations on Room Air at Rest = 92%  Patient Saturations on Room Air while Ambulating = 86%  Patient Saturations on 3 Liters of oxygen while Ambulating = 85-100%  Attempted to stand pt without RW in right in front of her. Knees weak and needed increased assist. Became anxious upon standing and froze. HR up to 130s, SaO2 down to 87 RA. Sat back down to allow pt to rest. Got RW in front of her, O2, gait belt and another assist person. Able to stand better, no anxiety, HR stable. Initially attempted RA but SaO2 down to 85 RA at door. Used RW, assist x2 and 3L O2 for 140 ft in hall. No c/o. Very talkative and saO2 fluctuated entire walk. Not sure of complete accuracy however feel confident that RA SaO2 with activity decreases to 86-87. Will need HHRN/PT upon d/c and also O2. Reviewed NTG. Pt difficult to ed due to talkative nature and being "set in her ways". (I.e. Refuses to call 911 if taking NTG, reluctant to carry with her) [phone]  Harriet Masson CES, ACSM

## 2011-12-02 NOTE — Care Management Note (Signed)
  Page 1 of 1   12/02/2011     4:29:50 PM   CARE MANAGEMENT NOTE 12/02/2011  Patient:  Alexandra [name], Alexandra [name]   Account Number:  [id number]  Date Initiated:  12/02/2011  Documentation initiated by:  Alvira Philips Assessment:   78 yr-old female adm with dx of ASCVD; lives alone, has rollator, cane, tub bench, h/o home health services through [facility].        DC Planning Services  CM consult      PAC Choice  DURABLE MEDICAL EQUIPMENT  HOME HEALTH   Choice offered to / List presented to:  C-1 Patient   DME arranged  [facility]      DME agency  [facility]     [facility] arranged  HH-1 RN      [facility] agency  [facility]   PCP:  Dr. Alroy Dust  12/02/11 1600 Fanchon Papania RN MSN CCM Pt will need home O2 and home health RN, declines home health PT.  Provided list of [facility] agencies, referral made per pt choice.  Pt states she lives in senior apt complex and has good support from friends and neighbors.

## 2011-12-03 MED ORDER — SODIUM CHLORIDE 0.9 % IV SOLN
1.0000 mL/kg/h | INTRAVENOUS | Status: DC
  Administered 2011-12-04: 1 mL/kg/h via INTRAVENOUS

## 2011-12-03 MED ORDER — ASPIRIN 81 MG PO CHEW
324.0000 mg | CHEWABLE_TABLET | ORAL | Status: AC
  Administered 2011-12-04: 324 mg via ORAL
  Filled 2011-12-03: qty 4

## 2011-12-03 NOTE — Progress Notes (Signed)
Primary cardiologist: Dr. Dietrich Pates  Patient Name: Alexandra [name] Date of Encounter: 12/03/2011  Active Problems:  Sarcoidosis  DM  VITAMIN D DEFICIENCY  HYPERLIPIDEMIA  ANEMIA  HYPERTENSION  GERD  Irritable bowel syndrome  FIBROMYALGIA  Cerebrovascular disease  Arteriosclerotic cardiovascular disease (ASCVD)  DJD (degenerative joint disease)  Anxiety  Unstable angina  PSVT (paroxysmal supraventricular tachycardia)    SUBJECTIVE: No chest pain, no SOB.   OBJECTIVE Filed Vitals:   12/02/11 2000 12/02/11 2035 12/02/11 2058 12/03/11 0419  BP: 98/34 119/57  112/48  Pulse: 73 75 75 64  Temp: 98.4 F (36.9 C) 97.1 F (36.2 C) 97.1 F (36.2 C) 97.6 F (36.4 C)  TempSrc: Oral Oral Oral Oral  Resp:    19  Height:      Weight:      SpO2: 98% 99%  96%    Intake/Output Summary (Last 24 hours) at 12/03/11 0813 Last data filed at 12/02/11 1239  Gross per 24 hour  Intake    120 ml  Output      0 ml  Net    120 ml   Weight change:  Filed Weights   11/30/11 1108 12/01/11 0546 12/02/11 0040  Weight: 188 lb 7.9 oz (85.5 kg) 188 lb 7.9 oz (85.5 kg) 189 lb 9.5 oz (86 kg)     PHYSICAL EXAM General: Well developed, well nourished, elderly female in no acute distress. Head: Normocephalic, atraumatic.  Neck: Supple without bruits, JVD at about 8 cm. Lungs:  Resp regular and unlabored, some rhonchi. Heart: RRR, S1, S2, no S3, S4, 2-3/6 murmur. Abdomen: Soft, non-tender, non-distended, BS + x 4.  Extremities: No clubbing, cyanosis, no edema.  Neuro: Alert and oriented X 3. Moves all extremities spontaneously. Psych: Normal affect.  LABS: CBC: Basename 12/02/11 0412  WBC 7.6  NEUTROABS --  HGB 9.6*  HCT 28.9*  MCV 87.8  PLT 133*   INR: Basename 12/01/11 0516  INR 1.07   Basic Metabolic Panel: Basename 12/02/11 0412 12/01/11 0516  NA 141 140  K 4.1 4.0  CL 102 98  CO2 26 30  GLUCOSE 125* 117*  BUN 14 20  CREATININE 0.93 1.11*  CALCIUM 8.2* 8.9  MG -- --   PHOS -- --   Cardiac Enzymes: Basename 11/30/11 1948 11/30/11 1408  CKTOTAL 43 54  CKMB 2.1 2.3  CKMBINDEX -- --  TROPONINI <0.30 <0.30    TELE:   SR  Radiology/Studies: Dg Chest Portable 1 View 11/30/2011  *RADIOLOGY REPORT*  Clinical Data: Chest pain  PORTABLE CHEST - 1 VIEW  Comparison: 03/25/2011 and 03/23/2011  Findings: Cardiomediastinal silhouette is stable.  Central mild vascular congestion without pulmonary edema.  Mitral valve calcifications are again noted. No focal infiltrate.  IMPRESSION: Central mild vascular congestion without convincing pulmonary edema.  No focal infiltrate.  Cardiomegaly again noted.  Original Report Authenticated By: Natasha Mead, M.D.    Current Medications:     . amLODipine  2.5 mg Oral QHS  . aspirin EC  81 mg Oral q morning - 10a  . clopidogrel  75 mg Oral QAC breakfast  . ferrous sulfate  325 mg Oral BID  . furosemide  40 mg Oral Daily  . irbesartan  150 mg Oral Daily  . metoprolol  75 mg Oral BID  . pantoprazole  40 mg Oral BID AC  . simvastatin  40 mg Oral q1800  . sodium chloride  3 mL Intravenous Q12H  . vitamin B-12  1,000 mcg Oral  q morning - 10a  . vitamin C  500 mg Oral BID      . DISCONTD: sodium chloride Stopped (12/02/11 0210)    ASSESSMENT AND PLAN: Primary problem:  Unstable angina -  90% focal mid to distal RCA stenosis seen at cath.  Pt had PCI of LAD, needs PCI of RCA, will schedule for am. Pt had questions about need for procedure, answered them, she agrees to stay. Orders written.  Otherwise, continue current Rx: Active Problems:  Sarcoidosis  DM  VITAMIN D DEFICIENCY  HYPERLIPIDEMIA  ANEMIA  HYPERTENSION  GERD  Irritable bowel syndrome  FIBROMYALGIA  Cerebrovascular disease  Arteriosclerotic cardiovascular disease (ASCVD)  DJD (degenerative joint disease)  Anxiety  PSVT (paroxysmal supraventricular tachycardia)  Signed, Theodore Demark , PA-C 8:13 AM 12/03/2011  Attending note:  Patient seen and  examined. She is stable this morning, no recurrent chest pain. Status post cutting balloon angioplasty of the proximal LAD related to in-stent restenosis in the setting of unstable angina. She continues on DAPT for staged intervention to the RCA tomorrow. On examination she is comfortable, afebrile, blood pressure 112/48, heart rate in the 60s. Telemetry with sinus rhythm and occasional PACs and brief atrial runs. Lungs are clear, cardiac exam with regular rate and rhythm and 2/6 systolic murmur at the base, no pitting edema. No followup lab work as yet to review since 7/3. Creatinine was 0.9, hemoglobin 9.6. Continue aspirin, Plavix, Norvasc, Lasix, Lopressor, Zocor. Followup BMET and CBC in a.m.  Jonelle Sidle, M.D., F.A.C.C.

## 2011-12-04 ENCOUNTER — Encounter (HOSPITAL_COMMUNITY): Payer: Self-pay | Admitting: Physician Assistant

## 2011-12-04 DIAGNOSIS — I2510 Atherosclerotic heart disease of native coronary artery without angina pectoris: Secondary | ICD-10-CM

## 2011-12-04 LAB — BASIC METABOLIC PANEL
CO2: 28 mEq/L (ref 19–32)
Calcium: 8.6 mg/dL (ref 8.4–10.5)
Chloride: 100 mEq/L (ref 96–112)
Potassium: 4 mEq/L (ref 3.5–5.1)
Sodium: 139 mEq/L (ref 135–145)

## 2011-12-04 LAB — CBC
HCT: 26.4 % — ABNORMAL LOW (ref 36.0–46.0)
Hemoglobin: 8.7 g/dL — ABNORMAL LOW (ref 12.0–15.0)
MCV: 88.3 fL (ref 78.0–100.0)
Platelets: 122 10*3/uL — ABNORMAL LOW (ref 150–400)
RBC: 2.99 MIL/uL — ABNORMAL LOW (ref 3.87–5.11)
WBC: 6.8 10*3/uL (ref 4.0–10.5)

## 2011-12-04 MED ORDER — PANTOPRAZOLE SODIUM 40 MG PO TBEC: 40.0000 mg | DELAYED_RELEASE_TABLET | Freq: Two times a day (BID) | ORAL | Status: DC

## 2011-12-04 MED ORDER — NITROGLYCERIN 0.4 MG SL SUBL: 0.4000 mg | SUBLINGUAL_TABLET | SUBLINGUAL | Status: DC | PRN

## 2011-12-04 MED ORDER — SIMVASTATIN 40 MG PO TABS: 20.0000 mg | ORAL_TABLET | Freq: Every day | ORAL | Status: DC

## 2011-12-04 NOTE — Progress Notes (Signed)
Patient O2 sats at 94-95 % on room air at rest,  90-91% with ambulation of 500 ft.

## 2011-12-04 NOTE — Progress Notes (Signed)
    SUBJECTIVE: No chest pain this am.   BP 114/59  Pulse 71  Temp 97.4 F (36.3 C) (Oral)  Resp 19  Ht 5\' 6"  (1.676 m)  Wt 189 lb 9.5 oz (86 kg)  BMI 30.60 kg/m2  SpO2 91%  Intake/Output Summary (Last 24 hours) at 12/04/11 0851 Last data filed at 12/04/11 0001  Gross per 24 hour  Intake    480 ml  Output    300 ml  Net    180 ml    PHYSICAL EXAM General: Well developed, well nourished, in no acute distress. Alert and oriented x 3.  Psych:  Good affect, responds appropriately Neck: No JVD. No masses noted.  Lungs: Clear bilaterally with no wheezes or rhonci noted.  Heart: RRR with no murmurs noted. Abdomen: Bowel sounds are present. Soft, non-tender.  Extremities: No lower extremity edema.   LABS: Basic Metabolic Panel:  Basename 12/04/11 0525 12/02/11 0412  NA 139 141  K 4.0 4.1  CL 100 102  CO2 28 26  GLUCOSE 144* 125*  BUN 16 14  CREATININE 1.17* 0.93  CALCIUM 8.6 8.2*  MG -- --  PHOS -- --   CBC:  Basename 12/04/11 0525 12/02/11 0412  WBC 6.8 7.6  NEUTROABS -- --  HGB 8.7* 9.6*  HCT 26.4* 28.9*  MCV 88.3 87.8  PLT 122* 133*    Current Meds:    . amLODipine  2.5 mg Oral QHS  . aspirin  324 mg Oral Pre-Cath  . aspirin EC  81 mg Oral q morning - 10a  . clopidogrel  75 mg Oral QAC breakfast  . ferrous sulfate  325 mg Oral BID  . furosemide  40 mg Oral Daily  . irbesartan  150 mg Oral Daily  . metoprolol  75 mg Oral BID  . pantoprazole  40 mg Oral BID AC  . simvastatin  40 mg Oral q1800  . sodium chloride  3 mL Intravenous Q12H  . vitamin B-12  1,000 mcg Oral q morning - 10a  . vitamin C  500 mg Oral BID     ASSESSMENT AND PLAN:  1. CAD: Admitted with unstable angina and culprit lesion was the in-stent restenosis in the LAD stent which has been treated with cutting balloon angioplasty by Dr. Swaziland with complete resolution of symptoms .  She is known to have moderately severe disease in the RCA which is stable from last year. I have  extensively reviewed the cath from this week and from October 2012. There is no change in the RCA stenosis.  The RCA was not the culprit vessel. Since this is stable, will continue medical management for now. Continue ASA/Plavix/beta blocker/statin. Ambulate this am and d/c home later today if no chest pain.  Follow up with Rothbart 2-3 weeks.      MCALHANY,CHRISTOPHER  7/5/20138:51 AM

## 2011-12-04 NOTE — Discharge Summary (Signed)
Discharge Summary   Patient ID: Alexandra [name] MRN: [id number], DOB/AGE: 02-09-1924 78 y.o. Admit date: 11/30/2011 D/C date:     12/04/2011  Primary Cardiologist: Dr. Daun Peacock  Primary Discharge Diagnoses:  1. CAD with unstable angina this admission - s/p PTCA/cutting balloon to prox LAD for ISR 12/01/11 - residual RCA/Cx dz, for med rx (RCA was not felt to be changed from prior cath) - hx of NSTEMI 03/2011 complicated by pulmonary edema-> DES to subtotal LAD with residual Cx/RCA dz 2. Anemia - for repeat CBC as outpatient 3. H/o frequent palpitations, quiescent  - documented PSVT during stress Myoview 09/2011  Secondary Discharge Diagnoses:  1. Asthmatic bronchitis 2. HTN 3. PVD 4. Fibromyalgia 5. HL 6. DM 7. Obesity 8. GERD 9. Diverticulosis of colon 10. IBS 11. DJD  12. Vitamin D deficiency  13. Anxiety 14. Intermittent vertigo 15. Venous insufficiency 16. Sarcoidosis  Hospital Course: Alexandra [name] is an 78 year old F with hx of CAD s/p NSTEMI 10/12012 with DES to LAD & mod Cx/RCA dz.  She has developed recurring pain in her neck and left shoulder with chest discomfort within the last 6 months. She was scheduled for stress Myoview to evaluate for ischemia back in May and during this test developed supraventricular tachycardia, possibly reentrant mechanism, with retrograde P waves in lead V1. She was symptomatic with chest pain at that time and had abnormal ST-T wave changes. The rhythm spontaneously reverted to normal sinus rhythm without intervention. The perfusion imaging showed a combination of soft tissue attenuation and scar in the anterior septal distribution, with minor ischemia in the apex. EF was 77%. She was continued on medical management and also started on a PPI for possible reflux symptoms. She presented with escalation of symptoms in the last week with substernal CP radiating to her neck, similar to prior MI pain. She denied recent palpitations. She was  admitted by the hospitalist team at [facility] and consulted by cardiology. Initial bloodwork was remarkable for anemia with Hgb 10.5 (11.3 in 07/2011). CXR revealed central mild vascular congestion without convincing pulmonary edema with no focal infiltrate. Cardiac enzymes remained completely negative.  Her symptoms were felt concerning for unstable angina. ECG showed sinus rhythm with frequent PACs, nonspecific ST changes. She was continued on Lovenox, ASA, Plavix, Norvasc, and BB. She was transferred to [facility] on 12/01/11 and underwent cardiac cath by Dr. Shirlee Latch demonstrating Left mainstem: Long vessel with 30% proximal and 40% distal stenosis.  Left anterior descending (LAD): 99% proximal LAD stenosis within stent. 50% mid LAD stenosis. Diffuse mild distal vessel disease.  Left circumflex (LCx): 50% ostial LCx stenosis, 50% mid LCx stenosis prior to large PLOM.  Right coronary artery (RCA): Diffusely diseased RCA. 50% mid RCA stenosis. 90% mid to distal focal stenosis.  Left ventriculography: Hand left ventriculogram done due to elevated LV EDP. EF appeared within normal range, 55-60%. Will need echo eventually.  Final Conclusions: Tight 99% focal proximal LAD stenosis within DES placed in 10/12. 90% focal mid to distal RCA stenosis.  Dr. [place] stepped in to perform PTCA/cutting balloon of the proximal LAD for ISR. She tolerated this procedure well. The was question of possible staged RCA intervention; however, Dr. Clifton James reviewed the cath extensively compared to prior films and felt there was no change in RCA stenosis. He recommended continued med rx for now. Her Hgb has slightly down drifted but there has been no documented evidence of bleeding. Note in 03/2011 her Hgb was also in the  7-8 range with guaiac negative stools at that time. Per discussion with Dr. Clifton James, will continue current plan and obtain f/u CBC early next week. He has seen and examined the patient today and feels she is stable  for discharge.  This admission, she was set up for [facility]. The patient declined HHPT. On 12/02/11 there was documentation of possible desaturations with cardiac rehab, but on day of discharge she is satting 94-95 on RA at rest and 90-91% with ambulation even while talking. Therefore she does not qualify for [facility]. She tolerated ambulation very well without complaint. In regards to med changes this admission, the patient was on BID protonix while in the hospital and this was continued at discharge. Her Zocor was also changed to 20mg  given recommended max dose while on amlodipine. We cannot change to Lipitor due to hx of allergy. She was taken off metformin on admission to the hospital and this will remain on hold given her mildly reduced renal function (CrCl 47) - she was instructed to talk to her PCP about resuming. CBGs without this medicine ranged 120s-140s.  Discharge Vitals: Blood pressure 114/59, pulse 71, temperature 97.4 F (36.3 C), temperature source Oral, resp. rate 19, height 5\' 6"  (1.676 m), weight 189 lb 9.5 oz (86 kg), SpO2 91.00%.  Labs: Lab Results  Component Value Date   WBC 6.8 12/04/2011   HGB 8.7* 12/04/2011   HCT 26.4* 12/04/2011   MCV 88.3 12/04/2011   PLT 122* 12/04/2011    Lab 12/04/11 0525 11/30/11 0811  NA 139 --  K 4.0 --  CL 100 --  CO2 28 --  BUN 16 --  CREATININE 1.17* --  CALCIUM 8.6 --  PROT -- 7.3  BILITOT -- 0.4  ALKPHOS -- 67  ALT -- 8  AST -- 18  GLUCOSE 144* --    Diagnostic Studies/Procedures   1. Chest Portable 1 View 11/30/2011  *RADIOLOGY REPORT*  Clinical Data: Chest pain  PORTABLE CHEST - 1 VIEW  Comparison: 03/25/2011 and 03/23/2011  Findings: Cardiomediastinal silhouette is stable.  Central mild vascular congestion without pulmonary edema.  Mitral valve calcifications are again noted. No focal infiltrate.  IMPRESSION: Central mild vascular congestion without convincing pulmonary edema.  No focal infiltrate.  Cardiomegaly again noted.  Original Report  Authenticated By: Natasha Mead, M.D.   2. Cardiac catheterization this admission, please see full report and above for summary.  Discharge Medications   See above re: med changes.  Medication List  As of 12/04/2011  1:03 PM   STOP taking these medications         metFORMIN 500 MG tablet         TAKE these medications         amitriptyline 10 MG tablet   Commonly known as: ELAVIL   Take 10 mg by mouth daily.      amLODipine 2.5 MG tablet   Commonly known as: NORVASC   Take 2.5 mg by mouth at bedtime.      ascorbic acid 500 MG tablet   Commonly known as: VITAMIN C   Take 500 mg by mouth 2 (two) times daily. Twice daily with iron tablet      aspirin EC 81 MG tablet   Take 81 mg by mouth every morning.      cholecalciferol 1000 UNITS tablet   Commonly known as: VITAMIN D   Take 1,000 Units by mouth daily.      clopidogrel 75 MG tablet   Commonly known as:  PLAVIX   Take 75 mg by mouth every morning.      econazole nitrate 1 % cream   Apply 1 application topically daily as needed. FOR FEET IRRITATION      ferrous sulfate 324 (65 FE) MG Tbec   Take 1 tablet by mouth 2 (two) times daily.      furosemide 40 MG tablet   Commonly known as: LASIX   Take 40 mg by mouth daily.      glucose blood test strip   1 each by Other route as needed. Use as instructed      hydrOXYzine 25 MG tablet   Commonly known as: ATARAX/VISTARIL   Take 25 mg by mouth every 6 (six) hours as needed. For itching or anxiety      irbesartan 150 MG tablet   Commonly known as: AVAPRO   Take 150 mg by mouth daily.      Lanabiotic 5-500-10000 Oint   Apply 1 application topically 2 (two) times daily as needed.      metoprolol 50 MG tablet   Commonly known as: LOPRESSOR   Take 75 mg by mouth 2 (two) times daily. Take  1 1/2 tablets (75 mg ) twice daily      nitroGLYCERIN 0.4 MG SL tablet   Commonly known as: NITROSTAT   Place 0.4 mg under the tongue every 5 (five) minutes x 3 doses as needed.       pantoprazole 40 MG tablet   Commonly known as: PROTONIX   Take 1 tablet (40 mg total) by mouth 2 (two) times daily.      simvastatin 40 MG tablet   Commonly known as: ZOCOR   Take 0.5 tablets (20 mg total) by mouth at bedtime.      TYLENOL 500 MG tablet   Generic drug: acetaminophen   Take 500 mg by mouth every 6 (six) hours as needed.      vitamin B-12 1000 MCG tablet   Commonly known as: CYANOCOBALAMIN   Take 1,000 mcg by mouth every morning.            Disposition   The patient will be discharged in stable condition to home. Discharge Orders    Future Appointments: Provider: Department: Dept Phone: Center:   01/06/2012 12:00 PM Vickki Hearing, MD [facility] [phone] [facility]   02/23/2012 11:30 AM [name], MD [facility] [phone] None     Future Orders Please Complete By Expires   Diet - low sodium heart healthy      Comments:   Diabetic Diet   Increase activity slowly      Comments:   No lifting over 5 lbs for 1 week. No sexual activity for 1 week. No driving for 2 days. Keep procedure site clean & dry. If you notice increased pain, swelling, bleeding or pus, call/return!  You may shower, but no soaking baths/hot tubs/pools for 1 week.   Discharge instructions      Comments:   Please contact your primary doctor about your diabetes medicine -- your metformin was on hold while in the hospital and will stay on hold for now because of mildly reduced kidney function.     Follow-up Information    Follow up with [place] [facility], MD. (Our office will call you with a follow-up appointment)    Contact information:   [address] [place] [address] [phone]       Follow up with Home Health. (Please have  home health nurse draw labs (CBC) on Monday 12/07/11, results to be sent to Dr. Marvel Plan office.)           Duration of Discharge Encounter: Greater than 30 minutes including physician and PA time.  Signed, Dayna Dunn  PA-C 12/04/2011, 1:03 PM

## 2011-12-04 NOTE — Progress Notes (Signed)
Pt education completed, D/C IV sites, monitor, and O2. Pt states no questions, no needs. Pt d/c via w/c to private vehicle

## 2011-12-04 NOTE — Discharge Summary (Signed)
See full note this am. cdm 

## 2011-12-07 ENCOUNTER — Ambulatory Visit (INDEPENDENT_AMBULATORY_CARE_PROVIDER_SITE_OTHER): Payer: Medicare Other | Admitting: Pulmonary Disease

## 2011-12-07 ENCOUNTER — Encounter: Payer: Self-pay | Admitting: Pulmonary Disease

## 2011-12-07 VITALS — BP 124/60 | HR 63 | Temp 97.0°F | Ht 66.0 in | Wt 191.6 lb

## 2011-12-07 DIAGNOSIS — E559 Vitamin D deficiency, unspecified: Secondary | ICD-10-CM

## 2011-12-07 DIAGNOSIS — I2510 Atherosclerotic heart disease of native coronary artery without angina pectoris: Secondary | ICD-10-CM

## 2011-12-07 DIAGNOSIS — I872 Venous insufficiency (chronic) (peripheral): Secondary | ICD-10-CM | POA: Insufficient documentation

## 2011-12-07 DIAGNOSIS — I7090 Unspecified atherosclerosis: Secondary | ICD-10-CM

## 2011-12-07 DIAGNOSIS — M1990 Unspecified osteoarthritis, unspecified site: Secondary | ICD-10-CM

## 2011-12-07 DIAGNOSIS — E663 Overweight: Secondary | ICD-10-CM

## 2011-12-07 DIAGNOSIS — E119 Type 2 diabetes mellitus without complications: Secondary | ICD-10-CM

## 2011-12-07 DIAGNOSIS — F419 Anxiety disorder, unspecified: Secondary | ICD-10-CM

## 2011-12-07 DIAGNOSIS — I679 Cerebrovascular disease, unspecified: Secondary | ICD-10-CM

## 2011-12-07 DIAGNOSIS — K219 Gastro-esophageal reflux disease without esophagitis: Secondary | ICD-10-CM

## 2011-12-07 DIAGNOSIS — D649 Anemia, unspecified: Secondary | ICD-10-CM

## 2011-12-07 DIAGNOSIS — I10 Essential (primary) hypertension: Secondary | ICD-10-CM

## 2011-12-07 DIAGNOSIS — R609 Edema, unspecified: Secondary | ICD-10-CM

## 2011-12-07 DIAGNOSIS — E785 Hyperlipidemia, unspecified: Secondary | ICD-10-CM

## 2011-12-07 DIAGNOSIS — K589 Irritable bowel syndrome without diarrhea: Secondary | ICD-10-CM

## 2011-12-07 MED ORDER — METFORMIN HCL 500 MG PO TABS: 500.0000 mg | ORAL_TABLET | Freq: Every day | ORAL | Status: DC

## 2011-12-07 MED ORDER — LORAZEPAM 0.5 MG PO TABS: ORAL_TABLET | ORAL | Status: DC

## 2011-12-07 NOTE — Patient Instructions (Addendum)
Today we updated your med list in our EPIC system...    Continue your current medications the same...  We decided to RESTART your METFORMIN at a lower dose> one 500mg  tab in the AM only... We also wrote a NEW prescription for LORAZEPAM 0.5mg - 1/2 to 1 tab up to 3 times daily as needed for nerves...  Be sure to avoid all salt/ sodium & keep the LASIX at 40mg  each AM for now...  Let's plan a follow up visit in 6 weeks.Marland KitchenMarland Kitchen

## 2011-12-07 NOTE — Progress Notes (Signed)
Subjective:    Patient ID: Alexandra [name], female    DOB: 1924-03-19, 78 y.o.   MRN: [id number]  HPI 78 y/o WF here for a 4 month follow up visit... he has multiple medical problems including AB, hx inactive Sarcoid, HBP, PVD, VI, Hyperlipidemia, DM, Obesity, GERD/ Divertics/ IBS, DJD/ FM/ osteop, Anxiety, Anemia...  ~  March 19, 2010:  4 mo ROV & she notes stable- Hydroxyzine helps "throat" episodes that occur w/ anxiety; no intercurrent infections;  BP stable on meds;  still too sedentary & not effectively dieting (wt w/o change);  she had BMD 6/11= osteoporotic in left FemNeck & Alendronate recommended to her but she flat out refuses to take the Bisphos Rx "I'll take my chances"...  ~  July 22, 2010:  620mo ROV & weight is down 13# on diet;  she's had occas episodes of palpit, heart racing & notes "throat closing" sensation when this occurs, noted w/ activity & valsalva maneuver eg bending down & assoc w/ SOB but denies CP;  EKG w/ atrial arrhythmias/ PACs & we discussed need for further Cards eval- she requests the [facility] office;  she is currently on ASA, Plavix, Metoprolol 25mg Bid, Avapro 150mg /d, Lasix 40mg /d;  BP well controlled on this...    She has hx old burnt-out Sarcoid> yearly CXR today shows mild cardiomeg, tort Ao, chr incr markings, NAD;  denies cough, sput, chr in her baseline SOB/ DOE, etc...    Her FLP looks good on Simva40;  DM control is excellent on Metformin Bid;  weight down to 190# as noted;  GI appears stable & is followed in [place] now;  Ortho per DrSHarrison in [place] w/ shots every 38mo or so!;  hx Anemia & Hg improved to 11.6 today, Fe=51, Sat=16%, B12 is high;  she uses Hydroxyzine (Vistaril) 25mg  Prn for nerves...  ~  November 18, 2010:  620mo ROV & she seems more anxious & sl more agitated than prev, also weaker, more feeble (ie couldn't step up onto the exam table); she is concerned for her cardiac status> DrHochrein did Holter monitor w/ PACs/ PVCs & no dangerous  arrhythmias & no definite AFib seen; CDopplers ordered for ?CBruit but I cannot find the results in EPIC;  He increased her Metoprolol to 50mg Bid, and she increased her ASA to 81mg Bid as well... She notes that her HYDROXYZINE (Vistaril) helps more than anything & I mentioned it was OK to take this regularly;  See prob list below>>  ~  August 25, 2011:  71mo ROV & we reviewed her interval developments & she continues to f/u w/ DrRothbart regularly...    She had nonQ-MI 10/12 & cath showed severe 3 vessel CAD w/ PTCA/ stent placed in LAD (99% occluded) + med rx w/ Plavix added; there was AK anteriorly but LVF was preserved; DrRothbart did a Myoview scan 3/13 w/ SVT developing after the infusion w/ CP & abn STsegment changes; she spont reverted to NSR w/o need for intervention; Myoview images showed scar anteroseptal area + soft tissue attenuation & minor ischemia at the apex & EF=77%;  They incr her Metoprolol to 75mg Bid...    She saw DrHarrison in [place] 1/13 for a left knee injection. LABS 3/13:  FLP- at goals on Simva40 x TG=157;  Chems- ok x BS=126 A1c=6.0 on Metform;  CBC- Hg=11.3;  TSH=4.11;  BNP=309  ~  December 07, 2011:  620mo ROV & Alexandra [name] was Adm 7/1 - 12/04/11 w/ unstable angina (pain involved her neck & throat,  then across her chest to arms) and cath showed another 99% focal proxLAD stenosis within the prev stent & mod RCA & Circ disease (including a 90% mid-distal RCA lesion- felt too difficult to intervene); she had a PTCA/ cutting balloon for the LAD in stent restenosis; continue ASA/ Plavix; continue other meds including Metoprolol, Amlodipine, Avapro, Lasix, Simvastatin...  She was also noted to be ANEMIC w/ Hg in the 8-9 range, stools reported neg & she is on FeSO4;  Renal function is borderline w/ Creat in the 0.9-1.2 range & GFR 40-50 range (her Metformin was held in hosp & review of DM control has been good on MetformBid so we decided to restart at just 500mg  Qam)... Finally she notes that "my  nerves are shot" and we will rec Lorazepam 0.5mg - 1/2 to 1 tab Tid as needed...    We reviewed prob list, meds, xrays and labs> see below for updates>> CXR 7/13 showed cardiomegaly & mild vasc congestion... LABS 7/13:  Chems- BS=144 BUN=16 Creat=1.17;  CBC- Hg=8.7   Problem List:  Hx of ASTHMATIC BRONCHITIS, ACUTE (ICD-466.0) - uses Mucinex as needed... she has chr stable DOE w/o change... she say she walks "some" but is obviously too sedentary due to her arthritis... she has PNEUMOVAX several yrs ago.  Hx of SARCOIDOSIS (ICD-135) - initially diagnosed in 1992 w/ CXR showing diffuse ILDis, +Gallium scan, ACE=61, & Bronch w/ bx showing non-caseating granulomas... treated w/ Pred & weaned off w/ no active disease since then... last CTChest 7/04 w/ ca++ Ao & coronaries, +mediastinal fat, centilobular emphysema, some scarring... stable without new symptoms of cough, sputum, CP or dyspnea... ~  CXR 1/09 showed stable chr lung changes & prom right hilum... ~  CXR 2/11 showed diffuse ILDz, no adenopathy, NAD.[name]. ~  CXR 2/12 showed borderline cardiomeg, tort Ao, sl incr markings as before, NAD.[name]. ~  CXR 10/12 in hosp showed cardiomeg, calcif in Ao, vasc congestion w/ pulm edema & sm effusions... ~  CXR 7/13 in hosp showed cardiomegaly & mild vasc congestion...  HYPERTENSION (ICD-401.9) - controlled on meds: METOPROLOL 50mg - 1.5 tabsBid, AMLODIPINE 2.5mg /d, AVAPRO 150mg /d, LASIX 40mg /d... ~  Myoview 12/05 was neg- no ischemia, no infarct... ~  EKG 2/11 showed NSR, rate 82/min, WNL.[name]. ~  4/12:  DrHochrein increased her Metoprolol from 25Bid to 50Bid for her BP & ectopy... ~  6/12:  BP= 136/74 & denies HA, visual changes, CP, palipit, syncope, change in dyspnea, edema, etc... ~  3/13:  BP= 124/70 & she is feeling well, denies CP/ SOB/ ch in edema/ etc... ~  7/13:  BP= 124/60 & these meds were not changed during her recent hosp for angina w/ cath & PCI for LAD in-stent restenosis...  PALPITATIONS &  Ventric Ectopy>  Eval & Rx by DrHochrein, his notes are reviewed... On METOPROLOL 75mg Bid & she uses HYDROXYZINE (Vistaril) 25mg  Prn which she says is helpful... ~  Event Monitor done 4/12 and no definite AFib encountered, just PVCs...   CORONARY ARTERY DISEASE >> on ASA 81mg /d & PLAVIX 75mg /d; plus the meds above & followed by DrRothbart... ~  She had nonQ-MI 10/12 & cath showed severe 3 vessel CAD w/ PTCA/ stent placed in LAD (99% occluded) + med rx w/ Plavix added; there was AK anteriorly but LVF was preserved... ~  EKG 11/12 showed NSR, rate85, PACs, septal infarct, otherw neg... ~  DrRothbart did a Myoview scan 3/13 w/ SVT developing after the infusion w/ CP & abn STsegment changes; she spont reverted to  NSR w/o need for intervention; Myoview images showed scar anteroseptal area + soft tissue attenuation & minor ischemia at the apex & EF=77%; They incr her Metoprolol to 75mg Bid... ~  7/13:  She was [facility] for unstable angina w/ cath showing 99% prox LAD in-stent restenosis- PCI w/ cutting ballon & improved; she had mod dis in RCA & Circ (including worsening 90% distal RCA stenosis- but intervention was r/o)...  PERIPHERAL VASCULAR DISEASE (ICD-443.9) - on ASA 81mg /d, PLAVIX 75mg /d... doing well w/o claud symptoms... ~  Art Dopplers of LE's 6/06 showed biphasic waveforms in ant tib & peroneal arts, normal ABI's... ~  CDopplers 5/12 showed mild heterogeneous plaque bilat w/ 40-59% bilat ICA stenoses... ~  CDopplers 4/13 showed mild to mod plaque bilat, 50-69% bilat ICA stenoses (lower end but R>L)...  VENOUS INSUFFICIENCY (ICD-459.81) - on low sodium diet and the Lasix... she knows to elevate legs, wear support hose, etc... ~  7/13:  She has persistent bilat LE edema & weight is similar 192# on LASIX 40mg /d   HYPERLIPIDEMIA (ICD-272.4) - on SIMVASTATIN 40mg /d... ~  FLP 1/08 showed TChol 121, TG 120, HDL 46, LDL 51... looks great- continue Simva40. ~  FLP 1/09 showed TChol 137, TG 165, HDL 37, LDL  67 ~  FLP 9/09 showed TChol 133, TG 141, HDL 47, LDL 57 ~  FLP 5/10 showed TChol 131, TG 190, HDL 46, LDL 47 ~  FLP 2/11 showed TChol 129, TG 129, HDL 52, LDL 51 ~  FLP 2/12 showed TChol 134, TG 159, HDL 49, LDL 53 ~  FLP 3/13 on Simva40 showed TChol 112, TG 157, HDL 46, LDL 35... Continue same + better low fat diet.  DM (ICD-250.00) - on diet + METFORMIN 500mg - 1Bid... unable to exercise or lose wt...  ~  labs 1/08 showed BS=130 and HgA1c=6.1 ~  labs 1/09 showed BS= 137, HgA1c= 6.2... diet and exercise are the keys!!! ~  labs 9/09 showed BS= 119, HgA1c= 6.3 ~  labs 5/10 showed BS= 134, A1c= 6.3 ~  labs 2/11 showed BS= 122, A1c= 6.3 ~  labs 6/11 showed BS= 106 ~  Labs 2/12 showed BS= 138, A1c= 6.3 ~  Labs 3/13 on Metform500Bid showed BS= 126, A1c= 6.0.Marland KitchenMarland Kitchen Continue same, get wt down! ~  7/13:  Metformin was held 7/13 Hosp w/ Creat= 0.9-1.2 & GFR= 40-50 range; we decided to restart METFORMIN 500mg  Qam only...  MORBID OBESITY (ICD-278.01) - prev wt 227#  & 5\' 2"  tall for a BMI=41-42...  ~  weight 1/10 = 217# ~  weight 5/10 = 215# ~  weight 9/10 = 210# ~  weight 2/11 = 210# ~  weight 6/11 = 203# ~  weight 10/11 = 203# ~  Weight 6/12 = 195# ~  Weight 3/13 = 192# ~  Weight 7/13 = 192#  GERD (ICD-530.81) - pt was placed on PROTONIX 40mg Bid 7/13 due to reflux symptoms and Anemia... ~  She is encouraged to f/u w/ GI & she indicates that she will call DrRehman when she is ready...  DIVERTICULOSIS, COLON (ICD-562.10) - had colonoscopy by DrDBrodie 1997 that was neg x divertics... now followed in [place]...  IRRITABLE BOWEL SYNDROME (ICD-564.1)  DEGENERATIVE JOINT DISEASE, ADVANCED (ICD-715.90) - she has mod severe bilat knee pain and sees DrHarrison, Ortho for shots "every three months"... she is opposed to surgery (see his EMR notes- reviewed)...  VITAMIN D DEFICIENCY (ICD-268.9) ~  labs 5/10 showed Vit D level =13... therefore rec Vit D 50000 u weekly. ~  labs 2/11 showed Vit D  level = 32... OK to change to VitD 1000 u daily.. ~  BMD done 6/11 showed TScores -1.3 in Spine, and -2.5 in left FemNeck... rec> start Alendronate 70mg /wk but she refuses Bisphos Rx "I'll take my chances"...  FIBROMYALGIA (ICD-729.1)  ANXIETY (ICD-300.00) - we prev perscribed HYDROXYZINE PAMOATE 25mg  Prn for "throat closing" symptoms... ~  7/13:  She admits that "my nerves are shot" after recent [facility] & Rx LORAZEPAM 0.5mg - 1/2 to 1 tab Tid as needed...  ANEMIA (ICD-285.9) - hx mild anemia w/ Hg = 10.8 Jan09 & started Fe daily at that time...  ~  labs 5/09 showed Hg= 11.7, Fe= 64 ~  labs 9/09 showed Hg= 11.4, Fe= 43... rec- take FeSO4 w/ Vit C 500mg /d. ~  labs 5/10 showed Hg= 11.3,  Fe= 50 ~  labs 2/11 showed Hg= 10.7, Fe= 36 (12%)... rec> incr Fe Vit C to Bid. ~  labs 6/11 showed Hg= 10.9, Fe= 58 (19%sat)... continue Bid Fe ~  Labs 2/12 showed Hg= 11.6, Fe= 51 (16%sat)... Continue same. ~  Labs 3/13 showed Hg= 11.3.Marland KitchenMarland Kitchen Continue FeSO4 Bid... ~  Labs during 7/13 Hosp showed Hg= 8-9 range... rec to continue FeSO4 Bid...  INTERMITTENT VERTIGO (ICD-780.4) - prev eval DrWeymann w/ Meclizine Prn...   Past Surgical History  Procedure Date  . Cholecystectomy   . Tonsillectomy   . Hernia repair   . Cataract extraction     Outpatient Encounter Prescriptions as of 12/07/2011  Medication Sig Dispense Refill  . acetaminophen (TYLENOL) 500 MG tablet Take 500 mg by mouth every 6 (six) hours as needed.        [name] amitriptyline (ELAVIL) 10 MG tablet Take 10 mg by mouth daily.      [name] amLODipine (NORVASC) 2.5 MG tablet Take 2.5 mg by mouth at bedtime.      [name] ascorbic acid (VITAMIN C) 500 MG tablet Take 500 mg by mouth 2 (two) times daily. Twice daily with iron tablet      . aspirin EC 81 MG tablet Take 81 mg by mouth every morning.       . cholecalciferol (VITAMIN D) 1000 UNITS tablet Take 1,000 Units by mouth daily.      . clopidogrel (PLAVIX) 75 MG tablet Take 75 mg by mouth every morning.      [name]  econazole nitrate 1 % cream Apply 1 application topically daily as needed. FOR FEET IRRITATION      . ferrous sulfate 324 (65 FE) MG TBEC Take 1 tablet by mouth 2 (two) times daily.       . furosemide (LASIX) 40 MG tablet Take 40 mg by mouth daily.      [name] glucose blood test strip 1 each by Other route as needed. Use as instructed       . hydrOXYzine (ATARAX/VISTARIL) 25 MG tablet Take 25 mg by mouth every 6 (six) hours as needed. For itching or anxiety      . irbesartan (AVAPRO) 150 MG tablet Take 150 mg by mouth daily.      . metoprolol (LOPRESSOR) 50 MG tablet Take  1 1/2 tablets (75 mg ) twice daily      . nitroGLYCERIN (NITROSTAT) 0.4 MG SL tablet Place 1 tablet (0.4 mg total) under the tongue every 5 (five) minutes x 3 doses as needed.      . pantoprazole (PROTONIX) 40 MG tablet Take 1 tablet (40 mg total) by mouth 2 (two) times daily.      [name]  simvastatin (ZOCOR) 40 MG tablet Take 40 mg by mouth at bedtime.      . vitamin B-12 (CYANOCOBALAMIN) 1000 MCG tablet Take 1,000 mcg by mouth every morning.      [name] DISCONTD: simvastatin (ZOCOR) 40 MG tablet Take 0.5 tablets (20 mg total) by mouth at bedtime.      [name] LORazepam (ATIVAN) 0.5 MG tablet Take 1/2 to 1 tablet by mouth three times daily as needed for nerves  90 tablet  5  . metFORMIN (GLUCOPHAGE) 500 MG tablet Take 1 tablet (500 mg total) by mouth daily with breakfast.  30 tablet  6  . DISCONTD: Neomycin-Bacitracin-Polymyxin (LANABIOTIC) 5-500-10000 OINT Apply 1 application topically 2 (two) times daily as needed.        Allergies  Allergen Reactions  . Codeine Other (See Comments)    Reaction: severe GI pain  . Diazepam Other (See Comments)    REACTION: causes pt unable to wake up after taking  . Lipitor (Atorvastatin Calcium) Other (See Comments)    Reaction: causes vertigo  . Penicillins Other (See Comments)    Reaction: unknown (childhood )  . Sulfonamide Derivatives Other (See Comments)    Reaction: unknown (childhood)    Current  Medications, Allergies, Past Medical History, Past Surgical History, Family History, and Social History were reviewed in [employer] record.   Review of Systems        See HPI - all other systems neg except as noted... The patient complains of decreased hearing, dyspnea on exertion, peripheral edema, muscle weakness, and difficulty walking.  The patient denies anorexia, fever, weight loss, weight gain, vision loss, hoarseness, chest pain, syncope, prolonged cough, headaches, hemoptysis, abdominal pain, melena, hematochezia, severe indigestion/heartburn, hematuria, incontinence, suspicious skin lesions, transient blindness, depression, unusual weight change, abnormal bleeding, enlarged lymph nodes, and angioedema.   Objective:   Physical Exam      WD, Obese, 78 y/o WF in NAD... GENERAL:  Alert & oriented; pleasant & cooperative... she is very talkative... HEENT:  [place]/AT, Glasses, EOM-full, PERRLA, TMs-wax, NOSE-clear, THROAT-clear & wnl. NECK:  Supple w/ fairROM; no JVD; no carotid bruits; no thyromegaly or nodules palpated; no lymphadenopathy. CHEST:  decr BS bilat but clear w/o wheezing, rales or signs of consolidation... HEART:   sl irregular rhythm; without murmurs/ rubs/ or gallops detected... ABDOMEN:  Obese w/ panniculus, soft & nontender; normal bowel sounds; no organomegaly or masses palpated. EXT:  mod arthritic changes; no varicose veins/ +venous insuffic/ tr+edema bilat NEURO:  CN's intact, no focal neuro deficits... DERM:  few ecchymoses, no rash etc...  RADIOLOGY DATA:  Reviewed in the EPIC EMR & discussed w/ the patient...  LABORATORY DATA:  Reviewed in the EPIC EMR & discussed w/ the patient...   Assessment & Plan:   CAD>  Adm 7/13 w/ unstable angina & had LAD in-stent restenosis, another PCI & improved (see above); disch on similar med regimen as noted...   AB>  Breathing is stable w/o exac...  Hx Sarcoid>  CXR w/ chr changes, NAD...  HBP>   Controlled on meds, continue same, take regularly...  Palpit>  On going eval & med adjustments from DrHochrein; better on the ince Metoprolol but notes that the Vistaril really helps...  PVD>  On ASA & Plavix w/o cerebral ischemic symptoms; CDoppler 4/13 w/ sl progression- continue meds...  Ven Insuffic/ Edema>  We reviewed the nec sodium restriction and continue the Lasix40...  HYPERLIPID>  Stable on the Simva40...  DM>  Metform was held in  Hosp & we will restart at lower dose 500mg  Qam...  GI>  GERD, Divertics, IBS>  Stable, continue Protonix40Bid...  DJD>  Followed by DrHarrison in [place]...  Anxiety>  As noted Lorazepam 0.5mg  prescribed...   Patient's Medications  New Prescriptions   LORAZEPAM (ATIVAN) 0.5 MG TABLET    Take 1/2 to 1 tablet by mouth three times daily as needed for nerves   METFORMIN (GLUCOPHAGE) 500 MG TABLET    Take 1 tablet (500 mg total) by mouth daily with breakfast.  Previous Medications   ACETAMINOPHEN (TYLENOL) 500 MG TABLET    Take 500 mg by mouth every 6 (six) hours as needed.     AMITRIPTYLINE (ELAVIL) 10 MG TABLET    Take 10 mg by mouth daily.   AMLODIPINE (NORVASC) 2.5 MG TABLET    Take 2.5 mg by mouth at bedtime.   ASCORBIC ACID (VITAMIN C) 500 MG TABLET    Take 500 mg by mouth 2 (two) times daily. Twice daily with iron tablet   ASPIRIN EC 81 MG TABLET    Take 81 mg by mouth every morning.    CHOLECALCIFEROL (VITAMIN D) 1000 UNITS TABLET    Take 1,000 Units by mouth daily.   CLOPIDOGREL (PLAVIX) 75 MG TABLET    Take 75 mg by mouth every morning.   ECONAZOLE NITRATE 1 % CREAM    Apply 1 application topically daily as needed. FOR FEET IRRITATION   FERROUS SULFATE 324 (65 FE) MG TBEC    Take 1 tablet by mouth 2 (two) times daily.    FUROSEMIDE (LASIX) 40 MG TABLET    Take 40 mg by mouth daily.   GLUCOSE BLOOD TEST STRIP    1 each by Other route as needed. Use as instructed    HYDROXYZINE (ATARAX/VISTARIL) 25 MG TABLET    Take 25 mg by mouth every 6 (six)  hours as needed. For itching or anxiety   IRBESARTAN (AVAPRO) 150 MG TABLET    Take 150 mg by mouth daily.   METOPROLOL (LOPRESSOR) 50 MG TABLET    Take  1 1/2 tablets (75 mg ) twice daily   NITROGLYCERIN (NITROSTAT) 0.4 MG SL TABLET    Place 1 tablet (0.4 mg total) under the tongue every 5 (five) minutes x 3 doses as needed.   PANTOPRAZOLE (PROTONIX) 40 MG TABLET    Take 1 tablet (40 mg total) by mouth 2 (two) times daily.   VITAMIN B-12 (CYANOCOBALAMIN) 1000 MCG TABLET    Take 1,000 mcg by mouth every morning.  Modified Medications   Modified Medication Previous Medication   SIMVASTATIN (ZOCOR) 40 MG TABLET simvastatin (ZOCOR) 40 MG tablet      Take 40 mg by mouth at bedtime.    Take 0.5 tablets (20 mg total) by mouth at bedtime.  Discontinued Medications   NEOMYCIN-BACITRACIN-POLYMYXIN (LANABIOTIC) 5-500-10000 OINT    Apply 1 application topically 2 (two) times daily as needed.

## 2011-12-14 ENCOUNTER — Telehealth: Payer: Self-pay | Admitting: Adult Health

## 2011-12-14 NOTE — Telephone Encounter (Signed)
PT NEEDS NEW RX , DOSE WAS INCREASED/TMJ

## 2011-12-14 NOTE — Telephone Encounter (Signed)
PT ALSO STATES HOME HEALTH WAS OUT ON 12/07/11. THEY DREW LABS AND SHE HAS NOT HEARD BACK FROM THEM. SHE WAS TOLD THEY WOULD TURN RESULTS IN TO [place] AND WE WOULD CALL HER WITH THEM.

## 2011-12-14 NOTE — Telephone Encounter (Signed)
Phone number is busy, will continue to call./LV

## 2011-12-15 MED ORDER — PANTOPRAZOLE SODIUM 40 MG PO TBEC: 40.0000 mg | DELAYED_RELEASE_TABLET | Freq: Two times a day (BID) | ORAL | Status: DC

## 2011-12-15 NOTE — Telephone Encounter (Signed)
Misty Stanley, [facility] supervisor with [facility], states that she will fax CBC results that were drawn on 7-8 to this office. RX for increased Protonix sent to [facility] in [place] to fill and deliver./LV

## 2011-12-21 ENCOUNTER — Encounter: Payer: Self-pay | Admitting: Adult Health

## 2011-12-22 ENCOUNTER — Encounter: Payer: Medicare Other | Admitting: Adult Health

## 2011-12-23 ENCOUNTER — Ambulatory Visit (INDEPENDENT_AMBULATORY_CARE_PROVIDER_SITE_OTHER): Payer: Medicare Other | Admitting: Adult Health

## 2011-12-23 ENCOUNTER — Encounter: Payer: Self-pay | Admitting: Adult Health

## 2011-12-23 VITALS — BP 121/58 | HR 66 | Ht 66.0 in | Wt 184.0 lb

## 2011-12-23 DIAGNOSIS — I10 Essential (primary) hypertension: Secondary | ICD-10-CM

## 2011-12-23 DIAGNOSIS — I7090 Unspecified atherosclerosis: Secondary | ICD-10-CM

## 2011-12-23 DIAGNOSIS — I2510 Atherosclerotic heart disease of native coronary artery without angina pectoris: Secondary | ICD-10-CM

## 2011-12-23 DIAGNOSIS — E785 Hyperlipidemia, unspecified: Secondary | ICD-10-CM

## 2011-12-23 NOTE — Progress Notes (Signed)
HPI: Alexandra [name] is a 78 y/o patient of Dr. Dietrich Pates he follows for ongoing assessment and treatment of Palpitations with known history of CAD.[name] Event recording documented frequent PACs without significant arrhythmias. She suffered a non-Q myocardial infarction in 03/2011 and was found to have severe three-vessel coronary disease. A culprit subtotal lesion in the LAD was stented with medical therapy advised for moderate lesions in the circumflex and RCA. Dr. Dietrich Pates scheduled her for stress myoview to evaluate her HR response to exercise and for ischemia. Tests were negative for ischemia.   She was admitted to [facility] hospital after evaluation by [place] at [facility] for recurrent chest pain. She underwent cardiac cath by Dr. Shirlee Latch a tight 99% proximal LAD stenosis within the stent, 50% mid LAD stenosis, She also had 90% mid to distal RCA focal stenosis.  A PTCA./cutting balloon of the proximal LAD for ISR. Films were also reviewed by Dr. Clifton James on need to intervene on RCA stenosis. This was deferred with plans to be treated medically.    She was also taken off of metformin and has followed up with her PCP, Dr. Kriste Basque, for diabetes management. She is feeling much better and is without complaint of chest discomfort, burping, or heart burn symptoms. She has lost 9 lbs. She is back to her usual state of energy.     Allergies  Allergen Reactions  . Codeine Other (See Comments)    Reaction: severe GI pain  . Diazepam Other (See Comments)    REACTION: causes pt unable to wake up after taking  . Lipitor (Atorvastatin Calcium) Other (See Comments)    Reaction: causes vertigo  . Penicillins Other (See Comments)    Reaction: unknown (childhood )  . Sulfonamide Derivatives Other (See Comments)    Reaction: unknown (childhood)    Current Outpatient Prescriptions  Medication Sig Dispense Refill  . acetaminophen (TYLENOL) 500 MG tablet Take 500 mg by mouth every 6 (six) hours as needed.        [name] amitriptyline  (ELAVIL) 10 MG tablet Take 10 mg by mouth daily.      [name] amLODipine (NORVASC) 2.5 MG tablet Take 2.5 mg by mouth at bedtime.      [name] ascorbic acid (VITAMIN C) 500 MG tablet Take 500 mg by mouth 2 (two) times daily. Twice daily with iron tablet      . aspirin EC 81 MG tablet Take 81 mg by mouth every morning.       . cholecalciferol (VITAMIN D) 1000 UNITS tablet Take 1,000 Units by mouth daily.      . clopidogrel (PLAVIX) 75 MG tablet Take 75 mg by mouth every morning.      [name] econazole nitrate 1 % cream Apply 1 application topically daily as needed. FOR FEET IRRITATION      . ferrous sulfate 324 (65 FE) MG TBEC Take 1 tablet by mouth 2 (two) times daily.       . furosemide (LASIX) 40 MG tablet Take 40 mg by mouth daily.      . hydrOXYzine (ATARAX/VISTARIL) 25 MG tablet Take 25 mg by mouth every 6 (six) hours as needed. For itching or anxiety      . irbesartan (AVAPRO) 150 MG tablet Take 150 mg by mouth daily.      . meclizine (ANTIVERT) 25 MG tablet Take 25 mg by mouth 3 (three) times daily as needed.      . metFORMIN (GLUCOPHAGE) 500 MG tablet Take 500 mg by mouth daily with  breakfast.      . metoprolol (LOPRESSOR) 50 MG tablet Take  1 1/2 tablets (75 mg ) twice daily      . nitroGLYCERIN (NITROSTAT) 0.4 MG SL tablet Place 1 tablet (0.4 mg total) under the tongue every 5 (five) minutes x 3 doses as needed.      . pantoprazole (PROTONIX) 40 MG tablet Take 1 tablet (40 mg total) by mouth 2 (two) times daily.  60 tablet  6  . simvastatin (ZOCOR) 40 MG tablet Take 40 mg by mouth at bedtime.      . vitamin B-12 (CYANOCOBALAMIN) 1000 MCG tablet Take 1,000 mcg by mouth every morning.      [name] glucose blood test strip 1 each by Other route as needed. Use as instructed       . LORazepam (ATIVAN) 0.5 MG tablet Take 1/2 to 1 tablet by mouth three times daily as needed for nerves  90 tablet  5    Past Medical History  Diagnosis Date  . Asthmatic bronchitis   . Sarcoidosis   . HTN (hypertension)   . PVD  (peripheral vascular disease)   . CAD (coronary artery disease) 03/2011    a. NSTEMI 03/2011 complicated by pulm edema s/p DES to subtotal LAD. b. [place] s/p PTCA/cutting balloon to prox LAD for ISR 12/01/11 (med rx for RCA/Cx dz).   . Hyperlipemia   . DM (diabetes mellitus)   . Obesity   . GERD (gastroesophageal reflux disease)   . Diverticulosis of colon   . IBS (irritable bowel syndrome)   . DJD (degenerative joint disease)     Hip and knee pain; hip bursitis; patellar subluxation  . Vitamin D deficiency disease   . Fibromyalgia   . Anxiety   . Intermittent vertigo   . Anemia   . Venous insufficiency   . PSVT (paroxysmal supraventricular tachycardia)     Prior hx of palpitations - developed SVT during stress Myoview 09/2011, spontaneously reverted back to NSR    Past Surgical History  Procedure Date  . Cholecystectomy   . Tonsillectomy   . Hernia repair   . Cataract extraction     [id number]:[id number] of systems complete and found to be negative unless listed above  PHYSICAL EXAM BP 121/58  Pulse 66  Ht 5\' 6"  (1.676 m)  Wt 184 lb (83.462 kg)  BMI 29.70 kg/m2  General: Well developed, well nourished, in no acute distress, anxious. Head: Eyes PERRLA, No xanthomas.   Normal cephalic and atramatic  Lungs: Clear bilaterally to auscultation and percussion. Heart: HRRR S1 S2, without MRG.  Pulses are 2+ & equal.            No carotid bruit. No JVD.  No abdominal bruits. No femoral bruits. Abdomen: Bowel sounds are positive, abdomen soft and non-tender without masses or                  Hernia's noted. Msk:  Back normal, normal gait. Normal strength and tone for age. Extremities: No clubbing, cyanosis or edema.  DP +1 Neuro: Alert and oriented X 3. Psych:  Good affect, responds appropriately    ASSESSMENT AND PLAN

## 2011-12-23 NOTE — Assessment & Plan Note (Signed)
She is feeling much better and back to her usual state of health and energy level. She is feisty and talkative. She has no complaints of bleeding or bruising from DAPT. She will continue on BID protonix regimen. We will see her again in 6 months unless she is symptomatic.

## 2011-12-23 NOTE — Assessment & Plan Note (Signed)
She is due for follow up labs next month per PCP.

## 2011-12-23 NOTE — Assessment & Plan Note (Signed)
Well controlled on current medications. She is medically compliant. She is due for labs with Dr. Kriste Basque next month. I have requested a copy of those labs for our documentation of kidney fx and for anemia on DAPT.

## 2011-12-23 NOTE — Patient Instructions (Addendum)
Your physician wants you to follow-up in: 6 months with Dr. Dietrich Pates. You will receive a reminder letter in the mail two months in advance. If you don't receive a letter, please call our office to schedule the follow-up appointment.  Your physician recommends that you continue on your current medications as directed. Please refer to the Current Medication list given to you today.

## 2011-12-30 ENCOUNTER — Ambulatory Visit: Payer: Medicare Other | Admitting: Orthopedic Surgery

## 2012-01-06 ENCOUNTER — Ambulatory Visit (INDEPENDENT_AMBULATORY_CARE_PROVIDER_SITE_OTHER): Payer: Medicare Other | Admitting: Orthopedic Surgery

## 2012-01-06 ENCOUNTER — Encounter: Payer: Self-pay | Admitting: Orthopedic Surgery

## 2012-01-06 VITALS — BP 130/80 | Ht 66.0 in | Wt 184.0 lb

## 2012-01-06 DIAGNOSIS — M1710 Unilateral primary osteoarthritis, unspecified knee: Secondary | ICD-10-CM

## 2012-01-06 DIAGNOSIS — IMO0002 Reserved for concepts with insufficient information to code with codable children: Secondary | ICD-10-CM

## 2012-01-06 NOTE — Progress Notes (Signed)
Patient ID: Alexandra [name], female   DOB: [birth year], 78 y.o.   MRN: [id number] Chief Complaint  Patient presents with  . Injections    recheck bilateral knees and inject    BP 130/80  Ht 5\' 6"  (1.676 m)  Wt 184 lb (83.462 kg)  BMI 29.70 kg/m2  Knee  Injection Procedure Note  Pre-operative Diagnosis: left knee oa  Post-operative Diagnosis: same  Indications: pain  Anesthesia: ethyl chloride   Procedure Details   Verbal consent was obtained for the procedure. Time out was completed.The joint was prepped with alcohol, followed by  Ethyl chloride spray and A 20 gauge needle was inserted into the knee via lateral approach; 4ml 1% lidocaine and 1 ml of depomedrol  was then injected into the joint . The needle was removed and the area cleansed and dressed.  Complications:  None; patient tolerated the procedure well. Knee  Injection Procedure Note  Pre-operative Diagnosis: right knee oa  Post-operative Diagnosis: same  Indications: pain  Anesthesia: ethyl chloride   Procedure Details   Verbal consent was obtained for the procedure. Time out was completed.The joint was prepped with alcohol, followed by  Ethyl chloride spray and A 20 gauge needle was inserted into the knee via lateral approach; 4ml 1% lidocaine and 1 ml of depomedrol  was then injected into the joint . The needle was removed and the area cleansed and dressed.  Complications:  None; patient tolerated the procedure well.

## 2012-01-06 NOTE — Patient Instructions (Addendum)
You have received a steroid shot. 15% of patients experience increased pain at the injection site with in the next 24 hours. This is best treated with ice and tylenol extra strength 2 tabs every 8 hours. If you are still having pain please call the office.    

## 2012-01-12 ENCOUNTER — Other Ambulatory Visit: Payer: Self-pay | Admitting: Pulmonary Disease

## 2012-01-13 ENCOUNTER — Other Ambulatory Visit: Payer: Self-pay | Admitting: Pulmonary Disease

## 2012-01-13 MED ORDER — AMITRIPTYLINE HCL 10 MG PO TABS: 10.0000 mg | ORAL_TABLET | Freq: Every day | ORAL | Status: AC

## 2012-01-20 ENCOUNTER — Encounter: Payer: Self-pay | Admitting: Pulmonary Disease

## 2012-01-20 ENCOUNTER — Other Ambulatory Visit (INDEPENDENT_AMBULATORY_CARE_PROVIDER_SITE_OTHER): Payer: Medicare Other

## 2012-01-20 ENCOUNTER — Ambulatory Visit (INDEPENDENT_AMBULATORY_CARE_PROVIDER_SITE_OTHER): Payer: Medicare Other | Admitting: Pulmonary Disease

## 2012-01-20 VITALS — BP 130/60 | HR 83 | Temp 96.8°F | Ht 66.0 in | Wt 187.6 lb

## 2012-01-20 DIAGNOSIS — K589 Irritable bowel syndrome without diarrhea: Secondary | ICD-10-CM

## 2012-01-20 DIAGNOSIS — E785 Hyperlipidemia, unspecified: Secondary | ICD-10-CM

## 2012-01-20 DIAGNOSIS — K219 Gastro-esophageal reflux disease without esophagitis: Secondary | ICD-10-CM

## 2012-01-20 DIAGNOSIS — I10 Essential (primary) hypertension: Secondary | ICD-10-CM

## 2012-01-20 DIAGNOSIS — E663 Overweight: Secondary | ICD-10-CM

## 2012-01-20 DIAGNOSIS — I7090 Unspecified atherosclerosis: Secondary | ICD-10-CM

## 2012-01-20 DIAGNOSIS — I739 Peripheral vascular disease, unspecified: Secondary | ICD-10-CM

## 2012-01-20 DIAGNOSIS — D649 Anemia, unspecified: Secondary | ICD-10-CM

## 2012-01-20 DIAGNOSIS — I872 Venous insufficiency (chronic) (peripheral): Secondary | ICD-10-CM

## 2012-01-20 DIAGNOSIS — F419 Anxiety disorder, unspecified: Secondary | ICD-10-CM

## 2012-01-20 DIAGNOSIS — E119 Type 2 diabetes mellitus without complications: Secondary | ICD-10-CM

## 2012-01-20 DIAGNOSIS — I2510 Atherosclerotic heart disease of native coronary artery without angina pectoris: Secondary | ICD-10-CM

## 2012-01-20 DIAGNOSIS — M1990 Unspecified osteoarthritis, unspecified site: Secondary | ICD-10-CM

## 2012-01-20 LAB — CBC WITH DIFFERENTIAL/PLATELET
Basophils Relative: 0.4 % (ref 0.0–3.0)
Eosinophils Relative: 5.3 % — ABNORMAL HIGH (ref 0.0–5.0)
Lymphocytes Relative: 12.9 % (ref 12.0–46.0)
MCV: 89.3 fl (ref 78.0–100.0)
Monocytes Absolute: 0.4 10*3/uL (ref 0.1–1.0)
Monocytes Relative: 4.6 % (ref 3.0–12.0)
Neutrophils Relative %: 76.8 % (ref 43.0–77.0)
RBC: 3.63 Mil/uL — ABNORMAL LOW (ref 3.87–5.11)
WBC: 8.5 10*3/uL (ref 4.5–10.5)

## 2012-01-20 LAB — HEPATIC FUNCTION PANEL
ALT: 12 U/L (ref 0–35)
Albumin: 4.1 g/dL (ref 3.5–5.2)
Total Bilirubin: 0.8 mg/dL (ref 0.3–1.2)

## 2012-01-20 LAB — BASIC METABOLIC PANEL
BUN: 23 mg/dL (ref 6–23)
Chloride: 95 mEq/L — ABNORMAL LOW (ref 96–112)
Creatinine, Ser: 1.2 mg/dL (ref 0.4–1.2)

## 2012-01-20 LAB — IBC PANEL
Saturation Ratios: 15.9 % — ABNORMAL LOW (ref 20.0–50.0)
Transferrin: 202 mg/dL — ABNORMAL LOW (ref 212.0–360.0)

## 2012-01-20 NOTE — Progress Notes (Signed)
Subjective:    Patient ID: Alexandra [name], female    DOB: [birth year], 78 y.o.   MRN: [id number]  HPI 78 y/o WF here for a 4 month follow up visit... he has multiple medical problems including AB, hx inactive Sarcoid, HBP, PVD, VI, Hyperlipidemia, DM, Obesity, GERD/ Divertics/ IBS, DJD/ FM/ osteop, Anxiety, Anemia...  ~  March 19, 2010:  4 mo ROV & she notes stable- Hydroxyzine helps "throat" episodes that occur w/ anxiety; no intercurrent infections;  BP stable on meds;  still too sedentary & not effectively dieting (wt w/o change);  she had BMD 6/11= osteoporotic in left FemNeck & Alendronate recommended to her but she flat out refuses to take the Bisphos Rx "I'll take my chances"...  ~  July 22, 2010:  778mo ROV & weight is down 13# on diet;  she's had occas episodes of palpit, heart racing & notes "throat closing" sensation when this occurs, noted w/ activity & valsalva maneuver eg bending down & assoc w/ SOB but denies CP;  EKG w/ atrial arrhythmias/ PACs & we discussed need for further Cards eval- she requests the [facility];  she is currently on ASA, Plavix, Metoprolol 25mg Bid, Avapro 150mg /d, Lasix 40mg /d;  BP well controlled on this...    She has hx old burnt-out Sarcoid> yearly CXR today shows mild cardiomeg, tort Ao, chr incr markings, NAD;  denies cough, sput, chr in her baseline SOB/ DOE, etc...    Her FLP looks good on Simva40;  DM control is excellent on Metformin Bid;  weight down to 190# as noted;  GI appears stable & is followed in [place] now;  Ortho per DrSHarrison in [place] w/ shots every 1mo or so!;  hx Anemia & Hg improved to 11.6 today, Fe=51, Sat=16%, B12 is high;  she uses Hydroxyzine (Vistaril) 25mg  Prn for nerves...  ~  November 18, 2010:  778mo ROV & she seems more anxious & sl more agitated than prev, also weaker, more feeble (ie couldn't step up onto the exam table); she is concerned for her cardiac status> DrHochrein did Holter monitor w/ PACs/ PVCs & no dangerous  arrhythmias & no definite AFib seen; CDopplers ordered for ?CBruit but I cannot find the results in EPIC;  He increased her Metoprolol to 50mg Bid, and she increased her ASA to 81mg Bid as well... She notes that her HYDROXYZINE (Vistaril) helps more than anything & I mentioned it was OK to take this regularly;  See prob list below>>  ~  August 25, 2011:  725mo ROV & we reviewed her interval developments & she continues to f/u w/ DrRothbart regularly...    She had nonQ-MI 10/12 & cath showed severe 3 vessel CAD w/ PTCA/ stent placed in LAD (99% occluded) + med rx w/ Plavix added; there was AK anteriorly but LVF was preserved; DrRothbart did a Myoview scan 3/13 w/ SVT developing after the infusion w/ CP & abn STsegment changes; she spont reverted to NSR w/o need for intervention; Myoview images showed scar anteroseptal area + soft tissue attenuation & minor ischemia at the apex & EF=77%;  They incr her Metoprolol to 75mg Bid...    She saw DrHarrison in [place] 1/13 for a left knee injection. LABS 3/13:  FLP- at goals on Simva40 x TG=157;  Chems- ok x BS=126 A1c=6.0 on Metform;  CBC- Hg=11.3;  TSH=4.11;  BNP=309  ~  December 07, 2011:  778mo ROV & Alexandra [name] was Adm 7/1 - 12/04/11 w/ unstable angina (pain involved her neck & throat,  then across her chest to arms) and cath showed another 99% focal proxLAD stenosis within the prev stent & mod RCA & Circ disease (including a 90% mid-distal RCA lesion- felt too difficult to intervene); she had a PTCA/ cutting balloon for the LAD in stent restenosis; continue ASA/ Plavix; continue other meds including Metoprolol, Amlodipine, Avapro, Lasix, Simvastatin...  She was also noted to be ANEMIC w/ Hg in the 8-9 range, stools reported neg & she is on FeSO4;  Renal function is borderline w/ Creat in the 0.9-1.2 range & GFR 40-50 range (her Metformin was held in hosp & review of DM control has been good on MetformBid so we decided to restart at just 500mg  Qam)... Finally she notes that "my  nerves are shot" and we will rec Lorazepam 0.5mg - 1/2 to 1 tab Tid as needed...    We reviewed prob list, meds, xrays and labs> see below for updates>> CXR 7/13 showed cardiomegaly & mild vasc congestion... LABS 7/13:  Chems- BS=144 BUN=16 Creat=1.17;  CBC- Hg=8.7  ~  January 20, 2012:  6wk ROV & Alexandra [name] is here for follow up; she also brought several loose papers from the [place] for her license- I told her we needed the whole package to complete but she was quite insistent that she had to bring the separate pages to all her specialists for completion, so- whatever, we completed what she brought to [place] & returned them to her...     Known CAD w/ recent in-stent restenosis intervention> no further angina/ CP & she is stable on ASA/ Plavix- Cards also wants her to continue ProtonixBid...    We had restarted her Metformin500mg /d & repeat labs today showed BS=118...    She had mild renal insuffic w/ Creat~1.2 & GFR 40-50 range and this is stable...    She had anemia- on FeSO4 and her Hg is up 2 points from disch 8.7 ==> 10.7 w/ FE=45 (16%) today...    We gave her Ativan to try for her anxiety "my nerves are shot" & she feels this is helpful... We reviewed prob list, meds, xrays and labs> see below for updates >> LABS 8/13:  Chems- ok w/ BS=118 BUN=23 Creat=1.2 GFR=44;  CBC- improved w/ Hg=10.7. Fe=45 (sat16%)    Problem List:  Hx of ASTHMATIC BRONCHITIS, ACUTE (ICD-466.0) - uses Mucinex as needed... she has chr stable DOE w/o change... she say she walks "some" but is obviously too sedentary due to her arthritis... she has PNEUMOVAX several yrs ago.  Hx of SARCOIDOSIS (ICD-135) - initially diagnosed in 1992 w/ CXR showing diffuse ILDis, +Gallium scan, ACE=61, & Bronch w/ bx showing non-caseating granulomas... treated w/ Pred & weaned off w/ no active disease since then... last CTChest 7/04 w/ ca++ Ao & coronaries, +mediastinal fat, centilobular emphysema, some scarring... stable without new symptoms of  cough, sputum, CP or dyspnea... ~  CXR 1/09 showed stable chr lung changes & prom right hilum... ~  CXR 2/11 showed diffuse ILDz, no adenopathy, NAD.[name]. ~  CXR 2/12 showed borderline cardiomeg, tort Ao, sl incr markings as before, NAD.[name]. ~  CXR 10/12 in hosp showed cardiomeg, calcif in Ao, vasc congestion w/ pulm edema & sm effusions... ~  CXR 7/13 in hosp showed cardiomegaly & mild vasc congestion...  HYPERTENSION (ICD-401.9) - controlled on meds: METOPROLOL 50mg - 1.5 tabsBid, AMLODIPINE 2.5mg /d, AVAPRO 150mg /d, LASIX 40mg /d... ~  Myoview 12/05 was neg- no ischemia, no infarct... ~  EKG 2/11 showed NSR, rate 82/min, WNL.[name]. ~  4/12:  DrHochrein  increased her Metoprolol from 25Bid to 50Bid for her BP & ectopy... ~  6/12:  BP= 136/74 & denies HA, visual changes, CP, palipit, syncope, change in dyspnea, edema, etc... ~  3/13:  BP= 124/70 & she is feeling well, denies CP/ SOB/ ch in edema/ etc... ~  7/13:  BP= 124/60 & these meds were not changed during her recent hosp for angina w/ cath & PCI for LAD in-stent restenosis... ~  8/13:  BP= 130/60 & she denies CP, palpit, ch in SOB or edema...  PALPITATIONS & Ventric Ectopy>  Eval & Rx by DrHochrein, his notes are reviewed... On METOPROLOL 75mg Bid & she uses HYDROXYZINE (Vistaril) 25mg  Prn which she says is helpful... ~  Event Monitor done 4/12 and no definite AFib encountered, just PVCs...  ~  EKG shows just some PACs, otherw neg...  CORONARY ARTERY DISEASE >> on ASA 81mg /d & PLAVIX 75mg /d; plus the meds above & followed by DrRothbart... ~  She had nonQ-MI 10/12 & cath showed severe 3 vessel CAD w/ PTCA/ stent placed in LAD (99% occluded) + med rx w/ Plavix added; there was AK anteriorly but LVF was preserved... ~  EKG 11/12 showed NSR, rate85, PACs, septal infarct, otherw neg... ~  DrRothbart did a Myoview scan 3/13 w/ SVT developing after the infusion w/ CP & abn STsegment changes; she spont reverted to NSR w/o need for intervention; Myoview images  showed scar anteroseptal area + soft tissue attenuation & minor ischemia at the apex & EF=77%; They incr her Metoprolol to 75mg Bid... ~  7/13:  She was [facility] for unstable angina w/ cath showing 99% prox LAD in-stent restenosis- PCI w/ cutting ballon & improved; she had mod dis in RCA & Circ (including worsening 90% distal RCA stenosis- but intervention was r/o)... EKG showed NSR, PACs, rate66, otherw wnl, NAD...  PERIPHERAL VASCULAR DISEASE (ICD-443.9) - on ASA 81mg /d, PLAVIX 75mg /d... doing well w/o claud symptoms... ~  Art Dopplers of LE's 6/06 showed biphasic waveforms in ant tib & peroneal arts, normal ABI's... ~  CDopplers 5/12 showed mild heterogeneous plaque bilat w/ 40-59% bilat ICA stenoses... ~  CDopplers 4/13 showed mild to mod plaque bilat, 50-69% bilat ICA stenoses (lower end but R>L)...  VENOUS INSUFFICIENCY (ICD-459.81) - on low sodium diet and the Lasix... she knows to elevate legs, wear support hose, etc... ~  7/13:  She has persistent bilat LE edema & weight is similar 192# on LASIX 40mg /d   HYPERLIPIDEMIA (ICD-272.4) - on SIMVASTATIN 40mg /d... ~  FLP 1/08 showed TChol 121, TG 120, HDL 46, LDL 51... looks great- continue Simva40. ~  FLP 1/09 showed TChol 137, TG 165, HDL 37, LDL 67 ~  FLP 9/09 showed TChol 133, TG 141, HDL 47, LDL 57 ~  FLP 5/10 showed TChol 131, TG 190, HDL 46, LDL 47 ~  FLP 2/11 showed TChol 129, TG 129, HDL 52, LDL 51 ~  FLP 2/12 showed TChol 134, TG 159, HDL 49, LDL 53 ~  FLP 3/13 on Simva40 showed TChol 112, TG 157, HDL 46, LDL 35... Continue same + better low fat diet.  DM (ICD-250.00) - on diet + METFORMIN 500mg - 1Bid... unable to exercise or lose wt...  ~  labs 1/08 showed BS=130 and HgA1c=6.1 ~  labs 1/09 showed BS= 137, HgA1c= 6.2... diet and exercise are the keys!!! ~  labs 9/09 showed BS= 119, HgA1c= 6.3 ~  labs 5/10 showed BS= 134, A1c= 6.3 ~  labs 2/11 showed BS= 122, A1c= 6.3 ~  labs 6/11 showed BS= 106 ~  Labs 2/12 showed BS= 138, A1c=  6.3 ~  Labs 3/13 on Metform500Bid showed BS= 126, A1c= 6.0.Marland KitchenMarland Kitchen Continue same, get wt down! ~  7/13:  Metformin was held 7/13 Hosp w/ Creat= 0.9-1.2 & GFR= 40-50 range; we decided to restart METFORMIN 500mg  Qam only... ~  Labs 8/13 on Metform500 showed BS= 118  MORBID OBESITY (ICD-278.01) - prev wt 227#  & 5\' 2"  tall for a BMI=41-42...  ~  weight 1/10 = 217# ~  weight 5/10 = 215# ~  weight 9/10 = 210# ~  weight 2/11 = 210# ~  weight 6/11 = 203# ~  weight 10/11 = 203# ~  Weight 6/12 = 195# ~  Weight 3/13 = 192# ~  Weight 7/13 = 192# ~  Weight 8/13 = 188#  GERD (ICD-530.81) - pt was placed on PROTONIX 40mg Bid 7/13 due to reflux symptoms and Anemia... ~  She is encouraged to f/u w/ GI & she indicates that she will call DrRehman when she is ready...  DIVERTICULOSIS, COLON (ICD-562.10) - had colonoscopy by DrDBrodie 1997 that was neg x divertics... now followed in [place]...  IRRITABLE BOWEL SYNDROME (ICD-564.1)  RENAL INSUFFICIENCY >> Creat ~1.2 and GFR ~ 40-50 range...  DEGENERATIVE JOINT DISEASE, ADVANCED (ICD-715.90) - she has mod severe bilat knee pain and sees DrHarrison, Ortho for shots "every three months"... she is opposed to surgery (see his EMR notes- reviewed)...  VITAMIN D DEFICIENCY (ICD-268.9) ~  labs 5/10 showed Vit D level =13... therefore rec Vit D 50000 u weekly. ~  labs 2/11 showed Vit D level = 32... OK to change to VitD 1000 u daily.. ~  BMD done 6/11 showed TScores -1.3 in Spine, and -2.5 in left FemNeck... rec> start Alendronate 70mg /wk but she refuses Bisphos Rx "I'll take my chances"...  FIBROMYALGIA (ICD-729.1)  ANXIETY (ICD-300.00) - we prev perscribed HYDROXYZINE PAMOATE 25mg  Prn for "throat closing" symptoms... ~  7/13:  She admits that "my nerves are shot" after recent [facility] & Rx LORAZEPAM 0.5mg - 1/2 to 1 tab Tid as needed...  ANEMIA (ICD-285.9) - hx mild anemia w/ Hg = 10.8 Jan09 & started Fe daily at that time...  ~  labs 5/09 showed Hg= 11.7,  Fe= 64 ~  labs 9/09 showed Hg= 11.4, Fe= 43... rec- take FeSO4 w/ Vit C 500mg /d. ~  labs 5/10 showed Hg= 11.3,  Fe= 50 ~  labs 2/11 showed Hg= 10.7, Fe= 36 (12%)... rec> incr Fe Vit C to Bid. ~  labs 6/11 showed Hg= 10.9, Fe= 58 (19%sat)... continue Bid Fe ~  Labs 2/12 showed Hg= 11.6, Fe= 51 (16%sat)... Continue same. ~  Labs 3/13 showed Hg= 11.3.Marland KitchenMarland Kitchen Continue FeSO4 Bid... ~  Labs during 7/13 Hosp showed Hg= 8-9 range... rec to continue FeSO4 Bid... ~  Labs 8/13 showed Hg= 10.7 and Fe= 45 (16%)... Ok to decr Fe to one daily...  INTERMITTENT VERTIGO (ICD-780.4) - prev eval DrWeymann w/ Meclizine Prn...   Past Surgical History  Procedure Date  . Cholecystectomy   . Tonsillectomy   . Hernia repair   . Cataract extraction     Outpatient Encounter Prescriptions as of 01/20/2012  Medication Sig Dispense Refill  . acetaminophen (TYLENOL) 500 MG tablet Take 500 mg by mouth every 6 (six) hours as needed.        [name] amitriptyline (ELAVIL) 10 MG tablet Take 1 tablet (10 mg total) by mouth at bedtime.  30 tablet  11  . amLODipine (NORVASC) 2.5  MG tablet Take 2.5 mg by mouth at bedtime.      [name] ascorbic acid (VITAMIN C) 500 MG tablet Take 500 mg by mouth 2 (two) times daily. Twice daily with iron tablet      . aspirin EC 81 MG tablet Take 81 mg by mouth every morning.       . cholecalciferol (VITAMIN D) 1000 UNITS tablet Take 1,000 Units by mouth daily.      . clopidogrel (PLAVIX) 75 MG tablet Take 75 mg by mouth every morning.      [name] econazole nitrate 1 % cream Apply 1 application topically daily as needed. FOR FEET IRRITATION      . ferrous sulfate 324 (65 FE) MG TBEC Take 1 tablet by mouth 2 (two) times daily.       . furosemide (LASIX) 40 MG tablet Take 40 mg by mouth daily.      [name] glucose blood test strip 1 each by Other route as needed. Use as instructed       . hydrOXYzine (ATARAX/VISTARIL) 25 MG tablet Take 25 mg by mouth every 6 (six) hours as needed. For itching or anxiety      .  irbesartan (AVAPRO) 150 MG tablet Take 150 mg by mouth daily.      [name] LORazepam (ATIVAN) 0.5 MG tablet Take 1/2 to 1 tablet by mouth three times daily as needed for nerves  90 tablet  5  . meclizine (ANTIVERT) 25 MG tablet Take 25 mg by mouth 3 (three) times daily as needed.      . metFORMIN (GLUCOPHAGE) 500 MG tablet Take 500 mg by mouth daily with breakfast.      . metoprolol (LOPRESSOR) 50 MG tablet Take  1 1/2 tablets (75 mg ) twice daily      . nitroGLYCERIN (NITROSTAT) 0.4 MG SL tablet Place 1 tablet (0.4 mg total) under the tongue every 5 (five) minutes x 3 doses as needed.      . pantoprazole (PROTONIX) 40 MG tablet Take 1 tablet (40 mg total) by mouth 2 (two) times daily.  60 tablet  6  . simvastatin (ZOCOR) 40 MG tablet Take 40 mg by mouth at bedtime.      . vitamin B-12 (CYANOCOBALAMIN) 1000 MCG tablet Take 1,000 mcg by mouth every morning.        Allergies  Allergen Reactions  . Codeine Other (See Comments)    Reaction: severe GI pain  . Diazepam Other (See Comments)    REACTION: causes pt unable to wake up after taking  . Lipitor (Atorvastatin Calcium) Other (See Comments)    Reaction: causes vertigo  . Penicillins Other (See Comments)    Reaction: unknown (childhood )  . Sulfonamide Derivatives Other (See Comments)    Reaction: unknown (childhood)    Current Medications, Allergies, Past Medical History, Past Surgical History, Family History, and Social History were reviewed in [employer] record.   Review of Systems        See HPI - all other systems neg except as noted... The patient complains of decreased hearing, dyspnea on exertion, peripheral edema, muscle weakness, and difficulty walking.  The patient denies anorexia, fever, weight loss, weight gain, vision loss, hoarseness, chest pain, syncope, prolonged cough, headaches, hemoptysis, abdominal pain, melena, hematochezia, severe indigestion/heartburn, hematuria, incontinence, suspicious skin  lesions, transient blindness, depression, unusual weight change, abnormal bleeding, enlarged lymph nodes, and angioedema.   Objective:   Physical Exam      WD, Obese,  78 y/o WF in NAD... GENERAL:  Alert & oriented; pleasant & cooperative... she is very talkative... HEENT:  [place]/AT, Glasses, EOM-full, PERRLA, TMs-wax, NOSE-clear, THROAT-clear & wnl. NECK:  Supple w/ fairROM; no JVD; no carotid bruits; no thyromegaly or nodules palpated; no lymphadenopathy. CHEST:  decr BS bilat but clear w/o wheezing, rales or signs of consolidation... HEART:   sl irregular rhythm; without murmurs/ rubs/ or gallops detected... ABDOMEN:  Obese w/ panniculus, soft & nontender; normal bowel sounds; no organomegaly or masses palpated. EXT:  mod arthritic changes; no varicose veins/ +venous insuffic/ tr+edema bilat NEURO:  CN's intact, no focal neuro deficits... DERM:  few ecchymoses, no rash etc...  RADIOLOGY DATA:  Reviewed in the EPIC EMR & discussed w/ the patient...  LABORATORY DATA:  Reviewed in the EPIC EMR & discussed w/ the patient...   Assessment & Plan:    AB>  Breathing is stable w/o exac...  Hx Sarcoid>  CXR w/ chr changes, NAD...  HBP>  Controlled on meds, continue same, take regularly...  CAD>  Adm 7/13 w/ unstable angina & had LAD in-stent restenosis, another PCI & improved (see above); disch on similar med regimen w/ ASA & Plavix...  Palpit>  On going eval & med adjustments from DrHochrein; better on the incr Metoprolol but notes that the Vistaril really helps...  PVD>  On ASA & Plavix w/o cerebral ischemic symptoms; CDoppler 4/13 w/ sl progression- continue meds...  Ven Insuffic/ Edema>  We reviewed the nec sodium restriction and continue the Lasix40...  HYPERLIPID>  Stable on the Simva40...  DM>  Metform was held in [place] & we will restart at lower dose 500mg  Qam...  GI>  GERD, Divertics, IBS>  Stable, continue Protonix40Bid...  DJD>  Followed by DrHarrison in [place]...  Anxiety>   As noted Lorazepam 0.5mg  prescribed...   Patient's Medications  New Prescriptions   No medications on file  Previous Medications   ACETAMINOPHEN (TYLENOL) 500 MG TABLET    Take 500 mg by mouth every 6 (six) hours as needed.     AMITRIPTYLINE (ELAVIL) 10 MG TABLET    Take 1 tablet (10 mg total) by mouth at bedtime.   AMLODIPINE (NORVASC) 2.5 MG TABLET    Take 2.5 mg by mouth at bedtime.   ASCORBIC ACID (VITAMIN C) 500 MG TABLET    Take 500 mg by mouth 2 (two) times daily. Twice daily with iron tablet   ASPIRIN EC 81 MG TABLET    Take 81 mg by mouth every morning.    CHOLECALCIFEROL (VITAMIN D) 1000 UNITS TABLET    Take 1,000 Units by mouth daily.   CLOPIDOGREL (PLAVIX) 75 MG TABLET    Take 75 mg by mouth every morning.   ECONAZOLE NITRATE 1 % CREAM    Apply 1 application topically daily as needed. FOR FEET IRRITATION   FERROUS SULFATE 324 (65 FE) MG TBEC    Take 1 tablet by mouth 2 (two) times daily.    FUROSEMIDE (LASIX) 40 MG TABLET    Take 40 mg by mouth daily.   GLUCOSE BLOOD TEST STRIP    1 each by Other route as needed. Use as instructed    HYDROXYZINE (ATARAX/VISTARIL) 25 MG TABLET    Take 25 mg by mouth every 6 (six) hours as needed. For itching or anxiety   IRBESARTAN (AVAPRO) 150 MG TABLET    Take 150 mg by mouth daily.   LORAZEPAM (ATIVAN) 0.5 MG TABLET    Take 1/2 to 1 tablet by  mouth three times daily as needed for nerves   MECLIZINE (ANTIVERT) 25 MG TABLET    Take 25 mg by mouth 3 (three) times daily as needed.   METFORMIN (GLUCOPHAGE) 500 MG TABLET    Take 500 mg by mouth daily with breakfast.   METOPROLOL (LOPRESSOR) 50 MG TABLET    Take  1 1/2 tablets (75 mg ) twice daily   NITROGLYCERIN (NITROSTAT) 0.4 MG SL TABLET    Place 1 tablet (0.4 mg total) under the tongue every 5 (five) minutes x 3 doses as needed.   PANTOPRAZOLE (PROTONIX) 40 MG TABLET    Take 1 tablet (40 mg total) by mouth 2 (two) times daily.   SIMVASTATIN (ZOCOR) 40 MG TABLET    Take 40 mg by mouth at  bedtime.   VITAMIN B-12 (CYANOCOBALAMIN) 1000 MCG TABLET    Take 1,000 mcg by mouth every morning.  Modified Medications   No medications on file  Discontinued Medications   No medications on file

## 2012-01-20 NOTE — Patient Instructions (Addendum)
Today we updated your med list in our EPIC system...    Continue your current medications the same...  Today we did your follow up blood work...    We will call you w/ the results & any change in your meds...  Let's plan a follow up visit in 3 months.Marland KitchenMarland Kitchen

## 2012-02-11 ENCOUNTER — Other Ambulatory Visit: Payer: Self-pay | Admitting: Pulmonary Disease

## 2012-02-23 ENCOUNTER — Ambulatory Visit: Payer: Medicare Other | Admitting: Pulmonary Disease

## 2012-03-01 DIAGNOSIS — IMO0001 Reserved for inherently not codable concepts without codable children: Secondary | ICD-10-CM

## 2012-03-01 DIAGNOSIS — D649 Anemia, unspecified: Secondary | ICD-10-CM

## 2012-03-01 DIAGNOSIS — Z5181 Encounter for therapeutic drug level monitoring: Secondary | ICD-10-CM

## 2012-03-01 DIAGNOSIS — M159 Polyosteoarthritis, unspecified: Secondary | ICD-10-CM

## 2012-03-01 DIAGNOSIS — K219 Gastro-esophageal reflux disease without esophagitis: Secondary | ICD-10-CM

## 2012-03-01 DIAGNOSIS — I2510 Atherosclerotic heart disease of native coronary artery without angina pectoris: Secondary | ICD-10-CM

## 2012-03-20 ENCOUNTER — Encounter (HOSPITAL_COMMUNITY): Payer: Self-pay | Admitting: Emergency Medicine

## 2012-03-20 ENCOUNTER — Emergency Department (HOSPITAL_COMMUNITY): Payer: Medicare Other

## 2012-03-20 ENCOUNTER — Inpatient Hospital Stay (HOSPITAL_COMMUNITY)
Admission: EM | Admit: 2012-03-20 | Discharge: 2012-03-24 | DRG: 251 | Disposition: A | Discharge status: Home / Self Care | Payer: Medicare Other | Attending: Cardiology | Admitting: Cardiology

## 2012-03-20 DIAGNOSIS — F411 Generalized anxiety disorder: Secondary | ICD-10-CM | POA: Diagnosis present

## 2012-03-20 DIAGNOSIS — I70209 Unspecified atherosclerosis of native arteries of extremities, unspecified extremity: Secondary | ICD-10-CM | POA: Diagnosis present

## 2012-03-20 DIAGNOSIS — E559 Vitamin D deficiency, unspecified: Secondary | ICD-10-CM | POA: Diagnosis present

## 2012-03-20 DIAGNOSIS — J99 Respiratory disorders in diseases classified elsewhere: Secondary | ICD-10-CM | POA: Diagnosis present

## 2012-03-20 DIAGNOSIS — I471 Supraventricular tachycardia, unspecified: Secondary | ICD-10-CM | POA: Diagnosis present

## 2012-03-20 DIAGNOSIS — I959 Hypotension, unspecified: Secondary | ICD-10-CM | POA: Diagnosis not present

## 2012-03-20 DIAGNOSIS — I2510 Atherosclerotic heart disease of native coronary artery without angina pectoris: Secondary | ICD-10-CM | POA: Diagnosis present

## 2012-03-20 DIAGNOSIS — E119 Type 2 diabetes mellitus without complications: Secondary | ICD-10-CM | POA: Diagnosis present

## 2012-03-20 DIAGNOSIS — Z7902 Long term (current) use of antithrombotics/antiplatelets: Secondary | ICD-10-CM

## 2012-03-20 DIAGNOSIS — N182 Chronic kidney disease, stage 2 (mild): Secondary | ICD-10-CM | POA: Diagnosis present

## 2012-03-20 DIAGNOSIS — I679 Cerebrovascular disease, unspecified: Secondary | ICD-10-CM | POA: Diagnosis present

## 2012-03-20 DIAGNOSIS — D869 Sarcoidosis, unspecified: Secondary | ICD-10-CM | POA: Diagnosis present

## 2012-03-20 DIAGNOSIS — Y840 Cardiac catheterization as the cause of abnormal reaction of the patient, or of later complication, without mention of misadventure at the time of the procedure: Secondary | ICD-10-CM | POA: Diagnosis present

## 2012-03-20 DIAGNOSIS — Y92009 Unspecified place in unspecified non-institutional (private) residence as the place of occurrence of the external cause: Secondary | ICD-10-CM

## 2012-03-20 DIAGNOSIS — Z880 Allergy status to penicillin: Secondary | ICD-10-CM

## 2012-03-20 DIAGNOSIS — I5031 Acute diastolic (congestive) heart failure: Secondary | ICD-10-CM | POA: Diagnosis present

## 2012-03-20 DIAGNOSIS — I252 Old myocardial infarction: Secondary | ICD-10-CM

## 2012-03-20 DIAGNOSIS — K5730 Diverticulosis of large intestine without perforation or abscess without bleeding: Secondary | ICD-10-CM | POA: Diagnosis present

## 2012-03-20 DIAGNOSIS — Z882 Allergy status to sulfonamides status: Secondary | ICD-10-CM

## 2012-03-20 DIAGNOSIS — J45909 Unspecified asthma, uncomplicated: Secondary | ICD-10-CM | POA: Diagnosis present

## 2012-03-20 DIAGNOSIS — I491 Atrial premature depolarization: Secondary | ICD-10-CM | POA: Diagnosis not present

## 2012-03-20 DIAGNOSIS — I249 Acute ischemic heart disease, unspecified: Secondary | ICD-10-CM

## 2012-03-20 DIAGNOSIS — E785 Hyperlipidemia, unspecified: Secondary | ICD-10-CM | POA: Diagnosis present

## 2012-03-20 DIAGNOSIS — Z7982 Long term (current) use of aspirin: Secondary | ICD-10-CM

## 2012-03-20 DIAGNOSIS — I10 Essential (primary) hypertension: Secondary | ICD-10-CM | POA: Diagnosis present

## 2012-03-20 DIAGNOSIS — I080 Rheumatic disorders of both mitral and aortic valves: Secondary | ICD-10-CM | POA: Diagnosis present

## 2012-03-20 DIAGNOSIS — IMO0001 Reserved for inherently not codable concepts without codable children: Secondary | ICD-10-CM | POA: Diagnosis present

## 2012-03-20 DIAGNOSIS — I214 Non-ST elevation (NSTEMI) myocardial infarction: Principal | ICD-10-CM | POA: Diagnosis present

## 2012-03-20 DIAGNOSIS — Z888 Allergy status to other drugs, medicaments and biological substances status: Secondary | ICD-10-CM

## 2012-03-20 DIAGNOSIS — D649 Anemia, unspecified: Secondary | ICD-10-CM | POA: Diagnosis present

## 2012-03-20 DIAGNOSIS — I7090 Unspecified atherosclerosis: Secondary | ICD-10-CM

## 2012-03-20 DIAGNOSIS — I739 Peripheral vascular disease, unspecified: Secondary | ICD-10-CM | POA: Diagnosis present

## 2012-03-20 DIAGNOSIS — I200 Unstable angina: Secondary | ICD-10-CM

## 2012-03-20 DIAGNOSIS — I872 Venous insufficiency (chronic) (peripheral): Secondary | ICD-10-CM | POA: Diagnosis present

## 2012-03-20 DIAGNOSIS — I129 Hypertensive chronic kidney disease with stage 1 through stage 4 chronic kidney disease, or unspecified chronic kidney disease: Secondary | ICD-10-CM | POA: Diagnosis present

## 2012-03-20 DIAGNOSIS — K219 Gastro-esophageal reflux disease without esophagitis: Secondary | ICD-10-CM | POA: Diagnosis present

## 2012-03-20 DIAGNOSIS — I059 Rheumatic mitral valve disease, unspecified: Secondary | ICD-10-CM

## 2012-03-20 DIAGNOSIS — Z79899 Other long term (current) drug therapy: Secondary | ICD-10-CM

## 2012-03-20 DIAGNOSIS — T82897A Other specified complication of cardiac prosthetic devices, implants and grafts, initial encounter: Secondary | ICD-10-CM | POA: Diagnosis present

## 2012-03-20 DIAGNOSIS — N289 Disorder of kidney and ureter, unspecified: Secondary | ICD-10-CM

## 2012-03-20 DIAGNOSIS — F419 Anxiety disorder, unspecified: Secondary | ICD-10-CM | POA: Diagnosis present

## 2012-03-20 DIAGNOSIS — E876 Hypokalemia: Secondary | ICD-10-CM | POA: Diagnosis not present

## 2012-03-20 DIAGNOSIS — Z9861 Coronary angioplasty status: Secondary | ICD-10-CM

## 2012-03-20 DIAGNOSIS — K589 Irritable bowel syndrome without diarrhea: Secondary | ICD-10-CM | POA: Diagnosis present

## 2012-03-20 DIAGNOSIS — E669 Obesity, unspecified: Secondary | ICD-10-CM | POA: Diagnosis present

## 2012-03-20 HISTORY — DX: Endocarditis, valve unspecified: I38

## 2012-03-20 HISTORY — DX: Atrial premature depolarization: I49.1

## 2012-03-20 LAB — CBC WITH DIFFERENTIAL/PLATELET
Basophils Absolute: 0 10*3/uL (ref 0.0–0.1)
Eosinophils Absolute: 0.5 10*3/uL (ref 0.0–0.7)
Eosinophils Relative: 6 % — ABNORMAL HIGH (ref 0–5)
HCT: 32.7 % — ABNORMAL LOW (ref 36.0–46.0)
Lymphs Abs: 1.2 10*3/uL (ref 0.7–4.0)
MCH: 30.1 pg (ref 26.0–34.0)
MCV: 87.2 fL (ref 78.0–100.0)
Monocytes Absolute: 0.4 10*3/uL (ref 0.1–1.0)
Platelets: 173 10*3/uL (ref 150–400)
RDW: 13.5 % (ref 11.5–15.5)

## 2012-03-20 LAB — COMPREHENSIVE METABOLIC PANEL
ALT: 7 U/L (ref 0–35)
AST: 15 U/L (ref 0–37)
Albumin: 4.2 g/dL (ref 3.5–5.2)
Alkaline Phosphatase: 80 U/L (ref 39–117)
Chloride: 93 mEq/L — ABNORMAL LOW (ref 96–112)
Potassium: 3.8 mEq/L (ref 3.5–5.1)
Sodium: 132 mEq/L — ABNORMAL LOW (ref 135–145)
Total Bilirubin: 0.5 mg/dL (ref 0.3–1.2)
Total Protein: 7.9 g/dL (ref 6.0–8.3)

## 2012-03-20 LAB — GLUCOSE, CAPILLARY: Glucose-Capillary: 130 mg/dL — ABNORMAL HIGH (ref 70–99)

## 2012-03-20 LAB — TROPONIN I: Troponin I: <0.3 ng/mL (ref <0.30)

## 2012-03-20 MED ORDER — ASPIRIN 81 MG PO CHEW
324.0000 mg | CHEWABLE_TABLET | Freq: Once | ORAL | Status: AC
  Administered 2012-03-20: 324 mg via ORAL
  Filled 2012-03-20: qty 4

## 2012-03-20 MED ORDER — ONDANSETRON HCL 4 MG/2ML IJ SOLN: 4.0000 mg | Freq: Four times a day (QID) | INTRAMUSCULAR | Status: DC | PRN

## 2012-03-20 MED ORDER — NITROGLYCERIN IN D5W 200-5 MCG/ML-% IV SOLN
2.0000 ug/min | INTRAVENOUS | Status: DC
  Administered 2012-03-21: 40 ug/min via INTRAVENOUS
  Filled 2012-03-20: qty 250

## 2012-03-20 MED ORDER — ASPIRIN 81 MG PO CHEW: 324.0000 mg | CHEWABLE_TABLET | Freq: Once | ORAL | Status: DC

## 2012-03-20 MED ORDER — INSULIN ASPART 100 UNIT/ML ~~LOC~~ SOLN
0.0000 [IU] | Freq: Three times a day (TID) | SUBCUTANEOUS | Status: DC
  Administered 2012-03-21 – 2012-03-22 (×3): 1 [IU] via SUBCUTANEOUS
  Administered 2012-03-24: 3 [IU] via SUBCUTANEOUS

## 2012-03-20 MED ORDER — PANTOPRAZOLE SODIUM 40 MG PO TBEC
40.0000 mg | DELAYED_RELEASE_TABLET | Freq: Two times a day (BID) | ORAL | Status: DC
  Administered 2012-03-20 – 2012-03-24 (×9): 40 mg via ORAL
  Filled 2012-03-20 (×9): qty 1

## 2012-03-20 MED ORDER — DEXTROSE 5 % IV SOLN: 200.0000 ug/min | INTRAVENOUS | Status: DC

## 2012-03-20 MED ORDER — AMLODIPINE BESYLATE 2.5 MG PO TABS
2.5000 mg | ORAL_TABLET | Freq: Every day | ORAL | Status: DC
  Filled 2012-03-20 (×2): qty 1

## 2012-03-20 MED ORDER — AMITRIPTYLINE HCL 10 MG PO TABS
10.0000 mg | ORAL_TABLET | Freq: Every day | ORAL | Status: DC
  Administered 2012-03-20 – 2012-03-23 (×4): 10 mg via ORAL
  Filled 2012-03-20 (×5): qty 1

## 2012-03-20 MED ORDER — NON FORMULARY: 10.0000 mg | Freq: Every day | Status: DC

## 2012-03-20 MED ORDER — LORAZEPAM 0.5 MG PO TABS
0.5000 mg | ORAL_TABLET | Freq: Three times a day (TID) | ORAL | Status: DC | PRN
  Administered 2012-03-22: 0.5 mg via ORAL
  Filled 2012-03-20 (×2): qty 1

## 2012-03-20 MED ORDER — VITAMIN D3 25 MCG (1000 UNIT) PO TABS
1000.0000 [IU] | ORAL_TABLET | Freq: Every day | ORAL | Status: DC
  Administered 2012-03-21 – 2012-03-24 (×4): 1000 [IU] via ORAL
  Filled 2012-03-20 (×5): qty 1

## 2012-03-20 MED ORDER — SODIUM CHLORIDE 0.9 % IV SOLN: INTRAVENOUS | Status: DC

## 2012-03-20 MED ORDER — MECLIZINE HCL 25 MG PO TABS
25.0000 mg | ORAL_TABLET | Freq: Three times a day (TID) | ORAL | Status: DC | PRN
  Filled 2012-03-20: qty 1

## 2012-03-20 MED ORDER — METOPROLOL TARTRATE 50 MG PO TABS
75.0000 mg | ORAL_TABLET | Freq: Two times a day (BID) | ORAL | Status: DC
  Administered 2012-03-20: 75 mg via ORAL
  Filled 2012-03-20 (×4): qty 1

## 2012-03-20 MED ORDER — ASPIRIN EC 81 MG PO TBEC
81.0000 mg | DELAYED_RELEASE_TABLET | Freq: Every day | ORAL | Status: DC
  Administered 2012-03-22 – 2012-03-24 (×3): 81 mg via ORAL
  Filled 2012-03-20 (×4): qty 1

## 2012-03-20 MED ORDER — ROSUVASTATIN CALCIUM 10 MG PO TABS
10.0000 mg | ORAL_TABLET | Freq: Every day | ORAL | Status: DC
  Administered 2012-03-20 – 2012-03-23 (×4): 10 mg via ORAL
  Filled 2012-03-20 (×5): qty 1

## 2012-03-20 MED ORDER — ASPIRIN EC 81 MG PO TBEC: 81.0000 mg | DELAYED_RELEASE_TABLET | Freq: Every day | ORAL | Status: DC

## 2012-03-20 MED ORDER — VITAMIN B-12 1000 MCG PO TABS
1000.0000 ug | ORAL_TABLET | Freq: Every morning | ORAL | Status: DC
  Administered 2012-03-21 – 2012-03-24 (×4): 1000 ug via ORAL
  Filled 2012-03-20 (×5): qty 1

## 2012-03-20 MED ORDER — SODIUM CHLORIDE 0.9 % IJ SOLN
3.0000 mL | Freq: Two times a day (BID) | INTRAMUSCULAR | Status: DC
  Administered 2012-03-20 – 2012-03-24 (×7): 3 mL via INTRAVENOUS

## 2012-03-20 MED ORDER — HEPARIN SODIUM (PORCINE) 5000 UNIT/ML IJ SOLN
4000.0000 [IU] | Freq: Once | INTRAMUSCULAR | Status: AC
  Administered 2012-03-20: 4000 [IU] via INTRAVENOUS

## 2012-03-20 MED ORDER — SODIUM CHLORIDE 0.9 % IV SOLN: 250.0000 mL | INTRAVENOUS | Status: DC | PRN

## 2012-03-20 MED ORDER — HEPARIN (PORCINE) IN NACL 100-0.45 UNIT/ML-% IJ SOLN
650.0000 [IU]/h | INTRAMUSCULAR | Status: DC
  Administered 2012-03-21: 800 [IU]/h via INTRAVENOUS
  Filled 2012-03-20: qty 250

## 2012-03-20 MED ORDER — PERFLUTREN LIPID MICROSPHERE
INTRAVENOUS | Status: AC
  Administered 2012-03-20: 2 mL via INTRAVENOUS
  Filled 2012-03-20: qty 10

## 2012-03-20 MED ORDER — NITROGLYCERIN IN D5W 200-5 MCG/ML-% IV SOLN
5.0000 ug/min | Freq: Once | INTRAVENOUS | Status: DC
  Filled 2012-03-20: qty 250

## 2012-03-20 MED ORDER — HEPARIN (PORCINE) IN NACL 100-0.45 UNIT/ML-% IJ SOLN
12.0000 [IU]/kg/h | INTRAMUSCULAR | Status: DC
  Administered 2012-03-20: 12 [IU]/kg/h via INTRAVENOUS
  Filled 2012-03-20: qty 250

## 2012-03-20 MED ORDER — SODIUM CHLORIDE 0.9 % IJ SOLN: 3.0000 mL | INTRAMUSCULAR | Status: DC | PRN

## 2012-03-20 MED ORDER — HYDROXYZINE HCL 25 MG PO TABS: 25.0000 mg | ORAL_TABLET | Freq: Four times a day (QID) | ORAL | Status: DC | PRN

## 2012-03-20 MED ORDER — NITROGLYCERIN 0.4 MG SL SUBL: 0.4000 mg | SUBLINGUAL_TABLET | SUBLINGUAL | Status: DC | PRN

## 2012-03-20 MED ORDER — HEPARIN (PORCINE) IN NACL 100-0.45 UNIT/ML-% IJ SOLN
1000.0000 [IU]/h | INTRAMUSCULAR | Status: DC
  Filled 2012-03-20: qty 250

## 2012-03-20 MED ORDER — FERROUS SULFATE 324 (65 FE) MG PO TBEC
1.0000 | DELAYED_RELEASE_TABLET | Freq: Two times a day (BID) | ORAL | Status: DC
  Administered 2012-03-20 – 2012-03-24 (×8): 325 mg via ORAL
  Filled 2012-03-20 (×10): qty 1

## 2012-03-20 MED ORDER — ACETAMINOPHEN 500 MG PO TABS
500.0000 mg | ORAL_TABLET | Freq: Four times a day (QID) | ORAL | Status: DC | PRN
  Administered 2012-03-20 – 2012-03-22 (×5): 500 mg via ORAL
  Filled 2012-03-20 (×7): qty 1

## 2012-03-20 MED ORDER — VITAMIN C 500 MG PO TABS
500.0000 mg | ORAL_TABLET | Freq: Two times a day (BID) | ORAL | Status: DC
  Administered 2012-03-20 – 2012-03-24 (×8): 500 mg via ORAL
  Filled 2012-03-20 (×10): qty 1

## 2012-03-20 MED ORDER — PERFLUTREN LIPID MICROSPHERE
1.0000 mL | INTRAVENOUS | Status: AC | PRN
  Administered 2012-03-20: 2 mL via INTRAVENOUS
  Filled 2012-03-20: qty 10

## 2012-03-20 MED ORDER — CLOPIDOGREL BISULFATE 75 MG PO TABS
75.0000 mg | ORAL_TABLET | Freq: Every day | ORAL | Status: DC
  Administered 2012-03-20 – 2012-03-21 (×2): 75 mg via ORAL
  Filled 2012-03-20 (×2): qty 1

## 2012-03-20 MED ORDER — MORPHINE SULFATE 2 MG/ML IJ SOLN: 2.0000 mg | INTRAMUSCULAR | Status: DC | PRN

## 2012-03-20 MED ORDER — MORPHINE SULFATE 2 MG/ML IJ SOLN
2.0000 mg | Freq: Once | INTRAMUSCULAR | Status: AC
  Administered 2012-03-20: 2 mg via INTRAVENOUS
  Filled 2012-03-20: qty 1

## 2012-03-20 MED ORDER — ASPIRIN 81 MG PO CHEW
324.0000 mg | CHEWABLE_TABLET | ORAL | Status: AC
  Administered 2012-03-21: 324 mg via ORAL
  Filled 2012-03-20: qty 4

## 2012-03-20 MED ORDER — IRBESARTAN 150 MG PO TABS
150.0000 mg | ORAL_TABLET | Freq: Every day | ORAL | Status: DC
  Administered 2012-03-20: 150 mg via ORAL
  Filled 2012-03-20 (×2): qty 1

## 2012-03-20 MED ORDER — FUROSEMIDE 40 MG PO TABS
40.0000 mg | ORAL_TABLET | Freq: Every day | ORAL | Status: DC
  Administered 2012-03-20 – 2012-03-24 (×4): 40 mg via ORAL
  Filled 2012-03-20 (×5): qty 1

## 2012-03-20 NOTE — ED Notes (Signed)
Pt complaining of returning chest pain after being assisted onto bedpan. Nitro upped to 15mcg/min at this time.

## 2012-03-20 NOTE — ED Notes (Signed)
Pt states chest pain is back and is radiating to her jaw and throat. Nitro upped to 38mcg/min at this time. BP 125/53 at this time.

## 2012-03-20 NOTE — ED Notes (Signed)
Nitro upped to 58mcg/min chest pain persists

## 2012-03-20 NOTE — ED Provider Notes (Signed)
History     CSN: [id number]  Arrival date & time 03/20/12  0234   First MD Initiated Contact with Patient 03/20/12 0251      Chief Complaint  Patient presents with  . Chest Pain    (Consider location/radiation/quality/duration/timing/severity/associated sxs/prior treatment) HPI  Alexandra [name] is a 78 y.o. female with a h/o NSTEMI, stent placement and unstable angina with PTCA 11/2011 who presents to the Emergency Department complaining of mid sternal chest pain that she has been experiencing since Wednesday which got worse tonight. She took a total of three SL NTG 45 minutes apart, each one relieving the pain with the pain returning. Pain was associated with radiation to her neck, burping and burning to her throat. She also experienced mild nausea, no diaphoresis.  PCP Dr. Kriste [facility]que [facility] Past Medical History  Diagnosis Date  . Asthmatic bronchitis   . Sarcoidosis   . HTN (hypertension)   . PVD (peripheral vascular disease)   . CAD (coronary artery disease) 03/2011    a. NSTEMI 03/2011 complicated by pulm edema s/p DES to subtotal LAD. b. [place] s/p PTCA/cutting balloon to prox LAD for ISR 12/01/11 (med rx for RCA/Cx dz).   . Hyperlipemia   . DM (diabetes mellitus)   . Obesity   . GERD (gastroesophageal reflux disease)   . Diverticulosis of colon   . IBS (irritable bowel syndrome)   . DJD (degenerative joint disease)     Hip and knee pain; hip bursitis; patellar subluxation  . Vitamin D deficiency disease   . Fibromyalgia   . Anxiety   . Intermittent vertigo   . Anemia   . Venous insufficiency   . PSVT (paroxysmal supraventricular tachycardia)     Prior hx of palpitations - developed SVT during stress Myoview 09/2011, spontaneously reverted back to NSR    Past Surgical History  Procedure Date  . Cholecystectomy   . Tonsillectomy   . Hernia repair   . Cataract extraction     Family History  Problem Relation Age of Onset  . Heart failure Father     . Leukemia Mother     History  Substance Use Topics  . Smoking status: Never Smoker   . Smokeless tobacco: Never Used  . Alcohol Use: Yes     social    OB History    Grav Para Term Preterm Abortions TAB SAB Ect Mult Living                  Review of Systems  Constitutional: Negative for fever.       10 Systems reviewed and are negative for acute change except as noted in the HPI.  HENT: Negative for congestion.   Eyes: Negative for discharge and redness.  Respiratory: Negative for cough and shortness of breath.   Cardiovascular: Positive for chest pain.  Gastrointestinal: Negative for vomiting and abdominal pain.  Musculoskeletal: Negative for back pain.  Skin: Negative for rash.  Neurological: Negative for syncope, numbness and headaches.  Psychiatric/Behavioral:       No behavior change.    Allergies  Codeine; Diazepam; Lipitor; Penicillins; and Sulfonamide derivatives  Home Medications   Current Outpatient Rx  Name Route Sig Dispense Refill  . ACETAMINOPHEN 500 MG PO TABS Oral Take 500 mg by mouth every 6 (six) hours as needed.      [name] AMITRIPTYLINE HCL 10 MG PO TABS Oral Take 1 tablet (10 mg total) by mouth at bedtime. 30 tablet 11  . AMLODIPINE  BESYLATE 2.5 MG PO TABS Oral Take 2.5 mg by mouth at bedtime.    . ASCORBIC ACID 500 MG PO TABS Oral Take 500 mg by mouth 2 (two) times daily. Twice daily with iron tablet    . ASPIRIN EC 81 MG PO TBEC Oral Take 81 mg by mouth every morning.     [name] VITAMIN D 1000 UNITS PO TABS Oral Take 1,000 Units by mouth daily.    [name] CLOPIDOGREL BISULFATE 75 MG PO TABS Oral Take 75 mg by mouth every morning.    . ECONAZOLE NITRATE 1 % EX CREA Topical Apply 1 application topically daily as needed. FOR FEET IRRITATION    . FERROUS SULFATE 324 (65 FE) MG PO TBEC Oral Take 1 tablet by mouth 2 (two) times daily.     . FUROSEMIDE 40 MG PO TABS Oral Take 40 mg by mouth daily.    [name] GLUCOSE BLOOD VI STRP Other 1 each by Other route as needed. Use  as instructed     . HYDROXYZINE HCL 25 MG PO TABS Oral Take 25 mg by mouth every 6 (six) hours as needed. For itching or anxiety    . IRBESARTAN 150 MG PO TABS Oral Take 150 mg by mouth daily.    [name] LORAZEPAM 0.5 MG PO TABS  Take 1/2 to 1 tablet by mouth three times daily as needed for nerves 90 tablet 5  . MECLIZINE HCL 25 MG PO TABS Oral Take 25 mg by mouth 3 (three) times daily as needed.    [name] METFORMIN HCL 500 MG PO TABS Oral Take 500 mg by mouth daily with breakfast.    . METOPROLOL TARTRATE 50 MG PO TABS  Take  1 1/2 tablets (75 mg ) twice daily    . NITROGLYCERIN 0.4 MG SL SUBL Sublingual Place 1 tablet (0.4 mg total) under the tongue every 5 (five) minutes x 3 doses as needed.      For chest pain  . PANTOPRAZOLE SODIUM 40 MG PO TBEC Oral Take 1 tablet (40 mg total) by mouth 2 (two) times daily. 60 tablet 6    Increase in dose/please deliver to pt's address./L .[name].  . SIMVASTATIN 40 MG PO TABS  TAKE ONE TABLET BY MOUTH AT BEDTIME. 30 tablet 6  . VITAMIN B-12 1000 MCG PO TABS Oral Take 1,000 mcg by mouth every morning.      BP 140/68  Pulse 81  Resp 15  Ht 5\' 6"  (1.676 m)  Wt 184 lb (83.462 kg)  BMI 29.70 kg/m2  SpO2 98%  Physical Exam  Nursing note and vitals reviewed. Constitutional: She is oriented to person, place, and time. She appears well-developed and well-nourished.       Awake, alert, nontoxic appearance.  HENT:  Head: Atraumatic.  Right Ear: External ear normal.  Left Ear: External ear normal.  Mouth/Throat: Oropharynx is clear and moist.  Eyes: Right eye exhibits no discharge. Left eye exhibits no discharge.  Neck: Normal range of motion. Neck supple. No JVD present.  Cardiovascular: Normal heart sounds.   Pulmonary/Chest: Effort normal and breath sounds normal. She exhibits no tenderness.  Abdominal: Soft. Bowel sounds are normal. There is no tenderness. There is no rebound.  Musculoskeletal: She exhibits no tenderness.       Baseline ROM, no obvious new focal  weakness.  Neurological: She is alert and oriented to person, place, and time.       Mental status and motor strength appears baseline for patient and  situation.  Skin: No rash noted.  Psychiatric: She has a normal mood and affect.    ED Course  Procedures (including critical care time)  Results for orders placed during the hospital encounter of 03/20/12  COMPREHENSIVE METABOLIC PANEL      Component Value Range   Sodium 132 (*) 135 - 145 mEq/L   Potassium 3.8  3.5 - 5.1 mEq/L   Chloride 93 (*) 96 - 112 mEq/L   CO2 27  19 - 32 mEq/L   Glucose, Bld 138 (*) 70 - 99 mg/dL   BUN 28 (*) 6 - 23 mg/dL   Creatinine, Ser 1.61 (*) 0.50 - 1.10 mg/dL   Calcium 9.9  8.4 - 09.6 mg/dL   Total Protein 7.9  6.0 - 8.3 g/dL   Albumin 4.2  3.5 - 5.2 g/dL   AST 15  0 - 37 U/L   ALT 7  0 - 35 U/L   Alkaline Phosphatase 80  39 - 117 U/L   Total Bilirubin 0.5  0.3 - 1.2 mg/dL   GFR calc non Af Amer 39 (*) >90 mL/min   GFR calc Af Amer 46 (*) >90 mL/min  CBC WITH DIFFERENTIAL      Component Value Range   WBC 7.7  4.0 - 10.5 K/uL   RBC 3.75 (*) 3.87 - 5.11 MIL/uL   Hemoglobin 11.3 (*) 12.0 - 15.0 g/dL   HCT 04.5 (*) 40.9 - 81.1 %   MCV 87.2  78.0 - 100.0 fL   MCH 30.1  26.0 - 34.0 pg   MCHC 34.6  30.0 - 36.0 g/dL   RDW 91.4  78.2 - 95.6 %   Platelets 173  150 - 400 K/uL   Neutrophils Relative 73  43 - 77 %   Neutro Abs 5.6  1.7 - 7.7 K/uL   Lymphocytes Relative 15  12 - 46 %   Lymphs Abs 1.2  0.7 - 4.0 K/uL   Monocytes Relative 5  3 - 12 %   Monocytes Absolute 0.4  0.1 - 1.0 K/uL   Eosinophils Relative 6 (*) 0 - 5 %   Eosinophils Absolute 0.5  0.0 - 0.7 K/uL   Basophils Relative 0  0 - 1 %   Basophils Absolute 0.0  0.0 - 0.1 K/uL  TROPONIN I      Component Value Range   Troponin I <0.30  <0.30 ng/mL    Dg Chest Portable 1 View  03/20/2012  *RADIOLOGY REPORT*  Clinical Data: Chest pain and shortness of breath.  PORTABLE CHEST - 1 VIEW  Comparison: 11/30/2011  Findings: Cardiac  enlargement with pulmonary vascular congestion similar to previous study.  Central pulmonary arteries are prominent which may suggest pulmonary hypertension.  Since the previous study, there is development of interstitial changes suggesting edema.  No focal airspace consolidation.  No blunting of costophrenic angles.  No pneumothorax.  Tortuous aorta with calcification.  IMPRESSION: Cardiac enlargement with pulmonary vascular congestion and developing interstitial edema.   Original Report Authenticated By: Marlon Pel, M.D.     Date: 03/20/2012  2130  Rate: 78  Rhythm: normal sinus rhythm  QRS Axis: normal  Intervals: normal  ST/T Wave abnormalities: ST depressions inferiorly  Conduction Disutrbances:none  Narrative Interpretation:   Old EKG Reviewed: changes noted c/w 12/02/11 ST depression in anterior and lateral leads now present  #2 After IV heparin and NTG   Date: 03/20/2012  0348  Rate: 69  Rhythm: normal sinus rhythm and premature atrial  contractions (PAC)  QRS Axis: normal  Intervals: normal  ST/T Wave abnormalities: normal  Conduction Disutrbances:none  Narrative Interpretation:   Old EKG Reviewed: ST depression in anterior and lateral leads has resolved  5:36 AM:  T/C to Dr. Charm Barges, cardiology, case discussed, including:  HPI, pertinent PM/SHx, VS/PE, dx testing, ED course and treatment.  Agreeable to transfer to [facility].  Step down bed. [facility] to transport.   MDM   Patient with h/o [place] and NSTEMI here with progressive chest pain since Wednesday requiring NTG tonight with recurrent pain. EKG changes initially that normalized with initiation of heparin and ntg. Pain free except when she was up to the bedside commode. Troponin negative x 1. Spoke with Dr. Charm Barges, cardiologist at [facility] who will take the patient in transfer. Dx testing d/w pt.   Questions answered.  Verb understanding, agreeable to transfer to [facility]. Pt stable in ED with no significant deterioration in condition. The  patient appears reasonably stabilized for transfer considering the current resources, flow, and capabilities available in the ED at this time, and I doubt any other [facility] requiring further screening and/or treatment in the ED prior to transfer.  CRITICAL CARE Performed by: Annamarie Dawley. Total critical care time: 40 Critical care time was exclusive of separately billable procedures and treating other patients. Critical care was necessary to treat or prevent imminent or life-threatening deterioration. Critical care was time spent personally by me on the following activities: development of treatment plan with patient and/or surrogate as well as nursing, discussions with consultants, evaluation of patient's response to treatment, examination of patient, obtaining history from patient or surrogate, ordering and performing treatments and interventions, ordering and review of laboratory studies, ordering and review of radiographic studies, pulse oximetry and re-evaluation of patient's condition.  MDM Reviewed: previous chart, nursing note and vitals Reviewed previous: labs, ECG and x-ray Interpretation: labs, ECG and x-ray Consults: cardiology          Nicoletta Dress. Colon Branch, MD 03/20/12 [phone]

## 2012-03-20 NOTE — ED Notes (Signed)
Chest pain 8/10, nitro upped to 75mcg/min

## 2012-03-20 NOTE — ED Notes (Signed)
Alexandra [name] in room assisting pt on to bedpan at this time.

## 2012-03-20 NOTE — Progress Notes (Signed)
  Echocardiogram 2D Echocardiogram with Definity has been performed.  Rollings, CALEB  03/20/2012, 4:03 PM

## 2012-03-20 NOTE — ED Notes (Signed)
Patient complaining of central chest pain and epigastric pain. Also reports frequent burping, burning in throat.

## 2012-03-20 NOTE — ED Notes (Signed)
Pt states chest pain is greatly relieved, states pain is only 2/10 at this time. Pt AAx4 and talking with staff. NAD noted

## 2012-03-20 NOTE — ED Notes (Signed)
Repeat EKG completed, given to Dr Colon Branch

## 2012-03-20 NOTE — Progress Notes (Addendum)
ANTICOAGULATION CONSULT NOTE - Follow Up Consult  Pharmacy Consult for Heparin Indication: chest pain/ACS  Allergies  Allergen Reactions  . Codeine Other (See Comments)    Reaction: severe GI pain  . Diazepam Other (See Comments)    REACTION: causes pt unable to wake up after taking  . Lipitor (Atorvastatin Calcium) Other (See Comments)    Reaction: causes vertigo  . Penicillins Other (See Comments)    Reaction: unknown (childhood )  . Sulfonamide Derivatives Other (See Comments)    Reaction: unknown (childhood)  . Mango Flavor Rash    Patient Measurements: Height: 5\' 6"  (167.6 cm) Weight: 184 lb (83.462 kg) IBW/kg (Calculated) : 59.3    Vital Signs: Temp: 98.3 F (36.8 C) (10/20 1605) Temp src: Oral (10/20 1605) BP: 101/41 mmHg (10/20 1345) Pulse Rate: 73  (10/20 1345)  Labs:  Basename 03/20/12 1630 03/20/12 1100 03/20/12 0545 03/20/12 0254  HGB -- -- -- 11.3*  HCT -- -- -- 32.7*  PLT -- -- -- 173  APTT -- -- -- --  LABPROT -- 15.7* -- --  INR -- 1.28 -- --  HEPARINUNFRC 1.31* -- -- --  CREATININE -- -- -- 1.21*  CKTOTAL -- -- -- --  CKMB -- -- -- --  TROPONINI -- 0.43* <0.30 <0.30    Estimated Creatinine Clearance: 36.4 ml/min (by C-G formula based on Cr of 1.21).   Medications:  Prescriptions prior to admission  Medication Sig Dispense Refill  . acetaminophen (TYLENOL) 500 MG tablet Take 500 mg by mouth every 6 (six) hours as needed.        [name] amitriptyline (ELAVIL) 10 MG tablet Take 1 tablet (10 mg total) by mouth at bedtime.  30 tablet  11  . amLODipine (NORVASC) 2.5 MG tablet Take 2.5 mg by mouth at bedtime.      [name] ascorbic acid (VITAMIN C) 500 MG tablet Take 500 mg by mouth 2 (two) times daily. Twice daily with iron tablet      . aspirin EC 81 MG tablet Take 81 mg by mouth every morning.       . cholecalciferol (VITAMIN D) 1000 UNITS tablet Take 1,000 Units by mouth daily.      . clopidogrel (PLAVIX) 75 MG tablet Take 75 mg by mouth every morning.       [name] econazole nitrate 1 % cream Apply 1 application topically daily as needed. FOR FEET IRRITATION      . ferrous sulfate 324 (65 FE) MG TBEC Take 1 tablet by mouth 2 (two) times daily.       . furosemide (LASIX) 40 MG tablet Take 40 mg by mouth daily.      [name] glucose blood test strip 1 each by Other route as needed. Use as instructed       . hydrOXYzine (ATARAX/VISTARIL) 25 MG tablet Take 25 mg by mouth every 6 (six) hours as needed. For itching or anxiety      . irbesartan (AVAPRO) 150 MG tablet Take 150 mg by mouth daily.      . metFORMIN (GLUCOPHAGE) 500 MG tablet Take 500 mg by mouth daily with breakfast.      . metoprolol (LOPRESSOR) 50 MG tablet Take  1 1/2 tablets (75 mg ) twice daily      . nitroGLYCERIN (NITROSTAT) 0.4 MG SL tablet Place 1 tablet (0.4 mg total) under the tongue every 5 (five) minutes x 3 doses as needed.      . pantoprazole (PROTONIX) 40 MG tablet Take  1 tablet (40 mg total) by mouth 2 (two) times daily.  60 tablet  6  . simvastatin (ZOCOR) 40 MG tablet Take 40 mg by mouth every evening.      . vitamin B-12 (CYANOCOBALAMIN) 1000 MCG tablet Take 1,000 mcg by mouth every morning.        Assessment: 78 yo lady transferred from [facility] on heparin for ACS.  Her initial heparin level is 1.31 units/ml. Goal of Therapy:  Heparin level 0.3-0.7 units/ml Monitor platelets by anticoagulation protocol: Yes   Plan:  Will hold heparin for 1 hours and restart at 800 units/hr. Check HL 8 hours after restart.  Thanks for allowing pharmacy to be a part of this patient's care.  Talbert Cage, PharmD Clinical Pharmacist, [phone]    ,

## 2012-03-20 NOTE — Progress Notes (Signed)
Alexandra [name] is currently on heparin protocol for a diagnosis of ACS.  Current Labs: Hematology Lab Results  Component Value Date/Time   WBC 7.7 03/20/2012  2:54 AM   RBC 3.75* 03/20/2012  2:54 AM   HGB 11.3* 03/20/2012  2:54 AM   HCT 32.7* 03/20/2012  2:54 AM   MCV 87.2 03/20/2012  2:54 AM   MCH 30.1 03/20/2012  2:54 AM   Platelets  Date Value Range Status  03/20/2012 173  150 - 400 K/uL Final   No results found for this basename: PTT   INR  Date Value Range Status  12/01/2011 1.07  0.00 - 1.49 Final   Heparin bolus of 4000 units given and drip started at 12 units/kg/hr.  Will check heparin level at 1100 on 10/20- approx. 8 hours after heparin drip initiation.  Goal heparin level is 0.3-0.7 units/mL.  Will monitor daily CBC.     Isaias Sakai [place]

## 2012-03-20 NOTE — Progress Notes (Signed)
CRITICAL VALUE ALERT  Critical value received:  trop  Date of notification:  03/20/2012  Time of notification:  1204  Critical value read back:yes  Nurse who received alert:  abarlow  MD notified (1st page):  Annabelle Harman dunne/pa  Time of first page:  1208  MD notified (2nd page):  Time of second page:  Responding MD:  Annabelle Harman dunne/pa  Time MD responded:  1210

## 2012-03-20 NOTE — H&P (Signed)
History and Physical  Patient ID: SHANIECE BUSSA MRN: [id number], DOB: 04-22-1924 Date of Encounter: 03/20/2012, 9:31 AM Primary Physician: [name], MD Primary Cardiologist: Dr. Dietrich Pates  Chief Complaint: chest pain  Reason for Admission: CP, dynamic EKG changes  HPI: Ms. Mahurin is an 78 y/o F with hx of CAD (NSTEMI 03/2011 s/p DES to subtotal LAD, PTCA/cutting balloon to prox LAD for ISR 12/01/11 with residual disease), DM, HTN, mild AS 03/2011, remote sarcoidosis, and mild renal insufficiency who presented initially to [facility] with complaints of chest pain reminiscent of her prior angina. She has been feeling intermittent substernal chest discomfort radiating to her throat like a "throbbing" down into her arms for the past 4 days. She primarily feels this when she gets up and walks around, but it has also been waking her out of sleep the last 2 nights. She feels the need to belch but cannot always do so. She has had some orthopnea. No LEE. She reports losing weight over the last year that she attributes to not eating like he used to. No melena, hematemesis, BRBPR, hematuria, fever, chills, or cough. She had a particularly severe episode of CP last night for which she took 3 NTG at home with relief, and thus came to the ER. EKG in the setting of CP demonstrated NSR 78bpm with marked ST depression V2-V6 (up to 2mm in V4) as well as ST/T abnormalities in I, II, avL, avF. F/u EKG this AM while pain free demonstrates interval resolution of changes.  CXR: cardiac enlargement with pulm vasc congestion and developing interstitial edema. VSS. Not tachycardic, tachypnic or hypoxic. Troponin neg x 2. Labs significant for Hgb 11.3 (improved from 11/2011), ?Na 132, ?BUN/Cr 28/1.21 (CrCl 68mL/min). She received 324mg  of aspirin, heparin per pharmacy initiation, 2mg  of morphine and was started on NTG gtt and is currently comfortable.    Past Medical History  Diagnosis Date  . Asthmatic bronchitis   . Sarcoidosis      a. 1990s of lung tx with prednisone temporarily.  [name] HTN (hypertension)   . PVD (peripheral vascular disease)     a. Carotid dopp 07/2011: mild-mod atherosclerotic plaque bilaterally, 50-69% .  [name] CAD (coronary artery disease) 03/2011    a. NSTEMI 03/2011 complicated by pulm edema s/p DES to subtotal LAD. b. [place] s/p PTCA/cutting balloon to prox LAD for ISR 12/01/11 (med rx for RCA/Cx dz).   . Hyperlipemia   . DM (diabetes mellitus)   . Obesity   . GERD (gastroesophageal reflux disease)   . Diverticulosis of colon   . IBS (irritable bowel syndrome)   . DJD (degenerative joint disease)     Hip and knee pain; hip bursitis; patellar subluxation  . Vitamin D deficiency disease   . Fibromyalgia   . Anxiety   . Intermittent vertigo   . Anemia   . Venous insufficiency   . PSVT (paroxysmal supraventricular tachycardia)     a. Prior hx of palpitations - developed SVT during stress Myoview 09/2011, spontaneously reverted back to NSR.  [name] Aortic stenosis     a. Mild by echo 03/2011.     Most Recent Cardiac Studies: 2D Echo 03/23/11 Study Conclusions - Left ventricle: The cavity size was normal. There was mild to moderate concentric hypertrophy. Systolic function was normal. The estimated ejection fraction was 55%. Severe hypokinesis to akinesis of the distal anteroseptal and apical myocardium. - Aortic valve: Mildly to moderately calcified annulus. Normal thickness, mildly to oderately calcified leaflets. Cusp separation was  mildly reduced. There was mild stenosis. - Mitral valve: Severely calcified annulus. Leaflets not well seen-probably normal thickness. - Left atrium: The atrium was moderately dilated. - Atrial septum: No defect or patent foramen ovale was identified.   Surgical History:  Past Surgical History  Procedure Date  . Cholecystectomy   . Tonsillectomy   . Hernia repair   . Cataract extraction      Home Meds: Prior to Admission medications   Medication Sig Start Date End  Date Taking? Authorizing Provider  acetaminophen (TYLENOL) 500 MG tablet Take 500 mg by mouth every 6 (six) hours as needed.      Historical Provider, MD  amitriptyline (ELAVIL) 10 MG tablet Take 1 tablet (10 mg total) by mouth at bedtime. 01/13/12 01/12/13  [name], MD  amLODipine (NORVASC) 2.5 MG tablet Take 2.5 mg by mouth at bedtime. 07/21/11 07/20/12  Kathlen Brunswick, MD  ascorbic acid (VITAMIN C) 500 MG tablet Take 500 mg by mouth 2 (two) times daily. Twice daily with iron tablet    Historical Provider, MD  aspirin EC 81 MG tablet Take 81 mg by mouth every morning.     Historical Provider, MD  cholecalciferol (VITAMIN D) 1000 UNITS tablet Take 1,000 Units by mouth daily.    Historical Provider, MD  clopidogrel (PLAVIX) 75 MG tablet Take 75 mg by mouth every morning. 10/15/11   [name], MD  econazole nitrate 1 % cream Apply 1 application topically daily as needed. FOR FEET IRRITATION    Historical Provider, MD  ferrous sulfate 324 (65 FE) MG TBEC Take 1 tablet by mouth 2 (two) times daily.     Historical Provider, MD  furosemide (LASIX) 40 MG tablet Take 40 mg by mouth daily. 11/10/11   [name], MD  glucose blood test strip 1 each by Other route as needed. Use as instructed     Historical Provider, MD  hydrOXYzine (ATARAX/VISTARIL) 25 MG tablet Take 25 mg by mouth every 6 (six) hours as needed. For itching or anxiety    Historical Provider, MD  irbesartan (AVAPRO) 150 MG tablet Take 150 mg by mouth daily.    Historical Provider, MD  LORazepam (ATIVAN) 0.5 MG tablet Take 1/2 to 1 tablet by mouth three times daily as needed for nerves 12/07/11   [name], MD  meclizine (ANTIVERT) 25 MG tablet Take 25 mg by mouth 3 (three) times daily as needed.    Historical Provider, MD  metFORMIN (GLUCOPHAGE) 500 MG tablet Take 500 mg by mouth daily with breakfast. 12/07/11   [name], MD  metoprolol (LOPRESSOR) 50 MG tablet Take  1 1/2 tablets (75 mg ) twice daily    Jodelle Gross, NP    nitroGLYCERIN (NITROSTAT) 0.4 MG SL tablet Place 1 tablet (0.4 mg total) under the tongue every 5 (five) minutes x 3 doses as needed. 12/04/11 12/03/12  Dayna N Dunn, PA  pantoprazole (PROTONIX) 40 MG tablet Take 1 tablet (40 mg total) by mouth 2 (two) times daily. 12/15/11 12/14/12  Jodelle Gross, NP  simvastatin (ZOCOR) 40 MG tablet TAKE ONE TABLET BY MOUTH AT BEDTIME. 02/11/12   [name], MD  vitamin B-12 (CYANOCOBALAMIN) 1000 MCG tablet Take 1,000 mcg by mouth every morning.    Historical Provider, MD    Allergies:  Allergies  Allergen Reactions  . Codeine Other (See Comments)    Reaction: severe GI pain  . Diazepam Other (See Comments)    REACTION:  causes pt unable to wake up after taking  . Lipitor (Atorvastatin Calcium) Other (See Comments)    Reaction: causes vertigo  . Penicillins Other (See Comments)    Reaction: unknown (childhood )  . Sulfonamide Derivatives Other (See Comments)    Reaction: unknown (childhood)    History   Social History  . Marital Status: Widowed    Spouse Name: N/A    Number of Children: 1  . Years of Education: N/A   Occupational History  . Not on file.   Social History Main Topics  . Smoking status: Never Smoker   . Smokeless tobacco: Never Used  . Alcohol Use: Yes     social  . Drug Use: No  . Sexually Active: Not on file   Other Topics Concern  . Not on file   Social History Narrative   Pt is only child. Husband Duanne Guess passed away in 2004Pt has 1 son in [place](his wife is MD doing [employer] Research )      Family History  Problem Relation Age of Onset  . Heart failure Father   . Leukemia Mother     Review of Systems: General: negative for chills (but "stays cold", fever, night sweats  Cardiovascular: no palpitations. See above Dermatological: negative for rash Respiratory: negative for cough or wheezing Urologic: negative for hematuria Abdominal: negative for nausea, vomiting, diarrhea, bright red blood per rectum, melena, or  hematemesis. H/o umbilical hernia Neurologic: negative for visual changes, syncope, or dizziness All other systems reviewed and are otherwise negative except as noted above.  Labs:   Lab Results  Component Value Date   WBC 7.7 03/20/2012   HGB 11.3* 03/20/2012   HCT 32.7* 03/20/2012   MCV 87.2 03/20/2012   PLT 173 03/20/2012     Lab 03/20/12 0254  NA 132*  K 3.8  CL 93*  CO2 27  BUN 28*  CREATININE 1.21*  CALCIUM 9.9  PROT 7.9  BILITOT 0.5  ALKPHOS 80  ALT 7  AST 15  GLUCOSE 138*    Basename 03/20/12 0545 03/20/12 0254  CKTOTAL -- --  CKMB -- --  TROPONINI <0.30 <0.30   Lab Results  Component Value Date   CHOL 112 08/25/2011   HDL 46.00 08/25/2011   LDLCALC 35 08/25/2011   TRIG 157.0* 08/25/2011   Radiology/Studies:  Dg Chest Portable 1 View 03/20/2012  *RADIOLOGY REPORT*  Clinical Data: Chest pain and shortness of breath.  PORTABLE CHEST - 1 VIEW  Comparison: 11/30/2011  Findings: Cardiac enlargement with pulmonary vascular congestion similar to previous study.  Central pulmonary arteries are prominent which may suggest pulmonary hypertension.  Since the previous study, there is development of interstitial changes suggesting edema.  No focal airspace consolidation.  No blunting of costophrenic angles.  No pneumothorax.  Tortuous aorta with calcification.  IMPRESSION: Cardiac enlargement with pulmonary vascular congestion and developing interstitial edema.   Original Report Authenticated By: Marlon Pel, M.D.     EKG: EKG in the setting of CP at 2:37am at [facility] (not in epic) demonstrated NSR 78bpm with marked ST depression V2-V6 (up to 2mm in V4) as well as ST/T abnormalities in I, II, avL, avF. F/u EKG this AM while pain free demonstrates interval resolution of changes.  Physical Exam: Blood pressure 138/59, pulse 77, temperature 97.8 F (36.6 C), temperature source Oral, resp. rate 18, height 5\' 6"  (1.676 m), weight 184 lb (83.462 kg), SpO2 99.00%. General: Well  developed, well nourished [name] in no acute  distress. Head: Normocephalic, atraumatic, sclera non-icteric, no xanthomas, nares are without discharge.  Neck: +R carotid bruit. JVD not elevated. Lungs: Mild exp wheezes at bases with decreased BS at bases, otherwise no rales or rhonchi. Breathing is unlabored. Heart: RRR with S1 S2. No Mild SEM RUSB c/w AS, preserved S2. No rubs rubs or gallops appreciated. Abdomen: Soft, non-tender, non-distended with normoactive bowel sounds. No hepatomegaly. No rebound/guarding. No obvious abdominal masses. Msk:  Strength and tone appear normal for age. Extremities: No clubbing or cyanosis. No edema.  Distal pedal pulses are 2+ and equal bilaterally. Neuro: Alert and oriented X 3. No focal deficit. No facial asymmetry. Moves all extremities spontaneously. Psych:  Responds to questions appropriately with a normal affect.    ASSESSMENT AND PLAN:  Ms. Bessinger is an 78 y/o F with hx of CAD (NSTEMI 03/2011 s/p DES to subtotal LAD, PTCA/cutting balloon to prox LAD for ISR 12/01/11 with residual disease), DM, HTN, mild AS 03/2011, remote sarcoidosis, and mild renal insufficiency who presents to [facility] with CP reminiscent of her prior angina and transient significant ST depression on EKG.  1. Unstable angina with history of CAD  2. Aortic stenosis, mild by echo 03/2011 3. Diabetes mellitus 4. HTN  5. Carotid dz by dopplers 07/2011, will need f/u as OP 6. Anemia - stable, improved from prior values  Admit, continue heparin, cycle cardiac enzymes. EKG changes are worrisome for underlying ischemia. Check P2Y12 given Plavix. Continue home medications with addition of nitro drip, except hold Metformin in prep for cath. Follow BP. Repeat echo.  Signed, Ronie Spies PA-C 03/20/2012, 9:31 AM  I have seen, examined the patient, and reviewed the above assessment and plan.  Changes to above are made where necessary.  She presents with increasing angina at rest with clear  ST depression on EKG.  She is not chest pain free and ST segments have returned to baseline.  I am concerned that she may have again occluded her stent.  I think that P2Y12 should be obtained to better understand her platelet inhibition before we proceed with cath.  Risks, benefits, and alternatives to left heart cath with possible PCI were discussed at length with the patient today.  She accepts risks and wishes to proceed in am.  I have also spoken with her son in [place] by phone who is aware of the plan.  Continue medical optimization in the interim.  Co Sign: Hillis Range, MD 03/20/2012 9:52 AM

## 2012-03-20 NOTE — ED Notes (Signed)
Dr strand in room assessing pt at this time.

## 2012-03-20 NOTE — ED Notes (Signed)
Pt states chest pain now down to 3/10, pt comfortable and talking with staff/family

## 2012-03-21 ENCOUNTER — Encounter (HOSPITAL_COMMUNITY)
Admission: EM | Disposition: A | Discharge status: Home / Self Care | Payer: Self-pay | Source: Home / Self Care | Attending: Cardiology

## 2012-03-21 ENCOUNTER — Other Ambulatory Visit: Payer: Self-pay | Admitting: Pulmonary Disease

## 2012-03-21 DIAGNOSIS — I214 Non-ST elevation (NSTEMI) myocardial infarction: Secondary | ICD-10-CM | POA: Diagnosis present

## 2012-03-21 DIAGNOSIS — I2510 Atherosclerotic heart disease of native coronary artery without angina pectoris: Secondary | ICD-10-CM

## 2012-03-21 HISTORY — PX: PERCUTANEOUS CORONARY STENT INTERVENTION (PCI-S): SHX5485

## 2012-03-21 HISTORY — PX: CORONARY ANGIOPLASTY WITH STENT PLACEMENT: SHX49

## 2012-03-21 HISTORY — PX: LEFT HEART CATHETERIZATION WITH CORONARY ANGIOGRAM: SHX5451

## 2012-03-21 LAB — LIPID PANEL
LDL Cholesterol: 51 mg/dL (ref 0–99)
Triglycerides: 97 mg/dL (ref <150)
VLDL: 19 mg/dL (ref 0–40)

## 2012-03-21 LAB — BASIC METABOLIC PANEL
BUN: 27 mg/dL — ABNORMAL HIGH (ref 6–23)
Calcium: 8.8 mg/dL (ref 8.4–10.5)
Chloride: 100 mEq/L (ref 96–112)
Creatinine, Ser: 1.28 mg/dL — ABNORMAL HIGH (ref 0.50–1.10)
GFR calc Af Amer: 43 mL/min — ABNORMAL LOW (ref >90)

## 2012-03-21 LAB — GLUCOSE, CAPILLARY: Glucose-Capillary: 93 mg/dL (ref 70–99)

## 2012-03-21 LAB — CBC
HCT: 27 % — ABNORMAL LOW (ref 36.0–46.0)
MCH: 28.5 pg (ref 26.0–34.0)
MCHC: 33 g/dL (ref 30.0–36.0)
MCV: 86.5 fL (ref 78.0–100.0)
Platelets: 134 10*3/uL — ABNORMAL LOW (ref 150–400)
RDW: 13.6 % (ref 11.5–15.5)

## 2012-03-21 LAB — POCT ACTIVATED CLOTTING TIME: Activated Clotting Time: 614 seconds

## 2012-03-21 SURGERY — LEFT HEART CATHETERIZATION WITH CORONARY ANGIOGRAM
Anesthesia: LOCAL

## 2012-03-21 MED ORDER — METOPROLOL TARTRATE 25 MG PO TABS
25.0000 mg | ORAL_TABLET | Freq: Three times a day (TID) | ORAL | Status: DC
  Administered 2012-03-21 (×2): 25 mg via ORAL
  Filled 2012-03-21 (×3): qty 1

## 2012-03-21 MED ORDER — FENTANYL CITRATE 0.05 MG/ML IJ SOLN
INTRAMUSCULAR | Status: AC
  Filled 2012-03-21: qty 2

## 2012-03-21 MED ORDER — PRASUGREL HCL 5 MG PO TABS
5.0000 mg | ORAL_TABLET | Freq: Every day | ORAL | Status: DC
  Administered 2012-03-22 – 2012-03-24 (×3): 5 mg via ORAL
  Filled 2012-03-21 (×3): qty 1

## 2012-03-21 MED ORDER — SODIUM CHLORIDE 0.9 % IV SOLN: INTRAVENOUS | Status: DC

## 2012-03-21 MED ORDER — SODIUM CHLORIDE 0.9 % IV SOLN: INTRAVENOUS | Status: AC

## 2012-03-21 MED ORDER — BIVALIRUDIN 250 MG IV SOLR
INTRAVENOUS | Status: AC
  Filled 2012-03-21: qty 250

## 2012-03-21 MED ORDER — SIMVASTATIN 40 MG PO TABS: 40.0000 mg | ORAL_TABLET | Freq: Every evening | ORAL | Status: DC

## 2012-03-21 MED ORDER — SODIUM CHLORIDE 0.9 % IJ SOLN: 3.0000 mL | INTRAMUSCULAR | Status: DC | PRN

## 2012-03-21 MED ORDER — SODIUM CHLORIDE 0.9 % IV SOLN: 250.0000 mL | INTRAVENOUS | Status: DC | PRN

## 2012-03-21 MED ORDER — MIDAZOLAM HCL 2 MG/2ML IJ SOLN
INTRAMUSCULAR | Status: AC
  Filled 2012-03-21: qty 2

## 2012-03-21 MED ORDER — SODIUM CHLORIDE 0.9 % IJ SOLN: 3.0000 mL | Freq: Two times a day (BID) | INTRAMUSCULAR | Status: DC

## 2012-03-21 NOTE — Progress Notes (Signed)
Back from the cath. Lab by bed , awake and alert. Right groin dressing soaked with blood, dressing changed by cath lab staff and applied pressure dressing. Continue to monitor.

## 2012-03-21 NOTE — CV Procedure (Signed)
   Cardiac Catheterization Procedure Note  Name: Alexandra [name] MRN: [id number] DOB: [birth year]  Procedure: Left Heart Cath, Selective Coronary Angiography, LV angiography  Indication: NSTEMI with recent cutting balloon angioplasty to proximal LAD stent.    Procedural Details: Risks/benefits were carefully explained to the patient.  She is at higher risk with anemia and CKD. I guaiaced her today, she is guaiac negative.  The right wrist was prepped, draped, and anesthetized with 1% lidocaine. Using the modified Seldinger technique, a 5 French sheath was introduced into the right radial artery. 3 mg of verapamil was administered through the sheath, weight-based unfractionated heparin was administered intravenously. Standard Judkins catheters were used for selective coronary angiography and left ventriculography. Catheter exchanges were performed over an exchange length guidewire. There were no immediate procedural complications. A TR band was used for radial hemostasis at the completion of the procedure.  The patient was transferred to the post catheterization recovery area for further monitoring.  Procedural Findings: Hemodynamics: AO 104/44 LV 117/16  Coronary angiography: Coronary dominance: right  Left mainstem: Long vessel, 30% ostial stenosis, 20% mid vessel stenosis.   Left anterior descending (LAD): There was a proximal LAD stent with severe 95% in-stent restenosis.  Diffuse up to 50% stenosis in the mid LAD.   Left circumflex (LCx): Up to 70% ostial stenosis.  Diffuse up to 60-70% stenosis in the mid LCx.    Right coronary artery (RCA): Serial 70% and 80% mid RCA stenoses. Diffuse mild disease in the PDA/PLV.   Left ventriculography: Not done, CKD  Final Conclusions:  Severe in-stent restenosis in the proximal LAD, up to 95%.  She has diffuse severe but not critical disease in the RCA and moderate disease in the LCx.    Recommendations: Discussed at length with Dr. Clifton James.   Will plan Angiosculpt cutting balloon angioplasty today.  Will replace Plavix (poor platelet inhibition) with Effient at 5 mg daily. Will need to follow CBC closely.   [name] 03/21/2012, 10:49 AM

## 2012-03-21 NOTE — Telephone Encounter (Signed)
Received electronic refills request for pt's zocor 40mg  qd.  Pt last seen by SN 8.21.13.  Refills sent to pharmacy.

## 2012-03-21 NOTE — Progress Notes (Signed)
ANTICOAGULATION CONSULT NOTE - Follow Up Consult  Pharmacy Consult for Heparin Indication: chest pain/ACS  Allergies  Allergen Reactions  . Codeine Other (See Comments)    Reaction: severe GI pain  . Diazepam Other (See Comments)    REACTION: causes pt unable to wake up after taking  . Lipitor (Atorvastatin Calcium) Other (See Comments)    Reaction: causes vertigo  . Penicillins Other (See Comments)    Reaction: unknown (childhood )  . Sulfonamide Derivatives Other (See Comments)    Reaction: unknown (childhood)  . Mango Flavor Rash    Patient Measurements: Height: 5\' 6"  (167.6 cm) Weight: 184 lb (83.462 kg) IBW/kg (Calculated) : 59.3  Heparin dosing weight: 77 kg  Vital Signs: Temp: 98.1 F (36.7 C) (10/21 0000) Temp src: Oral (10/21 0000) BP: 99/48 mmHg (10/21 0000) Pulse Rate: 79  (10/21 0000)  Labs:  Basename 03/21/12 0112 03/21/12 0031 03/20/12 1630 03/20/12 1617 03/20/12 1100 03/20/12 0545 03/20/12 0254  HGB -- 8.9* -- -- -- -- 11.3*  HCT -- 27.0* -- -- -- -- 32.7*  PLT -- 134* -- -- -- -- 173  APTT -- -- -- -- -- -- --  LABPROT -- -- -- -- 15.7* -- --  INR -- -- -- -- 1.28 -- --  HEPARINUNFRC 0.83* -- 1.31* -- -- -- --  CREATININE -- 1.28* -- -- -- -- 1.21*  CKTOTAL -- -- -- -- -- -- --  CKMB -- -- -- -- -- -- --  TROPONINI -- -- -- 1.23* 0.43* <0.30 --    Estimated Creatinine Clearance: 34.4 ml/min (by C-G formula based on Cr of 1.28).   Medications:  Prescriptions prior to admission  Medication Sig Dispense Refill  . acetaminophen (TYLENOL) 500 MG tablet Take 500 mg by mouth every 6 (six) hours as needed.        [name] amitriptyline (ELAVIL) 10 MG tablet Take 1 tablet (10 mg total) by mouth at bedtime.  30 tablet  11  . amLODipine (NORVASC) 2.5 MG tablet Take 2.5 mg by mouth at bedtime.      [name] ascorbic acid (VITAMIN C) 500 MG tablet Take 500 mg by mouth 2 (two) times daily. Twice daily with iron tablet      . aspirin EC 81 MG tablet Take 81 mg by mouth  every morning.       . cholecalciferol (VITAMIN D) 1000 UNITS tablet Take 1,000 Units by mouth daily.      . clopidogrel (PLAVIX) 75 MG tablet Take 75 mg by mouth every morning.      [name] econazole nitrate 1 % cream Apply 1 application topically daily as needed. FOR FEET IRRITATION      . ferrous sulfate 324 (65 FE) MG TBEC Take 1 tablet by mouth 2 (two) times daily.       . furosemide (LASIX) 40 MG tablet Take 40 mg by mouth daily.      [name] glucose blood test strip 1 each by Other route as needed. Use as instructed       . hydrOXYzine (ATARAX/VISTARIL) 25 MG tablet Take 25 mg by mouth every 6 (six) hours as needed. For itching or anxiety      . irbesartan (AVAPRO) 150 MG tablet Take 150 mg by mouth daily.      . metFORMIN (GLUCOPHAGE) 500 MG tablet Take 500 mg by mouth daily with breakfast.      . metoprolol (LOPRESSOR) 50 MG tablet Take  1 1/2 tablets (75 mg ) twice  daily      . nitroGLYCERIN (NITROSTAT) 0.4 MG SL tablet Place 1 tablet (0.4 mg total) under the tongue every 5 (five) minutes x 3 doses as needed.      . pantoprazole (PROTONIX) 40 MG tablet Take 1 tablet (40 mg total) by mouth 2 (two) times daily.  60 tablet  6  . simvastatin (ZOCOR) 40 MG tablet Take 40 mg by mouth every evening.      . vitamin B-12 (CYANOCOBALAMIN) 1000 MCG tablet Take 1,000 mcg by mouth every morning.        Assessment: 78 yo lady transferred from [facility] on heparin for ACS. Heparin level (0.83) remains above-goal on 800 units/hr.  Hgb trending down, no s/sx of bleeding per RN. Hgb in similar range(~ 8 g/dL) in July 2013 and October 2012.   Goal of Therapy:  Heparin level 0.3-0.7 units/ml Monitor platelets by anticoagulation protocol: Yes   Plan:  1. Decrease IV heparin to 650 units/hr.  2. Heparin level in 8 hours.  3. Monitor for s/sx of bleeding.   Lorre Munroe, PharmD 03/21/12, 02:30 AM   ,

## 2012-03-21 NOTE — Progress Notes (Signed)
To the cath lab by bed, stable. 

## 2012-03-21 NOTE — Care Management Note (Signed)
    Page 1 of 1   03/21/2012     12:38:57 PM   CARE MANAGEMENT NOTE 03/21/2012  Patient:  Alexandra [name], Alexandra [name]   Account Number:  [id number]  Date Initiated:  03/21/2012  Documentation initiated by:  Junius Creamer  Subjective/Objective Assessment:   adm w ch pain, intervention in cath lab     Action/Plan:   lives alone, pcp dr Lorin Picket nadel   Anticipated DC Date:     Anticipated DC Plan:        DC Planning Services  CM consult      Choice offered to / List presented to:             Status of service:   Medicare Important Message given?   (If response is "NO", the following Medicare IM given date fields will be blank) Date Medicare IM given:   Date Additional Medicare IM given:    Discharge Disposition:  HOME/SELF CARE  Per UR Regulation:  Reviewed for med. necessity/level of care/duration of stay  If discussed at Long Length of Stay Meetings, dates discussed:    Comments:  10/21 12:37p debbie Trenee Igoe rn,bsn [phone] will give pt effient copay assist card and 30days free card.

## 2012-03-21 NOTE — Progress Notes (Signed)
Right groin dressing soaked with blood, no oozing noted. Dressing changed , pressure dressing applied. ,site soft to touch, no bruising noted.

## 2012-03-21 NOTE — Progress Notes (Addendum)
Patient ID: Alexandra [name], female   DOB: [birth year], 78 y.o.   MRN: [id number]    SUBJECTIVE: No chest pain overnight.  She had chest pain yesterday when she got up to try to go to bathroom.  Hemoglobin fell overnight.  She had diffuse anterior ST depression and ST elevation in AVR at admission, now ECG is normal.      . amitriptyline  10 mg Oral QHS  . aspirin  324 mg Oral Pre-Cath  . aspirin EC  81 mg Oral Daily  . cholecalciferol  1,000 Units Oral Daily  . clopidogrel  75 mg Oral Daily  . ferrous sulfate  1 tablet Oral BID  . furosemide  40 mg Oral Daily  . insulin aspart  0-9 Units Subcutaneous TID WC  . metoprolol  25 mg Oral Q8H  . pantoprazole  40 mg Oral BID  . rosuvastatin  10 mg Oral q1800  . sodium chloride  3 mL Intravenous Q12H  . vitamin B-12  1,000 mcg Oral q morning - 10a  . ascorbic acid  500 mg Oral BID  . DISCONTD: amLODipine  2.5 mg Oral QHS  . DISCONTD: aspirin EC  81 mg Oral Daily  . DISCONTD: irbesartan  150 mg Oral Daily  . DISCONTD: metoprolol  75 mg Oral BID  . DISCONTD: nitroGLYCERIN  5-200 mcg/min Intravenous Once  . DISCONTD: NON FORMULARY 10 mg  10 mg Oral Daily  NTG gtt at 30 Heparin gtt     Filed Vitals:   03/21/12 0300 03/21/12 0400 03/21/12 0500 03/21/12 0700  BP: 91/41 94/37 89/40  97/53  Pulse: 69 63 66 86  Temp:      TempSrc:      Resp: 18 18 18 22   Height:      Weight:   184 lb 1.4 oz (83.5 kg)   SpO2: 96% 98% 98% 96%    Intake/Output Summary (Last 24 hours) at 03/21/12 0755 Last data filed at 03/21/12 0700  Gross per 24 hour  Intake 1911.95 ml  Output   2000 ml  Net -88.05 ml    LABS: Basic Metabolic Panel:  Basename 03/21/12 0031 03/20/12 0254  NA 135 132*  K 3.9 3.8  CL 100 93*  CO2 26 27  GLUCOSE 133* 138*  BUN 27* 28*  CREATININE 1.28* 1.21*  CALCIUM 8.8 9.9  MG -- --  PHOS -- --   Liver Function Tests:  Basename 03/20/12 0254  AST 15  ALT 7  ALKPHOS 80  BILITOT 0.5  PROT 7.9  ALBUMIN 4.2   No  results found for this basename: LIPASE:2,AMYLASE:2 in the last 72 hours CBC:  Basename 03/21/12 0031 03/20/12 0254  WBC 8.0 7.7  NEUTROABS -- 5.6  HGB 8.9* 11.3*  HCT 27.0* 32.7*  MCV 86.5 87.2  PLT 134* 173   Cardiac Enzymes:  Basename 03/20/12 1617 03/20/12 1100 03/20/12 0545  CKTOTAL -- -- --  CKMB -- -- --  CKMBINDEX -- -- --  TROPONINI 1.23* 0.43* <0.30   BNP: No components found with this basename: POCBNP:3 D-Dimer: No results found for this basename: DDIMER:2 in the last 72 hours Hemoglobin A1C: No results found for this basename: HGBA1C in the last 72 hours Fasting Lipid Panel:  Basename 03/21/12 0031  CHOL 116  HDL 46  LDLCALC 51  TRIG 97  CHOLHDL 2.5  LDLDIRECT --   Thyroid Function Tests: No results found for this basename: TSH,T4TOTAL,FREET3,T3FREE,THYROIDAB in the last 72 hours Anemia Panel: No results found  for this basename: VITAMINB12,FOLATE,FERRITIN,TIBC,IRON,RETICCTPCT in the last 72 hours  RADIOLOGY: Dg Chest Portable 1 View  03/20/2012  *RADIOLOGY REPORT*  Clinical Data: Chest pain and shortness of breath.  PORTABLE CHEST - 1 VIEW  Comparison: 11/30/2011  Findings: Cardiac enlargement with pulmonary vascular congestion similar to previous study.  Central pulmonary arteries are prominent which may suggest pulmonary hypertension.  Since the previous study, there is development of interstitial changes suggesting edema.  No focal airspace consolidation.  No blunting of costophrenic angles.  No pneumothorax.  Tortuous aorta with calcification.  IMPRESSION: Cardiac enlargement with pulmonary vascular congestion and developing interstitial edema.   Original Report Authenticated By: Marlon Pel, M.D.     PHYSICAL EXAM General: NAD Neck: No JVD, no thyromegaly or thyroid nodule.  Lungs: Clear to auscultation bilaterally with normal respiratory effort. CV: Nondisplaced PMI.  Heart regular S1/S2, no S3/S4, 3/6 systolic murmur RUSB and apex.  No  peripheral edema.  No carotid bruit.  Abdomen: Soft, nontender, no hepatosplenomegaly, no distention.  Neurologic: Alert and oriented x 3.  Psych: Normal affect. Extremities: No clubbing or cyanosis.   TELEMETRY: Reviewed telemetry pt in NSR  ASSESSMENT AND PLAN:  78 yo with history of CAD presented with NSTEMI.  1. CAD: S/p DES to LAD in 10/12 with restenosis and cutting balloon angioplasty in 7/13.  She is back with NSTEMI and initially had diffuse anterior ST depression and ST elevation in AVR.  Today, ECG is normal.  She had chest pain yesterday when she got up to go to the bathroom.  She is still on NTG gtt. Today, hgb has dropped from 11.3 to 8.9.  P2Y12 test on Plavix is 345 PRU.  This is a difficult situation.  She is pain-free with NTG gtt but has CP with exertion.  She has NSTEMI with ECG changes and known severe disease but picture is complicated by anemia with hemoglobin fall.  Echo with normal EF, mild AS and moderate MR.  - Will check stool guaiac today.  If heme negative, plan LHC today aiming for BMS and use of Effient 5 mg daily.  - If heme positive, will hold off for now on cath.  2. Hypotension: SBP in 90s on NTG gtt and other BP meds.  Hold irbesartan and amlodipine.  3. CKD: Creatinine stable.  If cathed today, limit contrast.  4. Anemia: Hemoglobin dropped with caths in 10/12 and 7/13.  At least once, she was documented heme negative.  There has been no overt GI bleeding but also she has not had a GI workup.  She has been on iron.  Will send Fe studies, B12, folate and guaiac this am as above. Continue Protonix.   [name] 03/21/2012 8:10 AM  I guaiaced stool this morning.  Patient was clearly guaiac negative.  We talked at length about risks/benefits of cath.  She is high risk for either invasive or medical treatment, but given NSTEMI, known significant disease, and guaiac negative, I think that our best course here will be cath.   [name] 03/21/2012 8:24  AM

## 2012-03-21 NOTE — CV Procedure (Addendum)
Cardiac Catheterization Operative Report  Alexandra [name] [id number] 10/21/201311:21 AM [name], MD  Procedure Performed:  1. PTCA with cutting angioplasty of the severe in stent restenosis proximal LAD with Angiosculpt balloon 2. Angioseal femoral artery closure device  Operator: Verne Carrow, MD  Indication:  NSTEMI, known severe CAD with DES proximal LAD in 2012, restenosis treated with cutting balloon angioplasty July 2013. Diagnostic cath this am per Dr. Shirlee Latch with severe restenosis in the proximal LAD stent.                                     Procedure Details: The risks, benefits, complications, treatment options, and expected outcomes were discussed with the patient. The patient and/or family concurred with the proposed plan, giving informed consent for the PCI before the diagnostic procedure. When I entered the case, a 5 French sheath was present in the right femoral artery. The sheath was exchanged for a 6 French sheath in the right femoral artery. She was then given a bolus of Angiomax and a drip was started. After a long discussion with Dr. Shirlee Latch, we elected to load her with Effient 60 mg po x 1 with plans for Effient 5 mg per day starting tomorrow. (Noted that pt is greater than 76 years old but she has been shown to be a Plavix non-responder). I then engaged the left main with a XB LAD 3.5 guiding catheter. When the ACT was greater than 200, I passed a BMW wire down the LAD. I then dilated with restenotic area within the proximal LAD stent with a 3.0 x 10 mm Angiosculpt cutting balloon x 2. There was an excellent result. The stenosis was taken from 99% down to 5%. There was excellent flow into the distal vessel.   There were no immediate complications. The patient was taken to the recovery area in stable condition.   Hemodynamic Findings: Central aortic pressure: 104/44  Impression: 1. Severe restenosis in proximal LAD stent, now s/p successful cutting balloon  angioplasty with an Angiosculpt balloon  Recommendations: Of note, I did not place a new stent as she has a Promus (everolimus coated) stent in place. Placing a stent with the same medication over the old stent would likely not yield any less chance of restenosis. I did not think a bare metal stent would be beneficial on top of a drug eluting stent. Cutting balloon angioplasty is felt to be the best option. She is not a good candidate for CABG given her advanced age. In regards to anti-platelet therapy, she is a Plavix non-responder. After a long discussion with Dr. Shirlee Latch, we elected to load her with Effient 60 mg po x 1 with plans for Effient 5 mg per day starting tomorrow. (Noted that pt is greater than 76 years old but she has been shown to be a Plavix non-responder). It is felt that her best chance of limiting thrombosis will be with Effient but at reduced daily dose to limit her risk of bleeding. Continue ASA 81 mg po Qdaily and Effient 5 mg po Qdaily.   If she has recurrent restenosis in the LAD stent, will consider trying to get a Resolute Integrity DES to be brought in for use as this is a different agent (zotarolimus instead of everolimus). We have none available in our cath lab for use at this time.        Complications:  None; patient tolerated the  procedure well.

## 2012-03-22 ENCOUNTER — Encounter (HOSPITAL_COMMUNITY): Payer: Self-pay | Admitting: General Practice

## 2012-03-22 DIAGNOSIS — D649 Anemia, unspecified: Secondary | ICD-10-CM

## 2012-03-22 DIAGNOSIS — N289 Disorder of kidney and ureter, unspecified: Secondary | ICD-10-CM

## 2012-03-22 LAB — CBC
HCT: 26.2 % — ABNORMAL LOW (ref 36.0–46.0)
Hemoglobin: 8.7 g/dL — ABNORMAL LOW (ref 12.0–15.0)
MCV: 87.9 fL (ref 78.0–100.0)
RBC: 2.98 MIL/uL — ABNORMAL LOW (ref 3.87–5.11)
RDW: 13.7 % (ref 11.5–15.5)
WBC: 7.1 10*3/uL (ref 4.0–10.5)

## 2012-03-22 LAB — GLUCOSE, CAPILLARY
Glucose-Capillary: 121 mg/dL — ABNORMAL HIGH (ref 70–99)
Glucose-Capillary: 133 mg/dL — ABNORMAL HIGH (ref 70–99)

## 2012-03-22 LAB — BASIC METABOLIC PANEL
CO2: 25 mEq/L (ref 19–32)
Calcium: 8.7 mg/dL (ref 8.4–10.5)
Chloride: 104 mEq/L (ref 96–112)
Creatinine, Ser: 1.11 mg/dL — ABNORMAL HIGH (ref 0.50–1.10)
Glucose, Bld: 123 mg/dL — ABNORMAL HIGH (ref 70–99)

## 2012-03-22 MED ORDER — IRBESARTAN 150 MG PO TABS
150.0000 mg | ORAL_TABLET | Freq: Every day | ORAL | Status: DC
  Administered 2012-03-22 – 2012-03-23 (×2): 150 mg via ORAL
  Filled 2012-03-22 (×3): qty 1

## 2012-03-22 MED ORDER — POTASSIUM CHLORIDE CRYS ER 20 MEQ PO TBCR
60.0000 meq | EXTENDED_RELEASE_TABLET | Freq: Every day | ORAL | Status: DC
  Administered 2012-03-22: 60 meq via ORAL
  Filled 2012-03-22 (×2): qty 3

## 2012-03-22 MED ORDER — METOPROLOL TARTRATE 50 MG PO TABS
50.0000 mg | ORAL_TABLET | Freq: Two times a day (BID) | ORAL | Status: DC
  Administered 2012-03-22 (×2): 50 mg via ORAL
  Filled 2012-03-22 (×4): qty 1

## 2012-03-22 MED FILL — Dextrose Inj 5%: INTRAVENOUS | Qty: 50 | Status: AC

## 2012-03-22 NOTE — Progress Notes (Signed)
Patient up with cardiac rehab became nauseous and heart rate went up to the 150's. The patient had to come back to the room. Heart rate and nausea subsided after sat down. Md aware and ok to proceed with the transfer to tele.  Alston Berrie, Charlaine Dalton RN

## 2012-03-22 NOTE — Progress Notes (Signed)
CARDIAC REHAB PHASE I   PRE:  Rate/Rhythm: 82 SR with Pacs    BP: sitting 130/39    SaO2: 98 2L, 97 RA  MODE:  Ambulation: 120 ft   POST:  Rate/Rhythm: 152 irregular    BP: sitting 129/67     SaO2: 95-96 RA  Pt stood with assistance, using gait belt and rocking. Used RW to walk 120 ft, assist x1. Felt strange at 60 ft so turned around. Upon return to room, saw HR 152 irregular. Pt c/o feeling nauseated. Sat on EOB and HR slowly returned to her normal over next 2 min, 80 SR with PACs. Felt better, nausea resolved. Other vitals stable although SaO2 did drop to 88 RA once lying back. Applied O2. Will f/u in am. Reviewed NTG and Effient. [phone] Harriet Masson CES, ACSM

## 2012-03-22 NOTE — Progress Notes (Signed)
SUBJECTIVE: No chest pain or SOB this am. She feels well.   BP 124/44  Pulse 84  Temp 98 F (36.7 C) (Oral)  Resp 19  Ht 5\' 6"  (1.676 m)  Wt 188 lb 11.4 oz (85.6 kg)  BMI 30.46 kg/m2  SpO2 97%  Intake/Output Summary (Last 24 hours) at 03/22/12 0724 Last data filed at 03/22/12 0200  Gross per 24 hour  Intake  630.5 ml  Output   1750 ml  Net -1119.5 ml    PHYSICAL EXAM General: Well developed, well nourished, in no acute distress. Alert and oriented x 3.  Psych:  Good affect, responds appropriately Neck: No JVD. No masses noted.  Lungs: Clear bilaterally with no wheezes or rhonci noted.  Heart: RRR with soft systolic murmur noted. Abdomen: Bowel sounds are present. Soft, non-tender.  Extremities: No lower extremity edema. Right groin soft. No hematoma.   LABS: Basic Metabolic Panel:  Basename 03/22/12 0500 03/21/12 0031  NA 141 135  K 3.4* 3.9  CL 104 100  CO2 25 26  GLUCOSE 123* 133*  BUN 17 27*  CREATININE 1.11* 1.28*  CALCIUM 8.7 8.8  MG -- --  PHOS -- --   CBC:  Basename 03/22/12 0500 03/21/12 0031 03/20/12 0254  WBC 7.1 8.0 --  NEUTROABS -- -- 5.6  HGB 8.7* 8.9* --  HCT 26.2* 27.0* --  MCV 87.9 86.5 --  PLT 126* 134* --   Cardiac Enzymes:  Basename 03/20/12 1617 03/20/12 1100 03/20/12 0545  CKTOTAL -- -- --  CKMB -- -- --  CKMBINDEX -- -- --  TROPONINI 1.23* 0.43* <0.30   Fasting Lipid Panel:  Basename 03/21/12 0031  CHOL 116  HDL 46  LDLCALC 51  TRIG 97  CHOLHDL 2.5  LDLDIRECT --    Current Meds:    . amitriptyline  10 mg Oral QHS  . aspirin EC  81 mg Oral Daily  . bivalirudin      . cholecalciferol  1,000 Units Oral Daily  . fentaNYL      . ferrous sulfate  1 tablet Oral BID  . furosemide  40 mg Oral Daily  . insulin aspart  0-9 Units Subcutaneous TID WC  . metoprolol  25 mg Oral Q8H  . midazolam      . pantoprazole  40 mg Oral BID  . prasugrel  5 mg Oral Daily  . rosuvastatin  10 mg Oral q1800  . sodium chloride  3  mL Intravenous Q12H  . vitamin B-12  1,000 mcg Oral q morning - 10a  . ascorbic acid  500 mg Oral BID  . DISCONTD: amLODipine  2.5 mg Oral QHS  . DISCONTD: clopidogrel  75 mg Oral Daily  . DISCONTD: irbesartan  150 mg Oral Daily  . DISCONTD: metoprolol  75 mg Oral BID  . DISCONTD: sodium chloride  3 mL Intravenous Q12H     ASSESSMENT AND PLAN:  1. CAD: S/p DES to LAD in 10/12 with restenosis and cutting balloon angioplasty in 7/13. She was readmitted with NSTEMI. Cath yesterday with severe restenosis proximal LAD stent. No good options for restenting since she has a Promus DES in place. This has the same drug, everolimus, as our other drug eluting stent. Cutting balloon angioplasty was performed with an Angiosculpt balloon. PRU on Plavix was 345. She was switched to Effient yesterday. This was a complex decision given her advanced age. Plans are to continue Effient at smaller daily dose of 5 mg po Qdaiy.  Echo with normal EF, mild AS and moderate MR.   2. Hypotension: Resolved. Holding irbesartan and amlodipine. Will restart irbesartan today.     3. Chronic kidney disease, stage 2: Stable post cath.   4. Anemia: Stable post cath. Heme negative. There has been no overt GI bleeding but also she has not had a GI workup. She has been on iron.  Continue Protonix.   5. Hypokalemia: Replace this am.   Transfer to telemetry. Hopefully home tomorrow.     Alexandra [name]  10/22/20137:24 AM

## 2012-03-23 DIAGNOSIS — I471 Supraventricular tachycardia: Secondary | ICD-10-CM

## 2012-03-23 LAB — CBC
Hemoglobin: 9.6 g/dL — ABNORMAL LOW (ref 12.0–15.0)
MCH: 30 pg (ref 26.0–34.0)
MCHC: 33.8 g/dL (ref 30.0–36.0)
Platelets: 133 10*3/uL — ABNORMAL LOW (ref 150–400)
RBC: 3.2 MIL/uL — ABNORMAL LOW (ref 3.87–5.11)

## 2012-03-23 LAB — BASIC METABOLIC PANEL
Calcium: 9.3 mg/dL (ref 8.4–10.5)
GFR calc non Af Amer: 43 mL/min — ABNORMAL LOW (ref >90)
Glucose, Bld: 111 mg/dL — ABNORMAL HIGH (ref 70–99)
Potassium: 4.1 mEq/L (ref 3.5–5.1)
Sodium: 142 mEq/L (ref 135–145)

## 2012-03-23 LAB — GLUCOSE, CAPILLARY
Glucose-Capillary: 101 mg/dL — ABNORMAL HIGH (ref 70–99)
Glucose-Capillary: 132 mg/dL — ABNORMAL HIGH (ref 70–99)
Glucose-Capillary: 130 mg/dL — ABNORMAL HIGH (ref 70–99)

## 2012-03-23 MED ORDER — AMLODIPINE BESYLATE 2.5 MG PO TABS
2.5000 mg | ORAL_TABLET | Freq: Every day | ORAL | Status: DC
  Administered 2012-03-23 – 2012-03-24 (×2): 2.5 mg via ORAL
  Filled 2012-03-23 (×2): qty 1

## 2012-03-23 MED ORDER — METOPROLOL TARTRATE 50 MG PO TABS
75.0000 mg | ORAL_TABLET | Freq: Two times a day (BID) | ORAL | Status: DC
  Administered 2012-03-23 – 2012-03-24 (×3): 75 mg via ORAL
  Filled 2012-03-23 (×4): qty 1

## 2012-03-23 NOTE — Progress Notes (Signed)
SUBJECTIVE: No chest pain or SOB. She did have several runs of narrow complex tachycardia, c/w SVT last night. She was unaware of this.   BP 134/66  Pulse 76  Temp 97.8 F (36.6 C) (Oral)  Resp 18  Ht 5\' 6"  (1.676 m)  Wt 185 lb 9.6 oz (84.188 kg)  BMI 29.96 kg/m2  SpO2 94%  Intake/Output Summary (Last 24 hours) at 03/23/12 6213 Last data filed at 03/22/12 1300  Gross per 24 hour  Intake    240 ml  Output    150 ml  Net     90 ml    PHYSICAL EXAM General: Well developed, well nourished, in no acute distress. Alert and oriented x 3.  Psych:  Good affect, responds appropriately Neck: No JVD. No masses noted.  Lungs: Clear bilaterally with no wheezes or rhonci noted.  Heart: RRR with soft systolic murmur noted. Abdomen: Bowel sounds are present. Soft, non-tender.  Extremities: No lower extremity edema. Right groin soft, no hematoma  LABS: Basic Metabolic Panel:  Basename 03/23/12 0510 03/22/12 0500  NA 142 141  K 4.1 3.4*  CL 104 104  CO2 26 25  GLUCOSE 111* 123*  BUN 17 17  CREATININE 1.12* 1.11*  CALCIUM 9.3 8.7  MG -- --  PHOS -- --   CBC:  Basename 03/23/12 0510 03/22/12 0500  WBC 6.7 7.1  NEUTROABS -- --  HGB 9.6* 8.7*  HCT 28.4* 26.2*  MCV 88.8 87.9  PLT 133* 126*   Cardiac Enzymes:  Basename 03/20/12 1617 03/20/12 1100  CKTOTAL -- --  CKMB -- --  CKMBINDEX -- --  TROPONINI 1.23* 0.43*   Fasting Lipid Panel:  Basename 03/21/12 0031  CHOL 116  HDL 46  LDLCALC 51  TRIG 97  CHOLHDL 2.5  LDLDIRECT --    Current Meds:    . amitriptyline  10 mg Oral QHS  . aspirin EC  81 mg Oral Daily  . cholecalciferol  1,000 Units Oral Daily  . ferrous sulfate  1 tablet Oral BID  . furosemide  40 mg Oral Daily  . insulin aspart  0-9 Units Subcutaneous TID WC  . irbesartan  150 mg Oral Daily  . metoprolol  50 mg Oral BID  . pantoprazole  40 mg Oral BID  . potassium chloride  60 mEq Oral Daily  . prasugrel  5 mg Oral Daily  . rosuvastatin  10 mg  Oral q1800  . sodium chloride  3 mL Intravenous Q12H  . vitamin B-12  1,000 mcg Oral q morning - 10a  . ascorbic acid  500 mg Oral BID  . DISCONTD: metoprolol  25 mg Oral Q8H     ASSESSMENT AND PLAN:  1. CAD: S/p DES to LAD in 10/12 with restenosis and cutting balloon angioplasty in 7/13. She was readmitted with NSTEMI. Cath 03/21/12 with severe restenosis proximal LAD stent. No good options for restenting since she has a Promus DES in place. This has the same drug, everolimus, as our other drug eluting stent. Cutting balloon angioplasty was performed with an Angiosculpt balloon. PRU on Plavix was 345. She was switched to Effient on 03/21/12. This was a complex decision given her advanced age. Plans are to continue Effient at smaller daily dose of 5 mg po Qdaiy. Echo with normal EF, mild AS and moderate MR.   2. Hypotension: Resolved. Irbesartan restarted yesterday. Will resume home dose of Lopressor (75 mg po BID) and restart night time dose of Norvasc 2.5  mg po QHS.    3. Chronic kidney disease, stage 2: Stable post cath.   4. Anemia: Stable post cath. Heme negative. There has been no overt GI bleeding but also she has not had a GI workup. She has been on iron. Continue Protonix.   5. Hypokalemia: Resolved.   6. SVT: Narrow complex tachycardia last night, regular and c/w SVT. Will increase beta blocker back to home dose. Monitor x 24 more hours.   7. Dispo: Given advanced age, SVT last night and recent NSTEMI, will ambulate today, monitor x 24 more hours on telemetry.      MCALHANY,CHRISTOPHER  10/23/20137:21 AM

## 2012-03-23 NOTE — Progress Notes (Signed)
CARDIAC REHAB PHASE I   PRE:  Rate/Rhythm: 72 SR    BP: sitting 118/60    SaO2: 93 RA  MODE:  Ambulation: 340 ft   POST:  Rate/Rhythm: 101 ST with PACs    BP: sitting 126/60     SaO2: 85 RA, 93 RA  Pt reluctant to walk. Tolerated well. Longer distance without problems. HR gets irregular walking but stayed controlled. No c/o. To recliner. SaO2 85 initially but then checked another finger and read 93 RA. Denied SOB. Sts she has a pulse ox at home. Will f/u. [id number]  Harriet Masson CES, ACSM

## 2012-03-24 ENCOUNTER — Other Ambulatory Visit: Payer: Self-pay | Admitting: *Deleted

## 2012-03-24 ENCOUNTER — Encounter (HOSPITAL_COMMUNITY): Payer: Self-pay | Admitting: Physician Assistant

## 2012-03-24 DIAGNOSIS — I509 Heart failure, unspecified: Secondary | ICD-10-CM

## 2012-03-24 DIAGNOSIS — I214 Non-ST elevation (NSTEMI) myocardial infarction: Secondary | ICD-10-CM

## 2012-03-24 DIAGNOSIS — I5031 Acute diastolic (congestive) heart failure: Secondary | ICD-10-CM | POA: Diagnosis present

## 2012-03-24 LAB — BASIC METABOLIC PANEL
CO2: 25 mEq/L (ref 19–32)
Calcium: 9.3 mg/dL (ref 8.4–10.5)
Creatinine, Ser: 1.18 mg/dL — ABNORMAL HIGH (ref 0.50–1.10)

## 2012-03-24 LAB — GLUCOSE, CAPILLARY: Glucose-Capillary: 230 mg/dL — ABNORMAL HIGH (ref 70–99)

## 2012-03-24 LAB — CBC
HCT: 27.3 % — ABNORMAL LOW (ref 36.0–46.0)
Hemoglobin: 9.1 g/dL — ABNORMAL LOW (ref 12.0–15.0)
MCH: 29.2 pg (ref 26.0–34.0)
MCHC: 33.3 g/dL (ref 30.0–36.0)
MCV: 87.5 fL (ref 78.0–100.0)
RBC: 3.12 MIL/uL — ABNORMAL LOW (ref 3.87–5.11)

## 2012-03-24 MED ORDER — NITROGLYCERIN 0.4 MG SL SUBL: 0.4000 mg | SUBLINGUAL_TABLET | SUBLINGUAL | Status: DC | PRN

## 2012-03-24 MED ORDER — ROSUVASTATIN CALCIUM 10 MG PO TABS: 10.0000 mg | ORAL_TABLET | Freq: Every day | ORAL | Status: DC

## 2012-03-24 MED ORDER — PANTOPRAZOLE SODIUM 40 MG PO TBEC: 40.0000 mg | DELAYED_RELEASE_TABLET | Freq: Two times a day (BID) | ORAL | Status: DC

## 2012-03-24 MED ORDER — IRBESARTAN 75 MG PO TABS: 75.0000 mg | ORAL_TABLET | Freq: Every day | ORAL | Status: DC

## 2012-03-24 MED ORDER — PRASUGREL HCL 5 MG PO TABS: 5.0000 mg | ORAL_TABLET | Freq: Every day | ORAL | Status: DC

## 2012-03-24 MED ORDER — IRBESARTAN 75 MG PO TABS
75.0000 mg | ORAL_TABLET | Freq: Every day | ORAL | Status: DC
  Administered 2012-03-24: 75 mg via ORAL
  Filled 2012-03-24: qty 1

## 2012-03-24 NOTE — Progress Notes (Signed)
Patient: Alexandra [name] Date of Encounter: 03/24/2012, 6:46 AM Admit date: 03/20/2012     Subjective  Didn't sleep well due to noise, shuffling, generally being in the hospital. No CP or SOB. Unaware of SVT overnight. Mood is excellent, and patient denies all symptoms.   Objective   Telemetry: NSR with PACs, brief SVT last night ~11pm Physical Exam: Filed Vitals:   03/24/12 0500  BP: 105/65  Pulse: 75  Temp: 97.8 F (36.6 C)  Resp: 18   General: Well developed, well nourished [name] in no acute distress.  Head: Normocephalic, atraumatic, sclera non-icteric, no xanthomas, nares are without discharge.  Neck: +R carotid bruit. JVD not elevated.  Lungs: Mild exp wheezes at bases with decreased BS at bases, otherwise no rales or rhonchi. Breathing is unlabored. Mild to moderate kyphosis. Heart: RRR with S1 S2. No Mild SEM RUSB c/w AS, preserved S2. No rubs rubs or gallops appreciated.  Abdomen: Soft, non-tender, non-distended with normoactive bowel sounds. No hepatomegaly. No rebound/guarding. No obvious abdominal masses.  Msk: Strength and tone appear normal for age.  Extremities: No clubbing or cyanosis. No edema. Distal pedal pulses are 1-2+ and equal bilaterally. R groin cath site mild ecchymosis, no oozing/hematoma or bruit. Neuro: Alert and oriented X 3. No focal deficit. No facial asymmetry. Moves all extremities spontaneously.  Psych: Responds to questions appropriately with a normal affect.     Intake/Output Summary (Last 24 hours) at 03/24/12 0646 Last data filed at 03/23/12 2137  Gross per 24 hour  Intake      3 ml  Output      0 ml  Net      3 ml   Total I&O  -1.5 L since admission, but recording is clearly not accurate, at least within the past 24 hours.  Inpatient Medications:    . amitriptyline  10 mg Oral QHS  . amLODipine  2.5 mg Oral Daily  . aspirin EC  81 mg Oral Daily  . cholecalciferol  1,000 Units Oral Daily  . ferrous sulfate  1 tablet  Oral BID  . furosemide  40 mg Oral Daily  . insulin aspart  0-9 Units Subcutaneous TID WC  . irbesartan  150 mg Oral Daily  . metoprolol  75 mg Oral BID  . pantoprazole  40 mg Oral BID  . prasugrel  5 mg Oral Daily  . rosuvastatin  10 mg Oral q1800    Labs:  [facility] 03/24/12 0430 03/23/12 0510  NA 139 142  K 3.9 4.1  CL 100 104  CO2 25 26  GLUCOSE 114* 111*  BUN 18 17  CREATININE 1.18* 1.12*  CALCIUM 9.3 9.3  MG -- --  PHOS -- --    Basename 03/24/12 0430 03/23/12 0510  WBC 7.2 6.7  NEUTROABS -- --  HGB 9.1* 9.6*  HCT 27.3* 28.4*  MCV 87.5 88.8  PLT 149* 133*   Radiology/Studies:  Dg Chest Portable 1 View  03/20/2012  *RADIOLOGY REPORT*  Clinical Data: Chest pain and shortness of breath.  PORTABLE CHEST - 1 VIEW  Comparison: 11/30/2011  Findings: Cardiac enlargement with pulmonary vascular congestion similar to previous study.  Central pulmonary arteries are prominent which may suggest pulmonary hypertension.  Since the previous study, there is development of interstitial changes suggesting edema.  No focal airspace consolidation.  No blunting of costophrenic angles.  No pneumothorax.  Tortuous aorta with calcification.  IMPRESSION: Cardiac enlargement with pulmonary vascular congestion and developing interstitial edema.  Original Report Authenticated By: Marlon Pel, M.D.      Assessment and Plan   1. NSTEMI with hx of known CAD s/p PTCA with cutting angioplasty of the severe in stent restenosis proximal LAD with Angiosculpt balloon 03/21/12 - continue Effient, ASA, Crestor, metoprolol. 2. Plavix nonresponder --> changed to Effient this admission. 30 day free card given by case mgr. 3. Asymptomatic SVT - continue BB. Further per MD. 4. Hypotension - BP still slightly soft. Will cut irbesartan to 75mg  daily. Would prefer to leave BB at current dose given intermittent SVT in case this is desired to be increased. 5. CKD, stage II - relatively stable. 6. Anemia -  Dr. Shirlee Latch guaiaced stools on 10/21 which he documented as clearly negative. Hgb down slightly from admission but this is after IVF/sticks. No overt GI bleeding symptoms. Hgb has leveled out. Will need OP f/u with to ensure stability. Continue iron. 7. Valvular heart disease: moderately thickened/mildly calcified aortic valve, mod MR 03/20/12  Ronie Spies PA-C  Cardiology Attending Patient interviewed and examined. Discussed with Dayna Dunn PA-C.  Above note annotated and modified based upon my findings.  Examination is benign with no evidence for congestive heart failure. Chest x-ray revealed radiographic signs of CHF on admission, which were likely related to patient's non-ST segment myocardial infarction. She appears stable for discharge. An outpatient chest x-ray will be obtained to verify that signs of CHF have resolved. Medical treatment for coronary disease with recent MI and preserved LV function is appropriate.  Anemia remains moderately severe and despite treatment with oral iron. This will be further evaluated when she returns to the office for reassessment in 2 weeks.  [place] Bing, MD 03/24/2012, 11:08 AM  20 minutes spent with patient.

## 2012-03-24 NOTE — Progress Notes (Signed)
CARDIAC REHAB PHASE I   PRE:  Rate/Rhythm: 86 SR PAC  BP:  Supine:   Sitting: 140/50  Standing:    SaO2: 97 RA  MODE:  Ambulation: 340 ft   POST:  Rate/Rhythem: 100  BP:  Supine:   Sitting: 143/64  Standing:    SaO2: 96 RA [id number] Assisted X 1 and used gait belt and rollater to ambulate. Gait steady with walker. VS stable. Pt able to walk 340 feet without c/o of cp or SOB. Pt back to recliner after walk with call light in reach.  Beatrix Fetters

## 2012-03-24 NOTE — Progress Notes (Signed)
Pt provided with dc instructions and education. Pt verbalized understanding. Pt handed prescriptions and 30 day free prescription of effient. Pt teach back how to take new medications and when to take next. Pt has no questions at this time and plans to follow up as on AVS. IV removed with tip intact. Heart monitor cleaned and returend to front. Ramond Craver, RN

## 2012-03-24 NOTE — Discharge Summary (Signed)
Discharge Summary   Patient ID: Alexandra [name] MRN: [id number], DOB/AGE: January 31, 1924 78 y.o. Admit date: 03/20/2012 D/C date:     03/24/2012  Primary Cardiologist: Shariya Gaster  Primary Discharge Diagnoses:  1. CAD - NSTEMI this admission s/p PTCA with cutting balloon angioplasty for ISR of prox LAD 03/21/12   - residual LCx/RCA dz for med rx - Plavix nonresponder - changed to low-dose Effient - prior history before this admission: NSTEMI 03/2011 complicated by pulm edema s/p DES to subtotal LAD; [place] s/p PTCA/cutting balloon to prox LAD for ISR 12/01/11 2. HTN with hypotension this admission 3. SVT - prior hx of such during myoview, noted on tele this admission (asymptomatic) 4. CKD, stage II 5. Anemia  - negative stool guaiac by Dr. Shirlee Latch this admission - d/c Hgb 9.1 6. Valvular heart disease -  moderately thickened/mildly calcified aortic valve, mod MR 03/20/12  7. Premature atrial contractions  Secondary Discharge Diagnoses:  1. Asthmatic bronchitis 2. Sarcoidosis of the lung 1990s treated with prednisone 3. PVD - Carotid doppler 07/2011: mild-mod atherosclerotic plaque bilaterally, 50-69% .  4. Hyperlipidemia 5. Diabetes mellitus 6. Obesity 7. GERD 8. Diverticulosis of colon  9. IBS (irritable bowel syndrome)  10. DJD (degenerative joint disease)  11. Vitamin D deficiency disease   12. Fibromyalgia   13. Anxiety   14. Intermittent vertigo   15. Venous insufficiency    Hospital Course: [name] is an 78 y/o F with hx of CAD (NSTEMI 03/2011 s/p DES to subtotal LAD, PTCA/cutting balloon to prox LAD for ISR 12/01/11 with residual disease), DM, HTN, remote sarcoidosis, and mild renal insufficiency who presented initially to [facility] with complaints of chest pain reminiscent of her prior angina. She has been feeling intermittent substernal chest discomfort radiating to her throat like a "throbbing" down into her arms for the past 4 days. It occurred primarily with walking around but also  began waking her out of sleep so she came to the [facility]. EKG in the setting of CP demonstrated marked ST depression V2-V6 as well as ST/T abnormalities in I, II, avL, avF. She was treated with aspirin, heparin, morphine, and nitroglycerin drip and was transferred to [facility] for further eval.  F/u EKG while pain free demonstrated interval resolution of changes. CXR: cardiac enlargement with pulm vasc congestion and developing interstitial edema, although she did not appear volume overloaded on exam. Vitals were stable and she was not tachycardic, tachypnic or hypoxic. Initial troponins were negative but later returned positive up to 1.23. Cardiac cath was recommended. Zocor was changed to Crestor given her amlodipine use (intolerant to Lipitor). Metformin was held due to renal insufficiency and in prep for cath. She was noted to be anemic with Hgb 8.9, but Dr. Shirlee Latch performed stool guaiac on 03/21/12 which was clearly negative. Cardiac cath demonstrated severe in-stent restenosis in the proximal LAD, up to 95% as well as diffuse severe but not critical disease in the RCA and moderate disease in the LCx. Dr. Clifton James reports: "Of note, I did not place a new stent as she has a Promus (everolimus coated) stent in place. Placing a stent with the same medication over the old stent would likely not yield any less chance of restenosis. I did not think a bare metal stent would be beneficial on top of a drug eluting stent. Cutting balloon angioplasty is felt to be the best option. She is not a good candidate for CABG given her advanced age. If she has recurrent restenosis in  the LAD stent, will consider trying to get a Resolute Integrity DES to be brought in for use as this is a different agent (zotarolimus instead of everolimus) - we have none available in our cath lab for use at this time."  The patient subsequently underwent successful cutting balloon angioplasty with an Angiosculpt balloon. PRU was 345  demonstrating Plavix hyporesponsiveness and she was changed to low-dose Effient. She tolerated the procedure well. 2D echo was obtained with normal LV function, valvular changes as above. She did have some hypotension thus meds were adjusted as below. Hgb remained stable post-cath. She was noted to have intermittent asymptomatic SVT on telemetry. BB was increased back to home dose. Today she is feeling well. Dr. Dietrich Pates has seen and examined her and feels she is stable for discharge. She will have OP CXR to ensure that signs of CHF on initial CXR had cleared. Per Dr. Dietrich Pates, she will f/u in 2 weeks. The [place] staff have been notified that this is a TOC patient.  Her Metformin will remain on hold at discharge given her renal failure. She was instructed to discuss with PCP regarding possible alternative agents.  Discharge Vitals: Blood pressure 140/60, pulse 76, temperature 97.8 F (36.6 C), temperature source Oral, resp. rate 18, height 5\' 6"  (1.676 m), weight 180 lb 12.8 oz (82.01 kg), SpO2 93.00%.  Labs: Lab Results  Component Value Date   WBC 7.2 03/24/2012   HGB 9.1* 03/24/2012   HCT 27.3* 03/24/2012   MCV 87.5 03/24/2012   PLT 149* 03/24/2012     Lab 03/24/12 0430 03/20/12 0254  NA 139 --  K 3.9 --  CL 100 --  CO2 25 --  BUN 18 --  CREATININE 1.18* --  CALCIUM 9.3 --  PROT -- 7.9  BILITOT -- 0.5  ALKPHOS -- 80  ALT -- 7  AST -- 15  GLUCOSE 114* --    Lab Results  Component Value Date   CHOL 116 03/21/2012   HDL 46 03/21/2012   LDLCALC 51 03/21/2012   TRIG 97 03/21/2012    Diagnostic Studies/Procedures   1. Chest Portable 1 View 03/20/2012  *RADIOLOGY REPORT*  Clinical Data: Chest pain and shortness of breath.  PORTABLE CHEST - 1 VIEW  Comparison: 11/30/2011  Findings: Cardiac enlargement with pulmonary vascular congestion similar to previous study.  Central pulmonary arteries are prominent which may suggest pulmonary hypertension.  Since the previous study,  there is development of interstitial changes suggesting edema.  No focal airspace consolidation.  No blunting of costophrenic angles.  No pneumothorax.  Tortuous aorta with calcification.  IMPRESSION: Cardiac enlargement with pulmonary vascular congestion and developing interstitial edema.   Original Report Authenticated By: Marlon Pel, M.D.    2. 2D echo 03/20/12 Study Conclusions - Left ventricle: The cavity size was normal. Wall thickness was increased in a pattern of mild LVH. Systolic function was vigorous. The estimated ejection fraction was in the range of 65% to 70%. - Aortic valve: Trileaflet; moderately thickened, mildly calcified leaflets. Valve area: 1.51cm^2(VTI). Valve area: 1.26cm^2 (Vmax). - Mitral valve: MR is eccentric directed along anterior and posterior wall of LA. Probably moderate in severity. MV is difficult to see because of heavily calcified annulus. There does not appear to be significant stenosis. Calcified annulus. Moderately thickened leaflets .  3. Cardiac catheterization this admission, please see full report and above for summary.   Discharge Medications   Current Discharge Medication List    START taking these medications  Details  prasugrel (EFFIENT) 5 MG TABS Take 1 tablet (5 mg total) by mouth daily. Qty: 30 tablet, Refills: 6    rosuvastatin (CRESTOR) 10 MG tablet Take 1 tablet (10 mg total) by mouth daily. Qty: 30 tablet, Refills: 6      CONTINUE these medications which have CHANGED   Details  irbesartan (AVAPRO) 75 MG tablet Take 1 tablet (75 mg total) by mouth daily. Qty: 30 tablet, Refills: 6    nitroGLYCERIN (NITROSTAT) 0.4 MG SL tablet Place 1 tablet (0.4 mg total) under the tongue every 5 (five) minutes as needed for chest pain (up to 3 doses). Qty: 25 tablet, Refills: 4   Comments: For chest pain      CONTINUE these medications which have NOT CHANGED   Details  acetaminophen (TYLENOL) 500 MG tablet Take 500 mg by mouth every 6  (six) hours as needed.      amitriptyline (ELAVIL) 10 MG tablet Take 1 tablet (10 mg total) by mouth at bedtime. Qty: 30 tablet, Refills: 11    amLODipine (NORVASC) 2.5 MG tablet Take 2.5 mg by mouth at bedtime.    ascorbic acid (VITAMIN C) 500 MG tablet Take 500 mg by mouth 2 (two) times daily. Twice daily with iron tablet    aspirin EC 81 MG tablet Take 81 mg by mouth every morning.     cholecalciferol (VITAMIN D) 1000 UNITS tablet Take 1,000 Units by mouth daily.    econazole nitrate 1 % cream Apply 1 application topically daily as needed. FOR FEET IRRITATION    ferrous sulfate 324 (65 FE) MG TBEC Take 1 tablet by mouth 2 (two) times daily.     furosemide (LASIX) 40 MG tablet Take 40 mg by mouth daily.    glucose blood test strip 1 each by Other route as needed. Use as instructed     hydrOXYzine (ATARAX/VISTARIL) 25 MG tablet Take 25 mg by mouth every 6 (six) hours as needed. For itching or anxiety    metoprolol (LOPRESSOR) 50 MG tablet Take  1 1/2 tablets (75 mg ) twice daily    pantoprazole (PROTONIX) 40 MG tablet Take 1 tablet (40 mg total) by mouth 2 (two) times daily. Qty: 60 tablet, Refills: 6       vitamin B-12 (CYANOCOBALAMIN) 1000 MCG tablet Take 1,000 mcg by mouth every morning.      STOP taking these medications     clopidogrel (PLAVIX) 75 MG tablet Comments:  Reason for Stopping:       metFORMIN (GLUCOPHAGE) 500 MG tablet Comments:  Reason for Stopping:       LORazepam (ATIVAN) 0.5 MG tablet Comments:  Reason for Stopping:       meclizine (ANTIVERT) 25 MG tablet Comments:  Reason for Stopping:       simvastatin (ZOCOR) 40 MG tablet Comments:  Reason for Stopping:          Disposition   The patient will be discharged in stable condition to home. Discharge Orders    Future Appointments: Provider: Department: Dept Phone: Center:   04/06/2012 11:00 AM Vickki Hearing, MD [facility] [phone] [facility]   04/07/2012 11:20 AM Jodelle Gross, NP [facility] [phone] [facility]   04/20/2012 11:30 AM Lorin Picket Elayne Snare, MD [facility] [phone] None     Future Orders Please Complete By Expires   Diet - low sodium heart healthy      Comments:   Diabetic Diet   Increase activity slowly  Comments:   No driving for 1 week. No lifting over 5 lbs for 2 weeks. No sexual activity for 2 weeks. Keep procedure site clean & dry. If you notice increased pain, swelling, bleeding or pus, call/return!  You may shower, but no soaking baths/hot tubs/pools for 1 week.     Follow-up Information    Follow up with [facility]. (On Monday 03/28/12, please go to [facility] (front desk check-in) to get a repeat chest x-ray and labwork (BMET, CBC).)       Follow up with Joni Reining, NP. (04/07/12 at 11:20am)    Contact information:   [address] [place] [address] [phone]       Follow up with NADEL,SCOTT M, MD. (Please contact your primary doctor to discuss alternatives to your diabetes medicine Metformin. This was stopped due to your slight kidney insufficiency.)    Contact information:   Sher[facility] Flow, P.A[facility] [address] [place] [address] [phone]           Duration of Discharge Encounter: Greater than 30 minutes including physician and PA time.  Dayna Dunn PA-C 03/24/2012, 12:29 PM  Cardiology Attending Patient interviewed and examined. Discussed with Dayna Dunn PA-C.  Above note annotated and modified based upon my findings.  Patient is doing very well with respect to coronary artery disease. Initial chest x-ray showed congestive heart failure, but none apparent by examination at the time of discharge. Outpatient chest x-ray will be obtained prior to patient's return to see me in approximately 2 weeks.  [place] Bing, MD 03/24/2012, 3:21 PM

## 2012-03-25 ENCOUNTER — Telehealth: Payer: Self-pay | Admitting: Pulmonary Disease

## 2012-03-25 NOTE — Telephone Encounter (Signed)
Spoke with pt and notified SN does not have an opening for HFU but she can see TP HFU scheduled for 03/30/12  Pt states nothing further needed

## 2012-03-28 ENCOUNTER — Ambulatory Visit (HOSPITAL_COMMUNITY)
Admission: RE | Admit: 2012-03-28 | Discharge: 2012-03-28 | Disposition: A | Discharge status: Home / Self Care | Payer: Medicare Other | Source: Ambulatory Visit | Attending: Physician Assistant | Admitting: Physician Assistant

## 2012-03-28 DIAGNOSIS — I214 Non-ST elevation (NSTEMI) myocardial infarction: Secondary | ICD-10-CM

## 2012-03-29 LAB — BASIC METABOLIC PANEL
CO2: 26 mEq/L (ref 19–32)
Chloride: 102 mEq/L (ref 96–112)
Glucose, Bld: 116 mg/dL — ABNORMAL HIGH (ref 70–99)
Potassium: 4.5 mEq/L (ref 3.5–5.3)
Sodium: 137 mEq/L (ref 135–145)

## 2012-03-29 LAB — CBC
Hemoglobin: 9.9 g/dL — ABNORMAL LOW (ref 12.0–15.0)
MCH: 29 pg (ref 26.0–34.0)
RBC: 3.41 MIL/uL — ABNORMAL LOW (ref 3.87–5.11)

## 2012-03-30 ENCOUNTER — Other Ambulatory Visit (INDEPENDENT_AMBULATORY_CARE_PROVIDER_SITE_OTHER): Payer: Medicare Other

## 2012-03-30 ENCOUNTER — Ambulatory Visit (INDEPENDENT_AMBULATORY_CARE_PROVIDER_SITE_OTHER): Payer: Medicare Other | Admitting: Adult Health

## 2012-03-30 ENCOUNTER — Encounter: Payer: Self-pay | Admitting: Adult Health

## 2012-03-30 VITALS — BP 124/74 | HR 62 | Temp 97.1°F | Ht 66.0 in | Wt 186.0 lb

## 2012-03-30 DIAGNOSIS — E119 Type 2 diabetes mellitus without complications: Secondary | ICD-10-CM

## 2012-03-30 DIAGNOSIS — N289 Disorder of kidney and ureter, unspecified: Secondary | ICD-10-CM

## 2012-03-30 DIAGNOSIS — I5031 Acute diastolic (congestive) heart failure: Secondary | ICD-10-CM

## 2012-03-30 DIAGNOSIS — I509 Heart failure, unspecified: Secondary | ICD-10-CM

## 2012-03-30 DIAGNOSIS — I214 Non-ST elevation (NSTEMI) myocardial infarction: Secondary | ICD-10-CM

## 2012-03-30 LAB — CBC WITH DIFFERENTIAL/PLATELET
Basophils Absolute: 0 10*3/uL (ref 0.0–0.1)
HCT: 30.9 % — ABNORMAL LOW (ref 36.0–46.0)
Lymphs Abs: 1.1 10*3/uL (ref 0.7–4.0)
MCHC: 32.9 g/dL (ref 30.0–36.0)
MCV: 89.4 fl (ref 78.0–100.0)
Monocytes Absolute: 0.4 10*3/uL (ref 0.1–1.0)
Platelets: 236 10*3/uL (ref 150.0–400.0)
RDW: 13.9 % (ref 11.5–14.6)

## 2012-03-30 LAB — BASIC METABOLIC PANEL
Calcium: 9.2 mg/dL (ref 8.4–10.5)
GFR: 41.26 mL/min — ABNORMAL LOW (ref >60.00)
Sodium: 139 mEq/L (ref 135–145)

## 2012-03-30 MED ORDER — GLIMEPIRIDE 1 MG PO TABS: 1.0000 mg | ORAL_TABLET | Freq: Every day | ORAL | Status: DC

## 2012-03-30 NOTE — Assessment & Plan Note (Signed)
Compensated on present regimen cxr 10/28 w/ no acute process

## 2012-03-30 NOTE — Assessment & Plan Note (Signed)
Metformin stopped due to renal insufficiency  Check A1C today along w/ bmet  Could not get labs drawn on 10/28 after several requests made.  Begin Amaryl 1mg  daily w/ meals.  Caution for low sugars. -pt aware

## 2012-03-30 NOTE — Progress Notes (Deleted)
  Subjective:    Patient ID: Alexandra [name], female    DOB: 07-Jul-1923, 78 y.o.   MRN: [id number]  HPI    Review of Systems     Objective:   Physical Exam        Assessment & Plan:

## 2012-03-30 NOTE — Progress Notes (Signed)
Subjective:    Patient ID: Alexandra [name], female    DOB: 19-Jul-1923, 78 y.o.   MRN: [id number]  HPI 78 y/o WF  he has multiple medical problems including AB, hx inactive Sarcoid, HBP, PVD, VI, Hyperlipidemia, DM, Obesity, GERD/ Divertics/ IBS, DJD/ FM/ osteop, Anxiety, Anemia...  ~  March 19, 2010:  4 mo ROV & she notes stable- Hydroxyzine helps "throat" episodes that occur w/ anxiety; no intercurrent infections;  BP stable on meds;  still too sedentary & not effectively dieting (wt w/o change);  she had BMD 6/11= osteoporotic in left FemNeck & Alendronate recommended to her but she flat out refuses to take the Bisphos Rx "I'll take my chances"...  ~  July 22, 2010:  40mo ROV & weight is down 13# on diet;  she's had occas episodes of palpit, heart racing & notes "throat closing" sensation when this occurs, noted w/ activity & valsalva maneuver eg bending down & assoc w/ SOB but denies CP;  EKG w/ atrial arrhythmias/ PACs & we discussed need for further Cards eval- she requests the [facility] office;  she is currently on ASA, Plavix, Metoprolol 25mg Bid, Avapro 150mg /d, Lasix 40mg /d;  BP well controlled on this...    She has hx old burnt-out Sarcoid> yearly CXR today shows mild cardiomeg, tort Ao, chr incr markings, NAD;  denies cough, sput, chr in her baseline SOB/ DOE, etc...    Her FLP looks good on Simva40;  DM control is excellent on Metformin Bid;  weight down to 190# as noted;  GI appears stable & is followed in [place] now;  Ortho per DrSHarrison in [place] w/ shots every 20mo or so!;  hx Anemia & Hg improved to 11.6 today, Fe=51, Sat=16%, B12 is high;  she uses Hydroxyzine (Vistaril) 25mg  Prn for nerves...  ~  November 18, 2010:  40mo ROV & she seems more anxious & sl more agitated than prev, also weaker, more feeble (ie couldn't step up onto the exam table); she is concerned for her cardiac status> DrHochrein did Holter monitor w/ PACs/ PVCs & no dangerous arrhythmias & no definite AFib seen;  CDopplers ordered for ?CBruit but I cannot find the results in EPIC;  He increased her Metoprolol to 50mg Bid, and she increased her ASA to 81mg Bid as well... She notes that her HYDROXYZINE (Vistaril) helps more than anything & I mentioned it was OK to take this regularly;  See prob list below>>  ~  August 25, 2011:  6mo ROV & we reviewed her interval developments & she continues to f/u w/ DrRothbart regularly...    She had nonQ-MI 10/12 & cath showed severe 3 vessel CAD w/ PTCA/ stent placed in LAD (99% occluded) + med rx w/ Plavix added; there was AK anteriorly but LVF was preserved; DrRothbart did a Myoview scan 3/13 w/ SVT developing after the infusion w/ CP & abn STsegment changes; she spont reverted to NSR w/o need for intervention; Myoview images showed scar anteroseptal area + soft tissue attenuation & minor ischemia at the apex & EF=77%;  They incr her Metoprolol to 75mg Bid...    She saw DrHarrison in [place] 1/13 for a left knee injection. LABS 3/13:  FLP- at goals on Simva40 x TG=157;  Chems- ok x BS=126 A1c=6.0 on Metform;  CBC- Hg=11.3;  TSH=4.11;  BNP=309  ~  December 07, 2011:  40mo ROV & Alexandra [name] was Adm 7/1 - 12/04/11 w/ unstable angina (pain involved her neck & throat, then across her chest to arms) and  cath showed another 99% focal proxLAD stenosis within the prev stent & mod RCA & Circ disease (including a 90% mid-distal RCA lesion- felt too difficult to intervene); she had a PTCA/ cutting balloon for the LAD in stent restenosis; continue ASA/ Plavix; continue other meds including Metoprolol, Amlodipine, Avapro, Lasix, Simvastatin...  She was also noted to be ANEMIC w/ Hg in the 8-9 range, stools reported neg & she is on FeSO4;  Renal function is borderline w/ Creat in the 0.9-1.2 range & GFR 40-50 range (her Metformin was held in hosp & review of DM control has been good on MetformBid so we decided to restart at just 500mg  Qam)... Finally she notes that "my nerves are shot" and we will rec Lorazepam  0.5mg - 1/2 to 1 tab Tid as needed...    We reviewed prob list, meds, xrays and labs> see below for updates>> CXR 7/13 showed cardiomegaly & mild vasc congestion... LABS 7/13:  Chems- BS=144 BUN=16 Creat=1.17;  CBC- Hg=8.7  ~  January 20, 2012:  6wk ROV & Alexandra [name] is here for follow up; she also brought several loose papers from the [place] for her license- I told her we needed the whole package to complete but she was quite insistent that she had to bring the separate pages to all her specialists for completion, so- whatever, we completed what she brought to [place] & returned them to her...     Known CAD w/ recent in-stent restenosis intervention> no further angina/ CP & she is stable on ASA/ Plavix- Cards also wants her to continue ProtonixBid...    We had restarted her Metformin500mg /d & repeat labs today showed BS=118...    She had mild renal insuffic w/ Creat~1.2 & GFR 40-50 range and this is stable...    She had anemia- on FeSO4 and her Hg is up 2 points from disch 8.7 ==> 10.7 w/ FE=45 (16%) today...    We gave her Ativan to try for her anxiety "my nerves are shot" & she feels this is helpful... We reviewed prob list, meds, xrays and labs> see below for updates >> LABS 8/13:  Chems- ok w/ BS=118 BUN=23 Creat=1.2 GFR=44;  CBC- improved w/ Hg=10.7. Fe=45 (sat16%)  03/30/2012 Post Hospital  Patient presents for a post hospital followup. She was admitted October 20 through October 24 for a non-ST elevated MI, status post PTCA with angioplasty She was changed over from Plavix to Effient  Cardiac catheter revealed a severe in-stent restenosis in the proximal LAD up to 95%, as well as diffuse, severe, but not critical disease in the RCA and moderate disease in the left circumflex She was considered not a candidate for CABG She therefore underwent successful cutting balloon angioplasty Chest x-ray did show some congestive heart failure changes on admission Chest x-ray was repeated on October 28 showing no  acute process or edema Her metformin was stopped due to renal insufficiency and has been recommended to no longer use. Scr max 1.43 (baseline ~1.1 )  Home sugars ~100-160  Since discharge improved w/ no chest pain.  No edema or fever .      Problem List:  Hx of ASTHMATIC BRONCHITIS, ACUTE (ICD-466.0) - uses Mucinex as needed... she has chr stable DOE w/o change... she say she walks "some" but is obviously too sedentary due to her arthritis... she has PNEUMOVAX several yrs ago.  Hx of SARCOIDOSIS (ICD-135) - initially diagnosed in 1992 w/ CXR showing diffuse ILDis, +Gallium scan, ACE=61, & Bronch w/ bx showing non-caseating granulomas.Marland KitchenMarland Kitchen  treated w/ Pred & weaned off w/ no active disease since then... last CTChest 7/04 w/ ca++ Ao & coronaries, +mediastinal fat, centilobular emphysema, some scarring... stable without new symptoms of cough, sputum, CP or dyspnea... ~  CXR 1/09 showed stable chr lung changes & prom right hilum... ~  CXR 2/11 showed diffuse ILDz, no adenopathy, NAD.[name]. ~  CXR 2/12 showed borderline cardiomeg, tort Ao, sl incr markings as before, NAD.[name]. ~  CXR 10/12 in hosp showed cardiomeg, calcif in Ao, vasc congestion w/ pulm edema & sm effusions... ~  CXR 7/13 in hosp showed cardiomegaly & mild vasc congestion...  HYPERTENSION (ICD-401.9) - controlled on meds: METOPROLOL 50mg - 1.5 tabsBid, AMLODIPINE 2.5mg /d, AVAPRO 150mg /d, LASIX 40mg /d... ~  Myoview 12/05 was neg- no ischemia, no infarct... ~  EKG 2/11 showed NSR, rate 82/min, WNL.[name]. ~  4/12:  DrHochrein increased her Metoprolol from 25Bid to 50Bid for her BP & ectopy... ~  6/12:  BP= 136/74 & denies HA, visual changes, CP, palipit, syncope, change in dyspnea, edema, etc... ~  3/13:  BP= 124/70 & she is feeling well, denies CP/ SOB/ ch in edema/ etc... ~  7/13:  BP= 124/60 & these meds were not changed during her recent hosp for angina w/ cath & PCI for LAD in-stent restenosis... ~  8/13:  BP= 130/60 & she denies CP,  palpit, ch in SOB or edema...  PALPITATIONS & Ventric Ectopy>  Eval & Rx by DrHochrein, his notes are reviewed... On METOPROLOL 75mg Bid & she uses HYDROXYZINE (Vistaril) 25mg  Prn which she says is helpful... ~  Event Monitor done 4/12 and no definite AFib encountered, just PVCs...  ~  EKG shows just some PACs, otherw neg...  CORONARY ARTERY DISEASE >> on ASA 81mg /d & PLAVIX 75mg /d; plus the meds above & followed by DrRothbart... ~  She had nonQ-MI 10/12 & cath showed severe 3 vessel CAD w/ PTCA/ stent placed in LAD (99% occluded) + med rx w/ Plavix added; there was AK anteriorly but LVF was preserved... ~  EKG 11/12 showed NSR, rate85, PACs, septal infarct, otherw neg... ~  DrRothbart did a Myoview scan 3/13 w/ SVT developing after the infusion w/ CP & abn STsegment changes; she spont reverted to NSR w/o need for intervention; Myoview images showed scar anteroseptal area + soft tissue attenuation & minor ischemia at the apex & EF=77%; They incr her Metoprolol to 75mg Bid... ~  7/13:  She was [facility] for unstable angina w/ cath showing 99% prox LAD in-stent restenosis- PCI w/ cutting ballon & improved; she had mod dis in RCA & Circ (including worsening 90% distal RCA stenosis- but intervention was r/o)... EKG showed NSR, PACs, rate66, otherw wnl, NAD...  PERIPHERAL VASCULAR DISEASE (ICD-443.9) - on ASA 81mg /d, PLAVIX 75mg /d... doing well w/o claud symptoms... ~  Art Dopplers of LE's 6/06 showed biphasic waveforms in ant tib & peroneal arts, normal ABI's... ~  CDopplers 5/12 showed mild heterogeneous plaque bilat w/ 40-59% bilat ICA stenoses... ~  CDopplers 4/13 showed mild to mod plaque bilat, 50-69% bilat ICA stenoses (lower end but R>L)...  VENOUS INSUFFICIENCY (ICD-459.81) - on low sodium diet and the Lasix... she knows to elevate legs, wear support hose, etc... ~  7/13:  She has persistent bilat LE edema & weight is similar 192# on LASIX 40mg /d   HYPERLIPIDEMIA (ICD-272.4) - on SIMVASTATIN  40mg /d... ~  FLP 1/08 showed TChol 121, TG 120, HDL 46, LDL 51... looks great- continue Simva40. ~  FLP 1/09 showed TChol 137, TG 165,  HDL 37, LDL 67 ~  FLP 9/09 showed TChol 133, TG 141, HDL 47, LDL 57 ~  FLP 5/10 showed TChol 131, TG 190, HDL 46, LDL 47 ~  FLP 2/11 showed TChol 129, TG 129, HDL 52, LDL 51 ~  FLP 2/12 showed TChol 134, TG 159, HDL 49, LDL 53 ~  FLP 3/13 on Simva40 showed TChol 112, TG 157, HDL 46, LDL 35... Continue same + better low fat diet.  DM (ICD-250.00) - on diet + METFORMIN 500mg - 1Bid... unable to exercise or lose wt...  ~  labs 1/08 showed BS=130 and HgA1c=6.1 ~  labs 1/09 showed BS= 137, HgA1c= 6.2... diet and exercise are the keys!!! ~  labs 9/09 showed BS= 119, HgA1c= 6.3 ~  labs 5/10 showed BS= 134, A1c= 6.3 ~  labs 2/11 showed BS= 122, A1c= 6.3 ~  labs 6/11 showed BS= 106 ~  Labs 2/12 showed BS= 138, A1c= 6.3 ~  Labs 3/13 on Metform500Bid showed BS= 126, A1c= 6.0.Marland KitchenMarland Kitchen Continue same, get wt down! ~  7/13:  Metformin was held 7/13 Hosp w/ Creat= 0.9-1.2 & GFR= 40-50 range; we decided to restart METFORMIN 500mg  Qam only... ~  Labs 8/13 on Metform500 showed BS= 118  MORBID OBESITY (ICD-278.01) - prev wt 227#  & 5\' 2"  tall for a BMI=41-42...  ~  weight 1/10 = 217# ~  weight 5/10 = 215# ~  weight 9/10 = 210# ~  weight 2/11 = 210# ~  weight 6/11 = 203# ~  weight 10/11 = 203# ~  Weight 6/12 = 195# ~  Weight 3/13 = 192# ~  Weight 7/13 = 192# ~  Weight 8/13 = 188#  GERD (ICD-530.81) - pt was placed on PROTONIX 40mg Bid 7/13 due to reflux symptoms and Anemia... ~  She is encouraged to f/u w/ GI & she indicates that she will call DrRehman when she is ready...  DIVERTICULOSIS, COLON (ICD-562.10) - had colonoscopy by DrDBrodie 1997 that was neg x divertics... now followed in [place]...  IRRITABLE BOWEL SYNDROME (ICD-564.1)  RENAL INSUFFICIENCY >> Creat ~1.2 and GFR ~ 40-50 range...  DEGENERATIVE JOINT DISEASE, ADVANCED (ICD-715.90) - she has mod  severe bilat knee pain and sees DrHarrison, Ortho for shots "every three months"... she is opposed to surgery (see his EMR notes- reviewed)...  VITAMIN D DEFICIENCY (ICD-268.9) ~  labs 5/10 showed Vit D level =13... therefore rec Vit D 50000 u weekly. ~  labs 2/11 showed Vit D level = 32... OK to change to VitD 1000 u daily.. ~  BMD done 6/11 showed TScores -1.3 in Spine, and -2.5 in left FemNeck... rec> start Alendronate 70mg /wk but she refuses Bisphos Rx "I'll take my chances"...  FIBROMYALGIA (ICD-729.1)  ANXIETY (ICD-300.00) - we prev perscribed HYDROXYZINE PAMOATE 25mg  Prn for "throat closing" symptoms... ~  7/13:  She admits that "my nerves are shot" after recent [facility] & Rx LORAZEPAM 0.5mg - 1/2 to 1 tab Tid as needed...  ANEMIA (ICD-285.9) - hx mild anemia w/ Hg = 10.8 Jan09 & started Fe daily at that time...  ~  labs 5/09 showed Hg= 11.7, Fe= 64 ~  labs 9/09 showed Hg= 11.4, Fe= 43... rec- take FeSO4 w/ Vit C 500mg /d. ~  labs 5/10 showed Hg= 11.3,  Fe= 50 ~  labs 2/11 showed Hg= 10.7, Fe= 36 (12%)... rec> incr Fe Vit C to Bid. ~  labs 6/11 showed Hg= 10.9, Fe= 58 (19%sat)... continue Bid Fe ~  Labs 2/12 showed Hg= 11.6, Fe= 51 (16%sat)... Continue  same. ~  Labs 3/13 showed Hg= 11.3.Marland KitchenMarland Kitchen Continue FeSO4 Bid... ~  Labs during 7/13 Hosp showed Hg= 8-9 range... rec to continue FeSO4 Bid... ~  Labs 8/13 showed Hg= 10.7 and Fe= 45 (16%)... Ok to decr Fe to one daily...  INTERMITTENT VERTIGO (ICD-780.4) - prev eval DrWeymann w/ Meclizine Prn...   Past Surgical History  Procedure Date  . Cholecystectomy   . Tonsillectomy   . Hernia repair   . Cataract extraction     Outpatient Encounter Prescriptions as of 01/20/2012  Medication Sig Dispense Refill  . acetaminophen (TYLENOL) 500 MG tablet Take 500 mg by mouth every 6 (six) hours as needed.        [name] amitriptyline (ELAVIL) 10 MG tablet Take 1 tablet (10 mg total) by mouth at bedtime.  30 tablet  11  . amLODipine (NORVASC) 2.5 MG tablet  Take 2.5 mg by mouth at bedtime.      [name] ascorbic acid (VITAMIN C) 500 MG tablet Take 500 mg by mouth 2 (two) times daily. Twice daily with iron tablet      . aspirin EC 81 MG tablet Take 81 mg by mouth every morning.       . cholecalciferol (VITAMIN D) 1000 UNITS tablet Take 1,000 Units by mouth daily.      . clopidogrel (PLAVIX) 75 MG tablet Take 75 mg by mouth every morning.      [name] econazole nitrate 1 % cream Apply 1 application topically daily as needed. FOR FEET IRRITATION      . ferrous sulfate 324 (65 FE) MG TBEC Take 1 tablet by mouth 2 (two) times daily.       . furosemide (LASIX) 40 MG tablet Take 40 mg by mouth daily.      [name] glucose blood test strip 1 each by Other route as needed. Use as instructed       . hydrOXYzine (ATARAX/VISTARIL) 25 MG tablet Take 25 mg by mouth every 6 (six) hours as needed. For itching or anxiety      . irbesartan (AVAPRO) 150 MG tablet Take 150 mg by mouth daily.      [name] LORazepam (ATIVAN) 0.5 MG tablet Take 1/2 to 1 tablet by mouth three times daily as needed for nerves  90 tablet  5  . meclizine (ANTIVERT) 25 MG tablet Take 25 mg by mouth 3 (three) times daily as needed.      . metFORMIN (GLUCOPHAGE) 500 MG tablet Take 500 mg by mouth daily with breakfast.      . metoprolol (LOPRESSOR) 50 MG tablet Take  1 1/2 tablets (75 mg ) twice daily      . nitroGLYCERIN (NITROSTAT) 0.4 MG SL tablet Place 1 tablet (0.4 mg total) under the tongue every 5 (five) minutes x 3 doses as needed.      . pantoprazole (PROTONIX) 40 MG tablet Take 1 tablet (40 mg total) by mouth 2 (two) times daily.  60 tablet  6  . simvastatin (ZOCOR) 40 MG tablet Take 40 mg by mouth at bedtime.      . vitamin B-12 (CYANOCOBALAMIN) 1000 MCG tablet Take 1,000 mcg by mouth every morning.        Allergies  Allergen Reactions  . Codeine Other (See Comments)    Reaction: severe GI pain  . Diazepam Other (See Comments)    REACTION: causes pt unable to wake up after taking  . Lipitor (Atorvastatin  Calcium) Other (See Comments)    Reaction: causes vertigo  .  Penicillins Other (See Comments)    Reaction: unknown (childhood )  . Sulfonamide Derivatives Other (See Comments)    Reaction: unknown (childhood)    Current Medications, Allergies, Past Medical History, Past Surgical History, Family History, and Social History were reviewed in [employer] record.   Review of Systems        Constitutional:   No  weight loss, night sweats,  Fevers, chills, + fatigue, or  lassitude.  HEENT:   No headaches,  Difficulty swallowing,  Tooth/dental problems, or  Sore throat,                No sneezing, itching, ear ache, nasal congestion, post nasal drip,   CV:  No chest pain,  Orthopnea, PND, anasarca, dizziness, palpitations, syncope.   GI  No heartburn, indigestion, abdominal pain, nausea, vomiting, diarrhea, change in bowel habits, loss of appetite, bloody stools.   Resp:  .  No excess mucus, no productive cough,  No non-productive cough,  No coughing up of blood.  No change in color of mucus.  No wheezing.  No chest wall deformity  Skin: no rash or lesions.  GU: no dysuria, change in color of urine, no urgency or frequency.  No flank pain, no hematuria   MS:  No joint pain or swelling.  No decreased range of motion.  No back pain.  Psych:  No change in mood or affect. No depression or anxiety.  No memory loss.       Objective:   Physical Exam      WD, Obese, 78 y/o WF in NAD... GENERAL:  Alert & oriented; pleasant & cooperative... she is so nice  HEENT:  [place]/AT, Glasses, NOSE-clear, THROAT-clear & wnl. NECK:  Supple w/ fairROM; no JVD; no carotid bruits; no thyromegaly or nodules palpated; no lymphadenopathy. CHEST:  decr BS bilat but clear w/o wheezing, rales or signs of consolidation... HEART:   Reg , 1/6 SM  ABDOMEN:  Obese w/ panniculus, soft & nontender; normal bowel sounds; no organomegaly or masses palpated. EXT:  mod arthritic changes; no varicose  veins/ +venous insuffic/ tr+edema bilat NEURO:   no focal neuro deficits... DERM:  few ecchymoses, no rash etc...     Assessment & Plan:

## 2012-03-30 NOTE — Assessment & Plan Note (Signed)
Cont follow up with cards s/p angioplasty

## 2012-03-30 NOTE — Patient Instructions (Addendum)
Low sweet diet  Begin Amaryl 1mg  daily w/ food.  Remain off Metformin  I will call with labs  Call if Blood sugar <90 .  Follow up Dr. Kriste Basque  3 weeks as planned

## 2012-04-01 ENCOUNTER — Encounter: Payer: Self-pay | Admitting: Cardiology

## 2012-04-01 DIAGNOSIS — I1310 Hypertensive heart and chronic kidney disease without heart failure, with stage 1 through stage 4 chronic kidney disease, or unspecified chronic kidney disease: Secondary | ICD-10-CM | POA: Insufficient documentation

## 2012-04-06 ENCOUNTER — Ambulatory Visit (INDEPENDENT_AMBULATORY_CARE_PROVIDER_SITE_OTHER): Payer: Medicare Other | Admitting: Orthopedic Surgery

## 2012-04-06 ENCOUNTER — Encounter: Payer: Self-pay | Admitting: Orthopedic Surgery

## 2012-04-06 ENCOUNTER — Ambulatory Visit: Payer: Medicare Other | Admitting: Orthopedic Surgery

## 2012-04-06 VITALS — Ht 66.0 in | Wt 184.0 lb

## 2012-04-06 DIAGNOSIS — M1710 Unilateral primary osteoarthritis, unspecified knee: Secondary | ICD-10-CM

## 2012-04-06 NOTE — Progress Notes (Signed)
Quick Note:  Called spoke with patient, advised of lab results / recs as stated by TP. Pt verbalized her understanding and denied any questions. ______ 

## 2012-04-06 NOTE — Patient Instructions (Signed)
You have received a steroid shot. 15% of patients experience increased pain at the injection site with in the next 24 hours. This is best treated with ice and tylenol extra strength 2 tabs every 8 hours. If you are still having pain please call the office.    

## 2012-04-06 NOTE — Progress Notes (Signed)
Patient ID: Alexandra [name], female   DOB: [birth year], 78 y.o.   MRN: [id number] No chief complaint on file.   Injections requested   Knee  Injection Procedure Note  Pre-operative Diagnosis: left knee oa  Post-operative Diagnosis: same  Indications: pain  Anesthesia: ethyl chloride   Procedure Details   Verbal consent was obtained for the procedure. Time out was completed.The joint was prepped with alcohol, followed by  Ethyl chloride spray and A 20 gauge needle was inserted into the knee via lateral approach; 4ml 1% lidocaine and 1 ml of depomedrol  was then injected into the joint . The needle was removed and the area cleansed and dressed.  Complications:  None; patient tolerated the procedure well.  Knee  Injection Procedure Note  Pre-operative Diagnosis: right knee oa  Post-operative Diagnosis: same  Indications: pain  Anesthesia: ethyl chloride   Procedure Details   Verbal consent was obtained for the procedure. Time out was completed.The joint was prepped with alcohol, followed by  Ethyl chloride spray and A 20 gauge needle was inserted into the knee via lateral approach; 4ml 1% lidocaine and 1 ml of depomedrol  was then injected into the joint . The needle was removed and the area cleansed and dressed.  Complications:  None; patient tolerated the procedure well.

## 2012-04-07 ENCOUNTER — Ambulatory Visit: Payer: Medicare Other | Admitting: Adult Health

## 2012-04-11 ENCOUNTER — Encounter: Payer: Self-pay | Admitting: Adult Health

## 2012-04-11 ENCOUNTER — Ambulatory Visit: Payer: Medicare Other | Admitting: Adult Health

## 2012-04-11 ENCOUNTER — Ambulatory Visit (INDEPENDENT_AMBULATORY_CARE_PROVIDER_SITE_OTHER): Payer: Medicare Other | Admitting: Adult Health

## 2012-04-11 VITALS — BP 119/54 | HR 54 | Ht 66.0 in | Wt 185.1 lb

## 2012-04-11 DIAGNOSIS — I2510 Atherosclerotic heart disease of native coronary artery without angina pectoris: Secondary | ICD-10-CM

## 2012-04-11 DIAGNOSIS — I10 Essential (primary) hypertension: Secondary | ICD-10-CM

## 2012-04-11 DIAGNOSIS — I7090 Unspecified atherosclerosis: Secondary | ICD-10-CM

## 2012-04-11 DIAGNOSIS — E785 Hyperlipidemia, unspecified: Secondary | ICD-10-CM

## 2012-04-11 NOTE — Assessment & Plan Note (Signed)
Excellent control of blood pressure. We will continue her on her current medications of Avapro 75 mg daily and Lopressor 50 mg twice a day, along with amlodipine 2.5 mg daily.

## 2012-04-11 NOTE — Progress Notes (Signed)
HPI Alexandra [name] is a friendly 78 year old patient of Dr. Shen[place]ah Bing we are following for ongoing assessment and treatment of CAD, with recent admission to [facility] hospital for non-ST elevation MI in the setting of a in-stent restenosis of the proximal LAD. The patient had a non-STEMI in 2012 with a subtotal occlusion of the LAD, and also unstable angina with PTCA and cutting balloon to the proximal LAD for in-stent restenosis on 12/01/2011. The patient came to the hospital with a throbbing pain in her neck and chest and was found to have significant in-stent restenosis of the LAD. A new stent was not placed over the existing promise stent due to in-stent restenosis. Per Dr. Clifton James, "placing a stent with the same medication over a cold stent would likely not yield any less chance of restenosis." And therefore felt cutting balloon angioplasty was the best option. 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 He did state that if she had recurrent stenosis of the LAD stent he would consider trying to get a resolute integrity DES brought to the cath lab as it has a different agent (zotarolimus instead of everolimus). Also Zocor was discontinued as she had was on Norvasc, and changed to Crestor 10 mg. She also was placed on Effient 5 mg daily instead of Plavix.    She is without complaints today. She continues very talkative and friendly. She states that she went shopping over the weekend and was very fatigued thereafter, but felt she may have done too much. She did have some mild throbbing in her throat but she did not require nitroglycerin. She has some complaints of cost medication due to above changes changes but is willing to continue medical compliance.  Allergies  Allergen Reactions  . Codeine Other (See Comments)    Reaction: severe GI pain  . Diazepam Other (See Comments)    REACTION: causes pt unable to wake up after taking  . Lipitor (Atorvastatin Calcium) Other (See  Comments)    Reaction: causes vertigo  . Penicillins Other (See Comments)    Reaction: unknown (childhood )  . Sulfonamide Derivatives Other (See Comments)    Reaction: unknown (childhood)  . Mango Flavor Rash    Current Outpatient Prescriptions  Medication Sig Dispense Refill  . acetaminophen (TYLENOL) 500 MG tablet Take 500 mg by mouth every 6 (six) hours as needed.        [name] amitriptyline (ELAVIL) 10 MG tablet Take 1 tablet (10 mg total) by mouth at bedtime.  30 tablet  11  . amLODipine (NORVASC) 2.5 MG tablet Take 2.5 mg by mouth at bedtime.      [name] ascorbic acid (VITAMIN C) 500 MG tablet Take 500 mg by mouth 2 (two) times daily. Twice daily with iron tablet      . aspirin EC 81 MG tablet Take 81 mg by mouth every morning.       . cholecalciferol (VITAMIN D) 1000 UNITS tablet Take 1,000 Units by mouth daily.      [name] econazole nitrate 1 % cream Apply 1 application topically daily as needed. FOR FEET IRRITATION      . ferrous sulfate 324 (65 FE) MG TBEC Take 1 tablet by mouth 2 (two) times daily.       . furosemide (LASIX) 40 MG tablet Take 40 mg by mouth daily.      [name] glimepiride (AMARYL) 1 MG tablet Take 1 tablet (1 mg total) by mouth daily with  lunch.  30 tablet  5  . glucose blood test strip 1 each by Other route as needed. Use as instructed       . hydrOXYzine (ATARAX/VISTARIL) 25 MG tablet Take 25 mg by mouth every 6 (six) hours as needed. For itching or anxiety      . irbesartan (AVAPRO) 75 MG tablet Take 1 tablet (75 mg total) by mouth daily.  30 tablet  6  . metoprolol (LOPRESSOR) 50 MG tablet Take  1 1/2 tablets (75 mg ) twice daily      . nitroGLYCERIN (NITROSTAT) 0.4 MG SL tablet Place 1 tablet (0.4 mg total) under the tongue every 5 (five) minutes as needed for chest pain (up to 3 doses).  25 tablet  4  . pantoprazole (PROTONIX) 40 MG tablet Take 1 tablet (40 mg total) by mouth 2 (two) times daily.  60 tablet  6  . prasugrel (EFFIENT) 5 MG TABS Take 1 tablet (5 mg total) by  mouth daily.  30 tablet  6  . rosuvastatin (CRESTOR) 10 MG tablet Take 1 tablet (10 mg total) by mouth daily.  30 tablet  6  . vitamin B-12 (CYANOCOBALAMIN) 1000 MCG tablet Take 1,000 mcg by mouth every morning.        Past Medical History  Diagnosis Date  . Asthmatic bronchitis   . Sarcoidosis     a. 1990s of lung tx with prednisone temporarily.  [name] HTN (hypertension)   . PVD (peripheral vascular disease)     a. Carotid dopp 07/2011: mild-mod atherosclerotic plaque bilaterally, 50-69% .  [name] Hyperlipemia   . DM (diabetes mellitus)   . Obesity   . GERD (gastroesophageal reflux disease)   . Diverticulosis of colon   . IBS (irritable bowel syndrome)   . DJD (degenerative joint disease)     Hip and knee pain; hip bursitis; patellar subluxation  . Vitamin D deficiency disease   . Fibromyalgia   . Anxiety     a. Guaiac neg 03/2012.  . Intermittent vertigo   . Anemia   . Venous insufficiency   . PSVT (paroxysmal supraventricular tachycardia)     a. Prior hx of palpitations - developed SVT during stress Myoview 09/2011. b. Noted on tele 03/2012.  . Valvular heart disease     a. moderately thickened/mildly calcified aortic valve, mod MR 03/20/12  . Chronic kidney disease     Stage II  . PAC (premature atrial contraction)   . CAD (coronary artery disease) 03/2011    a. NSTEMI 03/2011 complicated by pulm edema s/p DES to subtotal LAD. b. [place] s/p PTCA/cutting balloon to prox LAD for ISR 12/01/11 (med rx for RCA/Cx dz).  c. NSTEMI 03/2012 s/p cutting angioplasty of ISR of prox LAD 03/21/12 (PLAVIX NONRESPONDER).    Past Surgical History  Procedure Date  . Cholecystectomy   . Tonsillectomy   . Hernia repair   . Cataract extraction   . Coronary angioplasty with stent placement 03/21/2012    ROS: PHYSICAL EXAM BP 119/54  Pulse 54  Ht 5\' 6"  (1.676 m)  Wt 185 lb 1.3 oz (83.952 kg)  BMI 29.87 kg/m2  SpO2 97%  General: Well developed, well nourished, in no acute distress Head: Eyes  PERRLA, No xanthomas.   Normal cephalic and atramatic  Lungs: Clear bilaterally to auscultation and percussion. Heart: HRRR S1 S2, 1/6 systolic murmur .  Pulses are 2+ & equal.            No carotid bruit. No  JVD.  No abdominal bruits. No femoral bruits. Abdomen: Bowel sounds are positive, abdomen soft and non-tender without masses or                  Hernia's noted. Msk:  Back kyphosis, normal gait. Normal strength and tone for age. Extremities: No clubbing, cyanosis or edema.  DP +1, mild ecchymosis of the right groin, without bleeding or swelling. Neuro: Alert and oriented X 3. Psych:  Good affect, responds appropriately  EKG:  ASSESSMENT AND PLAN

## 2012-04-11 NOTE — Assessment & Plan Note (Signed)
She has been changed from Zocor to Crestor as she is also on Norvasc. She is tolerating this well without myalgias. She will have followup lipids and LFTs in 3 months. While hospitalized cholesterol was 116 with HDL of 46 and LDL of 51 with triglycerides of 97.

## 2012-04-11 NOTE — Patient Instructions (Addendum)
Your physician recommends that you schedule a follow-up appointment in: 3 MONTHS WITH DR Dietrich Pates

## 2012-04-11 NOTE — Assessment & Plan Note (Signed)
She has had in-stent restenosis of a previously placed stent to the proximal LAD within 3 months of placement. She has been changed from clopidogrel to prasugrel and had recent PTCA and cutting balloon angioplasty. Schedules had to place a different stent should she have more problems with in stent restenosis using a resolute integrity DES. At this time she will be treated with medication changes only. We will see her sooner than every 6 months initially as she has had a recurrence of in-stent restenosis within a very short period of time. I want to keep up with her symptoms which she describes as throbbing pain into her neck. She is tolerant of that prasugrel without complaints of overt bleeding or tarry stools. She has had nitroglycerin available but has not needed to take it. I have reinforced the need for her to not overdo, and if she becomes more tired or feels as throbbing pain in her throat to call us. With her history, she should not waste time ignoring it or treating it herself. And has been advised to come to ER for any recurrent symptoms and possible need for repeat cardiac catheterization and placement of new stent. She verbalizes understanding.

## 2012-04-19 ENCOUNTER — Encounter: Payer: Self-pay | Admitting: *Deleted

## 2012-04-20 ENCOUNTER — Ambulatory Visit (INDEPENDENT_AMBULATORY_CARE_PROVIDER_SITE_OTHER): Payer: Medicare Other | Admitting: Pulmonary Disease

## 2012-04-20 ENCOUNTER — Encounter: Payer: Self-pay | Admitting: Pulmonary Disease

## 2012-04-20 VITALS — BP 124/56 | HR 60 | Temp 97.4°F | Ht 66.0 in | Wt 187.4 lb

## 2012-04-20 DIAGNOSIS — F419 Anxiety disorder, unspecified: Secondary | ICD-10-CM

## 2012-04-20 DIAGNOSIS — D649 Anemia, unspecified: Secondary | ICD-10-CM

## 2012-04-20 DIAGNOSIS — I739 Peripheral vascular disease, unspecified: Secondary | ICD-10-CM

## 2012-04-20 DIAGNOSIS — E559 Vitamin D deficiency, unspecified: Secondary | ICD-10-CM

## 2012-04-20 DIAGNOSIS — M1990 Unspecified osteoarthritis, unspecified site: Secondary | ICD-10-CM

## 2012-04-20 DIAGNOSIS — I872 Venous insufficiency (chronic) (peripheral): Secondary | ICD-10-CM

## 2012-04-20 DIAGNOSIS — K589 Irritable bowel syndrome without diarrhea: Secondary | ICD-10-CM

## 2012-04-20 DIAGNOSIS — I7090 Unspecified atherosclerosis: Secondary | ICD-10-CM

## 2012-04-20 DIAGNOSIS — E119 Type 2 diabetes mellitus without complications: Secondary | ICD-10-CM

## 2012-04-20 DIAGNOSIS — E663 Overweight: Secondary | ICD-10-CM

## 2012-04-20 DIAGNOSIS — K219 Gastro-esophageal reflux disease without esophagitis: Secondary | ICD-10-CM

## 2012-04-20 DIAGNOSIS — I10 Essential (primary) hypertension: Secondary | ICD-10-CM

## 2012-04-20 DIAGNOSIS — I2510 Atherosclerotic heart disease of native coronary artery without angina pectoris: Secondary | ICD-10-CM

## 2012-04-20 DIAGNOSIS — E785 Hyperlipidemia, unspecified: Secondary | ICD-10-CM

## 2012-04-20 NOTE — Progress Notes (Signed)
Subjective:    Patient ID: Alexandra [name], female    DOB: 28-Oct-1923, 78 y.o.   MRN: [id number]  HPI 78 y/o WF here for a follow up visit... he has multiple medical problems including AB, hx inactive Sarcoid, HBP, PVD, VI, Hyperlipidemia, DM, Obesity, GERD/ Divertics/ IBS, DJD/ FM/ osteop, Anxiety, Anemia...  ~  March 19, 2010:  4 mo ROV & she notes stable- Hydroxyzine helps "throat" episodes that occur w/ anxiety; no intercurrent infections;  BP stable on meds;  still too sedentary & not effectively dieting (wt w/o change);  she had BMD 6/11= osteoporotic in left FemNeck & Alendronate recommended to her but she flat out refuses to take the Bisphos Rx "I'll take my chances"...  ~  July 22, 2010:  648mo ROV & weight is down 13# on diet;  she's had occas episodes of palpit, heart racing & notes "throat closing" sensation when this occurs, noted w/ activity & valsalva maneuver eg bending down & assoc w/ SOB but denies CP;  EKG w/ atrial arrhythmias/ PACs & we discussed need for further Cards eval- she requests the [facility] office;  she is currently on ASA, Plavix, Metoprolol 25mg Bid, Avapro 150mg /d, Lasix 40mg /d;  BP well controlled on this...    She has hx old burnt-out Sarcoid> yearly CXR today shows mild cardiomeg, tort Ao, chr incr markings, NAD;  denies cough, sput, chr in her baseline SOB/ DOE, etc...    Her FLP looks good on Simva40;  DM control is excellent on Metformin Bid;  weight down to 190# as noted;  GI appears stable & is followed in [place] now;  Ortho per DrSHarrison in [place] w/ shots every 58mo or so!;  hx Anemia & Hg improved to 11.6 today, Fe=51, Sat=16%, B12 is high;  she uses Hydroxyzine (Vistaril) 25mg  Prn for nerves...  ~  November 18, 2010:  648mo ROV & she seems more anxious & sl more agitated than prev, also weaker, more feeble (ie couldn't step up onto the exam table); she is concerned for her cardiac status> DrHochrein did Holter monitor w/ PACs/ PVCs & no dangerous  arrhythmias & no definite AFib seen; CDopplers ordered for ?CBruit but I cannot find the results in EPIC;  He increased her Metoprolol to 50mg Bid, and she increased her ASA to 81mg Bid as well... She notes that her HYDROXYZINE (Vistaril) helps more than anything & I mentioned it was OK to take this regularly;  See prob list below>>  ~  August 25, 2011:  48mo ROV & we reviewed her interval developments & she continues to f/u w/ DrRothbart regularly...    She had nonQ-MI 10/12 & cath showed severe 3 vessel CAD w/ PTCA/ stent placed in LAD (99% occluded) + med rx w/ Plavix added; there was AK anteriorly but LVF was preserved; DrRothbart did a Myoview scan 3/13 w/ SVT developing after the infusion w/ CP & abn STsegment changes; she spont reverted to NSR w/o need for intervention; Myoview images showed scar anteroseptal area + soft tissue attenuation & minor ischemia at the apex & EF=77%;  They incr her Metoprolol to 75mg Bid...    She saw DrHarrison in [place] 1/13 for a left knee injection. LABS 3/13:  FLP- at goals on Simva40 x TG=157;  Chems- ok x BS=126 A1c=6.0 on Metform;  CBC- Hg=11.3;  TSH=4.11;  BNP=309  ~  December 07, 2011:  648mo ROV & [name] was Adm 7/1 - 12/04/11 w/ unstable angina (pain involved her neck & throat, then across  her chest to arms) and cath showed another 99% focal proxLAD stenosis within the prev stent & mod RCA & Circ disease (including a 90% mid-distal RCA lesion- felt too difficult to intervene); she had a PTCA/ cutting balloon for the LAD in stent restenosis; continue ASA/ Plavix; continue other meds including Metoprolol, Amlodipine, Avapro, Lasix, Simvastatin...  She was also noted to be ANEMIC w/ Hg in the 8-9 range, stools reported neg & she is on FeSO4;  Renal function is borderline w/ Creat in the 0.9-1.2 range & GFR 40-50 range (her Metformin was held in hosp & review of DM control has been good on MetformBid so we decided to restart at just 500mg  Qam)... Finally she notes that "my  nerves are shot" and we will rec Lorazepam 0.5mg - 1/2 to 1 tab Tid as needed...    We reviewed prob list, meds, xrays and labs> see below for updates>> CXR 7/13 showed cardiomegaly & mild vasc congestion... LABS 7/13:  Chems- BS=144 BUN=16 Creat=1.17;  CBC- Hg=8.7  ~  January 20, 2012:  6wk ROV & [name] is here for follow up; she also brought several loose papers from the [place] for her license- I told her we needed the whole package to complete but she was quite insistent that she had to bring the separate pages to all her specialists for completion, so- whatever, we completed what she brought to [place] & returned them to her...     Known CAD w/ recent in-stent restenosis intervention> no further angina/ CP & she is stable on ASA/ Plavix- Cards also wants her to continue ProtonixBid...    We had restarted her Metformin500mg /d & repeat labs today showed BS=118...    She had mild renal insuffic w/ Creat~1.2 & GFR 40-50 range and this is stable...    She had anemia- on FeSO4 and her Hg is up 2 points from disch 8.7 ==> 10.7 w/ FE=45 (16%) today...    We gave her Ativan to try for her anxiety "my nerves are shot" & she feels this is helpful... We reviewed prob list, meds, xrays and labs> see below for updates >> LABS 8/13:  Chems- ok w/ BS=118 BUN=23 Creat=1.2 GFR=44;  CBC- improved w/ Hg=10.7. Fe=45 (sat16%)   ~  April 20, 2012:  34mo ROV & [name] was Adm 10/20 - 03/24/12 by Cards w/ NSTEMI & cath showed in-stent restenosis LAD- s/p cutting balloon angioplasty; she also had resid dis in RCA & CIRC- continue med rx;  They also noted HBP w/ hypotension this adm, asymptomatic SVT on tele, valv heart dis w/ thickened/ calcified AoV & modMR, mod carotid stenoses, mild renal insuffic... Followed by DrRothbart & cardiology notes reviewed & Med Rec performed>>    AB, Hx Sarcoid> not currently on any meds for breathing...    HBP> on Metop50-1.5tabsBid, Amlod2.5, Avapro150, Lasix40; BP=124/56 & she is feeling ok-  denies CP, palpit, ch in SOB, edema, etc...    CAD> on ASA81, Effient5, & off Plavix; Hosp 10/13 as above, now improved; she saw CardsPA 11/13- stable, continue same meds...    ASPVD> on above meds; known bilat 50-69% ICA stenoses but she is asymptomatic w/o cerebral ischemic symptoms; she is wat too sedentary 7 denies claud symptoms...    VI> she knows to elevate legs, elim sodium, use support hose, etc...    Hyperlipid> on Cres10, off Simva due to Norvasc Rx added; we will need to f/u w/ FLP on the Cres10...    DM> on Glimep1mg , off Metform  due to mild RI; BS~110 & A1c=6.3; she knows to avoid sweets, watch sugars...    Overweight> weight = 187# w/ BMI=30; we reviewed diet & exercise...    GI> GERD, Divertics, IBS> on Protonix40Bid; she denies abd pain, n/v, c/d, blood seen...    Renal Insuffic> Creat~ 1.3-1.4 range, they stopped her Metform in hosp & used Glimep1mg  at discharge...    DJD, FM> on Tylenol alone; she saw DrHarrison 2wks ago for bilat knee injections- improved...    Vit D defic> on VitD 1000u OTC; continue same...    Anxiety> on Elavil10, off Lorazepam;     Anemia> on Fe, VitC, VitB12; Hg=10.1, MCV=89, continue supplements... We reviewed prob list, meds, xrays and labs> see below for updates >>  CXR 10/13 showed stable heart size, diffuse mitral annular calcif, clear lungs, DJD in TSpine LABS 10/13:  Chems- ok w/ BUN=25-29 Creat=1.3-1.4 BS=113-116 A1c=6.3;  CBC- Hg=9.9-10.1           Problem List:  Hx of ASTHMATIC BRONCHITIS, ACUTE (ICD-466.0) - uses Mucinex as needed... she has chr stable DOE w/o change... she say she walks "some" but is obviously too sedentary due to her arthritis... she has PNEUMOVAX several yrs ago.  Hx of SARCOIDOSIS (ICD-135) - initially diagnosed in 1992 w/ CXR showing diffuse ILDis, +Gallium scan, ACE=61, & Bronch w/ bx showing non-caseating granulomas... treated w/ Pred & weaned off w/ no active disease since then... last CTChest 7/04 w/ ca++ Ao &  coronaries, +mediastinal fat, centilobular emphysema, some scarring... stable without new symptoms of cough, sputum, CP or dyspnea... ~  CXR 1/09 showed stable chr lung changes & prom right hilum... ~  CXR 2/11 showed diffuse ILDz, no adenopathy, NAD.[name]. ~  CXR 2/12 showed borderline cardiomeg, tort Ao, sl incr markings as before, NAD.[name]. ~  CXR 10/12 in hosp showed cardiomeg, calcif in Ao, vasc congestion w/ pulm edema & sm effusions... ~  CXR 7/13 in hosp showed cardiomegaly & mild vasc congestion... ~  CXR 10/13 showed stable heart size, diffuse mitral annular calcif, clear lungs, DJD in TSpine  HYPERTENSION (ICD-401.9) - controlled on meds: METOPROLOL 50mg - 1.5 tabsBid, AMLODIPINE 2.5mg /d, AVAPRO 150mg /d, LASIX 40mg /d... ~  Myoview 12/05 was neg- no ischemia, no infarct... ~  EKG 2/11 showed NSR, rate 82/min, WNL.[name]. ~  4/12:  DrHochrein increased her Metoprolol from 25Bid to 50Bid for her BP & ectopy... ~  6/12:  BP= 136/74 & denies HA, visual changes, CP, palipit, syncope, change in dyspnea, edema, etc... ~  3/13:  BP= 124/70 & she is feeling well, denies CP/ SOB/ ch in edema/ etc... ~  7/13:  BP= 124/60 & these meds were not changed during her recent hosp for angina w/ cath & PCI for LAD in-stent restenosis... ~  8/13:  BP= 130/60 & she denies CP, palpit, ch in SOB or edema... ~  11/13:  BP= 124/56 on same regimen & she denies CP, palpit, SOB, edema...  VALVULAR HEART DISEASE>  PALPITATIONS & Ventric Ectopy>  Eval & Rx by DrHochrein, his notes are reviewed... On METOPROLOL 75mg Bid & she uses HYDROXYZINE (Vistaril) 25mg  Prn which she says is helpful... ~  Event Monitor done 4/12 and no definite AFib encountered, just PVCs...  ~  EKG shows just some PACs, otherw neg... ~  2DEcho 10/13 showed mild LVH, norm LVF w/ EF=65-70%; AoV w/ mildly thickened & calcif leaflets; heavy mitral annular calcif, mod thickened leaflets, & modMR...  CORONARY ARTERY DISEASE >> on ASA 81mg /d & EFFIENT5mg   (  off Plavix now); plus the meds above & followed by DrRothbart... ~  She had nonQ-MI 10/12 & cath showed severe 3 vessel CAD w/ PTCA/ stent placed in LAD (99% occluded) + med rx w/ Plavix added; there was AK anteriorly but LVF was preserved... ~  EKG 11/12 showed NSR, rate85, PACs, septal infarct, otherw neg... ~  DrRothbart did a Myoview scan 3/13 w/ SVT developing after the infusion w/ CP & abn STsegment changes; she spont reverted to NSR w/o need for intervention; Myoview images showed scar anteroseptal area + soft tissue attenuation & minor ischemia at the apex & EF=77%; They incr her Metoprolol to 75mg Bid... ~  7/13:  She was [facility] for unstable angina w/ cath showing 99% prox LAD in-stent restenosis- PCI w/ cutting ballon & improved; she had mod dis in RCA & Circ (including worsening 90% distal RCA stenosis- but intervention was r/o)... EKG showed NSR, PACs, rate66, otherw wnl, NAD... ~  10/13:  She was [facility] w/ NSTEMI & cath showed in-stent restenosis LAD- s/p cutting balloon angioplasty; she also had resid dis in RCA & CIRC- continue med rx;  They also noted HBP w/ hypotension this adm, asymptomatic SVT on tele, valv heart dis w/ thickened/ calcified AoV & modMR, mod carotid stenoses, mild renal insuffic... Followed by DrRothbart & cardiology notes reviewed & Med Rec performed...  PERIPHERAL VASCULAR DISEASE (ICD-443.9) - on ASA 81, EFFIENT5... doing well w/o claud symptoms or cerebral ischemic symptoms... ~  Art Dopplers of LE's 6/06 showed biphasic waveforms in ant tib & peroneal arts, normal ABI's... ~  CDopplers 5/12 showed mild heterogeneous plaque bilat w/ 40-59% bilat ICA stenoses... ~  CDopplers 4/13 showed mild to mod plaque bilat, 50-69% bilat ICA stenoses (lower end but R>L)...  VENOUS INSUFFICIENCY (ICD-459.81) - on low sodium diet and the Lasix... she knows to elevate legs, wear support hose, etc... ~  7/13:  She has persistent bilat LE edema & weight is similar 192# on LASIX 40mg /d    HYPERLIPIDEMIA (ICD-272.4) - on SIMVASTATIN 40mg /d... ~  FLP 1/08 showed TChol 121, TG 120, HDL 46, LDL 51... looks great- continue Simva40. ~  FLP 1/09 showed TChol 137, TG 165, HDL 37, LDL 67 ~  FLP 9/09 showed TChol 133, TG 141, HDL 47, LDL 57 ~  FLP 5/10 showed TChol 131, TG 190, HDL 46, LDL 47 ~  FLP 2/11 showed TChol 129, TG 129, HDL 52, LDL 51 ~  FLP 2/12 showed TChol 134, TG 159, HDL 49, LDL 53 ~  FLP 3/13 on Simva40 showed TChol 112, TG 157, HDL 46, LDL 35... Continue same + better low fat diet.  DM (ICD-250.00) - on diet + METFORMIN 500mg - 1Bid... unable to exercise or lose wt...  ~  labs 1/08 showed BS=130 and HgA1c=6.1 ~  labs 1/09 showed BS= 137, HgA1c= 6.2... diet and exercise are the keys!!! ~  labs 9/09 showed BS= 119, HgA1c= 6.3 ~  labs 5/10 showed BS= 134, A1c= 6.3 ~  labs 2/11 showed BS= 122, A1c= 6.3 ~  labs 6/11 showed BS= 106 ~  Labs 2/12 showed BS= 138, A1c= 6.3 ~  Labs 3/13 on Metform500Bid showed BS= 126, A1c= 6.0.Marland KitchenMarland Kitchen Continue same, get wt down! ~  7/13:  Metformin was held 7/13 Hosp w/ Creat= 0.9-1.2 & GFR= 40-50 range; we decided to restart METFORMIN 500mg  Qam only... ~  Labs 8/13 on Metform500 showed BS= 118 ~  Labs 10/13 in hosp showed BS=113, A1c=6.3  MORBID OBESITY (ICD-278.01) - prev wt  227#  & 5\' 2"  tall for a BMI=41-42...  ~  weight 1/10 = 217# ~  weight 5/10 = 215# ~  weight 9/10 = 210# ~  weight 2/11 = 210# ~  weight 6/11 = 203# ~  weight 10/11 = 203# ~  Weight 6/12 = 195# ~  Weight 3/13 = 192# ~  Weight 7/13 = 192# ~  Weight 8/13 = 188# ~  Weight 11/13 = 187#  GERD (ICD-530.81) - pt was placed on PROTONIX 40mg Bid 7/13 due to reflux symptoms and Anemia... ~  She is encouraged to f/u w/ GI & she indicates that she will call DrRehman when she is ready...  DIVERTICULOSIS, COLON (ICD-562.10) - had colonoscopy by DrDBrodie 1997 that was neg x divertics... now followed in [place]...  IRRITABLE BOWEL SYNDROME (ICD-564.1)  RENAL  INSUFFICIENCY >> Creat ~1.2 and GFR ~ 40-50 range...  DEGENERATIVE JOINT DISEASE, ADVANCED (ICD-715.90) - she has mod severe bilat knee pain and sees DrHarrison, Ortho for shots "every three months"... she is opposed to surgery (see his EMR notes- reviewed)...  VITAMIN D DEFICIENCY (ICD-268.9) ~  labs 5/10 showed Vit D level =13... therefore rec Vit D 50000 u weekly. ~  labs 2/11 showed Vit D level = 32... OK to change to VitD 1000 u daily.. ~  BMD done 6/11 showed TScores -1.3 in Spine, and -2.5 in left FemNeck... rec> start Alendronate 70mg /wk but she refuses Bisphos Rx "I'll take my chances"...  FIBROMYALGIA (ICD-729.1)  ANXIETY (ICD-300.00) - we prev perscribed HYDROXYZINE PAMOATE 25mg  Prn for "throat closing" symptoms... ~  7/13:  She admits that "my nerves are shot" after recent [facility] & Rx LORAZEPAM 0.5mg - 1/2 to 1 tab Tid as needed...  ANEMIA (ICD-285.9) - hx mild anemia w/ Hg = 10.8 Jan09 & started Fe daily at that time...  ~  labs 5/09 showed Hg= 11.7, Fe= 64 ~  labs 9/09 showed Hg= 11.4, Fe= 43... rec- take FeSO4 w/ Vit C 500mg /d. ~  labs 5/10 showed Hg= 11.3,  Fe= 50 ~  labs 2/11 showed Hg= 10.7, Fe= 36 (12%)... rec> incr Fe Vit C to Bid. ~  labs 6/11 showed Hg= 10.9, Fe= 58 (19%sat)... continue Bid Fe ~  Labs 2/12 showed Hg= 11.6, Fe= 51 (16%sat)... Continue same. ~  Labs 3/13 showed Hg= 11.3.Marland KitchenMarland Kitchen Continue FeSO4 Bid... ~  Labs during 7/13 Hosp showed Hg= 8-9 range... rec to continue FeSO4 Bid... ~  Labs 8/13 showed Hg= 10.7 and Fe= 45 (16%)... Ok to decr Fe to one daily... ~  Labs 10/13 in hosp showed Hg= 10.1  INTERMITTENT VERTIGO (ICD-780.4) - prev eval DrWeymann w/ Meclizine Prn...   Past Surgical History  Procedure Date  . Cholecystectomy   . Tonsillectomy   . Hernia repair   . Cataract extraction   . Coronary angioplasty with stent placement 03/21/2012    Outpatient Encounter Prescriptions as of 04/20/2012  Medication Sig Dispense Refill  . acetaminophen  (TYLENOL) 500 MG tablet Take 500 mg by mouth every 6 (six) hours as needed.        [name] amitriptyline (ELAVIL) 10 MG tablet Take 1 tablet (10 mg total) by mouth at bedtime.  30 tablet  11  . amLODipine (NORVASC) 2.5 MG tablet Take 2.5 mg by mouth at bedtime.      [name] ascorbic acid (VITAMIN C) 500 MG tablet Take 500 mg by mouth 2 (two) times daily. Twice daily with iron tablet      . aspirin EC 81 MG tablet  Take 81 mg by mouth every morning.       . cholecalciferol (VITAMIN D) 1000 UNITS tablet Take 1,000 Units by mouth daily.      [name] econazole nitrate 1 % cream Apply 1 application topically daily as needed. FOR FEET IRRITATION      . ferrous sulfate 324 (65 FE) MG TBEC Take 1 tablet by mouth 2 (two) times daily.       . furosemide (LASIX) 40 MG tablet Take 40 mg by mouth daily.      [name] glimepiride (AMARYL) 1 MG tablet Take 1 tablet (1 mg total) by mouth daily with lunch.  30 tablet  5  . glucose blood test strip 1 each by Other route as needed. Use as instructed       . hydrOXYzine (ATARAX/VISTARIL) 25 MG tablet Take 25 mg by mouth every 6 (six) hours as needed. For itching or anxiety      . irbesartan (AVAPRO) 75 MG tablet Take 1 tablet (75 mg total) by mouth daily.  30 tablet  6  . metoprolol (LOPRESSOR) 50 MG tablet Take  1 1/2 tablets (75 mg ) twice daily      . nitroGLYCERIN (NITROSTAT) 0.4 MG SL tablet Place 1 tablet (0.4 mg total) under the tongue every 5 (five) minutes as needed for chest pain (up to 3 doses).  25 tablet  4  . pantoprazole (PROTONIX) 40 MG tablet Take 1 tablet (40 mg total) by mouth 2 (two) times daily.  60 tablet  6  . prasugrel (EFFIENT) 5 MG TABS Take 1 tablet (5 mg total) by mouth daily.  30 tablet  6  . rosuvastatin (CRESTOR) 10 MG tablet Take 1 tablet (10 mg total) by mouth daily.  30 tablet  6  . vitamin B-12 (CYANOCOBALAMIN) 1000 MCG tablet Take 1,000 mcg by mouth every morning.        Allergies  Allergen Reactions  . Codeine Other (See Comments)    Reaction: severe  GI pain  . Diazepam Other (See Comments)    REACTION: causes pt unable to wake up after taking  . Lipitor (Atorvastatin Calcium) Other (See Comments)    Reaction: causes vertigo  . Penicillins Other (See Comments)    Reaction: unknown (childhood )  . Sulfonamide Derivatives Other (See Comments)    Reaction: unknown (childhood)  . Mango Flavor Rash    Current Medications, Allergies, Past Medical History, Past Surgical History, Family History, and Social History were reviewed in [employer] record.   Review of Systems        See HPI - all other systems neg except as noted... The patient complains of decreased hearing, dyspnea on exertion, peripheral edema, muscle weakness, and difficulty walking.  The patient denies anorexia, fever, weight loss, weight gain, vision loss, hoarseness, chest pain, syncope, prolonged cough, headaches, hemoptysis, abdominal pain, melena, hematochezia, severe indigestion/heartburn, hematuria, incontinence, suspicious skin lesions, transient blindness, depression, unusual weight change, abnormal bleeding, enlarged lymph nodes, and angioedema.   Objective:   Physical Exam      WD, Obese, 78 y/o WF in NAD... GENERAL:  Alert & oriented; pleasant & cooperative... she is very talkative... HEENT:  [place]/AT, Glasses, EOM-full, PERRLA, TMs-wax, NOSE-clear, THROAT-clear & wnl. NECK:  Supple w/ fairROM; no JVD; no carotid bruits; no thyromegaly or nodules palpated; no lymphadenopathy. CHEST:  decr BS bilat but clear w/o wheezing, rales or signs of consolidation... HEART:   sl irregular rhythm; without murmurs/ rubs/ or gallops detected... ABDOMEN:  Obese w/ panniculus, soft & nontender; normal bowel sounds; no organomegaly or masses palpated. EXT:  mod arthritic changes; no varicose veins/ +venous insuffic/ tr+edema bilat NEURO:  CN's intact, no focal neuro deficits... DERM:  few ecchymoses, no rash etc...  RADIOLOGY DATA:  Reviewed in the EPIC EMR  & discussed w/ the patient...  LABORATORY DATA:  Reviewed in the EPIC EMR & discussed w/ the patient...   Assessment & Plan:    AB>  Breathing is stable w/o exac...  Hx Sarcoid>  CXR w/ chr changes, NAD...  HBP>  Controlled on meds, continue same, take regularly...  CAD>  Adm 7/13 w/ unstable angina & had LAD in-stent restenosis, another PCI & improved (see above); disch on similar med regimen w/ ASA & Plavix; then re-adm 10/13 w/ NSTEMI, cath showed another LAD in-stent restenosis, treated w/ cutting balloon angioplasty & changed Plavix to Effient...  Palpit>  On going eval & med adjustments from DrHochrein; better on the incr Metoprolol but notes that the Vistaril really helps...  PVD>  On ASA & Effient now w/o cerebral ischemic symptoms; CDoppler 4/13 w/ sl progression- continue meds...  Ven Insuffic/ Edema>  We reviewed the nec sodium restriction and continue the Lasix40...  HYPERLIPID>  Switched to Cres10 from the Simva due to Norvasc added; due for f/u FLP on ret...  DM>  Metform was held in [place] & they switched her to Glimep1mg ...  GI>  GERD, Divertics, IBS>  Stable, continue Protonix40Bid...  DJD>  Followed by DrHarrison in [place]...  Anxiety>  Off Lorazepam...   Patient's Medications  New Prescriptions   No medications on file  Previous Medications   ACETAMINOPHEN (TYLENOL) 500 MG TABLET    Take 500 mg by mouth every 6 (six) hours as needed.     AMITRIPTYLINE (ELAVIL) 10 MG TABLET    Take 1 tablet (10 mg total) by mouth at bedtime.   AMLODIPINE (NORVASC) 2.5 MG TABLET    Take 2.5 mg by mouth at bedtime.   ASCORBIC ACID (VITAMIN C) 500 MG TABLET    Take 500 mg by mouth 2 (two) times daily. Twice daily with iron tablet   ASPIRIN EC 81 MG TABLET    Take 81 mg by mouth every morning.    CHOLECALCIFEROL (VITAMIN D) 1000 UNITS TABLET    Take 1,000 Units by mouth daily.   ECONAZOLE NITRATE 1 % CREAM    Apply 1 application topically daily as needed. FOR FEET IRRITATION    FERROUS SULFATE 324 (65 FE) MG TBEC    Take 1 tablet by mouth 2 (two) times daily.    FUROSEMIDE (LASIX) 40 MG TABLET    Take 40 mg by mouth daily.   GLIMEPIRIDE (AMARYL) 1 MG TABLET    Take 1 tablet (1 mg total) by mouth daily with lunch.   GLUCOSE BLOOD TEST STRIP    1 each by Other route as needed. Use as instructed    HYDROXYZINE (ATARAX/VISTARIL) 25 MG TABLET    Take 25 mg by mouth every 6 (six) hours as needed. For itching or anxiety   IRBESARTAN (AVAPRO) 150 MG TABLET    Take 150 mg by mouth at bedtime.   METOPROLOL (LOPRESSOR) 50 MG TABLET    Take  1 1/2 tablets (75 mg ) twice daily   NITROGLYCERIN (NITROSTAT) 0.4 MG SL TABLET    Place 1 tablet (0.4 mg total) under the tongue every 5 (five) minutes as needed for chest pain (up to 3 doses).  PANTOPRAZOLE (PROTONIX) 40 MG TABLET    Take 1 tablet (40 mg total) by mouth 2 (two) times daily.   PRASUGREL (EFFIENT) 5 MG TABS    Take 1 tablet (5 mg total) by mouth daily.   ROSUVASTATIN (CRESTOR) 10 MG TABLET    Take 1 tablet (10 mg total) by mouth daily.   VITAMIN B-12 (CYANOCOBALAMIN) 1000 MCG TABLET    Take 1,000 mcg by mouth every morning.  Modified Medications   No medications on file  Discontinued Medications   IRBESARTAN (AVAPRO) 75 MG TABLET    Take 1 tablet (75 mg total) by mouth daily.

## 2012-04-20 NOTE — Patient Instructions (Addendum)
Today we updated your med list in our EPIC system...    Continue your current medications the same...  We reviewed your meds and performed a medication reconciliation on your med list...  Call for any problems...  Let's plan a follow up visit in 3 months.Marland KitchenMarland Kitchen

## 2012-04-21 ENCOUNTER — Other Ambulatory Visit: Payer: Self-pay | Admitting: Physician Assistant

## 2012-05-16 ENCOUNTER — Telehealth: Payer: Self-pay | Admitting: Pulmonary Disease

## 2012-05-16 MED ORDER — IRBESARTAN 150 MG PO TABS: 150.0000 mg | ORAL_TABLET | Freq: Every day | ORAL | Status: DC

## 2012-05-16 NOTE — Telephone Encounter (Signed)
avapro has been sent to the pharmacy for refill.

## 2012-06-03 ENCOUNTER — Other Ambulatory Visit: Payer: Self-pay | Admitting: Pulmonary Disease

## 2012-06-03 ENCOUNTER — Telehealth: Payer: Self-pay | Admitting: Pulmonary Disease

## 2012-06-03 MED ORDER — HYDROXYZINE HCL 25 MG PO TABS: 25.0000 mg | ORAL_TABLET | Freq: Four times a day (QID) | ORAL | Status: DC | PRN

## 2012-06-03 NOTE — Telephone Encounter (Signed)
Spoke with pt to verify the msg Rx was sent to pharm Nothing further needed pre pt

## 2012-06-06 ENCOUNTER — Telehealth: Payer: Self-pay | Admitting: Pulmonary Disease

## 2012-06-06 MED ORDER — HYDROXYZINE PAMOATE 25 MG PO CAPS: 25.0000 mg | ORAL_CAPSULE | Freq: Four times a day (QID) | ORAL | Status: DC | PRN

## 2012-06-06 NOTE — Telephone Encounter (Signed)
Pt aware that we have re-sent her prescription.

## 2012-06-13 ENCOUNTER — Encounter: Payer: Self-pay | Admitting: Cardiology

## 2012-06-13 ENCOUNTER — Ambulatory Visit (INDEPENDENT_AMBULATORY_CARE_PROVIDER_SITE_OTHER): Payer: Medicare Other | Admitting: Cardiology

## 2012-06-13 VITALS — BP 128/70 | HR 72 | Ht 66.0 in | Wt 187.0 lb

## 2012-06-13 DIAGNOSIS — I6523 Occlusion and stenosis of bilateral carotid arteries: Secondary | ICD-10-CM

## 2012-06-13 DIAGNOSIS — I7090 Unspecified atherosclerosis: Secondary | ICD-10-CM

## 2012-06-13 DIAGNOSIS — I2510 Atherosclerotic heart disease of native coronary artery without angina pectoris: Secondary | ICD-10-CM

## 2012-06-13 DIAGNOSIS — I679 Cerebrovascular disease, unspecified: Secondary | ICD-10-CM

## 2012-06-13 DIAGNOSIS — D649 Anemia, unspecified: Secondary | ICD-10-CM

## 2012-06-13 DIAGNOSIS — I10 Essential (primary) hypertension: Secondary | ICD-10-CM

## 2012-06-13 DIAGNOSIS — E785 Hyperlipidemia, unspecified: Secondary | ICD-10-CM

## 2012-06-13 DIAGNOSIS — I658 Occlusion and stenosis of other precerebral arteries: Secondary | ICD-10-CM

## 2012-06-13 NOTE — Assessment & Plan Note (Signed)
Excellent control of hyperlipidemia when last assessed 3 months ago. Patient advised that current therapy is the most expensive without any benefit for the additional expense. Nonetheless, she wishes to continue rosuvastatin.

## 2012-06-13 NOTE — Progress Notes (Deleted)
Name: Alexandra [name]    DOB: [birth year]  Age: 79 y.o.  MR#: [id number]       PCP:  [name], MD      Insurance: @PAYORNAME @   CC:   No chief complaint on file.  MEDICATION LIST CATH 03/2012  VS BP 128/70  Pulse 72  Ht 5\' 6"  (1.676 m)  Wt 187 lb (84.823 kg)  BMI 30.18 kg/m2  SpO2 90%  Weights Current Weight  06/13/12 187 lb (84.823 kg)  04/20/12 187 lb 6.4 oz (85.004 kg)  04/11/12 185 lb 1.3 oz (83.952 kg)    Blood Pressure  BP Readings from Last 3 Encounters:  06/13/12 128/70  04/20/12 124/56  04/11/12 119/54     Admit date:  (Not on file) Last encounter with RMR:  04/01/2012   Allergy Allergies  Allergen Reactions  . Codeine Other (See Comments)    Reaction: severe GI pain  . Diazepam Other (See Comments)    REACTION: causes pt unable to wake up after taking  . Lipitor (Atorvastatin Calcium) Other (See Comments)    Reaction: causes vertigo  . Penicillins Other (See Comments)    Reaction: unknown (childhood )  . Sulfonamide Derivatives Other (See Comments)    Reaction: unknown (childhood)  . Mango Flavor Rash    Current Outpatient Prescriptions  Medication Sig Dispense Refill  . acetaminophen (TYLENOL) 500 MG tablet Take 500 mg by mouth every 6 (six) hours as needed.        [name] amitriptyline (ELAVIL) 10 MG tablet Take 1 tablet (10 mg total) by mouth at bedtime.  30 tablet  11  . amLODipine (NORVASC) 2.5 MG tablet Take 2.5 mg by mouth at bedtime.      [name] ascorbic acid (VITAMIN C) 500 MG tablet Take 500 mg by mouth 2 (two) times daily. Twice daily with iron tablet      . aspirin EC 81 MG tablet Take 81 mg by mouth every morning.       . cholecalciferol (VITAMIN D) 1000 UNITS tablet Take 1,000 Units by mouth daily.      [name] econazole nitrate 1 % cream Apply 1 application topically daily as needed. FOR FEET IRRITATION      . EFFIENT 5 MG TABS TAKE (1) TABLET BY MOUTH ONCE DAILY.  30 each  12  . ferrous sulfate 324 (65 FE) MG TBEC Take 1 tablet by mouth 2 (two) times  daily.       . furosemide (LASIX) 40 MG tablet Take 40 mg by mouth daily.      [name] glimepiride (AMARYL) 1 MG tablet Take 1 tablet (1 mg total) by mouth daily with lunch.  30 tablet  5  . glucose blood test strip 1 each by Other route as needed. Use as instructed       . hydrOXYzine (ATARAX/VISTARIL) 25 MG tablet Take 1 tablet (25 mg total) by mouth every 6 (six) hours as needed. For itching or anxiety  30 tablet  5  . hydrOXYzine (VISTARIL) 25 MG capsule Take 1 capsule (25 mg total) by mouth every 6 (six) hours as needed for itching.  50 capsule  6  . irbesartan (AVAPRO) 150 MG tablet Take 1 tablet (150 mg total) by mouth at bedtime.  30 tablet  6  . metoprolol (LOPRESSOR) 50 MG tablet Take  1 1/2 tablets (75 mg ) twice daily      . nitroGLYCERIN (NITROSTAT) 0.4 MG SL tablet Place 1 tablet (0.4 mg total) under  the tongue every 5 (five) minutes as needed for chest pain (up to 3 doses).  25 tablet  4  . pantoprazole (PROTONIX) 40 MG tablet Take 1 tablet (40 mg total) by mouth 2 (two) times daily.  60 tablet  6  . rosuvastatin (CRESTOR) 10 MG tablet Take 1 tablet (10 mg total) by mouth daily.  30 tablet  6  . vitamin B-12 (CYANOCOBALAMIN) 1000 MCG tablet Take 1,000 mcg by mouth every morning.        Discontinued Meds:   There are no discontinued medications.  Patient Active Problem List  Diagnosis  . Sarcoidosis  . Diabetes mellitus, type II  . VITAMIN D DEFICIENCY  . HYPERLIPIDEMIA  . Overweight  . ANEMIA  . HYPERTENSION  . PERIPHERAL VASCULAR DISEASE  . GERD  . Irritable bowel syndrome  . FIBROMYALGIA  . INTERMITTENT VERTIGO  . Cerebrovascular disease  . Arteriosclerotic cardiovascular disease (ASCVD)  . DJD (degenerative joint disease)  . Paroxysmal supraventricular tachycardia  . Anxiety  . OA (osteoarthritis) of knee  . Venous insufficiency (chronic) (peripheral)  . Edema  . Acute diastolic congestive heart failure  . Chronic kidney disease, stage III (moderate)  .  Cardiorenal syndrome    LABS Appointment on 03/30/2012  Component Date Value  . WBC 03/30/2012 7.8   . RBC 03/30/2012 3.45*  . Hemoglobin 03/30/2012 10.1*  . HCT 03/30/2012 30.9*  . MCV 03/30/2012 89.4   . MCHC 03/30/2012 32.9   . RDW 03/30/2012 13.9   . Platelets 03/30/2012 236.0   . Neutrophils Relative 03/30/2012 73.7   . Lymphocytes Relative 03/30/2012 14.4   . Monocytes Relative 03/30/2012 4.8   . Eosinophils Relative 03/30/2012 6.5*  . Basophils Relative 03/30/2012 0.6   . Neutro Abs 03/30/2012 5.7   . Lymphs Abs 03/30/2012 1.1   . Monocytes Absolute 03/30/2012 0.4   . Eosinophils Absolute 03/30/2012 0.5   . Basophils Absolute 03/30/2012 0.0   . Sodium 03/30/2012 139   . Potassium 03/30/2012 4.7   . Chloride 03/30/2012 103   . CO2 03/30/2012 28   . Glucose, Bld 03/30/2012 113*  . BUN 03/30/2012 25*  . Creatinine, Ser 03/30/2012 1.3*  . Calcium 03/30/2012 9.2   . GFR 03/30/2012 41.26*  . Hemoglobin A1C 03/30/2012 6.3   Admission on 03/20/2012, Discharged on 03/24/2012  Component Date Value  . Sodium 03/20/2012 132*  . Potassium 03/20/2012 3.8   . Chloride 03/20/2012 93*  . CO2 03/20/2012 27   . Glucose, Bld 03/20/2012 138*  . BUN 03/20/2012 28*  . Creatinine, Ser 03/20/2012 1.21*  . Calcium 03/20/2012 9.9   . Total Protein 03/20/2012 7.9   . Albumin 03/20/2012 4.2   . AST 03/20/2012 15   . ALT 03/20/2012 7   . Alkaline Phosphatase 03/20/2012 80   . Total Bilirubin 03/20/2012 0.5   . GFR calc non Af Amer 03/20/2012 39*  . GFR calc Af Amer 03/20/2012 46*  . WBC 03/20/2012 7.7   . RBC 03/20/2012 3.75*  . Hemoglobin 03/20/2012 11.3*  . HCT 03/20/2012 32.7*  . MCV 03/20/2012 87.2   . MCH 03/20/2012 30.1   . MCHC 03/20/2012 34.6   . RDW 03/20/2012 13.5   . Platelets 03/20/2012 173   . Neutrophils Relative 03/20/2012 73   . Neutro Abs 03/20/2012 5.6   . Lymphocytes Relative 03/20/2012 15   . Lymphs Abs 03/20/2012 1.2   . Monocytes Relative 03/20/2012 5     . Monocytes Absolute 03/20/2012 0.4   .  Eosinophils Relative 03/20/2012 6*  . Eosinophils Absolute 03/20/2012 0.5   . Basophils Relative 03/20/2012 0   . Basophils Absolute 03/20/2012 0.0   . Troponin I 03/20/2012 <0.30   . Troponin I 03/20/2012 <0.30   . MRSA by PCR 03/20/2012 NEGATIVE   . Prothrombin Time 03/20/2012 15.7*  . INR 03/20/2012 1.28   . Troponin I 03/20/2012 0.43*  . Troponin I 03/20/2012 1.23*  . Platelet Function  P2Y12 03/20/2012 345   . Glucose-Capillary 03/20/2012 130*  . Comment 1 03/20/2012 Notify RN   . Heparin Unfractionated 03/20/2012 1.31*  . Glucose-Capillary 03/20/2012 131*  . WBC 03/21/2012 8.0   . RBC 03/21/2012 3.12*  . Hemoglobin 03/21/2012 8.9*  . HCT 03/21/2012 27.0*  . MCV 03/21/2012 86.5   . [facility] 03/21/2012 28.5   . MCHC 03/21/2012 33.0   . RDW 03/21/2012 13.6   . Platelets 03/21/2012 134*  . Sodium 03/21/2012 135   . Potassium 03/21/2012 3.9   . Chloride 03/21/2012 100   . CO2 03/21/2012 26   . Glucose, Bld 03/21/2012 133*  . BUN 03/21/2012 27*  . Creatinine, Ser 03/21/2012 1.28*  . Calcium 03/21/2012 8.8   . GFR calc non Af Amer 03/21/2012 37*  . GFR calc Af Amer 03/21/2012 43*  . Heparin Unfractionated 03/21/2012 0.83*  . Glucose-Capillary 03/20/2012 121*  . Cholesterol 03/21/2012 116   . Triglycerides 03/21/2012 97   . HDL 03/21/2012 46   . Total CHOL/HDL Ratio 03/21/2012 2.5   . VLDL 03/21/2012 19   . LDL Cholesterol 03/21/2012 51   . Glucose-Capillary 03/21/2012 134*  . Comment 1 03/21/2012 Notify RN   . Glucose-Capillary 03/21/2012 93   . Comment 1 03/21/2012 Notify RN   . Glucose-Capillary 03/21/2012 125*  . Comment 1 03/21/2012 Notify RN   . WBC 03/22/2012 7.1   . RBC 03/22/2012 2.98*  . Hemoglobin 03/22/2012 8.7*  . HCT 03/22/2012 26.2*  . MCV 03/22/2012 87.9   . MCH 03/22/2012 29.2   . MCHC 03/22/2012 33.2   . RDW 03/22/2012 13.7   . Platelets 03/22/2012 126*  . Sodium 03/22/2012 141   . Potassium 03/22/2012  3.4*  . Chloride 03/22/2012 104   . CO2 03/22/2012 25   . Glucose, Bld 03/22/2012 123*  . BUN 03/22/2012 17   . Creatinine, Ser 03/22/2012 1.11*  . Calcium 03/22/2012 8.7   . GFR calc non Af Amer 03/22/2012 44*  . GFR calc Af Amer 03/22/2012 51*  . Activated Clotting Time 03/21/2012 614   . Glucose-Capillary 03/21/2012 117*  . Comment 1 03/21/2012 Documented in Chart   . Comment 2 03/21/2012 Notify RN   . Glucose-Capillary 03/22/2012 121*  . Glucose-Capillary 03/22/2012 133*  . WBC 03/23/2012 6.7   . RBC 03/23/2012 3.20*  . Hemoglobin 03/23/2012 9.6*  . HCT 03/23/2012 28.4*  . MCV 03/23/2012 88.8   . [facility] 03/23/2012 30.0   . MCHC 03/23/2012 33.8   . RDW 03/23/2012 13.6   . Platelets 03/23/2012 133*  . Sodium 03/23/2012 142   . Potassium 03/23/2012 4.1   . Chloride 03/23/2012 104   . CO2 03/23/2012 26   . Glucose, Bld 03/23/2012 111*  . BUN 03/23/2012 17   . Creatinine, Ser 03/23/2012 1.12*  . Calcium 03/23/2012 9.3   . GFR calc non Af Amer 03/23/2012 43*  . GFR calc Af Amer 03/23/2012 50*  . Glucose-Capillary 03/22/2012 101*  . Comment 1 03/22/2012 Notify RN   . Glucose-Capillary 03/22/2012 132*  . Glucose-Capillary 03/23/2012  106*  . Comment 1 03/23/2012 Notify RN   . Glucose-Capillary 03/23/2012 138*  . Comment 1 03/23/2012 Notify RN   . WBC 03/24/2012 7.2   . RBC 03/24/2012 3.12*  . Hemoglobin 03/24/2012 9.1*  . HCT 03/24/2012 27.3*  . MCV 03/24/2012 87.5   . MCH 03/24/2012 29.2   . MCHC 03/24/2012 33.3   . RDW 03/24/2012 13.6   . Platelets 03/24/2012 149*  . Sodium 03/24/2012 139   . Potassium 03/24/2012 3.9   . Chloride 03/24/2012 100   . CO2 03/24/2012 25   . Glucose, Bld 03/24/2012 114*  . BUN 03/24/2012 18   . Creatinine, Ser 03/24/2012 1.18*  . Calcium 03/24/2012 9.3   . GFR calc non Af Amer 03/24/2012 41*  . GFR calc Af Amer 03/24/2012 47*  . Glucose-Capillary 03/23/2012 130*  . Glucose-Capillary 03/23/2012 123*  . Comment 1 03/23/2012 Notify RN    . Glucose-Capillary 03/24/2012 112*  . Glucose-Capillary 03/24/2012 230*  . Comment 1 03/24/2012 Notify RN   Orders Only on 03/24/2012  Component Date Value  . WBC 03/24/2012 6.9   . RBC 03/24/2012 3.41*  . Hemoglobin 03/24/2012 9.9*  . HCT 03/24/2012 30.1*  . MCV 03/24/2012 88.3   . [facility] 03/24/2012 29.0   . MCHC 03/24/2012 32.9   . RDW 03/24/2012 13.9   . Platelets 03/24/2012 234   . Sodium 03/24/2012 137   . Potassium 03/24/2012 4.5   . Chloride 03/24/2012 102   . CO2 03/24/2012 26   . Glucose, Bld 03/24/2012 116*  . BUN 03/24/2012 29*  . Creat 03/24/2012 1.43*  . Calcium 03/24/2012 9.2      Results for this Opt Visit:     Results for orders placed in visit on 03/30/12  CBC WITH DIFFERENTIAL      Component Value Range   WBC 7.8  4.5 - 10.5 K/uL   RBC 3.45 (*) 3.87 - 5.11 Mil/uL   Hemoglobin 10.1 (*) 12.0 - 15.0 g/dL   HCT 46.9 (*) 62.9 - 52.8 %   MCV 89.4  78.0 - 100.0 fl   MCHC 32.9  30.0 - 36.0 g/dL   RDW 41.3  24.4 - 01.0 %   Platelets 236.0  150.0 - 400.0 K/uL   Neutrophils Relative 73.7  43.0 - 77.0 %   Lymphocytes Relative 14.4  12.0 - 46.0 %   Monocytes Relative 4.8  3.0 - 12.0 %   Eosinophils Relative 6.5 (*) 0.0 - 5.0 %   Basophils Relative 0.6  0.0 - 3.0 %   Neutro Abs 5.7  1.4 - 7.7 K/uL   Lymphs Abs 1.1  0.7 - 4.0 K/uL   Monocytes Absolute 0.4  0.1 - 1.0 K/uL   Eosinophils Absolute 0.5  0.0 - 0.7 K/uL   Basophils Absolute 0.0  0.0 - 0.1 K/uL  BASIC METABOLIC PANEL      Component Value Range   Sodium 139  135 - 145 mEq/L   Potassium 4.7  3.5 - 5.1 mEq/L   Chloride 103  96 - 112 mEq/L   CO2 28  19 - 32 mEq/L   Glucose, Bld 113 (*) 70 - 99 mg/dL   BUN 25 (*) 6 - 23 mg/dL   Creatinine, Ser 1.3 (*) 0.4 - 1.2 mg/dL   Calcium 9.2  8.4 - 27.2 mg/dL   GFR 53.66 (*) >44.03 mL/min  HEMOGLOBIN A1C      Component Value Range   Hemoglobin A1C 6.3  4.6 - 6.5 %  EKG Orders placed during the hospital encounter of 03/20/12  . ED EKG  . EKG 12-LEAD  .  EKG 12-LEAD  . EKG 12-LEAD  . EKG 12-LEAD  . EKG 12-LEAD  . EKG 12-LEAD  . EKG 12-LEAD  . EKG 12-LEAD  . EKG 12-LEAD  . EKG 12-LEAD  . EKG 12-LEAD  . EKG 12-LEAD  . EKG 12-LEAD  . EKG 12-LEAD  . EKG 12-LEAD  . EKG  . EKG 12-LEAD  . EKG 12-LEAD  . EKG 12-LEAD  . EKG 12-LEAD  . EKG 12-LEAD     Prior Assessment and Plan Problem List as of 06/13/2012            Cardiology Problems   HYPERLIPIDEMIA   Last Assessment & Plan Note   04/11/2012 Office Visit Signed 04/11/2012 12:34 PM by Jodelle Gross, NP    She has been changed from Zocor to Crestor as she is also on Norvasc. She is tolerating this well without myalgias. She will have followup lipids and LFTs in 3 months. While hospitalized cholesterol was 116 with HDL of 46 and LDL of 51 with triglycerides of 97.    HYPERTENSION   Last Assessment & Plan Note   04/11/2012 Office Visit Signed 04/11/2012 12:35 PM by Jodelle Gross, NP    Excellent control of blood pressure. We will continue her on her current medications of Avapro 75 mg daily and Lopressor 50 mg twice a day, along with amlodipine 2.5 mg daily.    PERIPHERAL VASCULAR DISEASE   Cerebrovascular disease   Last Assessment & Plan Note   09/14/2011 Office Visit Signed 09/14/2011  1:43 PM by Jodelle Gross, NP    Repeat of her carotid study is essentially unchanged from previous study. No significant stenosis with average between 50-69%.  Continue plavix.     Arteriosclerotic cardiovascular disease (ASCVD)   Last Assessment & Plan Note   04/11/2012 Office Visit Signed 04/11/2012 12:33 PM by Jodelle Gross, NP    She has had in-stent restenosis of a previously placed stent to the proximal LAD within 3 months of placement. She has been changed from clopidogrel to prasugrel and had recent PTCA and cutting balloon angioplasty. Schedules had to place a different stent should she have more problems with in stent restenosis using a resolute integrity DES. At this  time she will be treated with medication changes only. We will see her sooner than every 6 months initially as she has had a recurrence of in-stent restenosis within a very short period of time. I want to keep up with her symptoms which she describes as throbbing pain into her neck. She is tolerant of that prasugrel without complaints of overt bleeding or tarry stools. She has had nitroglycerin available but has not needed to take it. I have reinforced the need for her to not overdo, and if she becomes more tired or feels as throbbing pain in her throat to call us. With her history, she should not waste time ignoring it or treating it herself. And has been advised to come to ER for any recurrent symptoms and possible need for repeat cardiac catheterization and placement of new stent. She verbalizes understanding.    Paroxysmal supraventricular tachycardia   Last Assessment & Plan Note   09/14/2011 Office Visit Signed 09/14/2011  1:42 PM by Jodelle Gross, NP    She has continued complaints of occasional palpitations usually associated with anxiety. Vistaril helps more than anything else. I will  continue her on the  Metoprolol 75 mg BID. She will follow-up with Dr.Rothart at that time. BP is tolerating higher dose of metoprolol without syncope or dizziness.    Venous insufficiency (chronic) (peripheral)   Acute diastolic congestive heart failure   Last Assessment & Plan Note   03/30/2012 Office Visit Signed 03/30/2012 11:31 AM by Julio Sicks, NP    Compensated on present regimen cxr 10/28 w/ no acute process     Cardiorenal syndrome     Other   Sarcoidosis   Diabetes mellitus, type II   Last Assessment & Plan Note   03/30/2012 Office Visit Signed 03/30/2012 11:30 AM by Julio Sicks, NP    Metformin stopped due to renal insufficiency  Check A1C today along w/ bmet  Could not get labs drawn on 10/28 after several requests made.  Begin Amaryl 1mg  daily w/ meals.  Caution for low sugars.  -pt aware      VITAMIN D DEFICIENCY   Overweight   Last Assessment & Plan Note   09/23/2010 Office Visit Signed 09/23/2010  2:56 PM by Rollene Rotunda, MD    She understands the need for weight loss with dietary restriction.    ANEMIA   Last Assessment & Plan Note   07/21/2011 Office Visit Signed 07/21/2011 12:52 PM by Kathlen Brunswick, MD    Anemia has effectively resolved and may have been related to blood loss associated with treatment for acute myocardial infarction.    GERD   Last Assessment & Plan Note   04/27/2011 Office Visit Signed 04/27/2011  4:13 PM by Julio Sicks, NP    Controlled on rx     Irritable bowel syndrome   FIBROMYALGIA   INTERMITTENT VERTIGO   DJD (degenerative joint disease)   Anxiety   OA (osteoarthritis) of knee   Edema   Chronic kidney disease, stage III (moderate)       Imaging: No results found.   FRS Calculation: Score not calculated

## 2012-06-13 NOTE — Progress Notes (Signed)
Patient ID: Alexandra [name], female   DOB: 1923/06/03, 79 y.o.   MRN: [id number]  HPI: Scheduled return visit for this delightful and mentally agile octogenarian with coronary disease, cerebrovascular disease and multiple vascular risk factors. Since her last visit, she has surrendered her driver's license in response to demands by the state that she retest frequently as a result of her multiple medical problems. Despite this, she reports no problems in maintaining her household or in completing her ADLs with the assistance of a housekeeper.  She notes class II dyspnea on exertion, which has not been particularly limiting for her.  Prior to Admission medications   Medication Sig Start Date End Date Taking? Authorizing Provider  acetaminophen (TYLENOL) 500 MG tablet Take 500 mg by mouth every 6 (six) hours as needed.     Yes Historical Provider, MD  amitriptyline (ELAVIL) 10 MG tablet Take 1 tablet (10 mg total) by mouth at bedtime. 01/13/12 01/12/13 Yes [name], MD  amLODipine (NORVASC) 2.5 MG tablet Take 2.5 mg by mouth at bedtime. 07/21/11 07/20/12 Yes Kathlen Brunswick, MD  ascorbic acid (VITAMIN C) 500 MG tablet Take 500 mg by mouth 2 (two) times daily. Twice daily with iron tablet   Yes Historical Provider, MD  aspirin EC 81 MG tablet Take 81 mg by mouth every morning.    Yes Historical Provider, MD  cholecalciferol (VITAMIN D) 1000 UNITS tablet Take 1,000 Units by mouth daily.   Yes Historical Provider, MD  econazole nitrate 1 % cream Apply 1 application topically daily as needed. FOR FEET IRRITATION   Yes Historical Provider, MD  EFFIENT 5 MG TABS TAKE (1) TABLET BY MOUTH ONCE DAILY. 04/21/12  Yes Rollene Rotunda, MD  ferrous sulfate 324 (65 FE) MG TBEC Take 1 tablet by mouth 2 (two) times daily.    Yes Historical Provider, MD  furosemide (LASIX) 40 MG tablet Take 40 mg by mouth daily. 11/10/11  Yes [name], MD  glimepiride (AMARYL) 1 MG tablet Take 1 tablet (1 mg total) by mouth daily with  lunch. 03/30/12  Yes Tammy S Parrett, NP  glucose blood test strip 1 each by Other route as needed. Use as instructed    Yes Historical Provider, MD  hydrOXYzine (ATARAX/VISTARIL) 25 MG tablet Take 1 tablet (25 mg total) by mouth every 6 (six) hours as needed. For itching or anxiety 06/03/12  Yes [name], MD  hydrOXYzine (VISTARIL) 25 MG capsule Take 1 capsule (25 mg total) by mouth every 6 (six) hours as needed for itching. 06/06/12  Yes [name], MD  irbesartan (AVAPRO) 150 MG tablet Take 1 tablet (150 mg total) by mouth at bedtime. 05/16/12  Yes [name], MD  metoprolol (LOPRESSOR) 50 MG tablet Take  1 1/2 tablets (75 mg ) twice daily   Yes Jodelle Gross, NP  nitroGLYCERIN (NITROSTAT) 0.4 MG SL tablet Place 1 tablet (0.4 mg total) under the tongue every 5 (five) minutes as needed for chest pain (up to 3 doses). 03/24/12 03/24/13 Yes Dayna N Dunn, PA  pantoprazole (PROTONIX) 40 MG tablet Take 1 tablet (40 mg total) by mouth 2 (two) times daily. 03/24/12 03/24/13 Yes Dayna N Dunn, PA  rosuvastatin (CRESTOR) 10 MG tablet Take 1 tablet (10 mg total) by mouth daily. 03/24/12  Yes Dayna N Dunn, PA  vitamin B-12 (CYANOCOBALAMIN) 1000 MCG tablet Take 1,000 mcg by mouth every morning.   Yes Historical Provider, MD   Allergies  Allergen Reactions  .  Codeine Other (See Comments)    Reaction: severe GI pain  . Diazepam Other (See Comments)    REACTION: causes pt unable to wake up after taking  . Lipitor (Atorvastatin Calcium) Other (See Comments)    Reaction: causes vertigo  . Penicillins Other (See Comments)    Reaction: unknown (childhood )  . Sulfonamide Derivatives Other (See Comments)    Reaction: unknown (childhood)  . Mango Flavor Rash  Past medical history, social history, and family history reviewed and updated.  ROS: Denies chest pain, orthopnea, PND, palpitations, lightheadedness or syncope. No melena, hematemesis or hematochezia. All other systems reviewed and are  negative.  PHYSICAL EXAM: BP 128/70  Pulse 72  Ht 5\' 6"  (1.676 m)  Wt 84.823 kg (187 lb)  BMI 30.18 kg/m2  SpO2 90%  General-Well developed; no acute distress  Body habitus-overweight  Neck-No JVD; left carotid bruits  Lungs-clear lung fields; resonant to percussion  Cardiovascular-normal PMI; normal S1 and S2; grade 2/6 systolic ejection murmur at the cardiac base  Abdomen-normal bowel sounds; soft and non-tender without masses or organomegaly  Musculoskeletal-No deformities, no cyanosis or clubbing  Neurologic-Normal cranial nerves; symmetric strength and tone  Skin-Warm, no significant lesions  Extremities-distal pulses intact; 1/2+ edema  ASSESSMENT AND PLAN:  McMillin Bing, MD 06/13/2012 10:29 PM

## 2012-06-13 NOTE — Patient Instructions (Addendum)
Your physician recommends that you schedule a follow-up appointment in: 3 months  Your physician has requested that you have a carotid duplex. This test is an ultrasound of the carotid arteries in your neck. It looks at blood flow through these arteries that supply the brain with blood. Allow one hour for this exam. There are no restrictions or special instructions.

## 2012-06-13 NOTE — Assessment & Plan Note (Signed)
Progression in carotid artery disease between 2012 and 2013. A repeat study will be obtained.

## 2012-06-13 NOTE — Assessment & Plan Note (Signed)
Possible iron deficiency. Dr. Kriste Basque is following.

## 2012-06-13 NOTE — Assessment & Plan Note (Signed)
Blood pressure has been under excellent control in recent months; current medication will be continued.

## 2012-06-18 ENCOUNTER — Other Ambulatory Visit: Payer: Self-pay | Admitting: Pulmonary Disease

## 2012-06-20 ENCOUNTER — Ambulatory Visit (HOSPITAL_COMMUNITY)
Admission: RE | Admit: 2012-06-20 | Discharge: 2012-06-20 | Disposition: A | Discharge status: Home / Self Care | Payer: Medicare Other | Source: Ambulatory Visit | Attending: Cardiology | Admitting: Cardiology

## 2012-06-20 DIAGNOSIS — I6523 Occlusion and stenosis of bilateral carotid arteries: Secondary | ICD-10-CM

## 2012-06-20 DIAGNOSIS — I658 Occlusion and stenosis of other precerebral arteries: Secondary | ICD-10-CM | POA: Insufficient documentation

## 2012-06-21 ENCOUNTER — Encounter: Payer: Self-pay | Admitting: Cardiology

## 2012-06-22 ENCOUNTER — Other Ambulatory Visit: Payer: Self-pay | Admitting: *Deleted

## 2012-06-22 ENCOUNTER — Encounter: Payer: Self-pay | Admitting: *Deleted

## 2012-07-06 ENCOUNTER — Ambulatory Visit (INDEPENDENT_AMBULATORY_CARE_PROVIDER_SITE_OTHER): Payer: Medicare Other | Admitting: Orthopedic Surgery

## 2012-07-06 VITALS — BP 114/60 | Ht 66.0 in | Wt 184.0 lb

## 2012-07-06 DIAGNOSIS — M25569 Pain in unspecified knee: Secondary | ICD-10-CM

## 2012-07-06 DIAGNOSIS — M1710 Unilateral primary osteoarthritis, unspecified knee: Secondary | ICD-10-CM

## 2012-07-06 DIAGNOSIS — M1990 Unspecified osteoarthritis, unspecified site: Secondary | ICD-10-CM

## 2012-07-06 NOTE — Progress Notes (Signed)
Patient ID: Alexandra [name], female   DOB: 04/02/1924, 79 y.o.   MRN: [id number] Chief Complaint  Patient presents with  . Follow-up    3 month recheck bilateral knees    Knee  Injection Procedure Note  Pre-operative Diagnosis: left knee oa  Post-operative Diagnosis: same  Indications: pain  Anesthesia: ethyl chloride   Procedure Details   Verbal consent was obtained for the procedure. Time out was completed.The joint was prepped with alcohol, followed by  Ethyl chloride spray and A 20 gauge needle was inserted into the knee via lateral approach; 4ml 1% lidocaine and 1 ml of depomedrol  was then injected into the joint . The needle was removed and the area cleansed and dressed.  Complications:  None; patient tolerated the procedure well. Knee  Injection Procedure Note  Pre-operative Diagnosis: right knee oa  Post-operative Diagnosis: same  Indications: pain  Anesthesia: ethyl chloride   Procedure Details   Verbal consent was obtained for the procedure. Time out was completed.The joint was prepped with alcohol, followed by  Ethyl chloride spray and A 20 gauge needle was inserted into the knee via lateral approach; 4ml 1% lidocaine and 1 ml of depomedrol  was then injected into the joint . The needle was removed and the area cleansed and dressed.  Complications:  None; patient tolerated the procedure well.

## 2012-07-06 NOTE — Patient Instructions (Signed)
You have received a steroid shot. 15% of patients experience increased pain at the injection site with in the next 24 hours. This is best treated with ice and tylenol extra strength 2 tabs every 8 hours. If you are still having pain please call the office.    

## 2012-07-15 ENCOUNTER — Other Ambulatory Visit: Payer: Self-pay | Admitting: Pulmonary Disease

## 2012-07-18 ENCOUNTER — Ambulatory Visit: Payer: Medicare Other | Admitting: Cardiology

## 2012-07-25 ENCOUNTER — Encounter: Payer: Self-pay | Admitting: Pulmonary Disease

## 2012-07-25 ENCOUNTER — Other Ambulatory Visit (INDEPENDENT_AMBULATORY_CARE_PROVIDER_SITE_OTHER): Payer: Medicare Other

## 2012-07-25 ENCOUNTER — Ambulatory Visit (INDEPENDENT_AMBULATORY_CARE_PROVIDER_SITE_OTHER): Payer: Medicare Other | Admitting: Pulmonary Disease

## 2012-07-25 VITALS — BP 118/72 | HR 67 | Temp 97.2°F | Ht 66.0 in | Wt 187.6 lb

## 2012-07-25 DIAGNOSIS — I7090 Unspecified atherosclerosis: Secondary | ICD-10-CM

## 2012-07-25 DIAGNOSIS — E119 Type 2 diabetes mellitus without complications: Secondary | ICD-10-CM

## 2012-07-25 DIAGNOSIS — F419 Anxiety disorder, unspecified: Secondary | ICD-10-CM

## 2012-07-25 DIAGNOSIS — I872 Venous insufficiency (chronic) (peripheral): Secondary | ICD-10-CM

## 2012-07-25 DIAGNOSIS — D869 Sarcoidosis, unspecified: Secondary | ICD-10-CM

## 2012-07-25 DIAGNOSIS — N183 Chronic kidney disease, stage 3 unspecified: Secondary | ICD-10-CM

## 2012-07-25 DIAGNOSIS — M1710 Unilateral primary osteoarthritis, unspecified knee: Secondary | ICD-10-CM

## 2012-07-25 DIAGNOSIS — I739 Peripheral vascular disease, unspecified: Secondary | ICD-10-CM

## 2012-07-25 DIAGNOSIS — I10 Essential (primary) hypertension: Secondary | ICD-10-CM

## 2012-07-25 DIAGNOSIS — D649 Anemia, unspecified: Secondary | ICD-10-CM

## 2012-07-25 DIAGNOSIS — E785 Hyperlipidemia, unspecified: Secondary | ICD-10-CM

## 2012-07-25 DIAGNOSIS — F411 Generalized anxiety disorder: Secondary | ICD-10-CM

## 2012-07-25 DIAGNOSIS — E559 Vitamin D deficiency, unspecified: Secondary | ICD-10-CM

## 2012-07-25 DIAGNOSIS — E663 Overweight: Secondary | ICD-10-CM

## 2012-07-25 DIAGNOSIS — I2510 Atherosclerotic heart disease of native coronary artery without angina pectoris: Secondary | ICD-10-CM

## 2012-07-25 DIAGNOSIS — M179 Osteoarthritis of knee, unspecified: Secondary | ICD-10-CM

## 2012-07-25 LAB — CBC WITH DIFFERENTIAL/PLATELET
Eosinophils Relative: 4.8 % (ref 0.0–5.0)
HCT: 32.9 % — ABNORMAL LOW (ref 36.0–46.0)
Hemoglobin: 11.1 g/dL — ABNORMAL LOW (ref 12.0–15.0)
Lymphocytes Relative: 12.5 % (ref 12.0–46.0)
Lymphs Abs: 0.9 10*3/uL (ref 0.7–4.0)
Monocytes Relative: 5.1 % (ref 3.0–12.0)
Neutro Abs: 5.8 10*3/uL (ref 1.4–7.7)
WBC: 7.6 10*3/uL (ref 4.5–10.5)

## 2012-07-25 LAB — HEMOGLOBIN A1C: Hgb A1c MFr Bld: 6.1 % (ref 4.6–6.5)

## 2012-07-25 LAB — IBC PANEL
Iron: 79 ug/dL (ref 42–145)
Saturation Ratios: 27.1 % (ref 20.0–50.0)

## 2012-07-25 LAB — BASIC METABOLIC PANEL
GFR: 39.81 mL/min — ABNORMAL LOW (ref >60.00)
Potassium: 4.4 mEq/L (ref 3.5–5.1)
Sodium: 140 mEq/L (ref 135–145)

## 2012-07-25 NOTE — Progress Notes (Signed)
Subjective:    Patient ID: Alexandra [name], female    DOB: Dec 15, 1923, 79 y.o.   MRN: [id number]  HPI 79 y/o WF here for a follow up visit... he has multiple medical problems including AB, hx inactive Sarcoid, HBP, PVD, VI, Hyperlipidemia, DM, Obesity, GERD/ Divertics/ IBS, DJD/ FM/ osteop, Anxiety, Anemia...  ~  August 25, 2011:  60mo ROV & we reviewed her interval developments & she continues to f/u w/ DrRothbart regularly...    She had nonQ-MI 10/12 & cath showed severe 3 vessel CAD w/ PTCA/ stent placed in LAD (99% occluded) + med rx w/ Plavix added; there was AK anteriorly but LVF was preserved; DrRothbart did a Myoview scan 3/13 w/ SVT developing after the infusion w/ CP & abn STsegment changes; she spont reverted to NSR w/o need for intervention; Myoview images showed scar anteroseptal area + soft tissue attenuation & minor ischemia at the apex & EF=77%;  They incr her Metoprolol to 75mg Bid...    She saw DrHarrison in [place] 1/13 for a left knee injection. LABS 3/13:  FLP- at goals on Simva40 x TG=157;  Chems- ok x BS=126 A1c=6.0 on Metform;  CBC- Hg=11.3;  TSH=4.11;  BNP=309  ~  December 07, 2011:  30mo ROV & Alexandra [name] was Adm 7/1 - 12/04/11 w/ unstable angina (pain involved her neck & throat, then across her chest to arms) and cath showed another 99% focal proxLAD stenosis within the prev stent & mod RCA & Circ disease (including a 90% mid-distal RCA lesion- felt too difficult to intervene); she had a PTCA/ cutting balloon for the LAD in stent restenosis; continue ASA/ Plavix; continue other meds including Metoprolol, Amlodipine, Avapro, Lasix, Simvastatin...  She was also noted to be ANEMIC w/ Hg in the 8-9 range, stools reported neg & she is on FeSO4;  Renal function is borderline w/ Creat in the 0.9-1.2 range & GFR 40-50 range (her Metformin was held in hosp & review of DM control has been good on MetformBid so we decided to restart at just 500mg  Qam)... Finally she notes that "my nerves are shot" and we  will rec Lorazepam 0.5mg - 1/2 to 1 tab Tid as needed...    We reviewed prob list, meds, xrays and labs> see below for updates>> CXR 7/13 showed cardiomegaly & mild vasc congestion... LABS 7/13:  Chems- BS=144 BUN=16 Creat=1.17;  CBC- Hg=8.7  ~  January 20, 2012:  6wk ROV & Alexandra [name] is here for follow up; she also brought several loose papers from the [employer] for her license- I told her we needed the whole package to complete but she was quite insistent that she had to bring the separate pages to all her specialists for completion, so- whatever, we completed what she brought to [place] & returned them to her...     Known CAD w/ recent in-stent restenosis intervention> no further angina/ CP & she is stable on ASA/ Plavix- Cards also wants her to continue ProtonixBid...    We had restarted her Metformin500mg /d & repeat labs today showed BS=118...    She had mild renal insuffic w/ Creat~1.2 & GFR 40-50 range and this is stable...    She had anemia- on FeSO4 and her Hg is up 2 points from disch 8.7 ==> 10.7 w/ FE=45 (16%) today...    We gave her Ativan to try for her anxiety "my nerves are shot" & she feels this is helpful... We reviewed prob list, meds, xrays and labs> see below for updates >> LABS 8/13:  Chems- ok w/ BS=118 BUN=23 Creat=1.2 GFR=44;  CBC- improved w/ Hg=10.7. Fe=45 (sat16%)   ~  April 20, 2012:  12mo ROV & Alexandra [name] was Adm 10/20 - 03/24/12 by Cards w/ NSTEMI & cath showed in-stent restenosis LAD- s/p cutting balloon angioplasty; she also had resid dis in RCA & CIRC- continue med rx;  They also noted HBP w/ hypotension this adm, asymptomatic SVT on tele, valv heart dis w/ thickened/ calcified AoV & modMR, mod carotid stenoses, mild renal insuffic... Followed by DrRothbart & cardiology notes reviewed & Med Rec performed>>    AB, Hx Sarcoid> not currently on any meds for breathing...    HBP> on Metop50-1.5tabsBid, Amlod2.5, Avapro150, Lasix40; BP=124/56 & she is feeling ok- denies CP, palpit, ch in SOB,  edema, etc...    CAD> on ASA81, Effient5, & off Plavix; Hosp 10/13 as above, now improved; she saw CardsPA 11/13- stable, continue same meds...    ASPVD> on above meds; known bilat 50-69% ICA stenoses but she is asymptomatic w/o cerebral ischemic symptoms; she is wat too sedentary 7 denies claud symptoms...    VI> she knows to elevate legs, elim sodium, use support hose, etc...    Hyperlipid> on Cres10, off Simva due to Norvasc Rx added; we will need to f/u w/ FLP on the Cres10...    DM> on Glimep1mg , off Metform due to mild RI; BS~110 & A1c=6.3; she knows to avoid sweets, watch sugars...    Overweight> weight = 187# w/ BMI=30; we reviewed diet & exercise...    GI> GERD, Divertics, IBS> on Protonix40Bid; she denies abd pain, n/v, c/d, blood seen...    Renal Insuffic> Creat~ 1.3-1.4 range, they stopped her Metform in hosp & used Glimep1mg  at discharge...    DJD, FM> on Tylenol alone; she saw DrHarrison 2wks ago for bilat knee injections- improved...    Vit D defic> on VitD 1000u OTC; continue same...    Anxiety> on Elavil10, off Lorazepam;     Anemia> on Fe, VitC, VitB12; Hg=10.1, MCV=89, continue supplements... We reviewed prob list, meds, xrays and labs> see below for updates >>  CXR 10/13 showed stable heart size, diffuse mitral annular calcif, clear lungs, DJD in TSpine LABS 10/13:  Chems- ok w/ BUN=25-29 Creat=1.3-1.4 BS=113-116 A1c=6.3;  CBC- Hg=9.9-10.1    ~  July 25, 2012:  12mo ROV & Alexandra [name] quips "I'm still here" w/ continued cardiac problems, angina, & need for NTG rx... She notes her "nerves run away w/ me" & the Vistaril helps... She is not fond of her apt in [place] & thinking of [place]... We reviewed the following medical problems during today's office visit >>      AB, Hx Sarcoid> not currently on any meds for breathing; stable, inactive dis...    HBP> on Metop50-1.5tabsBid, Amlod2.5, Avapro150, Lasix40; BP=118/72 & she is feeling ok- denies current CP, palpit, ch in SOB, edema, etc...     CAD> on ASA81, Effient5, & off Plavix; Hosp 10/13 as above, now improved; she saw CardsPA 11/13- stable, continue same meds...    ASPVD> on above meds; known bilat 50-69% ICA stenoses but she is asymptomatic w/o cerebral ischemic symptoms; she is wat too sedentary & denies claud symptoms...    VI> she knows to elevate legs, elim sodium, use support hose, etc...    Hyperlipid> on Cres10, off Simva due to Norvasc Rx added; we will need to f/u w/ FLP on the Cres10...    DM> on Glimep1mg , off Metform due to mild RI; BS=85 &  A1c=6.1; we decided to decr the Glimep1mg  to 1/2 tab/d...    Overweight> weight = 188# w/ BMI=30; we reviewed diet & exercise...    GI> GERD, Divertics, IBS> on Protonix40Bid; she denies abd pain, n/v, c/d, blood seen...    Renal Insuffic> Creat~ 1.3-1.4 range, they stopped her Metform in hosp & used Glimep1mg  at discharge...    DJD, FM> on Tylenol alone; she saw DrHarrison 2wks ago for bilat knee injections- improved...    Vit D defic> on VitD 1000u OTC; continue same...    Anxiety> on Elavil10, and Hydroxyzine25 prn, off Lorazepam; states the Hydroxyzine helps...    Anemia> on Fe, VitC, VitB12; Hg=11.1, Fe=79 (27%sat); continue supplements...  We reviewed prob list, meds, xrays and labs> see below for updates >>  LABS 2/14:  Chems- ok w/ Cr=1.3 BS=85 A1c=6.1;  CBC- Hg=11.1 Fe=79 (27%sat)...         Problem List:  Hx of ASTHMATIC BRONCHITIS, ACUTE (ICD-466.0) - uses Mucinex as needed... she has chr stable DOE w/o change... she say she walks "some" but is obviously too sedentary due to her arthritis... she has PNEUMOVAX several yrs ago.  Hx of SARCOIDOSIS (ICD-135) - initially diagnosed in 1992 w/ CXR showing diffuse ILDis, +Gallium scan, ACE=61, & Bronch w/ bx showing non-caseating granulomas... treated w/ Pred & weaned off w/ no active disease since then... last CTChest 7/04 w/ ca++ Ao & coronaries, +mediastinal fat, centilobular emphysema, some scarring... stable without new  symptoms of cough, sputum, CP or dyspnea... ~  CXR 1/09 showed stable chr lung changes & prom right hilum... ~  CXR 2/11 showed diffuse ILDz, no adenopathy, NAD.[name]. ~  CXR 2/12 showed borderline cardiomeg, tort Ao, sl incr markings as before, NAD.[name]. ~  CXR 10/12 in hosp showed cardiomeg, calcif in Ao, vasc congestion w/ pulm edema & sm effusions... ~  CXR 7/13 in hosp showed cardiomegaly & mild vasc congestion... ~  CXR 10/13 showed stable heart size, diffuse mitral annular calcif, clear lungs, DJD in TSpine  HYPERTENSION (ICD-401.9) - controlled on meds: METOPROLOL 50mg - 1.5 tabsBid, AMLODIPINE 2.5mg /d, AVAPRO 150mg /d, LASIX 40mg /d... ~  Myoview 12/05 was neg- no ischemia, no infarct... ~  EKG 2/11 showed NSR, rate 82/min, WNL.[name]. ~  4/12:  DrHochrein increased her Metoprolol from 25Bid to 50Bid for her BP & ectopy... ~  6/12:  BP= 136/74 & denies HA, visual changes, CP, palipit, syncope, change in dyspnea, edema, etc... ~  3/13:  BP= 124/70 & she is feeling well, denies CP/ SOB/ ch in edema/ etc... ~  7/13:  BP= 124/60 & these meds were not changed during her recent hosp for angina w/ cath & PCI for LAD in-stent restenosis... ~  8/13:  BP= 130/60 & she denies CP, palpit, ch in SOB or edema... ~  11/13:  BP= 124/56 on same regimen & she denies CP, palpit, SOB, edema... ~  2/14: on Metop50-1.5tabsBid, Amlod2.5, Avapro150, Lasix40; BP=118/72 & she is feeling ok- denies current CP, palpit, ch in SOB, edema, etc.  VALVULAR HEART DISEASE>  PALPITATIONS & Ventric Ectopy>  Eval & Rx by DrHochrein, his notes are reviewed... On METOPROLOL 75mg Bid & she uses HYDROXYZINE (Vistaril) 25mg  Prn which she says is helpful... ~  Event Monitor done 4/12 and no definite AFib encountered, just PVCs...  ~  EKG shows just some PACs, otherw neg... ~  2DEcho 10/13 showed mild LVH, norm LVF w/ EF=65-70%; AoV w/ mildly thickened & calcif leaflets; heavy mitral annular calcif, mod thickened leaflets, &  modMR...  CORONARY ARTERY DISEASE >> on ASA 81mg /d & EFFIENT5mg  (off Plavix now); plus the meds above & followed by DrRothbart... ~  She had nonQ-MI 10/12 & cath showed severe 3 vessel CAD w/ PTCA/ stent placed in LAD (99% occluded) + med rx w/ Plavix added; there was AK anteriorly but LVF was preserved... ~  EKG 11/12 showed NSR, rate85, PACs, septal infarct, otherw neg... ~  DrRothbart did a Myoview scan 3/13 w/ SVT developing after the infusion w/ CP & abn STsegment changes; she spont reverted to NSR w/o need for intervention; Myoview images showed scar anteroseptal area + soft tissue attenuation & minor ischemia at the apex & EF=77%; They incr her Metoprolol to 75mg Bid... ~  7/13:  She was [facility] for unstable angina w/ cath showing 99% prox LAD in-stent restenosis- PCI w/ cutting ballon & improved; she had mod dis in RCA & Circ (including worsening 90% distal RCA stenosis- but intervention was r/o)... EKG showed NSR, PACs, rate66, otherw wnl, NAD... ~  10/13:  She was [facility] w/ NSTEMI & cath showed in-stent restenosis LAD- s/p cutting balloon angioplasty; she also had resid dis in RCA & CIRC- continue med rx;  They also noted HBP w/ hypotension this adm, asymptomatic SVT on tele, valv heart dis w/ thickened/ calcified AoV & modMR, mod carotid stenoses, mild renal insuffic... Followed by DrRothbart & cardiology notes reviewed & Med Rec performed... ~  2/14: on ASA81, Effient5, & off Plavix; Hosp 10/13 as above, now improved; she saw CardsPA 11/13- stable, continue same meds...  PERIPHERAL VASCULAR DISEASE (ICD-443.9) - on ASA 81, EFFIENT5... doing well w/o claud symptoms or cerebral ischemic symptoms... ~  Art Dopplers of LE's 6/06 showed biphasic waveforms in ant tib & peroneal arts, normal ABI's... ~  CDopplers 5/12 showed mild heterogeneous plaque bilat w/ 40-59% bilat ICA stenoses... ~  CDopplers 4/13 showed mild to mod plaque bilat, 50-69% bilat ICA stenoses (lower end but R>L)...  VENOUS  INSUFFICIENCY (ICD-459.81) - on low sodium diet and the Lasix... she knows to elevate legs, wear support hose, etc... ~  7/13:  She has persistent bilat LE edema & weight is similar 192# on LASIX 40mg /d   HYPERLIPIDEMIA (ICD-272.4) - on SIMVASTATIN 40mg /d... ~  FLP 1/08 showed TChol 121, TG 120, HDL 46, LDL 51... looks great- continue Simva40. ~  FLP 1/09 showed TChol 137, TG 165, HDL 37, LDL 67 ~  FLP 9/09 showed TChol 133, TG 141, HDL 47, LDL 57 ~  FLP 5/10 showed TChol 131, TG 190, HDL 46, LDL 47 ~  FLP 2/11 showed TChol 129, TG 129, HDL 52, LDL 51 ~  FLP 2/12 showed TChol 134, TG 159, HDL 49, LDL 53 ~  FLP 3/13 on Simva40 showed TChol 112, TG 157, HDL 46, LDL 35... Continue same + better low fat diet.  DM (ICD-250.00) - on diet + METFORMIN 500mg - 1Bid... unable to exercise or lose wt...  ~  labs 1/08 showed BS=130 and HgA1c=6.1 ~  labs 1/09 showed BS= 137, HgA1c= 6.2... diet and exercise are the keys!!! ~  labs 9/09 showed BS= 119, HgA1c= 6.3 ~  labs 5/10 showed BS= 134, A1c= 6.3 ~  labs 2/11 showed BS= 122, A1c= 6.3 ~  labs 6/11 showed BS= 106 ~  Labs 2/12 showed BS= 138, A1c= 6.3 ~  Labs 3/13 on Metform500Bid showed BS= 126, A1c= 6.0.Marland KitchenMarland Kitchen Continue same, get wt down! ~  7/13:  Metformin was held 7/13 Hosp w/ Creat= 0.9-1.2 & GFR= 40-50  range; we decided to restart METFORMIN 500mg  Qam only... ~  Labs 8/13 on Metform500 showed BS= 118 ~  Labs 10/13 in hosp showed BS=113, A1c=6.3 ~  2/14: on Glimep1mg , off Metform due to mild RI; BS=85 & A1c=6.1; we decided to decr the Glimep1mg  to 1/2 tab/d.  MORBID OBESITY (ICD-278.01) - prev wt 227#  & 5\' 2"  tall for a BMI=41-42...  ~  weight 1/10 = 217# ~  weight 5/10 = 215# ~  weight 9/10 = 210# ~  weight 2/11 = 210# ~  weight 6/11 = 203# ~  weight 10/11 = 203# ~  Weight 6/12 = 195# ~  Weight 3/13 = 192# ~  Weight 7/13 = 192# ~  Weight 8/13 = 188# ~  Weight 11/13 = 187# ~  Weight 2/14 = 188#  GERD (ICD-530.81) - pt was placed on PROTONIX  40mg Bid 7/13 due to reflux symptoms and Anemia... ~  She is encouraged to f/u w/ GI & she indicates that she will call DrRehman when she is ready...  DIVERTICULOSIS, COLON (ICD-562.10) - had colonoscopy by DrDBrodie 1997 that was neg x divertics... now followed in [place]...  IRRITABLE BOWEL SYNDROME (ICD-564.1)  RENAL INSUFFICIENCY >> Creat ~1.2 and GFR ~ 40-50 range...  DEGENERATIVE JOINT DISEASE, ADVANCED (ICD-715.90) - she has mod severe bilat knee pain and sees DrHarrison, Ortho for shots "every three months"... she is opposed to surgery (see his EMR notes- reviewed)...  VITAMIN D DEFICIENCY (ICD-268.9) ~  labs 5/10 showed Vit D level =13... therefore rec Vit D 50000 u weekly. ~  labs 2/11 showed Vit D level = 32... OK to change to VitD 1000 u daily.. ~  BMD done 6/11 showed TScores -1.3 in Spine, and -2.5 in left FemNeck... rec> start Alendronate 70mg /wk but she refuses Bisphos Rx "I'll take my chances"...  FIBROMYALGIA (ICD-729.1)  ANXIETY (ICD-300.00) - we prev perscribed HYDROXYZINE PAMOATE 25mg  Prn for "throat closing" symptoms... ~  7/13:  She admits that "my nerves are shot" after recent [facility] & Rx LORAZEPAM 0.5mg - 1/2 to 1 tab Tid as needed...  ANEMIA (ICD-285.9) - hx mild anemia w/ Hg = 10.8 Jan09 & started Fe daily at that time...  ~  labs 5/09 showed Hg= 11.7, Fe= 64 ~  labs 9/09 showed Hg= 11.4, Fe= 43... rec- take FeSO4 w/ Vit C 500mg /d. ~  labs 5/10 showed Hg= 11.3,  Fe= 50 ~  labs 2/11 showed Hg= 10.7, Fe= 36 (12%)... rec> incr Fe Vit C to Bid. ~  labs 6/11 showed Hg= 10.9, Fe= 58 (19%sat)... continue Bid Fe ~  Labs 2/12 showed Hg= 11.6, Fe= 51 (16%sat)... Continue same. ~  Labs 3/13 showed Hg= 11.3.Marland KitchenMarland Kitchen Continue FeSO4 Bid... ~  Labs during 7/13 Hosp showed Hg= 8-9 range... rec to continue FeSO4 Bid... ~  Labs 8/13 showed Hg= 10.7 and Fe= 45 (16%)... Ok to decr Fe to one daily... ~  Labs 10/13 in hosp showed Hg= 10.1 ~  2/14: on Fe, VitC, VitB12; Hg=11.1,  Fe=79 (27%sat); continue supplements.  INTERMITTENT VERTIGO (ICD-780.4) - prev eval DrWeymann w/ Meclizine Prn...   Past Surgical History  Procedure Laterality Date  . Cholecystectomy    . Tonsillectomy    . Hernia repair    . Cataract extraction    . Coronary angioplasty with stent placement  03/21/2012    Outpatient Encounter Prescriptions as of 07/25/2012  Medication Sig Dispense Refill  . acetaminophen (TYLENOL) 500 MG tablet Take 500 mg by mouth every 6 (six) hours as  needed.        [name] amitriptyline (ELAVIL) 10 MG tablet Take 1 tablet (10 mg total) by mouth at bedtime.  30 tablet  11  . amLODipine (NORVASC) 2.5 MG tablet Take 2.5 mg by mouth at bedtime.      [name] ascorbic acid (VITAMIN C) 500 MG tablet Take 500 mg by mouth 2 (two) times daily. Twice daily with iron tablet      . aspirin EC 81 MG tablet Take 81 mg by mouth every morning.       . cholecalciferol (VITAMIN D) 1000 UNITS tablet Take 1,000 Units by mouth daily.      [name] econazole nitrate 1 % cream Apply 1 application topically daily as needed. FOR FEET IRRITATION      . EFFIENT 5 MG TABS TAKE (1) TABLET BY MOUTH ONCE DAILY.  30 each  12  . ferrous sulfate 324 (65 FE) MG TBEC Take 1 tablet by mouth 2 (two) times daily.       . furosemide (LASIX) 40 MG tablet Take 40 mg by mouth daily.      . furosemide (LASIX) 40 MG tablet TAKE (1) TABLET BY MOUTH ONCE DAILY.  30 tablet  6  . furosemide (LASIX) 40 MG tablet TAKE (1) TABLET BY MOUTH ONCE DAILY.  30 tablet  6  . glimepiride (AMARYL) 1 MG tablet Take 1 tablet (1 mg total) by mouth daily with lunch.  30 tablet  5  . glucose blood test strip 1 each by Other route as needed. Use as instructed       . hydrOXYzine (ATARAX/VISTARIL) 25 MG tablet Take 1 tablet (25 mg total) by mouth every 6 (six) hours as needed. For itching or anxiety  30 tablet  5  . hydrOXYzine (VISTARIL) 25 MG capsule Take 1 capsule (25 mg total) by mouth every 6 (six) hours as needed for itching.  50 capsule  6  .  irbesartan (AVAPRO) 150 MG tablet Take 1 tablet (150 mg total) by mouth at bedtime.  30 tablet  6  . metoprolol (LOPRESSOR) 50 MG tablet Take  1 1/2 tablets (75 mg ) twice daily      . nitroGLYCERIN (NITROSTAT) 0.4 MG SL tablet Place 1 tablet (0.4 mg total) under the tongue every 5 (five) minutes as needed for chest pain (up to 3 doses).  25 tablet  4  . pantoprazole (PROTONIX) 40 MG tablet Take 1 tablet (40 mg total) by mouth 2 (two) times daily.  60 tablet  6  . rosuvastatin (CRESTOR) 10 MG tablet Take 1 tablet (10 mg total) by mouth daily.  30 tablet  6  . vitamin B-12 (CYANOCOBALAMIN) 1000 MCG tablet Take 1,000 mcg by mouth every morning.       No facility-administered encounter medications on file as of 07/25/2012.    Allergies  Allergen Reactions  . Codeine Other (See Comments)    Reaction: severe GI pain  . Diazepam Other (See Comments)    REACTION: causes pt unable to wake up after taking  . Lipitor (Atorvastatin Calcium) Other (See Comments)    Reaction: causes vertigo  . Penicillins Other (See Comments)    Reaction: unknown (childhood )  . Sulfonamide Derivatives Other (See Comments)    Reaction: unknown (childhood)  . Mango Flavor Rash    Current Medications, Allergies, Past Medical History, Past Surgical History, Family History, and Social History were reviewed in [employer] record.   Review of Systems  See HPI - all other systems neg except as noted... The patient complains of decreased hearing, dyspnea on exertion, peripheral edema, muscle weakness, and difficulty walking.  The patient denies anorexia, fever, weight loss, weight gain, vision loss, hoarseness, chest pain, syncope, prolonged cough, headaches, hemoptysis, abdominal pain, melena, hematochezia, severe indigestion/heartburn, hematuria, incontinence, suspicious skin lesions, transient blindness, depression, unusual weight change, abnormal bleeding, enlarged lymph nodes, and  angioedema.   Objective:   Physical Exam      WD, Obese, 79 y/o WF in NAD... GENERAL:  Alert & oriented; pleasant & cooperative... she is very talkative... HEENT:  [place]/AT, Glasses, EOM-full, PERRLA, TMs-wax, NOSE-clear, THROAT-clear & wnl. NECK:  Supple w/ fairROM; no JVD; no carotid bruits; no thyromegaly or nodules palpated; no lymphadenopathy. CHEST:  decr BS bilat but clear w/o wheezing, rales or signs of consolidation... HEART:   sl irregular rhythm; without murmurs/ rubs/ or gallops detected... ABDOMEN:  Obese w/ panniculus, soft & nontender; normal bowel sounds; no organomegaly or masses palpated. EXT:  mod arthritic changes; no varicose veins/ +venous insuffic/ tr+edema bilat NEURO:  CN's intact, no focal neuro deficits... DERM:  few ecchymoses, no rash etc...  RADIOLOGY DATA:  Reviewed in the EPIC EMR & discussed w/ the patient...  LABORATORY DATA:  Reviewed in the EPIC EMR & discussed w/ the patient...   Assessment & Plan:    AB>  Breathing is stable w/o exac...  Hx Sarcoid>  CXR w/ chr changes, NAD...  HBP>  Controlled on meds, continue same, take regularly...  CAD>  Adm 7/13 w/ unstable angina & had LAD in-stent restenosis, another PCI & improved (see above); disch on similar med regimen w/ ASA & Plavix; then re-adm 10/13 w/ NSTEMI, cath showed another LAD in-stent restenosis, treated w/ cutting balloon angioplasty & changed Plavix to Effient...  Palpit>  On going eval & med adjustments from DrHochrein; better on the incr Metoprolol but notes that the Vistaril really helps...  PVD>  On ASA & Effient now w/o cerebral ischemic symptoms; CDoppler 4/13 w/ sl progression- continue meds...  Ven Insuffic/ Edema>  We reviewed the nec sodium restriction and continue the Lasix40...  HYPERLIPID>  Switched to Cres10 from the Simva due to Norvasc added; due for f/u FLP on ret...  DM>  Metform was held in [place] & they switched her to Glimep1mg ...  GI>  GERD, Divertics, IBS>   Stable, continue Protonix40Bid...  DJD>  Followed by DrHarrison in [place]...  Anxiety>  Off Lorazepam..[name]

## 2012-07-25 NOTE — Patient Instructions (Addendum)
Today we updated your med list in our EPIC system...    Continue your current medications the same...  Today we did your follow up blood work...    We will contact you w/ the results when avail...  Call for any questions...  Let's plan a routine check up in 4 months.Marland KitchenMarland Kitchen

## 2012-08-08 ENCOUNTER — Other Ambulatory Visit: Payer: Self-pay | Admitting: Cardiology

## 2012-08-17 ENCOUNTER — Encounter (HOSPITAL_COMMUNITY): Payer: Self-pay | Admitting: *Deleted

## 2012-08-17 ENCOUNTER — Inpatient Hospital Stay (HOSPITAL_COMMUNITY)
Admission: EM | Admit: 2012-08-17 | Discharge: 2012-08-22 | DRG: 247 | Disposition: A | Discharge status: Home Health | Payer: Medicare Other | Attending: Cardiology | Admitting: Cardiology

## 2012-08-17 ENCOUNTER — Emergency Department (HOSPITAL_COMMUNITY): Payer: Medicare Other

## 2012-08-17 DIAGNOSIS — E119 Type 2 diabetes mellitus without complications: Secondary | ICD-10-CM

## 2012-08-17 DIAGNOSIS — D649 Anemia, unspecified: Secondary | ICD-10-CM | POA: Diagnosis present

## 2012-08-17 DIAGNOSIS — E559 Vitamin D deficiency, unspecified: Secondary | ICD-10-CM | POA: Diagnosis present

## 2012-08-17 DIAGNOSIS — I6529 Occlusion and stenosis of unspecified carotid artery: Secondary | ICD-10-CM | POA: Diagnosis present

## 2012-08-17 DIAGNOSIS — N182 Chronic kidney disease, stage 2 (mild): Secondary | ICD-10-CM | POA: Diagnosis present

## 2012-08-17 DIAGNOSIS — Z79899 Other long term (current) drug therapy: Secondary | ICD-10-CM

## 2012-08-17 DIAGNOSIS — I2510 Atherosclerotic heart disease of native coronary artery without angina pectoris: Secondary | ICD-10-CM

## 2012-08-17 DIAGNOSIS — Z7982 Long term (current) use of aspirin: Secondary | ICD-10-CM

## 2012-08-17 DIAGNOSIS — I252 Old myocardial infarction: Secondary | ICD-10-CM

## 2012-08-17 DIAGNOSIS — I200 Unstable angina: Secondary | ICD-10-CM

## 2012-08-17 DIAGNOSIS — I658 Occlusion and stenosis of other precerebral arteries: Secondary | ICD-10-CM | POA: Diagnosis present

## 2012-08-17 DIAGNOSIS — E669 Obesity, unspecified: Secondary | ICD-10-CM | POA: Diagnosis present

## 2012-08-17 DIAGNOSIS — M1990 Unspecified osteoarthritis, unspecified site: Secondary | ICD-10-CM | POA: Diagnosis present

## 2012-08-17 DIAGNOSIS — Y849 Medical procedure, unspecified as the cause of abnormal reaction of the patient, or of later complication, without mention of misadventure at the time of the procedure: Secondary | ICD-10-CM | POA: Diagnosis present

## 2012-08-17 DIAGNOSIS — F411 Generalized anxiety disorder: Secondary | ICD-10-CM | POA: Diagnosis present

## 2012-08-17 DIAGNOSIS — I872 Venous insufficiency (chronic) (peripheral): Secondary | ICD-10-CM | POA: Diagnosis present

## 2012-08-17 DIAGNOSIS — J45909 Unspecified asthma, uncomplicated: Secondary | ICD-10-CM | POA: Diagnosis present

## 2012-08-17 DIAGNOSIS — I4891 Unspecified atrial fibrillation: Secondary | ICD-10-CM | POA: Diagnosis not present

## 2012-08-17 DIAGNOSIS — I10 Essential (primary) hypertension: Secondary | ICD-10-CM

## 2012-08-17 DIAGNOSIS — I214 Non-ST elevation (NSTEMI) myocardial infarction: Secondary | ICD-10-CM | POA: Diagnosis present

## 2012-08-17 DIAGNOSIS — I499 Cardiac arrhythmia, unspecified: Secondary | ICD-10-CM | POA: Diagnosis present

## 2012-08-17 DIAGNOSIS — K219 Gastro-esophageal reflux disease without esophagitis: Secondary | ICD-10-CM | POA: Diagnosis present

## 2012-08-17 DIAGNOSIS — R0902 Hypoxemia: Secondary | ICD-10-CM | POA: Diagnosis not present

## 2012-08-17 DIAGNOSIS — I7090 Unspecified atherosclerosis: Secondary | ICD-10-CM

## 2012-08-17 DIAGNOSIS — IMO0001 Reserved for inherently not codable concepts without codable children: Secondary | ICD-10-CM | POA: Diagnosis present

## 2012-08-17 DIAGNOSIS — T82897A Other specified complication of cardiac prosthetic devices, implants and grafts, initial encounter: Secondary | ICD-10-CM | POA: Diagnosis present

## 2012-08-17 DIAGNOSIS — I25810 Atherosclerosis of coronary artery bypass graft(s) without angina pectoris: Secondary | ICD-10-CM | POA: Diagnosis present

## 2012-08-17 DIAGNOSIS — E785 Hyperlipidemia, unspecified: Secondary | ICD-10-CM | POA: Diagnosis present

## 2012-08-17 HISTORY — DX: Dependence on supplemental oxygen: Z99.81

## 2012-08-17 HISTORY — DX: Unspecified atrial fibrillation: I48.91

## 2012-08-17 HISTORY — DX: Chronic kidney disease, stage 2 (mild): N18.2

## 2012-08-17 HISTORY — DX: Thrombocytopenia, unspecified: D69.6

## 2012-08-17 LAB — BASIC METABOLIC PANEL
BUN: 27 mg/dL — ABNORMAL HIGH (ref 6–23)
Chloride: 96 mEq/L (ref 96–112)
Creatinine, Ser: 1.22 mg/dL — ABNORMAL HIGH (ref 0.50–1.10)
GFR calc Af Amer: 45 mL/min — ABNORMAL LOW (ref >90)

## 2012-08-17 LAB — TROPONIN I
Troponin I: 0.69 ng/mL (ref <0.30)
Troponin I: 0.82 ng/mL (ref <0.30)

## 2012-08-17 LAB — CBC WITH DIFFERENTIAL/PLATELET
Basophils Absolute: 0 10*3/uL (ref 0.0–0.1)
Basophils Relative: 0 % (ref 0–1)
Eosinophils Relative: 3 % (ref 0–5)
HCT: 32.4 % — ABNORMAL LOW (ref 36.0–46.0)
MCHC: 34 g/dL (ref 30.0–36.0)
Monocytes Absolute: 0.4 10*3/uL (ref 0.1–1.0)
Neutro Abs: 7.2 10*3/uL (ref 1.7–7.7)
RDW: 14.4 % (ref 11.5–15.5)

## 2012-08-17 LAB — PROTIME-INR: Prothrombin Time: 14 seconds (ref 11.6–15.2)

## 2012-08-17 LAB — MRSA PCR SCREENING: MRSA by PCR: NEGATIVE

## 2012-08-17 MED ORDER — AMLODIPINE BESYLATE 5 MG PO TABS
5.0000 mg | ORAL_TABLET | Freq: Every day | ORAL | Status: DC
  Administered 2012-08-17: 5 mg via ORAL
  Filled 2012-08-17 (×2): qty 1

## 2012-08-17 MED ORDER — HEPARIN BOLUS VIA INFUSION
4000.0000 [IU] | Freq: Once | INTRAVENOUS | Status: AC
  Administered 2012-08-17: 4000 [IU] via INTRAVENOUS

## 2012-08-17 MED ORDER — NITROGLYCERIN IN D5W 200-5 MCG/ML-% IV SOLN
5.0000 ug/min | INTRAVENOUS | Status: DC
  Administered 2012-08-17 – 2012-08-18 (×3): 5 ug/min via INTRAVENOUS
  Filled 2012-08-17 (×2): qty 250

## 2012-08-17 MED ORDER — NITROGLYCERIN 0.4 MG SL SUBL: 0.4000 mg | SUBLINGUAL_TABLET | SUBLINGUAL | Status: DC | PRN

## 2012-08-17 MED ORDER — ACETAMINOPHEN 500 MG PO TABS
500.0000 mg | ORAL_TABLET | Freq: Four times a day (QID) | ORAL | Status: DC | PRN
  Filled 2012-08-17: qty 1

## 2012-08-17 MED ORDER — SODIUM CHLORIDE 0.9 % IJ SOLN
3.0000 mL | Freq: Two times a day (BID) | INTRAMUSCULAR | Status: DC
  Administered 2012-08-17 – 2012-08-18 (×2): 3 mL via INTRAVENOUS

## 2012-08-17 MED ORDER — NON FORMULARY: 10.0000 mg | Freq: Every day | Status: DC

## 2012-08-17 MED ORDER — INSULIN ASPART 100 UNIT/ML ~~LOC~~ SOLN
0.0000 [IU] | Freq: Three times a day (TID) | SUBCUTANEOUS | Status: DC
  Administered 2012-08-17: 2 [IU] via SUBCUTANEOUS
  Administered 2012-08-20: 3 [IU] via SUBCUTANEOUS
  Administered 2012-08-21: 2 [IU] via SUBCUTANEOUS

## 2012-08-17 MED ORDER — SODIUM CHLORIDE 0.9 % IV SOLN
1.0000 mL/kg/h | INTRAVENOUS | Status: DC
  Administered 2012-08-18 (×2): 1 mL/kg/h via INTRAVENOUS

## 2012-08-17 MED ORDER — SODIUM CHLORIDE 0.9 % IV SOLN
250.0000 mL | INTRAVENOUS | Status: DC | PRN
  Administered 2012-08-17: 16:00:00 via INTRAVENOUS

## 2012-08-17 MED ORDER — IRBESARTAN 150 MG PO TABS
150.0000 mg | ORAL_TABLET | Freq: Every day | ORAL | Status: DC
  Administered 2012-08-17: 150 mg via ORAL
  Filled 2012-08-17 (×2): qty 1

## 2012-08-17 MED ORDER — HEPARIN (PORCINE) IN NACL 100-0.45 UNIT/ML-% IJ SOLN
900.0000 [IU]/h | INTRAMUSCULAR | Status: DC
  Administered 2012-08-17 (×2): 900 [IU]/h via INTRAVENOUS
  Filled 2012-08-17 (×3): qty 250

## 2012-08-17 MED ORDER — ACETAMINOPHEN 325 MG PO TABS
650.0000 mg | ORAL_TABLET | ORAL | Status: DC | PRN
  Administered 2012-08-17 – 2012-08-20 (×3): 650 mg via ORAL
  Filled 2012-08-17 (×3): qty 2

## 2012-08-17 MED ORDER — PANTOPRAZOLE SODIUM 40 MG PO TBEC
40.0000 mg | DELAYED_RELEASE_TABLET | Freq: Two times a day (BID) | ORAL | Status: DC
  Administered 2012-08-17 – 2012-08-22 (×11): 40 mg via ORAL
  Filled 2012-08-17 (×11): qty 1

## 2012-08-17 MED ORDER — NITROGLYCERIN 0.4 MG SL SUBL
0.4000 mg | SUBLINGUAL_TABLET | SUBLINGUAL | Status: AC | PRN
  Administered 2012-08-17 (×3): 0.4 mg via SUBLINGUAL
  Filled 2012-08-17: qty 25

## 2012-08-17 MED ORDER — PRASUGREL HCL 5 MG PO TABS
5.0000 mg | ORAL_TABLET | Freq: Every day | ORAL | Status: DC
  Administered 2012-08-17 – 2012-08-19 (×3): 5 mg via ORAL
  Filled 2012-08-17 (×4): qty 1

## 2012-08-17 MED ORDER — METOPROLOL TARTRATE 50 MG PO TABS
75.0000 mg | ORAL_TABLET | Freq: Two times a day (BID) | ORAL | Status: DC
  Administered 2012-08-17 (×2): 75 mg via ORAL
  Filled 2012-08-17 (×4): qty 1

## 2012-08-17 MED ORDER — AMITRIPTYLINE HCL 10 MG PO TABS
10.0000 mg | ORAL_TABLET | Freq: Every day | ORAL | Status: DC
  Administered 2012-08-17 – 2012-08-21 (×5): 10 mg via ORAL
  Filled 2012-08-17 (×7): qty 1

## 2012-08-17 MED ORDER — ONDANSETRON HCL 4 MG/2ML IJ SOLN
4.0000 mg | Freq: Four times a day (QID) | INTRAMUSCULAR | Status: DC | PRN
  Administered 2012-08-18 – 2012-08-22 (×2): 4 mg via INTRAVENOUS
  Filled 2012-08-17 (×2): qty 2

## 2012-08-17 MED ORDER — ASPIRIN EC 81 MG PO TBEC
81.0000 mg | DELAYED_RELEASE_TABLET | Freq: Every morning | ORAL | Status: DC
  Administered 2012-08-18 – 2012-08-22 (×5): 81 mg via ORAL
  Filled 2012-08-17 (×6): qty 1

## 2012-08-17 MED ORDER — ROSUVASTATIN CALCIUM 10 MG PO TABS
10.0000 mg | ORAL_TABLET | Freq: Every day | ORAL | Status: DC
  Administered 2012-08-17 – 2012-08-21 (×5): 10 mg via ORAL
  Filled 2012-08-17 (×7): qty 1

## 2012-08-17 MED ORDER — SODIUM CHLORIDE 0.9 % IJ SOLN: 3.0000 mL | INTRAMUSCULAR | Status: DC | PRN

## 2012-08-17 MED ORDER — BIOTENE DRY MOUTH MT LIQD
15.0000 mL | Freq: Two times a day (BID) | OROMUCOSAL | Status: DC
  Administered 2012-08-17 – 2012-08-19 (×4): 15 mL via OROMUCOSAL

## 2012-08-17 MED ORDER — HYDROXYZINE HCL 25 MG PO TABS
25.0000 mg | ORAL_TABLET | Freq: Four times a day (QID) | ORAL | Status: DC | PRN
  Filled 2012-08-17: qty 1

## 2012-08-17 MED ORDER — GLIMEPIRIDE 1 MG PO TABS
1.0000 mg | ORAL_TABLET | Freq: Every day | ORAL | Status: DC
  Administered 2012-08-19 – 2012-08-22 (×4): 1 mg via ORAL
  Filled 2012-08-17 (×5): qty 1

## 2012-08-17 MED ORDER — VITAMIN D3 25 MCG (1000 UNIT) PO TABS
1000.0000 [IU] | ORAL_TABLET | Freq: Every day | ORAL | Status: DC
  Administered 2012-08-17 – 2012-08-22 (×6): 1000 [IU] via ORAL
  Filled 2012-08-17 (×6): qty 1

## 2012-08-17 MED ORDER — FERROUS SULFATE 324 (65 FE) MG PO TBEC
1.0000 | DELAYED_RELEASE_TABLET | Freq: Two times a day (BID) | ORAL | Status: DC
  Administered 2012-08-19 – 2012-08-22 (×6): 325 mg via ORAL
  Filled 2012-08-17 (×13): qty 1

## 2012-08-17 NOTE — H&P (Signed)
Patient ID: Alexandra [name] MRN: [id number], DOB/AGE: 1924/02/13   Admit date: 08/17/2012  Primary Physician: Alexandra Mcalpine, MD Primary Cardiologist: R. Dietrich Pates, MD  Pt. Profile:  79 yo female with cc of CP.  Problem List  Past Medical History  Diagnosis Date  . Asthmatic bronchitis   . Sarcoidosis     a. 1990s of lung tx with prednisone temporarily.  [name] HTN (hypertension)   . PVD (peripheral vascular disease)     a. Carotid dopp 07/2011: mild-mod atherosclerotic plaque bilaterally, 50-69%;  b. 06/2012 Carotid U/S: < 50% bilat ICA stenosis.  . Hyperlipemia   . DM (diabetes mellitus)   . Obesity   . GERD (gastroesophageal reflux disease)   . Diverticulosis of colon   . IBS (irritable bowel syndrome)   . DJD (degenerative joint disease)     Hip and knee pain; hip bursitis; patellar subluxation  . Vitamin D deficiency disease   . Fibromyalgia   . Anxiety     a. Guaiac neg 03/2012.  . Intermittent vertigo   . Anemia   . Venous insufficiency   . PSVT (paroxysmal supraventricular tachycardia)     a. Prior hx of palpitations - developed SVT during stress Myoview 09/2011. b. Noted on tele 03/2012.  . Valvular heart disease     a. moderately thickened/mildly calcified aortic valve, mod MR 03/20/12  . CKD (chronic kidney disease), stage II   . PAC (premature atrial contraction)   . CAD (coronary artery disease) 03/2011    a. NSTEMI 03/2011 complicated by pulm edema s/p DES to subtotal LAD. b. [place] s/p PTCA/cutting balloon to prox LAD for ISR 12/01/11 (med rx for RCA/Cx dz).  c. NSTEMI 03/2012 s/p cutting angioplasty of ISR of prox LAD 03/21/12 (PLAVIX NONRESPONDER).    Past Surgical History  Procedure Laterality Date  . Cholecystectomy    . Tonsillectomy    . Hernia repair    . Cataract extraction    . Coronary angioplasty with stent placement  03/21/2012    Allergies  Allergies  Allergen Reactions  . Codeine Other (See Comments)    Reaction: severe GI pain  . Diazepam  Other (See Comments)    REACTION: causes pt unable to wake up after taking  . Lipitor (Atorvastatin Calcium) Other (See Comments)    Reaction: causes vertigo  . Penicillins Other (See Comments)    Reaction: unknown (childhood )  . Sulfonamide Derivatives Other (See Comments)    Reaction: unknown (childhood)  . Mango Flavor Rash   HPI  79 year old female with the above problem list admitted to [facility] from [facility] for c/o chest pain. She reports that her chest pain began this morning around 230 AM. She describes her pain as a 10/10 continuous ache that radiates to bilateral arms causing numbness/tingling. During this episode, she reported that she felt very nauseas and dry heaved x1. She denied vomiting, diaphoresis, palpitations, lightheadedness, SOB and DOE.  Nothing made the pain worse. She took SL NTG x3 and 81mg  ASA x2 without relief and then proceeded to call an ambulance. Upon arrival to [facility], she received, 3 additional SL NTG and 2 more 81 ASA. She states that she felt some relief however she did not experience complete relief until a NTG gtt was initiated. She reports that she has been experiencing intermittent chest pain for the past week that has been relieved by Nitro. The pain she experienced today was noted to be similar to the pain she experienced with her  previous MI's.  She currently denies chest pain, SOB, DOE, lightheadedness, palpitations, and n/v.   Home Medications  Prior to Admission medications   Medication Sig Start Date End Date Taking? Authorizing Provider  acetaminophen (TYLENOL) 500 MG tablet Take 500 mg by mouth every 6 (six) hours as needed.      Historical Provider, MD  amitriptyline (ELAVIL) 10 MG tablet Take 1 tablet (10 mg total) by mouth at bedtime. 01/13/12 01/12/13  Alexandra Mcalpine, MD  amLODipine (NORVASC) 2.5 MG tablet Take 2.5 mg by mouth at bedtime. 07/21/11 07/25/12  Kathlen Brunswick, MD  amLODipine (NORVASC) 2.5 MG tablet TAKE 1 TABLET ONCE DAILY. 08/08/12   Kathlen Brunswick, MD  ascorbic acid (VITAMIN C) 500 MG tablet Take 500 mg by mouth 2 (two) times daily. Twice daily with iron tablet    Historical Provider, MD  aspirin EC 81 MG tablet Take 81 mg by mouth every morning.     Historical Provider, MD  cholecalciferol (VITAMIN D) 1000 UNITS tablet Take 1,000 Units by mouth daily.    Historical Provider, MD  econazole nitrate 1 % cream Apply 1 application topically daily as needed. FOR FEET IRRITATION    Historical Provider, MD  EFFIENT 5 MG TABS TAKE (1) TABLET BY MOUTH ONCE DAILY. 04/21/12   Alexandra Rotunda, MD  ferrous sulfate 324 (65 FE) MG TBEC Take 1 tablet by mouth 2 (two) times daily.     Historical Provider, MD  furosemide (LASIX) 40 MG tablet Take 40 mg by mouth daily. 11/10/11   Alexandra Mcalpine, MD  furosemide (LASIX) 40 MG tablet TAKE (1) TABLET BY MOUTH ONCE DAILY. 06/18/12   Alexandra Mcalpine, MD  glimepiride (AMARYL) 1 MG tablet Take 1 tablet (1 mg total) by mouth daily with lunch. 03/30/12   Alexandra S Parrett, NP  glucose blood test strip 1 each by Other route as needed. Use as instructed     Historical Provider, MD  hydrOXYzine (ATARAX/VISTARIL) 25 MG tablet Take 1 tablet (25 mg total) by mouth every 6 (six) hours as needed. For itching or anxiety 06/03/12   Alexandra Mcalpine, MD  irbesartan (AVAPRO) 150 MG tablet Take 1 tablet (150 mg total) by mouth at bedtime. 05/16/12   Alexandra Mcalpine, MD  metoprolol (LOPRESSOR) 50 MG tablet Take  1 1/2 tablets (75 mg ) twice daily    Alexandra Gross, NP  nitroGLYCERIN (NITROSTAT) 0.4 MG SL tablet Place 1 tablet (0.4 mg total) under the tongue every 5 (five) minutes as needed for chest pain (up to 3 doses). 03/24/12 03/24/13  Alexandra N Dunn, PA-C  pantoprazole (PROTONIX) 40 MG tablet Take 1 tablet (40 mg total) by mouth 2 (two) times daily. 03/24/12 03/24/13  Alexandra N Dunn, PA-C  rosuvastatin (CRESTOR) 10 MG tablet Take 1 tablet (10 mg total) by mouth daily. 03/24/12   Alexandra N Dunn, PA-C  vitamin B-12 (CYANOCOBALAMIN) 1000  MCG tablet Take 1,000 mcg by mouth every morning.    Historical Provider, MD   Family History  Family History  Problem Relation Age of Onset  . Heart failure Father   . Leukemia Mother    Social History  History   Social History  . Marital Status: Widowed    Spouse Name: N/A    Number of Children: 1  . Years of Education: N/A   Occupational History  . Not on file.   Social History Main Topics  . Smoking status: Never Smoker   .  Smokeless tobacco: Never Used  . Alcohol Use: Yes     Comment: social  . Drug Use: No  . Sexually Active: No   Other Topics Concern  . Not on file   Social History Narrative   Pt is only child. Husband Alexandra [name] passed away in 2002/10/18   Pt has 1 son in [place](his wife is MD doing [employer] Research )     Review of Systems General:  No chills, fever, night sweats or weight changes.  Cardiovascular:  ++ Chest pain, dyspnea on exertion, edema, orthopnea, palpitations, paroxysmal nocturnal dyspnea. Dermatological: No rash, lesions/masses Respiratory: No cough, dyspnea Urologic: No hematuria, dysuria Abdominal:   ++ Nausea, No vomiting, diarrhea, bright red blood per rectum, melena, or hematemesis Neurologic:  No visual changes, wkns, changes in mental status. All other systems reviewed and are otherwise negative except as noted above.  Physical Exam  Blood pressure 120/54, pulse 78, temperature 97.6 F (36.4 C), temperature source Oral, resp. rate 16, height 5\' 6"  (1.676 m), weight 185 lb 13.6 oz (84.3 kg), SpO2 100.00%.  General: Pleasant, NAD Psych: Normal affect. Neuro: Alert and oriented X 3. Moves all extremities spontaneously. HEENT: Normal  Neck: Supple without JVD.  Radiated murmur bilat. Lungs:  Resp regular and unlabored, CTA. Heart: Irregular, no s3, s4,  2/6 murmur heard throughout - radiates up to neck bilat. Abdomen: Soft, non-tender, non-distended, BS + x 4.  Extremities: No clubbing, cyanosis or edema. DP/PT +1 and equal bilaterally,  Radials 2+ and equal bilaterally.   Labs   Recent Labs  08/17/12 0717  TROPONINI <0.30   Lab Results  Component Value Date   WBC 8.6 08/17/2012   HGB 11.0* 08/17/2012   HCT 32.4* 08/17/2012   MCV 86.4 08/17/2012   PLT 130* 08/17/2012    Recent Labs Lab 08/17/12 0717  NA 136  K 3.9  CL 96  CO2 29  BUN 27*  CREATININE 1.22*  CALCIUM 9.7  GLUCOSE 155*   Lab Results  Component Value Date   CHOL 116 03/21/2012   HDL 46 03/21/2012   LDLCALC 51 03/21/2012   TRIG 97 03/21/2012   Radiology/Studies  Dg Chest Portable 1 View  08/17/2012  *RADIOLOGY REPORT*  Clinical Data: Chest pain.  PORTABLE CHEST - 1 VIEW  Comparison: Chest x-ray 03/23/2011.  Findings: Lung volumes appear slightly low.  There is diffuse interstitial prominence throughout the lungs bilaterally, progressively increased over numerous prior examinations.  The overall appearance is only slightly worsened when compared to the most recent prior study from 03/28/2012.  No focal confluent airspace consolidation.  No pleural effusions.  No definite pulmonary edema (although this is difficult to entirely exclude given the chronic interstitial changes).  Heart size is mildly enlarged. The patient is rotated to the right on today's exam, resulting in distortion of the mediastinal contours and reduced diagnostic sensitivity and specificity for mediastinal pathology. Atherosclerosis in the thoracic aorta.  IMPRESSION: 1.  Unusual appearance of the lungs, as above, progressively worsened over numerous prior examinations.  The overall appearance is favored to represent an underlying interstitial lung disease. This could be better evaluated with follow-up non emergent high- resolution chest CT if clinically indicated. 2.  Mild cardiomegaly. 3.  Atherosclerosis.   Original Report Authenticated By: Trudie Reed, M.D.     ECG  Afib, 77 BPM  ASSESSMENT AND PLAN  1. Unstable Angina/CAD: Currently denies CP. Heparin and Nitro gtts  infusing. Continue BB, ASA, CCB, and Statin therapies. Plan for cath in  the morning.  Will hydrate o/n given her hx of CKD.   2. Wandering Atrial Pacemaker:  Pt has irregular heart rhythm however atrial activity with varied P morphology.  WAP.  Continue to follow.  If she has recurrent tachycardia, will ask for 12 lead rhythm strip.   4. HTN: Appears well controlled at this time. Continue ARB, BB, and CCB.   5: CKD II: BUN/Creatinine down from previous admissions. Pre-hydrate tonight for planned Cardiac Cath tomorrow 08/18/12. Continue to monitor.   6. DM: Add ssi.  Cont amaryl.  7. HL: LDL 51 in October.  Cont crestor.  8. Abnl CXR:  H/o sarcoid followed by pulm.  Defer further eval/CT to Dr. Kriste Basque in outpt setting.  Signed, Nicolasa Ducking, NP 08/17/2012, 12:22 PM  I have personally seen and examined this patient with Ward Givens, NP. I agree with the assessment and plan as outlined above. Her presentation is c/w unstable angina. She has complex CAD with 80% serial mid RCA stenoses, 70% ostial Circumflex stenosis and severe in stent restenosis LAD stent by cath October 2013. Her LAD stent is an everolimus coated (promus) stent. The restenosis in this stent has been treated in July 2013 and October 2013 with cutting balloon angioplasty alone since we do not currently have Resolute (zotarolimus) stents available in our hospital. During the last PCI in October 2013, we elected to not place another Promus stent as this would most likely not be beneficial on top of another everolimus coated stent. This is outlined in my cath report in October 2013. She will need repeat cath tomorrow. If there is progression of disease, will get a surgical consultation for CABG. Given her advanced age, she may not be a surgical candidate. In that situation, we would have to consider ordering Zotarolimus coated stents for potential multi-vessel PCI. Continue heparin and NTG drips tonight. Currently pain free.    Larkyn Greenberger 3:49 PM 08/17/2012

## 2012-08-17 NOTE — ED Notes (Signed)
Called [phone] to give report. Patient just arrived per Secretary. Josh, RN to call with questions if needed. My name and number provided.

## 2012-08-17 NOTE — ED Notes (Signed)
[facility] at bedside 

## 2012-08-17 NOTE — ED Notes (Signed)
Nitroglycerin drip started due to patients c/o 10/10 chest pain.  [facility] aware.

## 2012-08-17 NOTE — ED Notes (Signed)
Chest pain began at 0230. Took 3 NTG SL. Pt states pain eased with nitro but has returned. States pain to chest, throat, and bilateral arms.

## 2012-08-17 NOTE — Progress Notes (Signed)
Pt arrived at approx 1050 to SDU via [facility] from [facility]. Pt on heparin and nitro gtt. Pt denies pain or discomfort. Pt oriented to unit, assessment as documented. Trish, [place] cardmaster notified of pt's arrival. Stressed importance of notifying staff of chest pain or needing assistance by using call light. Reviewed NPO and bedrest status. EKG shows a-fib. Awaiting MD arrival for orders. Monitoring closely. All questions answered.

## 2012-08-17 NOTE — Care Management Note (Addendum)
    Page 1 of 2   08/22/2012     3:26:27 PM   CARE MANAGEMENT NOTE 08/22/2012  Patient:  Alexandra [name], Alexandra [name]   Account Number:  [id number]  Date Initiated:  08/17/2012  Documentation initiated by:  Junius Creamer  Subjective/Objective Assessment:   adm w  angina     Action/Plan:   lives alone, pcp dr Lorin Picket nadel   Anticipated DC Date:  08/21/2012   Anticipated DC Plan:  HOME/SELF CARE      DC Planning Services  CM consult  Medication Assistance      [facility] Choice  HOME HEALTH   Choice offered to / List presented to:  C-1 Patient   DME arranged  OXYGEN      DME agency  [facility]     [facility] arranged  HH-1 RN  HH-7 RESPIRATORY THERAPY      HH agency  [facility]   Status of service:  Completed, signed off Medicare Important Message given?   (If response is "NO", the following Medicare IM given date fields will be blank) Date Medicare IM given:   Date Additional Medicare IM given:    Discharge Disposition:  HOME W HOME HEALTH SERVICES  Per UR Regulation:  Reviewed for med. necessity/level of care/duration of stay  If discussed at Long Length of Stay Meetings, dates discussed:    Comments:  08/22/12  Rosalita Chessman [phone] PT FOR DISCHARGE HOME TODAY; WILL NEED HOME OXYGEN SET UP. REFERRAL TO [facility] FOR [facility] 02.  MD ORDERED FOLLOW UP WITH HHRN; REFERRAL TO [facility], PER PT CHOICE.  START OF CARE 24-48H POST DC DATE.  PORTABLE O2 TANK DELIVERED TO PT ROOM PRIOR TO DC.  08/20/2012 1230 NCM spoke to pt and states she has used her 30 day free trial of Effient. States they had started her back on Plavix. States she paid $75 for Plavix. She is able to afford Plavix. Daryl Eastern RN CCM Case Mgmt phone [phone]  08/19/12 @ 1045.Marland KitchenMarland KitchenOletta Cohn, RN, [place] Major [phone] Spoke with pt regarding presciption coverage.  Pt states that she has coverage through [employer] and gave me number for her pharmacy to verify [facility] in [place]).  Pharm  Tech states that pt has Medicare Part D benefits and co-pay for Effient is $107.  Pt aware and fine with charges as she was used to paying this amount.  3/19 1123a debbie dowell rn,bsn

## 2012-08-17 NOTE — Progress Notes (Signed)
ANTICOAGULATION CONSULT NOTE - Follow Up Consult  Pharmacy Consult for heparin Indication: chest pain/ACS  Patient Measurements: Height: 5\' 6"  (167.6 cm) Weight: 185 lb 13.6 oz (84.3 kg) IBW/kg (Calculated) : 59.3 Heparin Dosing Weight: 77kg  Vital Signs: Temp: 97.3 F (36.3 C) (03/19 1700) Temp src: Oral (03/19 1700) BP: 111/65 mmHg (03/19 1600) Pulse Rate: 69 (03/19 1600)  Labs:  Recent Labs  08/17/12 0717 08/17/12 0801 08/17/12 1456 08/17/12 1811  HGB 11.0*  --   --   --   HCT 32.4*  --   --   --   PLT 130*  --   --   --   APTT  --  35  --   --   LABPROT  --  14.0  --   --   INR  --  1.09  --   --   HEPARINUNFRC  --   --   --  0.57  CREATININE 1.22*  --   --   --   TROPONINI <0.30  --  0.69*  --     Estimated Creatinine Clearance: 36.2 ml/min (by C-G formula based on Cr of 1.22).   Medications:  Heparin infusion at 900 units/hr  Assessment: 79 year old female transferred from [facility] for chest pain/nstemi currently at goal on IV heparin drip with plans for cath in am. No bleeding complications noted. Will continue at same rate.  Goal of Therapy:  Heparin level 0.3-0.7 units/ml Monitor platelets by anticoagulation protocol: Yes   Plan:  Continue heparin at 900 units/hr Daily HL/cbc  Severiano Gilbert 08/17/2012,6:58 PM

## 2012-08-17 NOTE — ED Notes (Signed)
Attempted to call report to [phone]. RN not available. Will attempt to call back.

## 2012-08-17 NOTE — ED Notes (Signed)
Two staff assist to bedside commode. Patient noted to become short of breath and heart rate increased to 120s with activity. Patient returned to bed, side rails up x 2.

## 2012-08-17 NOTE — Progress Notes (Signed)
Pt appears to switch between NSR and A-fib. HR remains 60s-70s. Ward Givens NP notified. Admit orders received.

## 2012-08-17 NOTE — Plan of Care (Signed)
Problem: Consults Goal: Cardiac Cath Patient Education (See Patient Education module for education specifics.) Outcome: Completed/Met Date Met:  08/17/12 Pt declined educational video.

## 2012-08-17 NOTE — ED Provider Notes (Signed)
History     This chart was scribed for Joya Gaskins, MD, MD by Smitty Pluck, ED Scribe. The patient was seen in room APA06/APA06 and the patient's care was started at 7:19 AM.   CSN: [id number]  Arrival date & time 08/17/12  0703      Chief Complaint  Patient presents with  . Chest Pain    Patient is a 79 y.o. female presenting with chest pain. The history is provided by the patient and medical records. No language interpreter was used.  Chest Pain Pain location:  Unable to specify Pain radiates to:  L arm and R arm Pain radiates to the back: no   Pain severity:  Moderate Onset quality:  Sudden Timing:  Constant Progression:  Unchanged Chronicity:  Recurrent Relieved by:  Nothing Ineffective treatments:  Aspirin and nitroglycerin Associated symptoms: fatigue and nausea   Associated symptoms: no back pain, no shortness of breath and not vomiting    Alexandra [name] is a 79 y.o. female with hx of HTN, DM, CAD and MI (2012 and 2013) who presents to the Emergency Department complaining of constant, moderate chest pain radiating to bilateral arms onset 2:30 AM today. Pt reports current symptoms are similar to past MI symptoms. She reports taking 4 asa and 3 nitroglycerin today with minor relief. She reports having nausea. Pt denies LOC, fever, chills, vomiting, diarrhea, weakness, cough, and any other pain.   PCP is Dr.Nadel Cardiologist is Dr. Dietrich Pates   Past Medical History  Diagnosis Date  . Asthmatic bronchitis   . Sarcoidosis     a. 1990s of lung tx with prednisone temporarily.  [name] HTN (hypertension)   . PVD (peripheral vascular disease)     a. Carotid dopp 07/2011: mild-mod atherosclerotic plaque bilaterally, 50-69% .  [name] Hyperlipemia   . DM (diabetes mellitus)   . Obesity   . GERD (gastroesophageal reflux disease)   . Diverticulosis of colon   . IBS (irritable bowel syndrome)   . DJD (degenerative joint disease)     Hip and knee pain; hip bursitis; patellar  subluxation  . Vitamin D deficiency disease   . Fibromyalgia   . Anxiety     a. Guaiac neg 03/2012.  . Intermittent vertigo   . Anemia   . Venous insufficiency   . PSVT (paroxysmal supraventricular tachycardia)     a. Prior hx of palpitations - developed SVT during stress Myoview 09/2011. b. Noted on tele 03/2012.  . Valvular heart disease     a. moderately thickened/mildly calcified aortic valve, mod MR 03/20/12  . Chronic kidney disease     Stage II  . PAC (premature atrial contraction)   . CAD (coronary artery disease) 03/2011    a. NSTEMI 03/2011 complicated by pulm edema s/p DES to subtotal LAD. b. [place] s/p PTCA/cutting balloon to prox LAD for ISR 12/01/11 (med rx for RCA/Cx dz).  c. NSTEMI 03/2012 s/p cutting angioplasty of ISR of prox LAD 03/21/12 (PLAVIX NONRESPONDER).    Past Surgical History  Procedure Laterality Date  . Cholecystectomy    . Tonsillectomy    . Hernia repair    . Cataract extraction    . Coronary angioplasty with stent placement  03/21/2012    Family History  Problem Relation Age of Onset  . Heart failure Father   . Leukemia Mother     History  Substance Use Topics  . Smoking status: Never Smoker   . Smokeless tobacco: Never Used  . Alcohol Use: Yes  Comment: social    OB History   Grav Para Term Preterm Abortions TAB SAB Ect Mult Living                  Review of Systems  Constitutional: Positive for fatigue.  Respiratory: Negative for shortness of breath.   Cardiovascular: Positive for chest pain.  Gastrointestinal: Positive for nausea. Negative for vomiting and blood in stool.  Musculoskeletal: Negative for back pain.  Neurological: Negative for syncope.  Psychiatric/Behavioral: Negative for agitation.  All other systems reviewed and are negative.    Allergies  Codeine; Diazepam; Lipitor; Penicillins; Sulfonamide derivatives; and Mango flavor  Home Medications   Current Outpatient Rx  Name  Route  Sig  Dispense  Refill  .  acetaminophen (TYLENOL) 500 MG tablet   Oral   Take 500 mg by mouth every 6 (six) hours as needed.           [name] amitriptyline (ELAVIL) 10 MG tablet   Oral   Take 1 tablet (10 mg total) by mouth at bedtime.   30 tablet   11   . EXPIRED: amLODipine (NORVASC) 2.5 MG tablet   Oral   Take 2.5 mg by mouth at bedtime.         [name] amLODipine (NORVASC) 2.5 MG tablet      TAKE 1 TABLET ONCE DAILY.   30 tablet   1     Patient needs to call office and schedule an appoi ...   . ascorbic acid (VITAMIN C) 500 MG tablet   Oral   Take 500 mg by mouth 2 (two) times daily. Twice daily with iron tablet         . aspirin EC 81 MG tablet   Oral   Take 81 mg by mouth every morning.          . cholecalciferol (VITAMIN D) 1000 UNITS tablet   Oral   Take 1,000 Units by mouth daily.         [name] econazole nitrate 1 % cream   Topical   Apply 1 application topically daily as needed. FOR FEET IRRITATION         . EFFIENT 5 MG TABS      TAKE (1) TABLET BY MOUTH ONCE DAILY.   30 each   12   . ferrous sulfate 324 (65 FE) MG TBEC   Oral   Take 1 tablet by mouth 2 (two) times daily.          . furosemide (LASIX) 40 MG tablet   Oral   Take 40 mg by mouth daily.         . furosemide (LASIX) 40 MG tablet      TAKE (1) TABLET BY MOUTH ONCE DAILY.   30 tablet   6   . glimepiride (AMARYL) 1 MG tablet   Oral   Take 1 tablet (1 mg total) by mouth daily with lunch.   30 tablet   5   . glucose blood test strip   Other   1 each by Other route as needed. Use as instructed          . hydrOXYzine (ATARAX/VISTARIL) 25 MG tablet   Oral   Take 1 tablet (25 mg total) by mouth every 6 (six) hours as needed. For itching or anxiety   30 tablet   5   . irbesartan (AVAPRO) 150 MG tablet   Oral   Take 1 tablet (150 mg total) by mouth at  bedtime.   30 tablet   6   . metoprolol (LOPRESSOR) 50 MG tablet      Take  1 1/2 tablets (75 mg ) twice daily         . nitroGLYCERIN  (NITROSTAT) 0.4 MG SL tablet   Sublingual   Place 1 tablet (0.4 mg total) under the tongue every 5 (five) minutes as needed for chest pain (up to 3 doses).   25 tablet   4     For chest pain   . pantoprazole (PROTONIX) 40 MG tablet   Oral   Take 1 tablet (40 mg total) by mouth 2 (two) times daily.   60 tablet   6   . rosuvastatin (CRESTOR) 10 MG tablet   Oral   Take 1 tablet (10 mg total) by mouth daily.   30 tablet   6   . vitamin B-12 (CYANOCOBALAMIN) 1000 MCG tablet   Oral   Take 1,000 mcg by mouth every morning.           BP 134/68  Pulse 82  Temp(Src) 97.6 F (36.4 C) (Oral)  Resp 16  Ht 5\' 6"  (1.676 m)  Wt 184 lb (83.462 kg)  BMI 29.71 kg/m2  SpO2 94%  Physical Exam  Nursing note and vitals reviewed.  CONSTITUTIONAL: Well developed/well nourished, no distress noted HEAD: Normocephalic/atraumatic EYES: EOMI/PERRL ENMT: Mucous membranes moist NECK: supple no meningeal signs SPINE:entire spine nontender CV: S1/S2 noted, murmur noted LUNGS: Lungs are clear to auscultation bilaterally, no apparent distress ABDOMEN: soft, nontender, no rebound or guarding GU:no cva tenderness NEURO: Pt is awake/alert, moves all extremitiesx4, no focal arm/leg weakness noted EXTREMITIES: pulses normal, full ROM SKIN: warm, color normal PSYCH: no abnormalities of mood noted  ED Course  Procedures   CRITICAL CARE Performed by: Joya Gaskins   Total critical care time: 31  Critical care time was exclusive of separately billable procedures and treating other patients.  Critical care was necessary to treat or prevent imminent or life-threatening deterioration.  Critical care was time spent personally by me on the following activities: development of treatment plan with patient and/or surrogate as well as nursing, discussions with consultants, evaluation of patient's response to treatment, examination of patient, obtaining history from patient or surrogate, ordering  and performing treatments and interventions, ordering and review of laboratory studies, ordering and review of radiographic studies, pulse oximetry and re-evaluation of patient's condition.  DIAGNOSTIC STUDIES: Oxygen Saturation is 94% on room air, adequate by my interpretation.    COORDINATION OF CARE: 7:24 AM Discussed ED treatment with pt and pt agrees.  Pt has taken ASA today She has abnormal EKG (ST depression laterally) NTG has been ordered for her pain. Will follow closely EKG abnormal.  It does appear irregular, but suspect this may be due to PACs.   8:09 AM Spoke to dr [place] with [facility] He reviewed EKGs - he agrees that ST depression is new in this patient with known CAD He will accept in transfer to stepdown at [facility], will start heparin (pt denies GI bleed) I doubt aortic dissection or PE at this time     MDM  Nursing notes including past medical history and social history reviewed and considered in documentation xrays reviewed and considered Labs/vital reviewed and considered Previous records reviewed and considered - h/o CAD noted, currently on effient     Date: 08/17/2012 07:11am  Rate: 69  Rhythm: normal sinus rhythm  QRS Axis: normal  Intervals: normal  ST/T  Wave abnormalities: nonspecific ST changes  Conduction Disutrbances:none  Narrative Interpretation:   Old EKG Reviewed: artifact noted, difficult to compare to prior     Date: 08/17/2012 07:27AM  Rate: 74  Rhythm: indeterminate  QRS Axis: normal  Intervals: normal  ST/T Wave abnormalities: ST depressions laterally  Conduction Disutrbances:none  Narrative Interpretation:   Old EKG Reviewed: changes noted       I personally performed the services described in this documentation, which was scribed in my presence. The recorded information has been reviewed and is accurate.      Joya Gaskins, MD 08/17/12 [phone]

## 2012-08-17 NOTE — Progress Notes (Signed)
CRITICAL VALUE ALERT  Critical value received:  Troponin 0.69  Date of notification:  08/17/12  Time of notification:  1607  Critical value read back:yes  Nurse who received alert:  Riccardo Dubin RN MD notified (1st page):  Ward Givens NP  Time of first page:  1612  MD notified (2nd page):  Time of second page:  Responding MD:  Ward Givens NP  Time MD responded:  1617  No orders received. Pt on heparin and nitro gtt. Denies pain.

## 2012-08-17 NOTE — Progress Notes (Signed)
ANTICOAGULATION CONSULT NOTE - Initial Consult  Pharmacy Consult for Heparin  Indication: chest pain/ACS  Allergies  Allergen Reactions  . Codeine Other (See Comments)    Reaction: severe GI pain  . Diazepam Other (See Comments)    REACTION: causes pt unable to wake up after taking  . Lipitor (Atorvastatin Calcium) Other (See Comments)    Reaction: causes vertigo  . Penicillins Other (See Comments)    Reaction: unknown (childhood )  . Sulfonamide Derivatives Other (See Comments)    Reaction: unknown (childhood)  . Mango Flavor Rash    Patient Measurements: Height: 5\' 6"  (167.6 cm) Weight: 184 lb (83.462 kg) IBW/kg (Calculated) : 59.3 Heparin Dosing Weight: 77kg  Vital Signs: Temp: 97.6 F (36.4 C) (03/19 0708) Temp src: Oral (03/19 0708) BP: 116/61 mmHg (03/19 0800) Pulse Rate: 80 (03/19 0800)  Labs:  Recent Labs  08/17/12 0717 08/17/12 0801  HGB 11.0*  --   HCT 32.4*  --   PLT 130*  --   APTT  --  35  LABPROT  --  14.0  INR  --  1.09  CREATININE 1.22*  --   TROPONINI <0.30  --     Estimated Creatinine Clearance: 36.1 ml/min (by C-G formula based on Cr of 1.22).   Medical History: Past Medical History  Diagnosis Date  . Asthmatic bronchitis   . Sarcoidosis     a. 1990s of lung tx with prednisone temporarily.  [name] HTN (hypertension)   . PVD (peripheral vascular disease)     a. Carotid dopp 07/2011: mild-mod atherosclerotic plaque bilaterally, 50-69% .  [name] Hyperlipemia   . DM (diabetes mellitus)   . Obesity   . GERD (gastroesophageal reflux disease)   . Diverticulosis of colon   . IBS (irritable bowel syndrome)   . DJD (degenerative joint disease)     Hip and knee pain; hip bursitis; patellar subluxation  . Vitamin D deficiency disease   . Fibromyalgia   . Anxiety     a. Guaiac neg 03/2012.  . Intermittent vertigo   . Anemia   . Venous insufficiency   . PSVT (paroxysmal supraventricular tachycardia)     a. Prior hx of palpitations - developed SVT  during stress Myoview 09/2011. b. Noted on tele 03/2012.  . Valvular heart disease     a. moderately thickened/mildly calcified aortic valve, mod MR 03/20/12  . Chronic kidney disease     Stage II  . PAC (premature atrial contraction)   . CAD (coronary artery disease) 03/2011    a. NSTEMI 03/2011 complicated by pulm edema s/p DES to subtotal LAD. b. [place] s/p PTCA/cutting balloon to prox LAD for ISR 12/01/11 (med rx for RCA/Cx dz).  c. NSTEMI 03/2012 s/p cutting angioplasty of ISR of prox LAD 03/21/12 (PLAVIX NONRESPONDER).    Medications:  Scheduled:    Assessment: 79 yo F with chest pain & EKG changes to start on IV heparin.  Her platelet count has declined over past several months but is >100K today.  Hg has been stable & no bleeding noted.  She is on asa & prasugrel chronically.   Goal of Therapy:  Heparin level 0.3-0.7 units/ml Monitor platelets by anticoagulation protocol: Yes   Plan:  1) Heparin 4000 unit IV bolus followed by continuous infusion at 900 units/hr 2) Check 8 hr heparin level 3) Daily heparin level & CBC while on heparin 4) Cardiology consult pending transfer to [facility] -follow-up plan  Elson Clan 08/17/2012,8:53 AM

## 2012-08-18 ENCOUNTER — Encounter (HOSPITAL_COMMUNITY)
Admission: EM | Disposition: A | Discharge status: Home Health | Payer: Self-pay | Source: Home / Self Care | Attending: Cardiology

## 2012-08-18 DIAGNOSIS — I2510 Atherosclerotic heart disease of native coronary artery without angina pectoris: Secondary | ICD-10-CM

## 2012-08-18 DIAGNOSIS — I214 Non-ST elevation (NSTEMI) myocardial infarction: Secondary | ICD-10-CM

## 2012-08-18 HISTORY — PX: LEFT HEART CATHETERIZATION WITH CORONARY ANGIOGRAM: SHX5451

## 2012-08-18 HISTORY — PX: PERCUTANEOUS CORONARY STENT INTERVENTION (PCI-S): SHX5485

## 2012-08-18 LAB — CBC
MCH: 28.8 pg (ref 26.0–34.0)
MCV: 86.8 fL (ref 78.0–100.0)
Platelets: 130 10*3/uL — ABNORMAL LOW (ref 150–400)
RDW: 14.3 % (ref 11.5–15.5)

## 2012-08-18 LAB — GLUCOSE, CAPILLARY: Glucose-Capillary: 154 mg/dL — ABNORMAL HIGH (ref 70–99)

## 2012-08-18 LAB — BASIC METABOLIC PANEL
BUN: 24 mg/dL — ABNORMAL HIGH (ref 6–23)
Calcium: 9.3 mg/dL (ref 8.4–10.5)
Creatinine, Ser: 1.37 mg/dL — ABNORMAL HIGH (ref 0.50–1.10)
GFR calc Af Amer: 39 mL/min — ABNORMAL LOW (ref >90)
GFR calc non Af Amer: 34 mL/min — ABNORMAL LOW (ref >90)

## 2012-08-18 LAB — TROPONIN I: Troponin I: 0.78 ng/mL (ref <0.30)

## 2012-08-18 SURGERY — LEFT HEART CATHETERIZATION WITH CORONARY ANGIOGRAM
Anesthesia: LOCAL

## 2012-08-18 MED ORDER — MORPHINE SULFATE 2 MG/ML IJ SOLN
2.0000 mg | INTRAMUSCULAR | Status: DC | PRN
  Administered 2012-08-18 (×2): 2 mg via INTRAVENOUS
  Filled 2012-08-18: qty 1

## 2012-08-18 MED ORDER — FENTANYL CITRATE 0.05 MG/ML IJ SOLN
INTRAMUSCULAR | Status: AC
  Filled 2012-08-18: qty 2

## 2012-08-18 MED ORDER — SODIUM CHLORIDE 0.9 % IV SOLN
INTRAVENOUS | Status: AC
  Administered 2012-08-18: 16:00:00 via INTRAVENOUS

## 2012-08-18 MED ORDER — SODIUM CHLORIDE 0.9 % IV BOLUS (SEPSIS)
250.0000 mL | Freq: Once | INTRAVENOUS | Status: AC
  Administered 2012-08-18: 250 mL via INTRAVENOUS

## 2012-08-18 MED ORDER — BIVALIRUDIN 250 MG IV SOLR
INTRAVENOUS | Status: AC
  Filled 2012-08-18: qty 250

## 2012-08-18 MED ORDER — METOPROLOL TARTRATE 25 MG PO TABS
25.0000 mg | ORAL_TABLET | Freq: Two times a day (BID) | ORAL | Status: DC
  Administered 2012-08-18 – 2012-08-22 (×9): 25 mg via ORAL
  Filled 2012-08-18 (×9): qty 1

## 2012-08-18 MED ORDER — ONDANSETRON HCL 4 MG/2ML IJ SOLN
INTRAMUSCULAR | Status: AC
  Filled 2012-08-18: qty 2

## 2012-08-18 MED ORDER — HEPARIN (PORCINE) IN NACL 2-0.9 UNIT/ML-% IJ SOLN
INTRAMUSCULAR | Status: AC
  Filled 2012-08-18: qty 1500

## 2012-08-18 MED ORDER — MIDAZOLAM HCL 2 MG/2ML IJ SOLN
INTRAMUSCULAR | Status: AC
  Filled 2012-08-18: qty 2

## 2012-08-18 MED ORDER — MORPHINE SULFATE 2 MG/ML IJ SOLN
INTRAMUSCULAR | Status: AC
  Filled 2012-08-18: qty 1

## 2012-08-18 MED ORDER — LIDOCAINE HCL (PF) 1 % IJ SOLN
INTRAMUSCULAR | Status: AC
  Filled 2012-08-18: qty 30

## 2012-08-18 NOTE — Interval H&P Note (Signed)
History and Physical Interval Note:  08/18/2012 1:53 PM  Alexandra [name]  has presented today for cardiac cath with the diagnosis of Chest pain  The various methods of treatment have been discussed with the patient and family. After consideration of risks, benefits and other options for treatment, the patient has consented to  Procedure(s): LEFT HEART CATHETERIZATION WITH CORONARY ANGIOGRAM (N/A) as a surgical intervention .  The patient's history has been reviewed, patient examined, no change in status, stable for surgery.  I have reviewed the patient's chart and labs.  Questions were answered to the patient's satisfaction.     Zahra Peffley

## 2012-08-18 NOTE — Progress Notes (Signed)
Pt attempted to have a BM with resultant 10/10 chest pain. Nitro gtt increased as documented. Ward Givens NP paged. EKG 12 lead stat ordered. Pain down to 9/10. Scheduled for cath lab at noon. Orders received from Ward Givens NP.

## 2012-08-18 NOTE — Progress Notes (Signed)
Site area: right groin  Site Prior to Removal:  Level 0  Pressure Applied For 20 MINUTES    Minutes Beginning at 1825  Manual:   yes  Patient Status During Pull:  stable  Post Pull Groin Site:  Level 1  Post Pull Instructions Given:  yes  Post Pull Pulses Present:  yes  Dressing Applied:  yes  Comments:  Bruising at site post sheath pull, no hematoma noted. Gauze pressure dressing applied

## 2012-08-18 NOTE — Progress Notes (Signed)
All personal belongings brought to cath lab to be transported with pt to new room.

## 2012-08-18 NOTE — Progress Notes (Signed)
   S: c/o sudden onset of 10/10 chest pain while having attempting to have a bowel movement. She states her pain began in her throat and continued across her chest down both arms. She is unable to characterize the pain but does endorse numbness and tingling in both arms. She is also complaining of some SOB, and nausea.  O:  She appears in some distress and is flushed upon exam.  Lungs are clear, diminished in bilateral bases. All peripheral pulses 2+ and equal bilaterally, HR irregular with a 2/6 systolic murmur auscultated. Abdomen soft, non-tender with present bowel sounds.   Filed Vitals:   08/18/12 1100  BP: 106/56  Pulse: 76  Temp:   Resp: 42   A/P: 1. NSTEMI: Recurrent CP, Nitro gtt increased from 5-51mcg/min, 2mg  of Morphine administered as well as a bolus of NS for soft BP's. She now rates her pain a 2/10 after the above mentioned interventions - and is in much better spirits.  Plan for Cardiac cath today next case.  Nicolasa Ducking, NP

## 2012-08-18 NOTE — H&P (View-Only) (Signed)
    SUBJECTIVE: No chest pain this am.   BP 87/38  Pulse 62  Temp(Src) 96.7 F (35.9 C) (Oral)  Resp 18  Ht 5\' 6"  (1.676 m)  Wt 186 lb 4.6 oz (84.5 kg)  BMI 30.08 kg/m2  SpO2 100%  Intake/Output Summary (Last 24 hours) at 08/18/12 0720 Last data filed at 08/18/12 0600  Gross per 24 hour  Intake 1300.34 ml  Output   1570 ml  Net -269.66 ml    PHYSICAL EXAM General: Well developed, well nourished, in no acute distress. Alert and oriented x 3.  Psych:  Good affect, responds appropriately Neck: No JVD. No masses noted.  Lungs: Clear bilaterally with no wheezes or rhonci noted.  Heart: RRR with no murmurs noted. Abdomen: Bowel sounds are present. Soft, non-tender.  Extremities: No lower extremity edema.   LABS: Basic Metabolic Panel:  Recent Labs  16/10/96 0717 08/18/12 0432  NA 136 141  K 3.9 4.3  CL 96 103  CO2 29 29  GLUCOSE 155* 107*  BUN 27* 24*  CREATININE 1.22* 1.37*  CALCIUM 9.7 9.3   CBC:  Recent Labs  08/17/12 0717 08/18/12 0432  WBC 8.6 7.2  NEUTROABS 7.2  --   HGB 11.0* 9.4*  HCT 32.4* 28.3*  MCV 86.4 86.8  PLT 130* 130*   Cardiac Enzymes:  Recent Labs  08/17/12 1456 08/17/12 1811 08/18/12 0007  TROPONINI 0.69* 0.82* 0.78*    Current Meds: . amitriptyline  10 mg Oral QHS  . amLODipine  5 mg Oral Daily  . antiseptic oral rinse  15 mL Mouth Rinse BID  . aspirin EC  81 mg Oral q morning - 10a  . cholecalciferol  1,000 Units Oral Daily  . ferrous sulfate  1 tablet Oral BID  . glimepiride  1 mg Oral Q lunch  . insulin aspart  0-15 Units Subcutaneous TID WC  . irbesartan  150 mg Oral QHS  . metoprolol  75 mg Oral BID  . pantoprazole  40 mg Oral BID  . prasugrel  5 mg Oral Daily  . rosuvastatin  10 mg Oral q1800  . sodium chloride  3 mL Intravenous Q12H     ASSESSMENT AND PLAN:  1. NSTEMI/Unstable Angina/CAD: She has history of complex CAD with moderately severe disease in RCA and Circumflex by last cath October 2013 as well as  stent mid LAD that has been treated twice for restenosis (july and October 2013). Now admitted with symptoms c/w unstable angina. Cardiac markers are mildly abnormal. Currently denies CP. She is on Prasugrel chronically. Heparin and Nitro gtts infusing. Continue BB, ASA, CCB, and Statin therapies. Plan for cath today. Will most likely be diagnostic cath only as she had a slight fall in hemoglobin on heparin drip overnight. No signs of bleeding.    2. Wandering Atrial Pacemaker: Pt has irregular heart rhythm however atrial activity with varied P morphology. WAP. Continue to follow.   3. CKD, stage 2: BUN/Creatinine stable. Continue to monitor.   4. DM: Continue ssi. Cont amaryl.   5. HLD: Cont crestor.   6. Abnl CXR: H/o sarcoid followed by pulm. Defer further eval/CT to Dr. Kriste Basque in outpt setting.    Alexandra [name]  3/20/20147:20 AM

## 2012-08-18 NOTE — Progress Notes (Signed)
    SUBJECTIVE: No chest pain this am.   BP 87/38  Pulse 62  Temp(Src) 96.7 F (35.9 C) (Oral)  Resp 18  Ht 5' 6" (1.676 m)  Wt 186 lb 4.6 oz (84.5 kg)  BMI 30.08 kg/m2  SpO2 100%  Intake/Output Summary (Last 24 hours) at 08/18/12 0720 Last data filed at 08/18/12 0600  Gross per 24 hour  Intake 1300.34 ml  Output   1570 ml  Net -269.66 ml    PHYSICAL EXAM General: Well developed, well nourished, in no acute distress. Alert and oriented x 3.  Psych:  Good affect, responds appropriately Neck: No JVD. No masses noted.  Lungs: Clear bilaterally with no wheezes or rhonci noted.  Heart: RRR with no murmurs noted. Abdomen: Bowel sounds are present. Soft, non-tender.  Extremities: No lower extremity edema.   LABS: Basic Metabolic Panel:  Recent Labs  08/17/12 0717 08/18/12 0432  NA 136 141  K 3.9 4.3  CL 96 103  CO2 29 29  GLUCOSE 155* 107*  BUN 27* 24*  CREATININE 1.22* 1.37*  CALCIUM 9.7 9.3   CBC:  Recent Labs  08/17/12 0717 08/18/12 0432  WBC 8.6 7.2  NEUTROABS 7.2  --   HGB 11.0* 9.4*  HCT 32.4* 28.3*  MCV 86.4 86.8  PLT 130* 130*   Cardiac Enzymes:  Recent Labs  08/17/12 1456 08/17/12 1811 08/18/12 0007  TROPONINI 0.69* 0.82* 0.78*    Current Meds: . amitriptyline  10 mg Oral QHS  . amLODipine  5 mg Oral Daily  . antiseptic oral rinse  15 mL Mouth Rinse BID  . aspirin EC  81 mg Oral q morning - 10a  . cholecalciferol  1,000 Units Oral Daily  . ferrous sulfate  1 tablet Oral BID  . glimepiride  1 mg Oral Q lunch  . insulin aspart  0-15 Units Subcutaneous TID WC  . irbesartan  150 mg Oral QHS  . metoprolol  75 mg Oral BID  . pantoprazole  40 mg Oral BID  . prasugrel  5 mg Oral Daily  . rosuvastatin  10 mg Oral q1800  . sodium chloride  3 mL Intravenous Q12H     ASSESSMENT AND PLAN:  1. NSTEMI/Unstable Angina/CAD: She has history of complex CAD with moderately severe disease in RCA and Circumflex by last cath October 2013 as well as  stent mid LAD that has been treated twice for restenosis (july and October 2013). Now admitted with symptoms c/w unstable angina. Cardiac markers are mildly abnormal. Currently denies CP. She is on Prasugrel chronically. Heparin and Nitro gtts infusing. Continue BB, ASA, CCB, and Statin therapies. Plan for cath today. Will most likely be diagnostic cath only as she had a slight fall in hemoglobin on heparin drip overnight. No signs of bleeding.    2. Wandering Atrial Pacemaker: Pt has irregular heart rhythm however atrial activity with varied P morphology. WAP. Continue to follow.   3. CKD, stage 2: BUN/Creatinine stable. Continue to monitor.   4. DM: Continue ssi. Cont amaryl.   5. HLD: Cont crestor.   6. Abnl CXR: H/o sarcoid followed by pulm. Defer further eval/CT to Dr. Nadel in outpt setting.    MCALHANY,CHRISTOPHER  3/20/20147:20 AM  

## 2012-08-18 NOTE — Progress Notes (Signed)
ANTICOAGULATION CONSULT NOTE - Follow Up Consult  Pharmacy Consult for heparin Indication: chest pain/ACS  Patient Measurements: Height: 5\' 6"  (167.6 cm) Weight: 186 lb 4.6 oz (84.5 kg) IBW/kg (Calculated) : 59.3 Heparin Dosing Weight: 77kg  Vital Signs: Temp: 97.3 F (36.3 C) (03/20 0747) Temp src: Oral (03/20 0747) BP: 120/50 mmHg (03/20 0800) Pulse Rate: 69 (03/20 0800)  Labs:  Recent Labs  08/17/12 0717 08/17/12 0801 08/17/12 1456 08/17/12 1811 08/18/12 0007 08/18/12 0432  HGB 11.0*  --   --   --   --  9.4*  HCT 32.4*  --   --   --   --  28.3*  PLT 130*  --   --   --   --  130*  APTT  --  35  --   --   --   --   LABPROT  --  14.0  --   --   --   --   INR  --  1.09  --   --   --   --   HEPARINUNFRC  --   --   --  0.57  --  0.55  CREATININE 1.22*  --   --   --   --  1.37*  TROPONINI <0.30  --  0.69* 0.82* 0.78*  --     Estimated Creatinine Clearance: 32.3 ml/min (by C-G formula based on Cr of 1.37).   Medications:  Heparin infusion at 900 units/hr  Assessment: 79 year old female transferred from [facility] for chest pain/nstemi currently at goal (HL=0.55) on IV heparin drip with plans for cath today.   Goal of Therapy:  Heparin level 0.3-0.7 units/ml Monitor platelets by anticoagulation protocol: Yes   Plan:  Continue heparin at 900 units/hr Daily HL/cbc  Harland German, Pharm D 08/18/2012 10:09 AM

## 2012-08-18 NOTE — CV Procedure (Addendum)
Cardiac Catheterization Operative Report  Alexandra [name] [id number] 3/20/20143:16 PM Alexandra Mcalpine, MD  Procedure Performed:  1. Left Heart Catheterization 2. Selective Coronary Angiography 3. Left ventricular pressures 4. IVUS LAD 5. PTCA/DES x 1 proximal LAD  Operator: Alexandra Carrow, MD  Indication: 79 yo WF with history of  CAD s/p multiple previous LAD interventions, DM, HTN, HLD, PAD who is readmitted with NSTEMI. She had a Promus DES placed in the proximal LAD in October 2012 in the setting of a NSTEMI and her LAD was subtotally occluded proximally. She was also found at that time to have severe stenosis in the RCA and Circumflex but these vessels have been managed medically. She has been readmitted in July 2013 and October 2013, both times with severe restenosis in the proximal LAD stented segment in the severely calcified vessel. She was treated with cutting balloon angioplasty in July 2013 and October 2013. Long discussion with pt before cath today about consideration for CABG but given her age, she has adamantly refused to consider CABG.                                     Procedure Details: The risks, benefits, complications, treatment options, and expected outcomes were discussed with the patient. The patient and/or family concurred with the proposed plan, giving informed consent. The patient was brought to the cath lab after IV hydration was begun and oral premedication was given. The patient was further sedated with Versed and Fentanyl. The right groin was prepped and draped in the usual manner. Using the modified Seldinger access technique, a 5 French sheath was placed in the right femoral artery. Standard diagnostic catheters were used to perform selective coronary angiography. A pigtail catheter was used to measure LV pressures. She was found to have severe restenosis in the proximal LAD stent.   We elected to proceed to PCI of the LAD stent restenosis. Her sheath was  upsized to a 6 [place] system. She was given a bolus of Angiomax and a drip was started. (She has been on chronic Effient therapy). I then engaged her left main with a XB LAD 3.5 guiding catheter. When the ACT was greater than 200, I passed a BMW wire down the LAD. The lesion in the stent was pre-dilated with a 2.5 x 12 mm balloon to make room for the IVUS catheter. I then passed an IVUS catheter down the LAD beyond the stented segment. IVUS imaging confirmed circumferential calcium with dense fibrotic plaque in the mid segment of the stent. The IVUS catheter was removed. I then pre-dilated the stented segment with a 3.0 x 10 mm cutting balloon. At this point, after much consideration, I deployed a 3.0 x 16 mm Promus Premier DES in the proximal LAD, completely covering back to ostium of the LAD and covering the old stent. The proximal segment of the stent was post-dilated with a 3.5 x 12 mm Kincaid balloon. There was an excellent result. The stenosis was taken from 99% down to 0%. There was excellent flow into the distal vessel with no obvious mechanical complications. The mid vessel just after the takeoff of the diagonal branch had a focal stenosis with calcification which was present before the PCI and did not appear to be a new dissection. Flow was excellent down the LAD.   There were no immediate complications. The patient was taken to the recovery area in stable condition.  Hemodynamic Findings: Central aortic pressure: 104/57 Left ventricular pressure: 106/7/21  Angiographic Findings:  Left main: No obstructive disease noted.   Left Anterior Descending Artery: Large caliber vessel with proximal stent extending back to the ostium. There is 99% stenosis within the stent. The diagonal branch is moderate in caliber and has mild plaque disease.   Circumflex Artery: Moderate caliber vessel with diffuse 50% proximal vessel stenosis with calcification throughout the proximal and mid vessel. The obtuse  marginal branch is moderate in caliber and has a focal 70% stenosis.   Right Coronary Artery: Large, dominant vessel with severe calcification throughout the proximal, mid and distal vessel. The proximal vessel has 30% stenosis. There are serial 80% stenoses throughout the mid and distal vessel. The large PDA and posterolateral branches are free of any significant disease.   Left Ventricular Angiogram: Deferred.   Impression: 1. NSTEMI secondary to severe restenosis proximal LAD stent 2. Successful PTCA/DES x 1 proximal LAD 3. Moderately severe disease in the RCA and Circumflex, both vessels are tortuous and heavily calcified and not favorable for PCI.   Recommendations: This patient has multi-vessel disease with third recurrence of restenosis in the LAD stent (treated twice-July 2013 and October 2013-with cutting balloon angioplasty). I have carefully considered CABG and discussed this at length with the patient before the cardiac cath today. She has refused consideration for CABG. Her LAD lesion was clearly the culprit for her NSTEMI. We were able to get a favorable result in the heavily calcified stented segment of the LAD. I am hopeful that she will get a better result with the new stent. With the help of IVUS data, we were able to fully expand the old stent with the cutting balloon and then had a very good result with the new Promus DES. She will need ASA and Effient for lifetime. Will continue Effient 5 mg po Qdaily given her age. She has tolerated this well over the last 8 months. Continue other anti-anginal therapy. I would only consider attempt at PCI of RCA or Circumflex should she have recurrent ACS in absence of issues with the LAD stent. PCI of the RCA or Circumflex will be very difficult given degree of calcium and tortuosity. Rotablator atherectomy of the RCA would not be favorable with severe tortuosity of vessel.        Complications:  None. The patient tolerated the procedure well.

## 2012-08-18 NOTE — Progress Notes (Addendum)
At 2226 Pt Vomited after HR briefly in the 30s. No EKG changes noted on monitor will get 12 lead. Pt denies CP. Right Groin site is stable level 1

## 2012-08-18 NOTE — CV Procedure (Signed)
Cardiac Catheterization Operative Report  Alexandra [name] [id number] 3/20/20145:37 PM [name], MD  Procedure Performed:  1. Selective Coronary Angiography  Operator: Verne Carrow, MD  Indication: 79 yo female with severe triple vessel CAD but who refuses consideration for CABG and underwent complex PCI of LAD stent restenosis this afternoon. 20 minutes post procedure, the patient began to feel nauseous and had onset of neck pain. Her EKG which was normal immediately post PCI showed anterolateral and lateral ST depression. She was taken back to the cath lab for relook cath.                                        Procedure Details: Emergency consent obtained. The risks, benefits, complications, treatment options, and expected outcomes were discussed with the patient. The patient and/or family concurred with the proposed plan, giving verbal informed consent. The patient was further sedated with Versed and Fentanyl. The right groin had a 6 French sheath in place. The right groin was prepped and draped in the usual manner.  The sheath was changed for a new 6 [place] sheath. Standard diagnostic catheters were used to perform selective coronary angiography. The left coronary system and LAD stent were patent. The RCA was patent. Of note, as stated in the first cath note, the patient has a focal, eccentric calcified plaque in the mid LAD just after the diagonal branch that is unchanged from the cath in 2013. IVUS imaging of this area on the first cath showed there to be a 4.35mm2 area involving the eccentric, heavily calcified stenosis. Since this was unchanged, we did not address the stenosis as it was felt to be non flow limiting.   There were no immediate complications. The patient was taken to the recovery area in stable condition.   Central aortic pressure: 105/66  Left main: No obstructive disease noted.   Left Anterior Descending Artery: Large caliber vessel with proximal stent  extending back to the ostium. No evidence of thrombus in the newly placed stent. No edge tears.  The diagonal branch is moderate in caliber and has mild plaque disease. The mid LAD beyond the diagonal branch has an eccentric 40% stenosis (confirmed by IVUS not to be flow limiting) and known to be present and unchanged since cath in 2013.   Circumflex Artery: Moderate caliber vessel with diffuse 50% proximal vessel stenosis with calcification throughout the proximal and mid vessel. The obtuse marginal branch is moderate in caliber and has a focal 70% stenosis.   Right Coronary Artery: Large, dominant vessel with severe calcification throughout the proximal, mid and distal vessel. The proximal vessel has 30% stenosis. There are serial 80% stenoses throughout the mid and distal vessel. The large PDA and posterolateral branches are free of any significant disease.   Left Ventricular Angiogram: Deferred.   Impression:  1. Neck pain and ST depression post PCI (See full cath note from earlier today for details of PCI) 2. Patent stent proximal LAD with no obvious mechanical complications.  3. Moderately severe disease in the RCA and Circumflex, both vessels are tortuous and heavily calcified and not favorable for PCI.   Recommendations: Continue current therapy including ASA 81 mg po QDaily and Effient 5 mg po Qdaily (she has been on reduced dose for 8 months and tolerating well). See full cath note from earlier today for additional summary.   Complications:  None. The patient  tolerated the procedure well.

## 2012-08-19 LAB — BASIC METABOLIC PANEL
BUN: 22 mg/dL (ref 6–23)
Creatinine, Ser: 1.2 mg/dL — ABNORMAL HIGH (ref 0.50–1.10)
GFR calc non Af Amer: 40 mL/min — ABNORMAL LOW (ref >90)
Glucose, Bld: 109 mg/dL — ABNORMAL HIGH (ref 70–99)
Potassium: 4.3 mEq/L (ref 3.5–5.1)

## 2012-08-19 LAB — CBC
HCT: 27.7 % — ABNORMAL LOW (ref 36.0–46.0)
Hemoglobin: 9.3 g/dL — ABNORMAL LOW (ref 12.0–15.0)
MCH: 28.8 pg (ref 26.0–34.0)
MCHC: 33.6 g/dL (ref 30.0–36.0)
MCV: 85.8 fL (ref 78.0–100.0)
RDW: 14.4 % (ref 11.5–15.5)

## 2012-08-19 LAB — GLUCOSE, CAPILLARY
Glucose-Capillary: 94 mg/dL (ref 70–99)
Glucose-Capillary: 102 mg/dL — ABNORMAL HIGH (ref 70–99)

## 2012-08-19 NOTE — Progress Notes (Signed)
CARDIAC REHAB PHASE I   PRE:  Rate/Rhythm: 73SR  BP:  Supine: 105/43  Sitting:   Standing:    SaO2: 81%RA  MODE:  Ambulation: 300 ft   POST:  Rate/Rhythm: 129 ST PACs  BP:  Supine:   Sitting: 114/54  Standing:    SaO2: 91-92% 2L  SATURATION QUALIFICATIONS: (This note is used to comply with regulatory documentation for home oxygen)  Patient Saturations on Room Air at Rest = 81%  Patient Saturations on Room Air while Ambulating = 81%  Patient Saturations on 2 Liters of oxygen while Ambulating = 91-92%  Please briefly explain why patient needs home oxygen: desats when walking and at rest  [phone] Pt put on RA while in room. Desat on RA at rest and walking. Walked 300 ft with gait belt and asst x 2 on 2L with slow steady gait. Pt very talkative and stated did not feel SOB even though sats so low. To BSC prior to walk and recliner with call bell after walk. Pt has stent booklet and knows to take efficient. Pt tired by end of walk. Call bell in reach.    Luetta Nutting, RN BSN  08/19/2012 8:43 AM

## 2012-08-19 NOTE — Progress Notes (Signed)
Patient Name: Alexandra [name] Date of Encounter: 08/19/2012     Active Problems:   * No active hospital problems. *    SUBJECTIVE  Doing much better.  Went back to the lab for nausea.  Currently stable without chest pain  CURRENT MEDS . amitriptyline  10 mg Oral QHS  . antiseptic oral rinse  15 mL Mouth Rinse BID  . aspirin EC  81 mg Oral q morning - 10a  . cholecalciferol  1,000 Units Oral Daily  . ferrous sulfate  1 tablet Oral BID  . glimepiride  1 mg Oral Q lunch  . insulin aspart  0-15 Units Subcutaneous TID WC  . metoprolol  25 mg Oral BID  . pantoprazole  40 mg Oral BID  . prasugrel  5 mg Oral Daily  . rosuvastatin  10 mg Oral q1800    OBJECTIVE  Filed Vitals:   08/19/12 0500 08/19/12 0530 08/19/12 0600 08/19/12 0700  BP: 83/31 105/41 118/37 113/39  Pulse: 65 69  35  Temp:      TempSrc:      Resp:      Height:      Weight:      SpO2: 97% 92%  75%    Intake/Output Summary (Last 24 hours) at 08/19/12 0846 Last data filed at 08/19/12 0201  Gross per 24 hour  Intake 1206.95 ml  Output    875 ml  Net 331.95 ml   Filed Weights   08/17/12 1100 08/18/12 0443 08/19/12 0035  Weight: 185 lb 13.6 oz (84.3 kg) 186 lb 4.6 oz (84.5 kg) 192 lb 4.8 oz (87.227 kg)    PHYSICAL EXAM  General: Pleasant, NAD. Neuro: Alert and oriented X 3. Moves all extremities spontaneously.  02 in place Psych: Normal affect. HEENT:  Normal  Neck: Supple without bruits or JVD. Lungs:  Resp regular and unlabored, CTA. Heart: RRR no s3, s4, or murmurs. Extremities: No clubbing, cyanosis or edema. DP/PT/Radials 2+ and equal bilaterally.  Accessory Clinical Findings  CBC  Recent Labs  08/17/12 0717 08/18/12 0432 08/19/12 0558  WBC 8.6 7.2 7.5  NEUTROABS 7.2  --   --   HGB 11.0* 9.4* 9.3*  HCT 32.4* 28.3* 27.7*  MCV 86.4 86.8 85.8  PLT 130* 130* 117*   Basic Metabolic Panel  Recent Labs  08/18/12 0432 08/19/12 0558  NA 141 139  K 4.3 4.3  CL 103 103  CO2 29 25    GLUCOSE 107* 109*  BUN 24* 22  CREATININE 1.37* 1.20*  CALCIUM 9.3 8.9   Liver Function Tests No results found for this basename: AST, ALT, ALKPHOS, BILITOT, PROT, ALBUMIN,  in the last 72 hours No results found for this basename: LIPASE, AMYLASE,  in the last 72 hours Cardiac Enzymes  Recent Labs  08/17/12 1456 08/17/12 1811 08/18/12 0007  TROPONINI 0.69* 0.82* 0.78*   BNP No components found with this basename: POCBNP,  D-Dimer No results found for this basename: DDIMER,  in the last 72 hours Hemoglobin A1C No results found for this basename: HGBA1C,  in the last 72 hours Fasting Lipid Panel No results found for this basename: CHOL, HDL, LDLCALC, TRIG, CHOLHDL, LDLDIRECT,  in the last 72 hours Thyroid Function Tests No results found for this basename: TSH, T4TOTAL, FREET3, T3FREE, THYROIDAB,  in the last 72 hours   ECG  Pending.  Not done yet  Radiology/Studies  Dg Chest Portable 1 View  08/17/2012  *RADIOLOGY REPORT*  Clinical Data: Chest  pain.  PORTABLE CHEST - 1 VIEW  Comparison: Chest x-ray 03/23/2011.  Findings: Lung volumes appear slightly low.  There is diffuse interstitial prominence throughout the lungs bilaterally, progressively increased over numerous prior examinations.  The overall appearance is only slightly worsened when compared to the most recent prior study from 03/28/2012.  No focal confluent airspace consolidation.  No pleural effusions.  No definite pulmonary edema (although this is difficult to entirely exclude given the chronic interstitial changes).  Heart size is mildly enlarged. The patient is rotated to the right on today's exam, resulting in distortion of the mediastinal contours and reduced diagnostic sensitivity and specificity for mediastinal pathology. Atherosclerosis in the thoracic aorta.  IMPRESSION: 1.  Unusual appearance of the lungs, as above, progressively worsened over numerous prior examinations.  The overall appearance is favored to  represent an underlying interstitial lung disease. This could be better evaluated with follow-up non emergent high- resolution chest CT if clinically indicated. 2.  Mild cardiomegaly. 3.  Atherosclerosis.   Original Report Authenticated By: Trudie Reed, M.D.     ASSESSMENT AND PLAN  SP PCI of LAD with relook Post procedure anemia.     Plan to keep another day -- hopefully wean 02 Ambulate Recheck CBC in am.    Signed, Shawnie Pons MD, [facility], [email or web address]

## 2012-08-19 NOTE — Progress Notes (Signed)
Pauses averaging 1.68 to 1.81 noted frequently. Pt asymptomatic and sleeping. Pauses consistant with Hx of wandering atrial pacemaker (WAP).

## 2012-08-19 NOTE — Progress Notes (Signed)
Spoke with pt regarding presciption coverage.  Pt states that she has coverage through [employer] and gave me number for her pharmacy to verify [facility] in [place]).  Pharm Tech states that pt has Medicare Part D benefits and co-pay for Effient is $107.  Pt aware and fine with charges as she was used to paying this amount. Jeselle Hiser J. Lucretia Roers, RN, BSN, [employer] [phone]

## 2012-08-20 ENCOUNTER — Inpatient Hospital Stay (HOSPITAL_COMMUNITY): Payer: Medicare Other

## 2012-08-20 DIAGNOSIS — I499 Cardiac arrhythmia, unspecified: Secondary | ICD-10-CM | POA: Diagnosis present

## 2012-08-20 DIAGNOSIS — I25810 Atherosclerosis of coronary artery bypass graft(s) without angina pectoris: Secondary | ICD-10-CM | POA: Diagnosis present

## 2012-08-20 LAB — CBC
Hemoglobin: 9.2 g/dL — ABNORMAL LOW (ref 12.0–15.0)
MCH: 29.4 pg (ref 26.0–34.0)
MCH: 28.9 pg (ref 26.0–34.0)
MCHC: 33.6 g/dL (ref 30.0–36.0)
MCV: 87.4 fL (ref 78.0–100.0)
MCV: 86.8 fL (ref 78.0–100.0)
Platelets: 100 10*3/uL — ABNORMAL LOW (ref 150–400)
Platelets: 99 10*3/uL — ABNORMAL LOW (ref 150–400)
RBC: 3.18 MIL/uL — ABNORMAL LOW (ref 3.87–5.11)
RDW: 14.4 % (ref 11.5–15.5)
WBC: 7.6 10*3/uL (ref 4.0–10.5)

## 2012-08-20 LAB — BASIC METABOLIC PANEL
Calcium: 8.8 mg/dL (ref 8.4–10.5)
Creatinine, Ser: 1.51 mg/dL — ABNORMAL HIGH (ref 0.50–1.10)
GFR calc non Af Amer: 30 mL/min — ABNORMAL LOW (ref >90)
Sodium: 137 mEq/L (ref 135–145)

## 2012-08-20 LAB — GLUCOSE, CAPILLARY
Glucose-Capillary: 160 mg/dL — ABNORMAL HIGH (ref 70–99)
Glucose-Capillary: 72 mg/dL (ref 70–99)

## 2012-08-20 MED ORDER — CLOPIDOGREL BISULFATE 75 MG PO TABS
75.0000 mg | ORAL_TABLET | Freq: Every day | ORAL | Status: DC
  Administered 2012-08-20 – 2012-08-22 (×3): 75 mg via ORAL
  Filled 2012-08-20 (×4): qty 1

## 2012-08-20 NOTE — Progress Notes (Signed)
   CARE MANAGEMENT NOTE 08/20/2012  Patient:  Alexandra [name], Alexandra [name]   Account Number:  [id number]  Date Initiated:  08/17/2012  Documentation initiated by:  Junius Creamer  Subjective/Objective Assessment:   adm w  angina     Action/Plan:   lives alone, pcp dr Lorin Picket nadel   Anticipated DC Date:  08/21/2012   Anticipated DC Plan:  HOME/SELF CARE      DC Planning Services  CM consult  Medication Assistance      Choice offered to / List presented to:             Status of service:  Completed, signed off Medicare Important Message given?   (If response is "NO", the following Medicare IM given date fields will be blank) Date Medicare IM given:   Date Additional Medicare IM given:    Discharge Disposition:  HOME/SELF CARE  Per UR Regulation:  Reviewed for med. necessity/level of care/duration of stay  If discussed at Long Length of Stay Meetings, dates discussed:    Comments:  08/20/2012 1230 NCM spoke to pt and states she has used her 30 day free trial of Effient. States they had started her back on Plavix. States she paid $75 for Plavix. She is able to afford Plavix. Daryl Eastern RN CCM Case Mgmt phone [phone]  08/19/12 @ 1045.Marland KitchenMarland KitchenOletta Cohn, RN, [facility] [phone] Spoke with pt regarding presciption coverage.  Pt states that she has coverage through [employer] and gave me number for her pharmacy to verify [facility] in [place]).  Pharm Tech states that pt has Medicare Part D benefits and co-pay for Effient is $107.  Pt aware and fine with charges as she was used to paying this amount.  3/19 1123a debbie dowell rn,bsn

## 2012-08-20 NOTE — Progress Notes (Signed)
Patient continues with poor appetite.  Continues to c/o mid sternal chest discomfort, sore throat.  PA, MD aware.

## 2012-08-20 NOTE — Progress Notes (Signed)
Patient refused to walk this AM. " I have not eaten breakfast this morning and I do not feel like walking.  I haven't eaten breakfast and I have this discomfort in my neck and upper chest area. Will follow up with the patient later.

## 2012-08-20 NOTE — Progress Notes (Signed)
CARDIAC REHAB PHASE I   PRE:  Rate/Rhythm: irregular 82 no definite p wave   BP:    Sitting: 107/45     SaO2: 86 Room Air Patient placed on 2l/min of of oxygen  MODE:  Ambulation: 140 ft   POST:  Rate/Rhythem: irregular 83   BP:    Sitting: 102/72    SaO2: 99% on 2l/min  Cardiac rehab phase 1 1246- 1313  Patient oxygen saturation initially 86% on room with a recheck of 90% on room air.  Patient placed on 2l/min of oxygen with a SA02 of 94%.  Patient ambulated in the hallway complained of throat pain during ambulation.  Patient assisted to recliner with call bell within reach.  Patient has visitors present. Patients RN notified of patients rhythm and being placed back on oxygen.   Harlon Flor, Arta Bruce

## 2012-08-20 NOTE — Progress Notes (Signed)
Patient ID: Alexandra [name], female   DOB: Oct 22, 1923, 79 y.o.   MRN: [id number]   SUBJECTIVE:  The patient continues to have a vague sensation of discomfort at the lower sternum. She also describes an unusual sensation in her throat. EKG did not show any significant changes. She has had coronary intervention since being here. She had a relook cath showing that the areas were patent. In addition the patient's hemoglobin continues to decrease. It is now down 8.3. There is no obvious bleeding. She is on aspirin and Prasugrel. With current decreasing hemoglobin Prasugrel will have to be changed 2 Plavix in this 79 year old patient.    Filed Vitals:   08/19/12 2100 08/20/12 0050 08/20/12 0535 08/20/12 0700  BP: 113/51 113/59 122/50 123/55  Pulse: 74 61 66 73  Temp: 97.4 F (36.3 C) 98.1 F (36.7 C) 97.5 F (36.4 C) 97.3 F (36.3 C)  TempSrc: Oral Oral Oral   Resp: 19 19 18 18   Height:      Weight:  193 lb 12.8 oz (87.907 kg)    SpO2: 100% 99% 99% 100%    Intake/Output Summary (Last 24 hours) at 08/20/12 0844 Last data filed at 08/19/12 1849  Gross per 24 hour  Intake    600 ml  Output    200 ml  Net    400 ml    LABS: Basic Metabolic Panel:  Recent Labs  13/08/65 0558 08/20/12 0525  NA 139 137  K 4.3 3.8  CL 103 101  CO2 25 27  GLUCOSE 109* 110*  BUN 22 32*  CREATININE 1.20* 1.51*  CALCIUM 8.9 8.8   Liver Function Tests: No results found for this basename: AST, ALT, ALKPHOS, BILITOT, PROT, ALBUMIN,  in the last 72 hours No results found for this basename: LIPASE, AMYLASE,  in the last 72 hours CBC:  Recent Labs  08/19/12 0558 08/20/12 0525  WBC 7.5 7.0  HGB 9.3* 8.6*  HCT 27.7* 25.6*  MCV 85.8 87.4  PLT 117* 100*   Cardiac Enzymes:  Recent Labs  08/17/12 1456 08/17/12 1811 08/18/12 0007  TROPONINI 0.69* 0.82* 0.78*   BNP: No components found with this basename: POCBNP,  D-Dimer: No results found for this basename: DDIMER,  in the last 72  hours Hemoglobin A1C: No results found for this basename: HGBA1C,  in the last 72 hours Fasting Lipid Panel: No results found for this basename: CHOL, HDL, LDLCALC, TRIG, CHOLHDL, LDLDIRECT,  in the last 72 hours Thyroid Function Tests: No results found for this basename: TSH, T4TOTAL, FREET3, T3FREE, THYROIDAB,  in the last 72 hours  RADIOLOGY: Dg Chest Port 1 View  08/20/2012  *RADIOLOGY REPORT*  Clinical Data: Chest pain.  PORTABLE CHEST - 1 VIEW  Comparison: 08/17/2012  Findings: Heavy mitral annulus calcification.  Atheromatous aortic arch.  Chronic interstitial changes.  Linear scarring or subsegmental atelectasis at the lung bases, left greater than right, slightly increased.  Regional bones unremarkable.  IMPRESSION:  1.  Chronic changes as above with   increase in atelectasis versus early infiltrate at the left lung base.   Original Report Authenticated By: D. Andria Rhein, MD    Dg Chest Portable 1 View  08/17/2012  *RADIOLOGY REPORT*  Clinical Data: Chest pain.  PORTABLE CHEST - 1 VIEW  Comparison: Chest x-ray 03/23/2011.  Findings: Lung volumes appear slightly low.  There is diffuse interstitial prominence throughout the lungs bilaterally, progressively increased over numerous prior examinations.  The overall appearance is only slightly worsened when  compared to the most recent prior study from 03/28/2012.  No focal confluent airspace consolidation.  No pleural effusions.  No definite pulmonary edema (although this is difficult to entirely exclude given the chronic interstitial changes).  Heart size is mildly enlarged. The patient is rotated to the right on today's exam, resulting in distortion of the mediastinal contours and reduced diagnostic sensitivity and specificity for mediastinal pathology. Atherosclerosis in the thoracic aorta.  IMPRESSION: 1.  Unusual appearance of the lungs, as above, progressively worsened over numerous prior examinations.  The overall appearance is favored to  represent an underlying interstitial lung disease. This could be better evaluated with follow-up non emergent high- resolution chest CT if clinically indicated. 2.  Mild cardiomegaly. 3.  Atherosclerosis.   Original Report Authenticated By: Trudie Reed, M.D.     PHYSICAL EXAM   Patient is oriented to person time and place. Affect is normal. There is no jugulovenous distention. Lungs are clear. Respiratory effort is nonlabored. Cardiac exam her vitals S1 and S2. The abdomen is soft. Is no peripheral edema. Her right groin cath site is stable. There is no obvious hematoma.   TELEMETRY: I have reviewed telemetry today August 20, 2012. It is very difficult to assess her rhythm by telemetry. There has been question over time as to whether or not this is a wondering atrial pacemaker. Her most recent 12-lead continue to show intermittent P waves. At this point I will assume that she does not have atrial fibrillation. We would not be able to add any further anticoagulation anyway.    ASSESSMENT AND PLAN:    Anemia, normocytic normochromic     The patient's hemoglobin is down 8.3. Hemoglobin was 11 on admission. There is no obvious bleeding. I am hopeful that this will stabilize without further intervention. Prasugrel will be changed to Plavix. I will have to continue these medications along with aspirin with her interventions this admission unless her anemia becomes more severe. Historically she did receive transfusion in 2012. She's been on iron.    CAD      The patient's coronary disease has been treated this admission. I am hopeful that things will remain stable.    Arrhythmia     Overall the patient's rhythm is stable. There has been ongoing question as to whether or not this is truly a wandering atrial pacemaker or whether there is some atrial fibrillation. There are P waves seen on the 12-lead EKGs. For now we will assume there is not atrial fibrillation. Regardless, further anticoagulation cannot  be added at this time with the decreasing hemoglobin.   Willa Rough 08/20/2012 8:44 AM

## 2012-08-21 DIAGNOSIS — I4891 Unspecified atrial fibrillation: Secondary | ICD-10-CM | POA: Diagnosis present

## 2012-08-21 LAB — GLUCOSE, CAPILLARY
Glucose-Capillary: 96 mg/dL (ref 70–99)
Glucose-Capillary: 139 mg/dL — ABNORMAL HIGH (ref 70–99)
Glucose-Capillary: 82 mg/dL (ref 70–99)

## 2012-08-21 LAB — CBC
MCHC: 33.2 g/dL (ref 30.0–36.0)
Platelets: 117 10*3/uL — ABNORMAL LOW (ref 150–400)
RDW: 14.4 % (ref 11.5–15.5)
WBC: 6.9 10*3/uL (ref 4.0–10.5)

## 2012-08-21 NOTE — Progress Notes (Signed)
Patient ID: Alexandra [name], female   DOB: 21-Jun-1923, 79 y.o.   MRN: [id number]   SUBJECTIVE: The patient is doing little better today. She did ambulate some yesterday. She is hypoxic at rest without oxygen. She will need home O2. She continues to have some vague discomfort in the area of the manubrium. This is a chronic problem. She also notes an unusual sensation in her throat primarily when she is ambulating. At this point we do not think she is having active ischemia. I plan no further workup. She is getting stronger but she is still frail. Today's hemoglobin value is not yet available.   Filed Vitals:   08/20/12 1300 08/20/12 2007 08/21/12 0609 08/21/12 0702  BP: 107/75 127/64 127/90   Pulse: 82 80 78   Temp: 98.5 F (36.9 C) 97.6 F (36.4 C) 97.2 F (36.2 C)   TempSrc: Oral Oral Oral   Resp: 16 18 18    Height:      Weight:    190 lb (86.183 kg)  SpO2: 90% 92% 97%     Intake/Output Summary (Last 24 hours) at 08/21/12 0840 Last data filed at 08/21/12 0500  Gross per 24 hour  Intake      0 ml  Output    550 ml  Net   -550 ml    LABS: Basic Metabolic Panel:  Recent Labs  57/84/69 0558 08/20/12 0525  NA 139 137  K 4.3 3.8  CL 103 101  CO2 25 27  GLUCOSE 109* 110*  BUN 22 32*  CREATININE 1.20* 1.51*  CALCIUM 8.9 8.8   Liver Function Tests: No results found for this basename: AST, ALT, ALKPHOS, BILITOT, PROT, ALBUMIN,  in the last 72 hours No results found for this basename: LIPASE, AMYLASE,  in the last 72 hours CBC:  Recent Labs  08/20/12 0525 08/20/12 0927  WBC 7.0 7.6  HGB 8.6* 9.2*  HCT 25.6* 27.6*  MCV 87.4 86.8  PLT 100* 99*   Cardiac Enzymes: No results found for this basename: CKTOTAL, CKMB, CKMBINDEX, TROPONINI,  in the last 72 hours BNP: No components found with this basename: POCBNP,  D-Dimer: No results found for this basename: DDIMER,  in the last 72 hours Hemoglobin A1C: No results found for this basename: HGBA1C,  in the last 72  hours Fasting Lipid Panel: No results found for this basename: CHOL, HDL, LDLCALC, TRIG, CHOLHDL, LDLDIRECT,  in the last 72 hours Thyroid Function Tests: No results found for this basename: TSH, T4TOTAL, FREET3, T3FREE, THYROIDAB,  in the last 72 hours  RADIOLOGY: Dg Chest Port 1 View  08/20/2012  *RADIOLOGY REPORT*  Clinical Data: Chest pain.  PORTABLE CHEST - 1 VIEW  Comparison: 08/17/2012  Findings: Heavy mitral annulus calcification.  Atheromatous aortic arch.  Chronic interstitial changes.  Linear scarring or subsegmental atelectasis at the lung bases, left greater than right, slightly increased.  Regional bones unremarkable.  IMPRESSION:  1.  Chronic changes as above with   increase in atelectasis versus early infiltrate at the left lung base.   Original Report Authenticated By: D. Andria Rhein, MD    Dg Chest Portable 1 View  08/17/2012  *RADIOLOGY REPORT*  Clinical Data: Chest pain.  PORTABLE CHEST - 1 VIEW  Comparison: Chest x-ray 03/23/2011.  Findings: Lung volumes appear slightly low.  There is diffuse interstitial prominence throughout the lungs bilaterally, progressively increased over numerous prior examinations.  The overall appearance is only slightly worsened when compared to the most recent prior study  from 03/28/2012.  No focal confluent airspace consolidation.  No pleural effusions.  No definite pulmonary edema (although this is difficult to entirely exclude given the chronic interstitial changes).  Heart size is mildly enlarged. The patient is rotated to the right on today's exam, resulting in distortion of the mediastinal contours and reduced diagnostic sensitivity and specificity for mediastinal pathology. Atherosclerosis in the thoracic aorta.  IMPRESSION: 1.  Unusual appearance of the lungs, as above, progressively worsened over numerous prior examinations.  The overall appearance is favored to represent an underlying interstitial lung disease. This could be better evaluated with  follow-up non emergent high- resolution chest CT if clinically indicated. 2.  Mild cardiomegaly. 3.  Atherosclerosis.   Original Report Authenticated By: Trudie Reed, M.D.     PHYSICAL EXAM   Patient is oriented to person time and place. Affect is normal. She's comfortable in bed. There is no jugulovenous distention. Lungs are clear. Respiratory effort is nonlabored. Cardiac exam reveals S1 and S2. There are no clicks or significant murmurs. Abdomen is soft. There is no peripheral edema.  TELEMETRY: I have reviewed telemetry today August 21, 2012. She does have some atrial fibrillation with a controlled rate.  ASSESSMENT AND PLAN:    Anemia, normocytic normochromic    I was very concerned about her hemoglobin yesterday. A repeat value showed her hemoglobin was slightly better at 9.2. Today's value is not yet obtained. This will be done. In addition tomorrow as value will be requested. With her hemoglobin in this range and her overall bleeding risk with her age, I had changed her Prasugrel 2 Plavix yesterday. I hope that this will be okay with the interventional team.    CAD     Patient had PCI with a relook this admission. She is stable. The relook  Showed that the original intervention was quite stable. She continues to have a vague sensation in her throat. At this point we will have to assume this is not from ischemia. As I outlined above I have changed her to Plavix.    Hypoxia     The patient has resting hypoxia. I will request plans for home O2. However requested she be ambulated if this is needed to document her need for home O2.  Atrial fibrillation    The patient does have atrial fibrillation. As of today she is not a candidate for further anticoagulation as she is already on dual antiplatelet therapy for her recent coronary therapy. This issue can be followed carefully as an outpatient.  The patient is frail. She lives alone. She has friends that can help her go home.  We should  begin plans for her to be discharged tomorrow.  Willa Rough 08/21/2012 8:40 AM

## 2012-08-22 ENCOUNTER — Encounter (HOSPITAL_COMMUNITY): Payer: Self-pay | Admitting: Physician Assistant

## 2012-08-22 ENCOUNTER — Telehealth: Payer: Self-pay | Admitting: Cardiology

## 2012-08-22 DIAGNOSIS — N183 Chronic kidney disease, stage 3 unspecified: Secondary | ICD-10-CM

## 2012-08-22 DIAGNOSIS — I214 Non-ST elevation (NSTEMI) myocardial infarction: Secondary | ICD-10-CM | POA: Diagnosis present

## 2012-08-22 DIAGNOSIS — I4891 Unspecified atrial fibrillation: Secondary | ICD-10-CM

## 2012-08-22 DIAGNOSIS — I2510 Atherosclerotic heart disease of native coronary artery without angina pectoris: Secondary | ICD-10-CM | POA: Diagnosis present

## 2012-08-22 LAB — GLUCOSE, CAPILLARY
Glucose-Capillary: 89 mg/dL (ref 70–99)
Glucose-Capillary: 118 mg/dL — ABNORMAL HIGH (ref 70–99)

## 2012-08-22 LAB — BASIC METABOLIC PANEL
Calcium: 9.2 mg/dL (ref 8.4–10.5)
GFR calc Af Amer: 42 mL/min — ABNORMAL LOW (ref >90)
GFR calc non Af Amer: 36 mL/min — ABNORMAL LOW (ref >90)
Glucose, Bld: 182 mg/dL — ABNORMAL HIGH (ref 70–99)
Sodium: 138 mEq/L (ref 135–145)

## 2012-08-22 LAB — CBC
MCH: 28.9 pg (ref 26.0–34.0)
MCHC: 33.8 g/dL (ref 30.0–36.0)
Platelets: 121 10*3/uL — ABNORMAL LOW (ref 150–400)
RDW: 14.2 % (ref 11.5–15.5)

## 2012-08-22 MED ORDER — METOPROLOL TARTRATE 50 MG PO TABS: 25.0000 mg | ORAL_TABLET | Freq: Two times a day (BID) | ORAL | Status: DC

## 2012-08-22 MED ORDER — PRASUGREL HCL 5 MG PO TABS
5.0000 mg | ORAL_TABLET | Freq: Every day | ORAL | Status: DC
  Administered 2012-08-22: 5 mg via ORAL
  Filled 2012-08-22: qty 1

## 2012-08-22 MED ORDER — GLIMEPIRIDE 1 MG PO TABS: 1.0000 mg | ORAL_TABLET | Freq: Every day | ORAL | Status: DC

## 2012-08-22 MED ORDER — ACETAMINOPHEN 500 MG PO TABS: 500.0000 mg | ORAL_TABLET | Freq: Four times a day (QID) | ORAL | Status: AC | PRN

## 2012-08-22 MED ORDER — AMLODIPINE BESYLATE 2.5 MG PO TABS
2.5000 mg | ORAL_TABLET | Freq: Every day | ORAL | Status: DC
  Administered 2012-08-22: 2.5 mg via ORAL
  Filled 2012-08-22: qty 1

## 2012-08-22 NOTE — Progress Notes (Signed)
CARDIAC REHAB PHASE I   PRE:  Rate/Rhythm: 96afib  BP:  Supine:   Sitting: 127/72  Standing:    SaO2: 89-91%RA  MODE:  Ambulation: 350 ft   POST:  Rate/Rhythm: 121 afib  BP:  Supine:   Sitting: 140/60  Standing:    SaO2: 86%RA, 95-96%2L  SATURATION QUALIFICATIONS: (This note is used to comply with regulatory documentation for home oxygen)  Patient Saturations on Room Air at Rest = 89-91%  Patient Saturations on Room Air while Ambulating = 86%  Patient Saturations on 2 Liters of oxygen while Ambulating = 95-96%  Please briefly explain why patient needs home oxygen: throat pain when desat to 86%, 2L to keep sats up  [id number] Pt desat on RA during walk and at that time c/o throat pain. Pt walked 350 ft with asst x 2 and rolling walker, most of the walk on 2L. Pt stated throat discomfort a little better on 2L. Left on 2L after walk. To recliner with call bell. Tolerated well except for desat.   Luetta Nutting, RN BSN  08/22/2012 9:10 AM

## 2012-08-22 NOTE — Discharge Summary (Signed)
Discharge Summary   Patient ID: Alexandra [name] MRN: [id number], DOB/AGE: 12/30/1923 79 y.o. Admit date: 08/17/2012 D/C date:     08/22/2012  Primary Cardiologist: Rothbart  Primary Discharge Diagnoses:  1. CAD With NSTEMI this admission - s/p DES to LAD 08/19/12 (severe restenosis in the proximal LAD stent), complex CAD with moderately severe disease in RCA and Cx, pt refused consideration for CABG - history:  NSTEMI 03/2011 complicated by pulm edema s/p DES to subtotal LAD, [place] s/p PTCA/cutting balloon to prox LAD for ISR 12/01/11 (med rx for RCA/Cx dz), NSTEMI 03/2012 s/p cutting angioplasty of ISR of prox LAD 03/21/12 (PLAVIX NONRESPONDER) 2. Atrial fibrillation, rate controlled - given age and need for DAPT for recent stent, will not plan anticoag at this time, this can be discussed further as outpt 3. CKD stage 2 4. DM 5. HLD 6. Hypoxia - qualified for home O2 with ambulation 7. Anemia/thrombocytopenia - will need to f/u PCP 8. HTN 9. Wandering atrial pacemaker by tele this admission 10. Abnormal CXR - to f/u PCP  Secondary Discharge Diagnoses:  1. Asthmatic bronchitis 2. Sarcoidosis - 1990s of lung tx with prednisone temporarily.  3. PVD - 06/2012 Carotid U/S: < 50% bilat ICA stenosis  4. Obesity 5. GERD 6. Diverticulosis 7. IBS 8. DJD 9. Vit D def disease 10. Fibromyalgia 11. Anxiety 12. Intermittent vertigo 13. Venous insuff 14. PSVT -Prior hx of palpitations - developed SVT during stress Myoview 09/2011. b. Noted on tele 03/2012.  15. Valvular heart disease moderately thickened/mildly calcified aortic valve, mod MR 03/20/12  16. PACs  Hospital Course: [name] is an 79 y/o F with history of CAD (2 instances of ISR, plavix nonresponder), remote sarcoidosis, DM, HTN, CKD, anemia who presented to [facility] 08/17/12 with complaints of chest pain. It was described as a 10/10 continuous ache that radiated to bilateral arms causing numbness/tingling, similar to prior  angina. She felt nauseous and dry-heaved x 1. Nothing made the pain worse. She took NTG x3 and 81mg  ASA x2 without relief and then proceeded to call an ambulance. In the ED she received 3 additional SL NTG and 2 more 81mg  ASA. NTG gtt was initiated. EKG showed wandering atrial pacemaker. Initial troponin was negative but symptoms were very concerning for [place]. She was placed on heparin gtt and hydrated in prep for cath. CXR was abnormal with unusual appearance, favored to represent an underlying ILD. Dr. Clifton James advised f/u with PCP. Troponins peaked at 0.82. She underwent cath 08/18/12 demonstrating severe restenosis in the proximal LAD stent that was ultimately treated with PTCA/DES. Dr. Clifton James carefully considered CABG and discussed this at length with the patient prior to her cath, and she refused consideration for this. Dr. Clifton James recommended ASA and Effient (dose reduced to 5mg  79 y/o) for lifetime. His note states "she has been on this dosage of Effient for 6 months with no bleeding complications and she understands that this therapy comes with some risk of bleeding but she is willing to accept as it is the favorable therapy for her with her CAD)." He would only consider attempt at PCI of RCA or Circumflex should she have recurrent ACS in absence of issues with the LAD stent. PCI of the RCA or Circumflex will be very difficult given degree of calcium and tortuosity. Rotablator atherectomy of the RCA would not be favorable with severe tortuosity of vessel. Post-procedurally, the patient had nausea and sudden onset of neck pain. EKG showed anterolateral and lateral ST  depression. Re-look cath showed patent stent with no obvious mechanical complications. Her Hgb/Plt slightly decreased and was observed with no obvious bleeding. It remains stable today but she was instructed to f/u closely with PCP. Avapro and Lasix were held this admission. She was also noted to have hypoxia with and qualified for 2L home  O2 with ambulation (O2 sats 89-91% at rest RA, but 86% with ambulation). This was arranged at dc along with HHRN. She also was noted to have rate-controlled atrial fibrillation this admission but given age and need for dual anti-platelet therapy for recent coronary stent, will not plan anti-coagulation at this time, this can be discussed further as outpatient. Today the patient is feeling improved and is without SOB. Dr. Clifton James has seen and examined her and feels she is stable for discharge.  Discharge Vitals: Blood pressure 101/51, pulse 79, temperature 98.5 F (36.9 C), temperature source Oral, resp. rate 19, height 5\' 6"  (1.676 m), weight 191 lb 4.8 oz (86.773 kg), SpO2 99.00%.  Labs: Lab Results  Component Value Date   WBC 5.6 08/22/2012   HGB 9.2* 08/22/2012   HCT 27.2* 08/22/2012   MCV 85.5 08/22/2012   PLT 121* 08/22/2012     Recent Labs Lab 08/22/12 1037  NA 138  K 4.1  CL 101  CO2 28  BUN 27*  CREATININE 1.29*  CALCIUM 9.2  GLUCOSE 182*    Lab Results  Component Value Date   CHOL 116 03/21/2012   HDL 46 03/21/2012   LDLCALC 51 03/21/2012   TRIG 97 03/21/2012    Diagnostic Studies/Procedures   1. Cardiac catheterization this admission, please see full report and above for summary.  2. Dg Chest Port 1 View 08/20/2012  *RADIOLOGY REPORT*  Clinical Data: Chest pain.  PORTABLE CHEST - 1 VIEW  Comparison: 08/17/2012  Findings: Heavy mitral annulus calcification.  Atheromatous aortic arch.  Chronic interstitial changes.  Linear scarring or subsegmental atelectasis at the lung bases, left greater than right, slightly increased.  Regional bones unremarkable.  IMPRESSION:  1.  Chronic changes as above with   increase in atelectasis versus early infiltrate at the left lung base.   Original Report Authenticated By: D. Andria Rhein, MD    3.  Chest Portable 1 View 08/17/2012  *RADIOLOGY REPORT*  Clinical Data: Chest pain.  PORTABLE CHEST - 1 VIEW  Comparison: Chest x-ray 03/23/2011.   Findings: Lung volumes appear slightly low.  There is diffuse interstitial prominence throughout the lungs bilaterally, progressively increased over numerous prior examinations.  The overall appearance is only slightly worsened when compared to the most recent prior study from 03/28/2012.  No focal confluent airspace consolidation.  No pleural effusions.  No definite pulmonary edema (although this is difficult to entirely exclude given the chronic interstitial changes).  Heart size is mildly enlarged. The patient is rotated to the right on today's exam, resulting in distortion of the mediastinal contours and reduced diagnostic sensitivity and specificity for mediastinal pathology. Atherosclerosis in the thoracic aorta.  IMPRESSION: 1.  Unusual appearance of the lungs, as above, progressively worsened over numerous prior examinations.  The overall appearance is favored to represent an underlying interstitial lung disease. This could be better evaluated with follow-up non emergent high- resolution chest CT if clinically indicated. 2.  Mild cardiomegaly. 3.  Atherosclerosis.   Original Report Authenticated By: Trudie Reed, M.D.     Discharge Medications     Medication List    STOP taking these medications  furosemide 40 MG tablet  Commonly known as:  LASIX     irbesartan 150 MG tablet  Commonly known as:  AVAPRO      TAKE these medications       acetaminophen 500 MG tablet  Commonly known as:  TYLENOL  Take 1 tablet (500 mg total) by mouth every 6 (six) hours as needed for pain.     amitriptyline 10 MG tablet  Commonly known as:  ELAVIL  Take 1 tablet (10 mg total) by mouth at bedtime.     amLODipine 2.5 MG tablet  Commonly known as:  NORVASC  Take 2.5 mg by mouth daily.     ascorbic acid 500 MG tablet  Commonly known as:  VITAMIN C  Take 500 mg by mouth 2 (two) times daily. Twice daily with iron tablet     aspirin EC 81 MG tablet  Take 81 mg by mouth every morning.      cholecalciferol 1000 UNITS tablet  Commonly known as:  VITAMIN D  Take 1,000 Units by mouth daily.     econazole nitrate 1 % cream  Apply 1 application topically daily as needed. FOR FEET IRRITATION     ferrous sulfate 324 (65 FE) MG Tbec  Take 1 tablet by mouth 2 (two) times daily.     glimepiride 1 MG tablet  Commonly known as:  AMARYL  Take 1 tablet (1 mg total) by mouth daily with lunch.     hydrOXYzine 25 MG tablet  Commonly known as:  ATARAX/VISTARIL  Take 1 tablet (25 mg total) by mouth every 6 (six) hours as needed. For itching or anxiety     metoprolol 50 MG tablet  Commonly known as:  LOPRESSOR  Take 0.5 tablets (25 mg total) by mouth 2 (two) times daily.     nitroGLYCERIN 0.4 MG SL tablet  Commonly known as:  NITROSTAT  Place 1 tablet (0.4 mg total) under the tongue every 5 (five) minutes as needed for chest pain (up to 3 doses).     pantoprazole 40 MG tablet  Commonly known as:  PROTONIX  Take 1 tablet (40 mg total) by mouth 2 (two) times daily.     prasugrel 5 MG Tabs  Commonly known as:  EFFIENT  Take 5 mg by mouth daily.     rosuvastatin 10 MG tablet  Commonly known as:  CRESTOR  Take 1 tablet (10 mg total) by mouth daily.     vitamin B-12 1000 MCG tablet  Commonly known as:  CYANOCOBALAMIN  Take 1,000 mcg by mouth every morning.        Disposition   The patient will be discharged in stable condition to home. Discharge Orders   Future Appointments Provider Department Dept Phone   08/30/2012 2:00 PM Jodelle Gross, NP [facility] [place] [phone]   10/05/2012 2:15 PM Vickki Hearing, MD [facility] [phone]   11/21/2012 11:30 AM [name], MD [facility] [phone]   Future Orders Complete By Expires     Diet - low sodium heart healthy  As directed     Comments:      Diabetic Diet    Increase activity slowly  As directed     Comments:      No driving for until cleared by your  cardiologist. No lifting over 10 lbs for 2 weeks. No sexual activity for 2 weeks. Keep procedure site clean & dry. If you notice increased pain, swelling,  bleeding or pus, call/return!  You may shower, but no soaking baths/hot tubs/pools for 1 week.      Follow-up Information   Follow up with Joni Reining, NP. (08/30/12 at 2pm)    Contact information:   [facility] [place] [address] [place] [address] [phone]       Follow up with NADEL,SCOTT M, MD. (Call PCP at discharge to discuss plan for monitoring your anemia and evaluation of an abnormal chest x-ray.)    Contact information:   [address] [place] [address] [phone]         Duration of Discharge Encounter: Greater than 30 minutes including physician and PA time.  Signed, Kriste Basque Arabelle Bollig PA-C 08/22/2012, 1:19 PM

## 2012-08-22 NOTE — Progress Notes (Addendum)
SUBJECTIVE: Mild neck pain that has been persistent for last few days. No SOB.   BP 115/51  Pulse 78  Temp(Src) 98.3 F (36.8 C) (Oral)  Resp 18  Ht 5\' 6"  (1.676 m)  Wt 191 lb 4.8 oz (86.773 kg)  BMI 30.89 kg/m2  SpO2 98%  Intake/Output Summary (Last 24 hours) at 08/22/12 1610 Last data filed at 08/21/12 1738  Gross per 24 hour  Intake    240 ml  Output      0 ml  Net    240 ml    PHYSICAL EXAM General: Well developed, well nourished, in no acute distress. Alert and oriented x 3.  Psych:  Good affect, responds appropriately Neck: No JVD. No masses noted.  Lungs: Clear bilaterally with no wheezes or rhonci noted.  Heart: Irregular Abdomen: Bowel sounds are present. Soft, non-tender.  Extremities: No lower extremity edema.   LABS: Basic Metabolic Panel:  Recent Labs  96/04/54 0525  NA 137  K 3.8  CL 101  CO2 27  GLUCOSE 110*  BUN 32*  CREATININE 1.51*  CALCIUM 8.8   CBC:  Recent Labs  08/20/12 0927 08/21/12 1005  WBC 7.6 6.9  HGB 9.2* 9.5*  HCT 27.6* 28.6*  MCV 86.8 87.5  PLT 99* 117*    Current Meds: . amitriptyline  10 mg Oral QHS  . amLODipine  2.5 mg Oral Daily  . antiseptic oral rinse  15 mL Mouth Rinse BID  . aspirin EC  81 mg Oral q morning - 10a  . cholecalciferol  1,000 Units Oral Daily  . ferrous sulfate  1 tablet Oral BID  . glimepiride  1 mg Oral Q lunch  . insulin aspart  0-15 Units Subcutaneous TID WC  . metoprolol  25 mg Oral BID  . pantoprazole  40 mg Oral BID  . prasugrel  5 mg Oral Daily  . rosuvastatin  10 mg Oral q1800     ASSESSMENT AND PLAN: 79 yo WF with history of CAD s/p multiple previous LAD interventions, DM, HTN, HLD, PAD who is readmitted with NSTEMI. She had a Promus DES placed in the proximal LAD in October 2012 in the setting of a NSTEMI and her LAD was subtotally occluded proximally. She was also found at that time to have severe stenosis in the RCA and Circumflex but these vessels have been managed  medically. She has been readmitted in July 2013 and October 2013, both times with severe restenosis in the proximal LAD stented segment in the severely calcified vessel. She was treated with cutting balloon angioplasty in July 2013 and October 2013. Long discussion with pt before cath last week about consideration for CABG but given her age, she has adamantly refused to consider CABG. PCI of LAD performed 08/19/12 with placement of a DES in the mid LAD.   1. NSTEMI/CAD: s/p PCI of LAD with placement of a DES 08/19/12. She has complex CAD with moderately severe disease in RCA and Circumflex. Currently denies CP. She is on Prasugrel chronically and while the discussion over the weekend with Dr. Myrtis Ser regarding change to Plavix is noted, will plan on discharge to home today with ASA 81 mg po Qdaily and Effient 5 mg po Qdaily (she has been on this dosage of Effient for 6 months with no bleeding complications and she understands that this therapy comes with some risk of bleeding but she is willing to accept as it is the favorable therapy for her with her CAD).  Continue BB, CCB, and Statin therapies.    2. Atrial fibrillation: Rate controlled. Given age and need for dual anti-platelet therapy for recent coronary stent, will not plan anti-coagulation at this time. This can be discussed further at f/u appt.    3. CKD, stage 2: BUN/Creatinine stable. Continue to monitor.   4. DM: Continue ssi. Cont amaryl.   5. HLD: Cont crestor.   6.Hypoxia: The patient has resting hypoxia. She will need home O2.   7. Anemia: Stable. Will need repeat CBC this am before discharge.   8. HTN: Will restart amlodipine. Will not restart Avapro.   9. Dispo: D/C home today. F/U with [facility] 1-2 weeks. We will need case management consult for home O2 and ? Possible need for [facility] for several weeks for BP checks, pulse checks.      Alexandra [name]  3/24/20148:33 AM

## 2012-08-22 NOTE — Telephone Encounter (Signed)
TCM D/C 08/22/2012 End STEMI

## 2012-08-22 NOTE — Discharge Summary (Signed)
See full note this am. cdm 

## 2012-08-23 ENCOUNTER — Telehealth: Payer: Self-pay | Admitting: *Deleted

## 2012-08-23 NOTE — Telephone Encounter (Signed)
TCM patient contacted to confirm appointment and discuss any concerns.  States she is still not feeling well, however has what she needs in regards to medications and care.  Did have to change follow up appt to 4/7, as she does not have transportation.

## 2012-08-29 ENCOUNTER — Telehealth: Payer: Self-pay | Admitting: Pulmonary Disease

## 2012-08-29 ENCOUNTER — Inpatient Hospital Stay: Payer: Medicare Other | Admitting: Adult Health

## 2012-08-29 NOTE — Telephone Encounter (Signed)
Per SN---  zofran 4 mg for nausea   1 po every 6 hours prn nausea and yes restart the lasix  1 daily.   SN would like to see pt on Thursday--can use the hold slot on Thursday for her.  thanks

## 2012-08-29 NOTE — Telephone Encounter (Signed)
Pt advised to restart lasix, she states she has some meds for nausea. Pt cannot come in on thursdays, she states she can only come in on M, W, or Friday due to transportation. Please advise. Carron Curie, CMA

## 2012-08-29 NOTE — Telephone Encounter (Signed)
I spoke with the Alexandra [name] and she states she was discharged on 08-23-12 from hospital after having an MI. Alexandra [name] has a HFU today but did not feel like she could make it in so she cancelled appt. Alexandra [name] is concerned because they stopped her lasix while in the hospital and advised her to not take it at discharge. Alexandra [name] states on Saturday she noticed some welling in both ankles and feet that has gotten worse over the last 2 days. Alexandra [name] states she is keeping her feet elevated most of the day and is wearing her compression stockings as well. Alexandra [name] is concerned about not being able to take her lasix tablet and ask if she can restart this? Alexandra [name] denies any increase in SOB at this time.  Please advise. Carron Curie, CMA Allergies  Allergen Reactions  . Codeine Other (See Comments)    Reaction: severe GI pain  . Diazepam Other (See Comments)    REACTION: causes Alexandra [name] unable to wake up after taking  . Lipitor (Atorvastatin Calcium) Other (See Comments)    Reaction: causes vertigo  . Penicillins Other (See Comments)    Reaction: unknown (childhood )  . Plavix (Clopidogrel Bisulfate)     Nonresponder  . Sulfonamide Derivatives Other (See Comments)    Reaction: unknown (childhood)  . Mango Flavor Rash

## 2012-08-30 ENCOUNTER — Encounter: Payer: Medicare Other | Admitting: Adult Health

## 2012-08-30 NOTE — Telephone Encounter (Signed)
Spoke with patient she is aware. Mon April 7 @ 230pm. Patient also has appt on this day at Cardiology at 2pm but patient states she will call cardiology and have them reschedule that because she needs to come in to Dr. Kirstie [name] office. Nothing further.

## 2012-08-30 NOTE — Telephone Encounter (Signed)
Can offer 2:30 on 4-7.  thanks

## 2012-09-01 NOTE — Progress Notes (Signed)
HPI Alexandra [name] is a 79 year female patient of Dr. Dietrich Pates we are following for ongoing assessment and treatment of known history of CAD status post hospitalization at [place] on 08/18/2012 4 non-ST elevation MI. She underwent cardiac catheterization on 3/21/ 2014 with  severe stenosis in the proximal LAD stent, complex CAD with moderately severe disease of the RCA and circumflex. Coronary bypass grafting was recommended and was discussed with the patient who refused this.       She was continued on dual antiplatelet therapy with Effient add aspirin. Dr. Clifton James would only consider an attempted PCI of the RCA or circumflex should she have recurrent ACS in the absence of issues with the LAD stent. However PCI of the RCA or circumflex would be very difficult given the degree of calcium and tortuosity. Avapro Lasix were discontinued. She was also found to have atrial fibrillation which was rate control.      Not placed on anticoagulation given the need for dual antiplatelet therapy. She is here for close followup for discussion of her current symptoms. Patient is other history includes fibromyalgia, anxiety,  obesity and sarcoidosis.   She comes today with complaints of racing heart rate in the evening with pain in her throat appear she is not taking metoprolol as directed only taking 50 mg in the morning instead of 25 mg twice a day. She continues to feel her heart rate go up with minimal exertion and she is oxygen dependent. She denies any bleeding, melena, or mD edema.  Allergies  Allergen Reactions  . Codeine Other (See Comments)    Reaction: severe GI pain  . Diazepam Other (See Comments)    REACTION: causes pt unable to wake up after taking  . Lipitor (Atorvastatin Calcium) Other (See Comments)    Reaction: causes vertigo  . Penicillins Other (See Comments)    Reaction: unknown (childhood )  . Plavix (Clopidogrel Bisulfate)     Nonresponder  . Sulfonamide Derivatives Other (See  Comments)    Reaction: unknown (childhood)  . Mango Flavor Rash    Current Outpatient Prescriptions  Medication Sig Dispense Refill  . acetaminophen (TYLENOL) 500 MG tablet Take 1 tablet (500 mg total) by mouth every 6 (six) hours as needed for pain.      [name] amitriptyline (ELAVIL) 10 MG tablet Take 1 tablet (10 mg total) by mouth at bedtime.  30 tablet  11  . ascorbic acid (VITAMIN C) 500 MG tablet Take 500 mg by mouth 2 (two) times daily. Twice daily with iron tablet      . aspirin EC 81 MG tablet Take 81 mg by mouth every morning.       . cholecalciferol (VITAMIN D) 1000 UNITS tablet Take 1,000 Units by mouth daily.      [name] econazole nitrate 1 % cream Apply 1 application topically daily as needed. FOR FEET IRRITATION      . ferrous sulfate 324 (65 FE) MG TBEC Take 1 tablet by mouth 2 (two) times daily.       . furosemide (LASIX) 40 MG tablet       . glimepiride (AMARYL) 1 MG tablet Take 1 tablet (1 mg total) by mouth daily with lunch.      . hydrOXYzine (ATARAX/VISTARIL) 25 MG tablet Take 1 tablet (25 mg total) by mouth every 6 (six) hours as needed. For itching or anxiety  30 tablet  5  . irbesartan (AVAPRO) 150 MG tablet       .  metoprolol (LOPRESSOR) 50 MG tablet Take 50 mg in the AM and 25 mg (1/2) tablet in the PM  30 tablet  6  . nitroGLYCERIN (NITROSTAT) 0.4 MG SL tablet Place 1 tablet (0.4 mg total) under the tongue every 5 (five) minutes as needed for chest pain (up to 3 doses).  25 tablet  4  . pantoprazole (PROTONIX) 40 MG tablet Take 1 tablet (40 mg total) by mouth 2 (two) times daily.  60 tablet  6  . prasugrel (EFFIENT) 5 MG TABS Take 5 mg by mouth daily.      . rosuvastatin (CRESTOR) 10 MG tablet Take 1 tablet (10 mg total) by mouth daily.  30 tablet  6  . vitamin B-12 (CYANOCOBALAMIN) 1000 MCG tablet Take 1,000 mcg by mouth every morning.       No current facility-administered medications for this visit.    Past Medical History  Diagnosis Date  . Asthmatic bronchitis     . Sarcoidosis     a. 1990s of lung tx with prednisone temporarily.  [name] HTN (hypertension)   . PVD (peripheral vascular disease)     a. 06/2012 Carotid U/S: < 50% bilat ICA stenosis.  . Hyperlipemia   . DM (diabetes mellitus)   . Obesity   . GERD (gastroesophageal reflux disease)   . Diverticulosis of colon   . IBS (irritable bowel syndrome)   . DJD (degenerative joint disease)     Hip and knee pain; hip bursitis; patellar subluxation  . Vitamin D deficiency disease   . Fibromyalgia   . Anxiety     a. Guaiac neg 03/2012.  . Intermittent vertigo   . Anemia   . Venous insufficiency   . PSVT (paroxysmal supraventricular tachycardia)     a. Prior hx of palpitations - developed SVT during stress Myoview 09/2011. b. Noted on tele 03/2012.  . Valvular heart disease     a. moderately thickened/mildly calcified aortic valve, mod MR 03/20/12  . CKD (chronic kidney disease), stage II   . PAC (premature atrial contraction)   . CAD (coronary artery disease) 03/2011    a. NSTEMI 03/2011 complicated by pulm edema s/p DES to subtotal LAD. b. [place] s/p PTCA/cutting balloon to prox LAD for ISR 12/01/11 (med rx for RCA/Cx dz).  c. NSTEMI 03/2012 s/p cutting angioplasty of ISR of prox LAD 03/21/12 (PLAVIX NONRESPONDER). d. DES to LAD for ISR 07/2012 (pt refused consideration of CABG).  [name] A-fib     a. Noted 07/2012 - not on anticoag due for need for DAPT and age.  . Thrombocytopenia     a. Noted 07/2012.  . On home oxygen therapy     2L/min with ambulation as of 07/2012.    Past Surgical History  Procedure Laterality Date  . Cholecystectomy    . Tonsillectomy    . Hernia repair    . Cataract extraction    . Coronary angioplasty with stent placement  03/21/2012    [id number] of systems complete and found to be negative unless listed above  PHYSICAL EXAM BP 116/60  Pulse 81  Ht 5\' 6"  (1.676 m)  Wt 190 lb 8 oz (86.41 kg)  BMI 30.76 kg/m2  SpO2 97%  General: Well developed, well nourished, in no  acute distress Head: Eyes PERRLA, No xanthomas.   Normal cephalic and atramatic  Lungs: Mild bibasilar crackles. No wheezes. Wearing O2. Heart: HRIR S1 S2, without MRG.  Pulses are 2+ & equal.  No carotid bruit. No JVD.  No abdominal bruits. No femoral bruits. Abdomen: Bowel sounds are positive, abdomen soft and non-tender without masses or                  Hernia's noted. Msk:  Kyphosis, slow gait. Normal strength and tone for age. Extremities: No clubbing, cyanosis or edema.  DP +1 Neuro: Alert and oriented X 3. Psych:  Good affect, responds appropriately  EKG: Atrial fibrillation rate of 78 beats per minute  ASSESSMENT AND PLAN

## 2012-09-02 ENCOUNTER — Encounter: Payer: Self-pay | Admitting: Adult Health

## 2012-09-02 ENCOUNTER — Ambulatory Visit (INDEPENDENT_AMBULATORY_CARE_PROVIDER_SITE_OTHER): Payer: Medicare Other | Admitting: Adult Health

## 2012-09-02 VITALS — BP 116/60 | HR 81 | Ht 66.0 in | Wt 190.5 lb

## 2012-09-02 DIAGNOSIS — I1310 Hypertensive heart and chronic kidney disease without heart failure, with stage 1 through stage 4 chronic kidney disease, or unspecified chronic kidney disease: Secondary | ICD-10-CM

## 2012-09-02 DIAGNOSIS — R0602 Shortness of breath: Secondary | ICD-10-CM

## 2012-09-02 DIAGNOSIS — I214 Non-ST elevation (NSTEMI) myocardial infarction: Secondary | ICD-10-CM

## 2012-09-02 DIAGNOSIS — I7090 Unspecified atherosclerosis: Secondary | ICD-10-CM

## 2012-09-02 DIAGNOSIS — I4891 Unspecified atrial fibrillation: Secondary | ICD-10-CM

## 2012-09-02 DIAGNOSIS — I2510 Atherosclerotic heart disease of native coronary artery without angina pectoris: Secondary | ICD-10-CM

## 2012-09-02 MED ORDER — NITROGLYCERIN 0.4 MG SL SUBL: 0.4000 mg | SUBLINGUAL_TABLET | SUBLINGUAL | Status: DC | PRN

## 2012-09-02 MED ORDER — METOPROLOL TARTRATE 50 MG PO TABS: ORAL_TABLET | ORAL | Status: DC

## 2012-09-02 NOTE — Assessment & Plan Note (Signed)
She continues to have episodes of angina with minimal exertion usually feeling tightness in her throat and right arm. With multiple stents and CAD I explained to her that she may continue to have periods of angina. I will discontinue amlodipine, and increased her metoprolol to 50 mg in the morning and 25 mg in the evening to help with angina symptoms. She is also advised to use nitroglycerin when necessary. We will check a BMET and a CBC today on anticoagulation and post cath for evaluation of kidney function. Not certain that she would survive yet another MRI in the future with her multiple core morbidities and multiple interventions. She is aware of this and states that she is ready when the time comes for will continue to live her life fully until that time

## 2012-09-02 NOTE — Assessment & Plan Note (Signed)
Given age and need for dual antiplatelet therapy in the setting of recent stent patient is not placed on Coumadin or Xarelto. She is tolerating frontal well without evidence of bleeding or hemoptysis. CBC will be checked. Heart rate is not well controlled on this visit running in the high 80s, with subjective complaints of racing heart with minimal exertion. Also filling heart rate increased at nighttime. As stated above the patient has had metoprolol prescribed at 25 mg twice a day, but is only taking it once a day and a 50 mg dose. I increased it to 50 mg in the morning and 25 mg in the p.m.

## 2012-09-02 NOTE — Assessment & Plan Note (Signed)
Avapro has been discontinued in this setting. She continues on Lasix 40 mg daily. No evidence of fluid retention at this time or CHF

## 2012-09-02 NOTE — Patient Instructions (Addendum)
Your physician recommends that you schedule a follow-up appointment in: 3 weeks  Your physician has recommended you make the following change in your medication:  1 - Take 1 tablet = 50 mg of Metoprolol in the AM and 1/2 tablet = 25 mg in the PM 2 - STOP Amlodipine  Your physician recommends that you return for lab work in: Today

## 2012-09-02 NOTE — Progress Notes (Deleted)
Name: Alexandra [name]    DOB: 12-08-1923  Age: 79 y.o.  MR#: [id number]       PCP:  [name], MD      Insurance: Payor: MEDICARE  Plan: MEDICARE PART A AND B  Product Type: *No Product type*    CC:    Chief Complaint  Patient presents with  . Coronary Artery Disease    s/p hospitalization   PT NOTES THE CHANGE IN HER METROPROLOL TO TAKE 25MG  BID AND SHE USED TO TAKE ONE AND A HALF OF THE 50MG  BID, ALSO TOOK AVAPRO AWAY AS WELL AS LASIX, HOWEVER LUNG MD PLACED HER BACK ON LASIX A WEEK AGO, PT COMPLAINING OF FEET SWELLING  VS Filed Vitals:   09/02/12 1423  BP: 116/60  Pulse: 81  Height: 5\' 6"  (1.676 m)  Weight: 190 lb 8 oz (86.41 kg)  SpO2: 97%    Weights Current Weight  09/02/12 190 lb 8 oz (86.41 kg)  08/22/12 191 lb 4.8 oz (86.773 kg)  08/22/12 191 lb 4.8 oz (86.773 kg)    Blood Pressure  BP Readings from Last 3 Encounters:  09/02/12 116/60  08/22/12 101/51  08/22/12 101/51     Admit date:  (Not on file) Last encounter with RMR:  08/30/2012   Allergy Codeine; Diazepam; Lipitor; Penicillins; Plavix; Sulfonamide derivatives; and Mango flavor  Current Outpatient Prescriptions  Medication Sig Dispense Refill  . acetaminophen (TYLENOL) 500 MG tablet Take 1 tablet (500 mg total) by mouth every 6 (six) hours as needed for pain.      [name] amitriptyline (ELAVIL) 10 MG tablet Take 1 tablet (10 mg total) by mouth at bedtime.  30 tablet  11  . amLODipine (NORVASC) 2.5 MG tablet Take 2.5 mg by mouth daily.      [name] ascorbic acid (VITAMIN C) 500 MG tablet Take 500 mg by mouth 2 (two) times daily. Twice daily with iron tablet      . aspirin EC 81 MG tablet Take 81 mg by mouth every morning.       . cholecalciferol (VITAMIN D) 1000 UNITS tablet Take 1,000 Units by mouth daily.      [name] econazole nitrate 1 % cream Apply 1 application topically daily as needed. FOR FEET IRRITATION      . ferrous sulfate 324 (65 FE) MG TBEC Take 1 tablet by mouth 2 (two) times daily.       . furosemide  (LASIX) 40 MG tablet       . glimepiride (AMARYL) 1 MG tablet Take 1 tablet (1 mg total) by mouth daily with lunch.      . hydrOXYzine (ATARAX/VISTARIL) 25 MG tablet Take 1 tablet (25 mg total) by mouth every 6 (six) hours as needed. For itching or anxiety  30 tablet  5  . irbesartan (AVAPRO) 150 MG tablet       . metoprolol (LOPRESSOR) 50 MG tablet Take 0.5 tablets (25 mg total) by mouth 2 (two) times daily.      . nitroGLYCERIN (NITROSTAT) 0.4 MG SL tablet Place 1 tablet (0.4 mg total) under the tongue every 5 (five) minutes as needed for chest pain (up to 3 doses).  25 tablet  4  . pantoprazole (PROTONIX) 40 MG tablet Take 1 tablet (40 mg total) by mouth 2 (two) times daily.  60 tablet  6  . prasugrel (EFFIENT) 5 MG TABS Take 5 mg by mouth daily.      . rosuvastatin (CRESTOR) 10 MG tablet Take 1  tablet (10 mg total) by mouth daily.  30 tablet  6  . vitamin B-12 (CYANOCOBALAMIN) 1000 MCG tablet Take 1,000 mcg by mouth every morning.       No current facility-administered medications for this visit.    Discontinued Meds:    Medications Discontinued During This Encounter  Medication Reason  . nitroGLYCERIN (NITROSTAT) 0.4 MG SL tablet Reorder    Patient Active Problem List  Diagnosis  . Sarcoidosis  . Diabetes mellitus, type II  . VITAMIN D DEFICIENCY  . HYPERLIPIDEMIA  . Overweight  . Anemia, normocytic normochromic  . Hypertension  . PERIPHERAL VASCULAR DISEASE  . Irritable bowel syndrome  . Cerebrovascular disease  . Arteriosclerotic cardiovascular disease (ASCVD)  . DJD (degenerative joint disease)  . Paroxysmal supraventricular tachycardia  . Anxiety  . OA (osteoarthritis) of knee  . Venous insufficiency (chronic) (peripheral)  . Chronic kidney disease, stage III (moderate)  . Cardiorenal syndrome  . CAD  . Arrhythmia  . Hypoxia  . Atrial fibrillation  . Coronary atherosclerosis of native coronary artery  . NSTEMI (non-ST elevated myocardial infarction)    LABS     Component Value Date/Time   NA 138 08/22/2012 1037   NA 137 08/20/2012 0525   NA 139 08/19/2012 0558   K 4.1 08/22/2012 1037   K 3.8 08/20/2012 0525   K 4.3 08/19/2012 0558   CL 101 08/22/2012 1037   CL 101 08/20/2012 0525   CL 103 08/19/2012 0558   CO2 28 08/22/2012 1037   CO2 27 08/20/2012 0525   CO2 25 08/19/2012 0558   GLUCOSE 182* 08/22/2012 1037   GLUCOSE 110* 08/20/2012 0525   GLUCOSE 109* 08/19/2012 0558   BUN 27* 08/22/2012 1037   BUN 32* 08/20/2012 0525   BUN 22 08/19/2012 0558   CREATININE 1.29* 08/22/2012 1037   CREATININE 1.51* 08/20/2012 0525   CREATININE 1.20* 08/19/2012 0558   CREATININE 1.43* 03/24/2012 1512   CREATININE 0.98 04/20/2011 1135   CALCIUM 9.2 08/22/2012 1037   CALCIUM 8.8 08/20/2012 0525   CALCIUM 8.9 08/19/2012 0558   GFRNONAA 36* 08/22/2012 1037   GFRNONAA 30* 08/20/2012 0525   GFRNONAA 40* 08/19/2012 0558   GFRAA 42* 08/22/2012 1037   GFRAA 35* 08/20/2012 0525   GFRAA 46* 08/19/2012 0558   CMP     Component Value Date/Time   NA 138 08/22/2012 1037   K 4.1 08/22/2012 1037   CL 101 08/22/2012 1037   CO2 28 08/22/2012 1037   GLUCOSE 182* 08/22/2012 1037   BUN 27* 08/22/2012 1037   CREATININE 1.29* 08/22/2012 1037   CREATININE 1.43* 03/24/2012 1512   CALCIUM 9.2 08/22/2012 1037   PROT 7.9 03/20/2012 0254   ALBUMIN 4.2 03/20/2012 0254   AST 15 03/20/2012 0254   ALT 7 03/20/2012 0254   ALKPHOS 80 03/20/2012 0254   BILITOT 0.5 03/20/2012 0254   GFRNONAA 36* 08/22/2012 1037   GFRAA 42* 08/22/2012 1037       Component Value Date/Time   WBC 5.6 08/22/2012 1037   WBC 6.9 08/21/2012 1005   WBC 7.6 08/20/2012 0927   HGB 9.2* 08/22/2012 1037   HGB 9.5* 08/21/2012 1005   HGB 9.2* 08/20/2012 0927   HCT 27.2* 08/22/2012 1037   HCT 28.6* 08/21/2012 1005   HCT 27.6* 08/20/2012 0927   MCV 85.5 08/22/2012 1037   MCV 87.5 08/21/2012 1005   MCV 86.8 08/20/2012 0927    Lipid Panel     Component Value Date/Time   CHOL 116  03/21/2012 0031   TRIG 97 03/21/2012 0031   HDL 46  03/21/2012 0031   CHOLHDL 2.5 03/21/2012 0031   VLDL 19 03/21/2012 0031   LDLCALC 51 03/21/2012 0031    ABG No results found for this basename: phart, pco2, pco2art, po2, po2art, hco3, tco2, acidbasedef, o2sat     Lab Results  Component Value Date   TSH 4.11 08/25/2011   BNP (last 3 results) No results found for this basename: PROBNP,  in the last 8760 hours Cardiac Panel (last 3 results) No results found for this basename: CKTOTAL, CKMB, TROPONINI, RELINDX,  in the last 72 hours  Iron/TIBC/Ferritin    Component Value Date/Time   IRON 79 07/25/2012 1255     EKG Orders placed in visit on 09/02/12  . EKG 12-LEAD     Prior Assessment and Plan Problem List as of 09/02/2012     ICD-9-CM   Sarcoidosis   Diabetes mellitus, type II   Last Assessment & Plan   03/30/2012 Office Visit Written 03/30/2012 11:30 AM by Julio Sicks, NP     Metformin stopped due to renal insufficiency  Check A1C today along w/ bmet  Could not get labs drawn on 10/28 after several requests made.  Begin Amaryl 1mg  daily w/ meals.  Caution for low sugars. -pt aware      VITAMIN D DEFICIENCY   HYPERLIPIDEMIA   Last Assessment & Plan   06/13/2012 Office Visit Written 06/13/2012 10:52 PM by Kathlen Brunswick, MD     Excellent control of hyperlipidemia when last assessed 3 months ago. Patient advised that current therapy is the most expensive without any benefit for the additional expense. Nonetheless, she wishes to continue rosuvastatin.    Overweight   Last Assessment & Plan   09/23/2010 Office Visit Written 09/23/2010  2:56 PM by Rollene Rotunda, MD     She understands the need for weight loss with dietary restriction.    Anemia, normocytic normochromic   Last Assessment & Plan   06/13/2012 Office Visit Written 06/13/2012 11:00 PM by Kathlen Brunswick, MD     Possible iron deficiency. Dr. Kriste Basque is following.    Hypertension   Last Assessment & Plan   06/13/2012 Office Visit Written 06/13/2012 10:55 PM  by Kathlen Brunswick, MD     Blood pressure has been under excellent control in recent months; current medication will be continued.    PERIPHERAL VASCULAR DISEASE   Irritable bowel syndrome   Cerebrovascular disease   Last Assessment & Plan   06/13/2012 Office Visit Written 06/13/2012 10:49 PM by Kathlen Brunswick, MD     Progression in carotid artery disease between 2012 and 2013. A repeat study will be obtained.    Arteriosclerotic cardiovascular disease (ASCVD)   Last Assessment & Plan   04/11/2012 Office Visit Written 04/11/2012 12:33 PM by Jodelle Gross, NP     She has had in-stent restenosis of a previously placed stent to the proximal LAD within 3 months of placement. She has been changed from clopidogrel to prasugrel and had recent PTCA and cutting balloon angioplasty. Schedules had to place a different stent should she have more problems with in stent restenosis using a resolute integrity DES. At this time she will be treated with medication changes only. We will see her sooner than every 6 months initially as she has had a recurrence of in-stent restenosis within a very short period of time. I want to keep up with her symptoms which she  describes as throbbing pain into her neck. She is tolerant of that prasugrel without complaints of overt bleeding or tarry stools. She has had nitroglycerin available but has not needed to take it. I have reinforced the need for her to not overdo, and if she becomes more tired or feels as throbbing pain in her throat to call us. With her history, she should not waste time ignoring it or treating it herself. And has been advised to come to ER for any recurrent symptoms and possible need for repeat cardiac catheterization and placement of new stent. She verbalizes understanding.    DJD (degenerative joint disease)   Paroxysmal supraventricular tachycardia   Last Assessment & Plan   09/14/2011 Office Visit Written 09/14/2011  1:42 PM by Jodelle Gross,  NP     She has continued complaints of occasional palpitations usually associated with anxiety. Vistaril helps more than anything else. I will continue her on the  Metoprolol 75 mg BID. She will follow-up with Dr.Rothart at that time. BP is tolerating higher dose of metoprolol without syncope or dizziness.    Anxiety   OA (osteoarthritis) of knee   Venous insufficiency (chronic) (peripheral)   Chronic kidney disease, stage III (moderate)   Cardiorenal syndrome   CAD   Arrhythmia   Hypoxia   Atrial fibrillation   Coronary atherosclerosis of native coronary artery   NSTEMI (non-ST elevated myocardial infarction)       Imaging: Dg Chest Port 1 View  08/20/2012  *RADIOLOGY REPORT*  Clinical Data: Chest pain.  PORTABLE CHEST - 1 VIEW  Comparison: 08/17/2012  Findings: Heavy mitral annulus calcification.  Atheromatous aortic arch.  Chronic interstitial changes.  Linear scarring or subsegmental atelectasis at the lung bases, left greater than right, slightly increased.  Regional bones unremarkable.  IMPRESSION:  1.  Chronic changes as above with   increase in atelectasis versus early infiltrate at the left lung base.   Original Report Authenticated By: D. Andria Rhein, MD    Dg Chest Portable 1 View  08/17/2012  *RADIOLOGY REPORT*  Clinical Data: Chest pain.  PORTABLE CHEST - 1 VIEW  Comparison: Chest x-ray 03/23/2011.  Findings: Lung volumes appear slightly low.  There is diffuse interstitial prominence throughout the lungs bilaterally, progressively increased over numerous prior examinations.  The overall appearance is only slightly worsened when compared to the most recent prior study from 03/28/2012.  No focal confluent airspace consolidation.  No pleural effusions.  No definite pulmonary edema (although this is difficult to entirely exclude given the chronic interstitial changes).  Heart size is mildly enlarged. The patient is rotated to the right on today's exam, resulting in distortion of the  mediastinal contours and reduced diagnostic sensitivity and specificity for mediastinal pathology. Atherosclerosis in the thoracic aorta.  IMPRESSION: 1.  Unusual appearance of the lungs, as above, progressively worsened over numerous prior examinations.  The overall appearance is favored to represent an underlying interstitial lung disease. This could be better evaluated with follow-up non emergent high- resolution chest CT if clinically indicated. 2.  Mild cardiomegaly. 3.  Atherosclerosis.   Original Report Authenticated By: Trudie Reed, M.D.

## 2012-09-03 LAB — BRAIN NATRIURETIC PEPTIDE: Brain Natriuretic Peptide: 288.8 pg/mL — ABNORMAL HIGH (ref 0.0–100.0)

## 2012-09-05 ENCOUNTER — Encounter: Payer: Medicare Other | Admitting: Adult Health

## 2012-09-05 ENCOUNTER — Ambulatory Visit: Payer: Medicare Other | Admitting: Pulmonary Disease

## 2012-09-05 ENCOUNTER — Telehealth: Payer: Self-pay | Admitting: Pulmonary Disease

## 2012-09-05 NOTE — Telephone Encounter (Signed)
I spoke with the pt and she states she had to cancel her appt because she does not feel well enough to come in. She states at midnight she began to feel weak and shaky and this has continued. She also states she has a decreased appetite as well. Pt states she checked her vitals and they were as follows: BS 115, sats 96%, HR 74, and BP was normal per pt. Pt denies any SOB, chest pain, nausea, vomitting, headache, weakness is generalized, not in specific area. Pt saw cardiology on Friday and she states they did na exam and some lab work but she does not know the results of this. I advised the pt that she would need to be seen to evaluate her symptoms but pt refuses appt for this week. I advised the pt to contact cardiology to follow-up on labs with them and to rest, get plenty of fluids and if she does not improve or develops any of the symptoms above to call us back for OV or go to ER. Pt states understanding.Carron Curie, CMA

## 2012-09-06 ENCOUNTER — Telehealth: Payer: Self-pay | Admitting: Pulmonary Disease

## 2012-09-06 NOTE — Telephone Encounter (Signed)
Pt apt is schd for Friday at 2pm per phone note.

## 2012-09-06 NOTE — Telephone Encounter (Signed)
Per SN----ok to add pt on SN schedule for Friday at 2 pm.  thanks

## 2012-09-06 NOTE — Telephone Encounter (Signed)
I spoke with pt and she stated when she was in the hospital last month she was told she needed to f/u based on her d/c. She was suppose to f/u 09/05/12 but she was very sick and had to cancel. She does not drive and she has to have someone bring her. She will have a ride on Friday and wants to be worked in. She stated she is not getting any better and she is very nervous. She states she will "come and wait on the door step" just to see if SN if possible.  Please advise SN thanks Last OV 07/25/12

## 2012-09-06 NOTE — Telephone Encounter (Signed)
atc PT LINE BUSY X 3 WCB

## 2012-09-09 ENCOUNTER — Encounter: Payer: Self-pay | Admitting: *Deleted

## 2012-09-09 ENCOUNTER — Encounter: Payer: Self-pay | Admitting: Pulmonary Disease

## 2012-09-09 ENCOUNTER — Ambulatory Visit (INDEPENDENT_AMBULATORY_CARE_PROVIDER_SITE_OTHER): Payer: Medicare Other | Admitting: Pulmonary Disease

## 2012-09-09 VITALS — BP 120/72 | HR 85 | Temp 97.7°F | Ht 66.0 in | Wt 188.8 lb

## 2012-09-09 DIAGNOSIS — I679 Cerebrovascular disease, unspecified: Secondary | ICD-10-CM

## 2012-09-09 DIAGNOSIS — E559 Vitamin D deficiency, unspecified: Secondary | ICD-10-CM

## 2012-09-09 DIAGNOSIS — I2510 Atherosclerotic heart disease of native coronary artery without angina pectoris: Secondary | ICD-10-CM

## 2012-09-09 DIAGNOSIS — M1990 Unspecified osteoarthritis, unspecified site: Secondary | ICD-10-CM

## 2012-09-09 DIAGNOSIS — I4891 Unspecified atrial fibrillation: Secondary | ICD-10-CM

## 2012-09-09 DIAGNOSIS — M1710 Unilateral primary osteoarthritis, unspecified knee: Secondary | ICD-10-CM

## 2012-09-09 DIAGNOSIS — E785 Hyperlipidemia, unspecified: Secondary | ICD-10-CM

## 2012-09-09 DIAGNOSIS — I872 Venous insufficiency (chronic) (peripheral): Secondary | ICD-10-CM

## 2012-09-09 DIAGNOSIS — F419 Anxiety disorder, unspecified: Secondary | ICD-10-CM

## 2012-09-09 DIAGNOSIS — E663 Overweight: Secondary | ICD-10-CM

## 2012-09-09 DIAGNOSIS — I10 Essential (primary) hypertension: Secondary | ICD-10-CM

## 2012-09-09 DIAGNOSIS — I7090 Unspecified atherosclerosis: Secondary | ICD-10-CM

## 2012-09-09 DIAGNOSIS — F411 Generalized anxiety disorder: Secondary | ICD-10-CM

## 2012-09-09 DIAGNOSIS — D649 Anemia, unspecified: Secondary | ICD-10-CM

## 2012-09-09 DIAGNOSIS — M940 Chondrocostal junction syndrome [Tietze]: Secondary | ICD-10-CM

## 2012-09-09 DIAGNOSIS — E119 Type 2 diabetes mellitus without complications: Secondary | ICD-10-CM

## 2012-09-09 MED ORDER — TRAMADOL HCL 50 MG PO TABS: ORAL_TABLET | ORAL | Status: DC

## 2012-09-09 NOTE — Progress Notes (Signed)
Subjective:    Patient ID: Alexandra [name], female    DOB: 1924/02/13, 79 y.o.   MRN: [id number]  HPI 79 y/o WF here for a follow up visit... he has multiple medical problems including AB, hx inactive Sarcoid, HBP, PVD, VI, Hyperlipidemia, DM, Obesity, GERD/ Divertics/ IBS, DJD/ FM/ osteop, Anxiety, Anemia...  ~  August 25, 2011:  6mo ROV & we reviewed her interval developments & she continues to f/u w/ DrRothbart regularly...    She had nonQ-MI 10/12 & cath showed severe 3 vessel CAD w/ PTCA/ stent placed in LAD (99% occluded) + med rx w/ Plavix added; there was AK anteriorly but LVF was preserved; DrRothbart did a Myoview scan 3/13 w/ SVT developing after the infusion w/ CP & abn STsegment changes; she spont reverted to NSR w/o need for intervention; Myoview images showed scar anteroseptal area + soft tissue attenuation & minor ischemia at the apex & EF=77%;  They incr her Metoprolol to 75mg Bid...    She saw DrHarrison in [place] 1/13 for a left knee injection. LABS 3/13:  FLP- at goals on Simva40 x TG=157;  Chems- ok x BS=126 A1c=6.0 on Metform;  CBC- Hg=11.3;  TSH=4.11;  BNP=309  ~  December 07, 2011:  54mo ROV & Alexandra [name] was Adm 7/1 - 12/04/11 w/ unstable angina (pain involved her neck & throat, then across her chest to arms) and cath showed another 99% focal proxLAD stenosis within the prev stent & mod RCA & Circ disease (including a 90% mid-distal RCA lesion- felt too difficult to intervene); she had a PTCA/ cutting balloon for the LAD in stent restenosis; continue ASA/ Plavix; continue other meds including Metoprolol, Amlodipine, Avapro, Lasix, Simvastatin...  She was also noted to be ANEMIC w/ Hg in the 8-9 range, stools reported neg & she is on FeSO4;  Renal function is borderline w/ Creat in the 0.9-1.2 range & GFR 40-50 range (her Metformin was held in hosp & review of DM control has been good on MetformBid so we decided to restart at just 500mg  Qam)... Finally she notes that "my nerves are shot" and we  will rec Lorazepam 0.5mg - 1/2 to 1 tab Tid as needed...    We reviewed prob list, meds, xrays and labs> see below for updates>> CXR 7/13 showed cardiomegaly & mild vasc congestion... LABS 7/13:  Chems- BS=144 BUN=16 Creat=1.17;  CBC- Hg=8.7  ~  January 20, 2012:  6wk ROV & Alexandra [name] is here for follow up; she also brought several loose papers from the [place] for her license- I told her we needed the whole package to complete but she was quite insistent that she had to bring the separate pages to all her specialists for completion, so- whatever, we completed what she brought to [place] & returned them to her...     Known CAD w/ recent in-stent restenosis intervention> no further angina/ CP & she is stable on ASA/ Plavix- Cards also wants her to continue ProtonixBid...    We had restarted her Metformin500mg /d & repeat labs today showed BS=118...    She had mild renal insuffic w/ Creat~1.2 & GFR 40-50 range and this is stable...    She had anemia- on FeSO4 and her Hg is up 2 points from disch 8.7 ==> 10.7 w/ FE=45 (16%) today...    We gave her Ativan to try for her anxiety "my nerves are shot" & she feels this is helpful... We reviewed prob list, meds, xrays and labs> see below for updates >> LABS 8/13:  Chems- ok w/ BS=118 BUN=23 Creat=1.2 GFR=44;  CBC- improved w/ Hg=10.7. Fe=45 (sat16%)   ~  April 20, 2012:  454mo ROV & Alexandra [name] was Adm 10/20 - 03/24/12 by Cards w/ NSTEMI & cath showed in-stent restenosis LAD- s/p cutting balloon angioplasty; she also had resid dis in RCA & CIRC- continue med rx;  They also noted HBP w/ hypotension this adm, asymptomatic SVT on tele, valv heart dis w/ thickened/ calcified AoV & modMR, mod carotid stenoses, mild renal insuffic... Followed by DrRothbart & cardiology notes reviewed & Med Rec performed>>    AB, Hx Sarcoid> not currently on any meds for breathing...    HBP> on Metop50-1.5tabsBid, Amlod2.5, Avapro150, Lasix40; BP=124/56 & she is feeling ok- denies CP, palpit, ch in SOB,  edema, etc...    CAD> on ASA81, Effient5, & off Plavix; Hosp 10/13 as above, now improved; she saw CardsPA 11/13- stable, continue same meds...    ASPVD> on above meds; known bilat 50-69% ICA stenoses but she is asymptomatic w/o cerebral ischemic symptoms; she is wat too sedentary 7 denies claud symptoms...    VI> she knows to elevate legs, elim sodium, use support hose, etc...    Hyperlipid> on Cres10, off Simva due to Norvasc Rx added; we will need to f/u w/ FLP on the Cres10...    DM> on Glimep1mg , off Metform due to mild RI; BS~110 & A1c=6.3; she knows to avoid sweets, watch sugars...    Overweight> weight = 187# w/ BMI=30; we reviewed diet & exercise...    GI> GERD, Divertics, IBS> on Protonix40Bid; she denies abd pain, n/v, c/d, blood seen...    Renal Insuffic> Creat~ 1.3-1.4 range, they stopped her Metform in hosp & used Glimep1mg  at discharge...    DJD, FM> on Tylenol alone; she saw DrHarrison 2wks ago for bilat knee injections- improved...    Vit D defic> on VitD 1000u OTC; continue same...    Anxiety> on Elavil10, off Lorazepam;     Anemia> on Fe, VitC, VitB12; Hg=10.1, MCV=89, continue supplements... We reviewed prob list, meds, xrays and labs> see below for updates >>  CXR 10/13 showed stable heart size, diffuse mitral annular calcif, clear lungs, DJD in TSpine LABS 10/13:  Chems- ok w/ BUN=25-29 Creat=1.3-1.4 BS=113-116 A1c=6.3;  CBC- Hg=9.9-10.1    ~  July 25, 2012:  454mo ROV & Alexandra [name] quips "I'm still here" w/ continued cardiac problems, angina, & need for NTG rx... She notes her "nerves run away w/ me" & the Vistaril helps... She is not fond of her apt in [place] & thinking of [place]... We reviewed the following medical problems during today's office visit >>     AB, Hx Sarcoid> not currently on any meds for breathing; stable, inactive dis...    HBP> on Metop50-1.5tabsBid, Amlod2.5, Avapro150, Lasix40; BP=118/72 & she is feeling ok- denies current CP, palpit, ch in SOB, edema, etc...     CAD> on ASA81, Effient5, & off Plavix; Hosp 10/13 as above, now improved; she saw Cards[facility] 11/13- stable, continue same meds...    ASPVD> on above meds; known bilat 50-69% ICA stenoses but she is asymptomatic w/o cerebral ischemic symptoms; she is wat too sedentary & denies claud symptoms...    VI> she knows to elevate legs, elim sodium, use support hose, etc...    Hyperlipid> on Cres10, off Simva due to Norvasc Rx added; we will need to f/u w/ FLP on the Cres10...    DM> on Glimep1mg , off Metform due to mild RI; BS=85 & A1c=6.1;  we decided to decr the Glimep1mg  to 1/2 tab/d...    Overweight> weight = 188# w/ BMI=30; we reviewed diet & exercise...    GI> GERD, Divertics, IBS> on Protonix40Bid; she denies abd pain, n/v, c/d, blood seen...    Renal Insuffic> Creat~ 1.3-1.4 range, they stopped her Metform in hosp & used Glimep1mg  at discharge...    DJD, FM> on Tylenol alone; she saw DrHarrison 2wks ago for bilat knee injections- improved...    Vit D defic> on VitD 1000u OTC; continue same...    Anxiety> on Elavil10, and Hydroxyzine25 prn, off Lorazepam; states the Hydroxyzine helps...    Anemia> on Fe, VitC, VitB12; Hg=11.1, Fe=79 (27%sat); continue supplements... We reviewed prob list, meds, xrays and labs> see below for updates >>  LABS 2/14:  Chems- ok w/ Cr=1.3 BS=85 A1c=6.1;  CBC- Hg=11.1 Fe=79 (27%sat)...  ~  September 09, 2012:  6wk ROV & Alexandra [name] was once again hosp in the interim> ADM 3/19 - 08/22/12 by Cards w/ CP & NSTEMI due to severe restenosis in the prox LAD stent, s/p DES to LAD, pt has refused CABG; she is a Plavix non-respnder; she also had AFib on rate control strategy (not on anticoag), meds adjusted as indicated below...  Today she is c/o lower sternal & costochondral CWP which is sore & tender=> we discussed REST, HEAT, Tramadol tid prn...     HBP> on Metop50-decr to 1AM & 1/2PM, Lasix40, & off Amlod & Avapro; BP=120/72 & she is feeling ok- denies current CP, palpit, ch in SOB, edema,  etc...    CAD> on ASA81, Effient5 & off Plavix; Hosp 10/13 & 3/14 as noted- w/ NSTEMI & PTCA/ DES to LAD- she has refused CABG; also had AFib & they decided on rate control; she had f/u [facility] 4/14- continue duel antiplat Rx w/ ASA & Effient, meds adjusted...     AFib> she was found to have AFib 3/14 adm 7 they decided on rate control strategy as noted...    ASPVD> on above meds; known bilat 50-69% ICA stenoses but she is asymptomatic w/o cerebral ischemic symptoms; she is wat too sedentary & denies claud symptoms...    VI> she knows to elevate legs, elim sodium, use support hose, etc...    Hyperlipid> on Cres10, off Simva due to Norvasc Rx added; we will need to f/u w/ FLP on the Cres10 (it was not checked during the recent hosp)...    DM> on Glimep1mg , off Metform due to mild RI; BS=85 & A1c=6.1; we decided to decr the Glimep1mg  to 1/2 tab/d & have made this change in Epic...    Overweight> weight = 189# w/ BMI=30; we reviewed diet & exercise...    GI> GERD, Divertics, IBS> on Protonix40Bid; she denies abd pain, n/v, c/d, blood seen...    Renal Insuffic> Creat~ 1.3-1.4 range, and stable during recent hosp...    Anxiety> on Elavil10, and Hydroxyzine25 prn, off Lorazepam; states the Hydroxyzine helps...    Anemia> on Fe, VitC, VitB12; Hg= 9-10 range, Fe=79 (27%sat); continue supplements...  We reviewed prob list, meds, xrays and labs> see below for updates >>          Problem List:  Hx of ASTHMATIC BRONCHITIS, ACUTE (ICD-466.0) - uses Mucinex as needed... she has chr stable DOE w/o change... she say she walks "some" but is obviously too sedentary due to her arthritis... she has PNEUMOVAX several yrs ago.  Hx of SARCOIDOSIS (ICD-135) - initially diagnosed in 1992 w/ CXR showing diffuse ILDis, +Gallium  scan, ACE=61, & Bronch w/ bx showing non-caseating granulomas... treated w/ Pred & weaned off w/ no active disease since then... last CTChest 7/04 w/ ca++ Ao & coronaries, +mediastinal fat,  centilobular emphysema, some scarring... stable without new symptoms of cough, sputum, CP or dyspnea... ~  CXR 1/09 showed stable chr lung changes & prom right hilum... ~  CXR 2/11 showed diffuse ILDz, no adenopathy, NAD.[name]. ~  CXR 2/12 showed borderline cardiomeg, tort Ao, sl incr markings as before, NAD.[name]. ~  CXR 10/12 in hosp showed cardiomeg, calcif in Ao, vasc congestion w/ pulm edema & sm effusions... ~  CXR 7/13 in hosp showed cardiomegaly & mild vasc congestion... ~  CXR 10/13 showed stable heart size, diffuse mitral annular calcif, clear lungs, DJD in TSpine  HYPERTENSION (ICD-401.9) - controlled on meds: METOPROLOL 50mg - 1.5 tabsBid, AMLODIPINE 2.5mg /d, AVAPRO 150mg /d, LASIX 40mg /d... ~  Myoview 12/05 was neg- no ischemia, no infarct... ~  EKG 2/11 showed NSR, rate 82/min, WNL.[name]. ~  4/12:  DrHochrein increased her Metoprolol from 25Bid to 50Bid for her BP & ectopy... ~  6/12:  BP= 136/74 & denies HA, visual changes, CP, palipit, syncope, change in dyspnea, edema, etc... ~  3/13:  BP= 124/70 & she is feeling well, denies CP/ SOB/ ch in edema/ etc... ~  7/13:  BP= 124/60 & these meds were not changed during her recent hosp for angina w/ cath & PCI for LAD in-stent restenosis... ~  8/13:  BP= 130/60 & she denies CP, palpit, ch in SOB or edema... ~  11/13:  BP= 124/56 on same regimen & she denies CP, palpit, SOB, edema... ~  2/14: on Metop50-1.5tabsBid, Amlod2.5, Avapro150, Lasix40; BP=118/72 & she is feeling ok- denies current CP, palpit, ch in SOB, edema, etc. ~  4/14: on Metop50-decr to 1AM & 1/2PM, Lasix40, & off Amlod & Avapro; BP=120/72 & she is feeling ok- denies current CP, palpit, ch in SOB, edema, etc.  VALVULAR HEART DISEASE>  PALPITATIONS & Ventric Ectopy>  Eval & Rx by DrHochrein, his notes are reviewed... On METOPROLOL 75mg Bid & she uses HYDROXYZINE (Vistaril) 25mg  Prn which she says is helpful... ~  Event Monitor done 4/12 and no definite AFib encountered, just PVCs...   ~  EKG shows just some PACs, otherw neg... ~  2DEcho 10/13 showed mild LVH, norm LVF w/ EF=65-70%; AoV w/ mildly thickened & calcif leaflets; heavy mitral annular calcif, mod thickened leaflets, & modMR...  CORONARY ARTERY DISEASE >> on ASA 81mg /d & EFFIENT5mg  (off Plavix now); plus the meds above & followed by DrRothbart... ~  She had nonQ-MI 10/12 & cath showed severe 3 vessel CAD w/ PTCA/ stent placed in LAD (99% occluded) + med rx w/ Plavix added; there was AK anteriorly but LVF was preserved... ~  EKG 11/12 showed NSR, rate85, PACs, septal infarct, otherw neg... ~  DrRothbart did a Myoview scan 3/13 w/ SVT developing after the infusion w/ CP & abn STsegment changes; she spont reverted to NSR w/o need for intervention; Myoview images showed scar anteroseptal area + soft tissue attenuation & minor ischemia at the apex & EF=77%; They incr her Metoprolol to 75mg Bid... ~  7/13:  She was [facility] for unstable angina w/ cath showing 99% prox LAD in-stent restenosis- PCI w/ cutting ballon & improved; she had mod dis in RCA & Circ (including worsening 90% distal RCA stenosis- but intervention was r/o)... EKG showed NSR, PACs, rate66, otherw wnl, NAD... ~  10/13:  She was [facility] w/ NSTEMI &  cath showed in-stent restenosis LAD- s/p cutting balloon angioplasty; she also had resid dis in RCA & CIRC- continue med rx;  They also noted HBP w/ hypotension this adm, asymptomatic SVT on tele, valv heart dis w/ thickened/ calcified AoV & modMR, mod carotid stenoses, mild renal insuffic... Followed by DrRothbart & cardiology notes reviewed & Med Rec performed... ~  2/14: on ASA81, Effient5, & off Plavix; Hosp 10/13 as above, now improved; she saw CardsPA 11/13- stable, continue same meds... ~  4/14: on ASA81, Effient5 & off Plavix; Hosp 10/13 & 3/14 w/ NSTEMI & PTCA/ DES to LAD- she has refused CABG; also had AFib & they decided on rate control; she had f/u OV Cards 4/14- continue duel antiplat Rx w/ ASA & Effient, meds  adjusted.  PERIPHERAL VASCULAR DISEASE (ICD-443.9) - on ASA 81, EFFIENT5... doing well w/o claud symptoms or cerebral ischemic symptoms... ~  Art Dopplers of LE's 6/06 showed biphasic waveforms in ant tib & peroneal arts, normal ABI's... ~  CDopplers 5/12 showed mild heterogeneous plaque bilat w/ 40-59% bilat ICA stenoses... ~  CDopplers 4/13 showed mild to mod plaque bilat, 50-69% bilat ICA stenoses (lower end but R>L)...  VENOUS INSUFFICIENCY (ICD-459.81) - on low sodium diet and the Lasix... she knows to elevate legs, wear support hose, etc... ~  7/13:  She has persistent bilat LE edema & weight is similar 192# on LASIX 40mg /d   HYPERLIPIDEMIA (ICD-272.4) - on SIMVASTATIN 40mg /d... ~  FLP 1/08 showed TChol 121, TG 120, HDL 46, LDL 51... looks great- continue Simva40. ~  FLP 1/09 showed TChol 137, TG 165, HDL 37, LDL 67 ~  FLP 9/09 showed TChol 133, TG 141, HDL 47, LDL 57 ~  FLP 5/10 showed TChol 131, TG 190, HDL 46, LDL 47 ~  FLP 2/11 showed TChol 129, TG 129, HDL 52, LDL 51 ~  FLP 2/12 showed TChol 134, TG 159, HDL 49, LDL 53 ~  FLP 3/13 on Simva40 showed TChol 112, TG 157, HDL 46, LDL 35... Continue same + better low fat diet.  DM (ICD-250.00) - on diet + GLIMEPIRIDE => see below... ~  labs 1/08 showed BS=130 and HgA1c=6.1 ~  labs 1/09 showed BS= 137, HgA1c= 6.2... diet and exercise are the keys!!! ~  labs 9/09 showed BS= 119, HgA1c= 6.3 ~  labs 5/10 showed BS= 134, A1c= 6.3 ~  labs 2/11 showed BS= 122, A1c= 6.3 ~  labs 6/11 showed BS= 106 ~  Labs 2/12 showed BS= 138, A1c= 6.3 ~  Labs 3/13 on Metform500Bid showed BS= 126, A1c= 6.0.Marland KitchenMarland Kitchen Continue same, get wt down! ~  7/13:  Metformin was held 7/13 Hosp w/ Creat= 0.9-1.2 & GFR= 40-50 range; we decided to restart METFORMIN 500mg  Qam only... ~  Labs 8/13 on Metform500 showed BS= 118 ~  Labs 10/13 in hosp showed BS=113, A1c=6.3 ~  2/14: on Glimep1mg , off Metform due to mild RI; BS=85 & A1c=6.1; we decided to decr the Glimep1mg  to 1/2  tab/d. ~  4/14: she didn't decr the Glimep as prev discussed> asked to decr the Glimep1mg  to 1/2 tab Qam...  MORBID OBESITY (ICD-278.01) - prev wt 227#  & 5\' 2"  tall for a BMI=41-42...  ~  weight 1/10 = 217# ~  weight 5/10 = 215# ~  weight 9/10 = 210# ~  weight 2/11 = 210# ~  weight 6/11 = 203# ~  weight 10/11 = 203# ~  Weight 6/12 = 195# ~  Weight 3/13 = 192# ~  Weight  7/13 = 192# ~  Weight 8/13 = 188# ~  Weight 11/13 = 187# ~  Weight 2/14 = 188#  GERD (ICD-530.81) - pt was placed on PROTONIX 40mg Bid 7/13 due to reflux symptoms and Anemia... ~  She is encouraged to f/u w/ GI & she indicates that she will call DrRehman when she is ready...  DIVERTICULOSIS, COLON (ICD-562.10) - had colonoscopy by DrDBrodie 1997 that was neg x divertics... now followed in [place]...  IRRITABLE BOWEL SYNDROME (ICD-564.1)  RENAL INSUFFICIENCY >> Creat ~1.2 and GFR ~ 40-50 range...  DEGENERATIVE JOINT DISEASE, ADVANCED (ICD-715.90) - she has mod severe bilat knee pain and sees DrHarrison, Ortho for shots "every three months"... she is opposed to surgery (see his EMR notes- reviewed)...  VITAMIN D DEFICIENCY (ICD-268.9) ~  labs 5/10 showed Vit D level =13... therefore rec Vit D 50000 u weekly. ~  labs 2/11 showed Vit D level = 32... OK to change to VitD 1000 u daily.. ~  BMD done 6/11 showed TScores -1.3 in Spine, and -2.5 in left FemNeck... rec> start Alendronate 70mg /wk but she refuses Bisphos Rx "I'll take my chances"...  FIBROMYALGIA (ICD-729.1)  ANXIETY (ICD-300.00) - we prev perscribed HYDROXYZINE PAMOATE 25mg  Prn for "throat closing" symptoms... ~  7/13:  She admits that "my nerves are shot" after recent [facility] & Rx LORAZEPAM 0.5mg - 1/2 to 1 tab Tid as needed... ~  2/14:  She stopped the Ativan on her own & now using just the ELAVIL 10mg  & HYDROXYZINE 25mg  prn...  ANEMIA (ICD-285.9) - hx mild anemia w/ Hg = 10.8 Jan09 & started Fe daily at that time...  ~  labs 5/09 showed Hg= 11.7, Fe=  64 ~  labs 9/09 showed Hg= 11.4, Fe= 43... rec- take FeSO4 w/ Vit C 500mg /d. ~  labs 5/10 showed Hg= 11.3,  Fe= 50 ~  labs 2/11 showed Hg= 10.7, Fe= 36 (12%)... rec> incr Fe Vit C to Bid. ~  labs 6/11 showed Hg= 10.9, Fe= 58 (19%sat)... continue Bid Fe ~  Labs 2/12 showed Hg= 11.6, Fe= 51 (16%sat)... Continue same. ~  Labs 3/13 showed Hg= 11.3.Marland KitchenMarland Kitchen Continue FeSO4 Bid... ~  Labs during 7/13 Hosp showed Hg= 8-9 range... rec to continue FeSO4 Bid... ~  Labs 8/13 showed Hg= 10.7 and Fe= 45 (16%)... Ok to decr Fe to one daily... ~  Labs 10/13 in hosp showed Hg= 10.1 ~  2/14: on Fe, VitC, VitB12; Hg=11.1, Fe=79 (27%sat); continue supplements. ~  4/14: on Fe, VitC, VitB12; Hg= 9-10 range, Fe=79 (27%sat); continue supplements  INTERMITTENT VERTIGO (ICD-780.4) - prev eval DrWeymann w/ Meclizine Prn...   Past Surgical History  Procedure Laterality Date  . Cholecystectomy    . Tonsillectomy    . Hernia repair    . Cataract extraction    . Coronary angioplasty with stent placement  03/21/2012    Outpatient Encounter Prescriptions as of 09/09/2012  Medication Sig Dispense Refill  . acetaminophen (TYLENOL) 500 MG tablet Take 1 tablet (500 mg total) by mouth every 6 (six) hours as needed for pain.      [name] amitriptyline (ELAVIL) 10 MG tablet Take 1 tablet (10 mg total) by mouth at bedtime.  30 tablet  11  . ascorbic acid (VITAMIN C) 500 MG tablet Take 500 mg by mouth 2 (two) times daily. Twice daily with iron tablet      . aspirin EC 81 MG tablet Take 81 mg by mouth every morning.       . cholecalciferol (VITAMIN D)  1000 UNITS tablet Take 1,000 Units by mouth daily.      [name] econazole nitrate 1 % cream Apply 1 application topically daily as needed. FOR FEET IRRITATION      . ferrous sulfate 324 (65 FE) MG TBEC Take 1 tablet by mouth 2 (two) times daily.       . furosemide (LASIX) 40 MG tablet Take 40 mg by mouth daily.       [name] glimepiride (AMARYL) 1 MG tablet Take 1/2 tablet by mouth daily      .  hydrOXYzine (ATARAX/VISTARIL) 25 MG tablet Take 1 tablet (25 mg total) by mouth every 6 (six) hours as needed. For itching or anxiety  30 tablet  5  . metoprolol (LOPRESSOR) 50 MG tablet Take 50 mg in the AM and 25 mg (1/2) tablet in the PM  30 tablet  6  . nitroGLYCERIN (NITROSTAT) 0.4 MG SL tablet Place 1 tablet (0.4 mg total) under the tongue every 5 (five) minutes as needed for chest pain (up to 3 doses).  25 tablet  4  . pantoprazole (PROTONIX) 40 MG tablet Take 1 tablet (40 mg total) by mouth 2 (two) times daily.  60 tablet  6  . prasugrel (EFFIENT) 5 MG TABS Take 5 mg by mouth daily.      . rosuvastatin (CRESTOR) 10 MG tablet Take 1 tablet (10 mg total) by mouth daily.  30 tablet  6  . vitamin B-12 (CYANOCOBALAMIN) 1000 MCG tablet Take 1,000 mcg by mouth every morning.      . [DISCONTINUED] glimepiride (AMARYL) 1 MG tablet Take 1 tablet (1 mg total) by mouth daily with lunch.      . irbesartan (AVAPRO) 150 MG tablet        No facility-administered encounter medications on file as of 09/09/2012.    Allergies  Allergen Reactions  . Codeine Other (See Comments)    Reaction: severe GI pain  . Diazepam Other (See Comments)    REACTION: causes pt unable to wake up after taking  . Lipitor (Atorvastatin Calcium) Other (See Comments)    Reaction: causes vertigo  . Penicillins Other (See Comments)    Reaction: unknown (childhood )  . Plavix (Clopidogrel Bisulfate)     Nonresponder  . Sulfonamide Derivatives Other (See Comments)    Reaction: unknown (childhood)  . Mango Flavor Rash    Current Medications, Allergies, Past Medical History, Past Surgical History, Family History, and Social History were reviewed in [employer] record.   Review of Systems        See HPI - all other systems neg except as noted... The patient complains of decreased hearing, dyspnea on exertion, peripheral edema, muscle weakness, and difficulty walking.  The patient denies anorexia,  fever, weight loss, weight gain, vision loss, hoarseness, chest pain, syncope, prolonged cough, headaches, hemoptysis, abdominal pain, melena, hematochezia, severe indigestion/heartburn, hematuria, incontinence, suspicious skin lesions, transient blindness, depression, unusual weight change, abnormal bleeding, enlarged lymph nodes, and angioedema.   Objective:   Physical Exam      WD, Obese, 79 y/o WF in NAD... GENERAL:  Alert & oriented; pleasant & cooperative... she is very talkative... HEENT:  [place]/AT, Glasses, EOM-full, PERRLA, TMs-wax, NOSE-clear, THROAT-clear & wnl. NECK:  Supple w/ fairROM; no JVD; no carotid bruits; no thyromegaly or nodules palpated; no lymphadenopathy. CHEST:  decr BS bilat but clear w/o wheezing, rales or signs of consolidation... HEART:   sl irregular rhythm; without murmurs/ rubs/ or gallops detected... ABDOMEN:  Obese  w/ panniculus, soft & nontender; normal bowel sounds; no organomegaly or masses palpated. EXT:  mod arthritic changes; no varicose veins/ +venous insuffic/ tr+edema bilat NEURO:  CN's intact, no focal neuro deficits... DERM:  few ecchymoses, no rash etc...  RADIOLOGY DATA:  Reviewed in the EPIC EMR & discussed w/ the patient...  LABORATORY DATA:  Reviewed in the EPIC EMR & discussed w/ the patient...   Assessment & Plan:    CAD>  on ASA81, Effient5 & off Plavix; Hosp 10/13 & 3/14 as noted- w/ NSTEMI & PTCA/ DES to LAD- she has refused CABG; also had AFib & they decided on rate control; she had f/u OV Cards 4/14- continue duel antiplat Rx w/ ASA & Effient, meds adjusted. CWP>  We discussed rest, heat, Tramadol prn...  AB>  Breathing is stable w/o exac...  Hx Sarcoid>  CXR w/ chr changes, NAD...  HBP>  Controlled on meds, continue same, take regularly...  Palpit, AFib>  On going eval & med adjustments from DrHochrein; better on the incr Metoprolol but notes that the Vistaril really helps too...  PVD>  On ASA & Effient now w/o cerebral  ischemic symptoms; CDoppler 4/13 w/ sl progression- continue meds...  Ven Insuffic/ Edema>  We reviewed the nec sodium restriction and continue the Lasix40...  HYPERLIPID>  On Cres10 & due for f/u FLP on ret...  DM>  On Glimep1mg  & we will decr to 1/2 tab daily...  GI>  GERD, Divertics, IBS>  Stable, continue Protonix40Bid...  DJD>  Followed by DrHarrison in [place]...  Anxiety>  Off Lorazepam...    Patient's Medications  New Prescriptions   TRAMADOL (ULTRAM) 50 MG TABLET    Take 1 tablet tid prn pain .  Previous Medications   ACETAMINOPHEN (TYLENOL) 500 MG TABLET    Take 1 tablet (500 mg total) by mouth every 6 (six) hours as needed for pain.   AMITRIPTYLINE (ELAVIL) 10 MG TABLET    Take 1 tablet (10 mg total) by mouth at bedtime.   ASCORBIC ACID (VITAMIN C) 500 MG TABLET    Take 500 mg by mouth 2 (two) times daily. Twice daily with iron tablet   ASPIRIN EC 81 MG TABLET    Take 81 mg by mouth every morning.    CHOLECALCIFEROL (VITAMIN D) 1000 UNITS TABLET    Take 1,000 Units by mouth daily.   ECONAZOLE NITRATE 1 % CREAM    Apply 1 application topically daily as needed. FOR FEET IRRITATION   FERROUS SULFATE 324 (65 FE) MG TBEC    Take 1 tablet by mouth 2 (two) times daily.    FUROSEMIDE (LASIX) 40 MG TABLET    Take 40 mg by mouth daily.    HYDROXYZINE (ATARAX/VISTARIL) 25 MG TABLET    Take 1 tablet (25 mg total) by mouth every 6 (six) hours as needed. For itching or anxiety   IRBESARTAN (AVAPRO) 150 MG TABLET       METOPROLOL (LOPRESSOR) 50 MG TABLET    Take 50 mg in the AM and 25 mg (1/2) tablet in the PM   NITROGLYCERIN (NITROSTAT) 0.4 MG SL TABLET    Place 1 tablet (0.4 mg total) under the tongue every 5 (five) minutes as needed for chest pain (up to 3 doses).   PANTOPRAZOLE (PROTONIX) 40 MG TABLET    Take 1 tablet (40 mg total) by mouth 2 (two) times daily.   PRASUGREL (EFFIENT) 5 MG TABS    Take 5 mg by mouth daily.   ROSUVASTATIN (  CRESTOR) 10 MG TABLET    Take 1 tablet (10 mg  total) by mouth daily.   VITAMIN B-12 (CYANOCOBALAMIN) 1000 MCG TABLET    Take 1,000 mcg by mouth every morning.  Modified Medications   Modified Medication Previous Medication   GLIMEPIRIDE (AMARYL) 1 MG TABLET glimepiride (AMARYL) 1 MG tablet      Take 1/2 tablet by mouth daily    Take 1 tablet (1 mg total) by mouth daily with lunch.  Discontinued Medications   No medications on file

## 2012-09-09 NOTE — Patient Instructions (Addendum)
Today we updated your med list in our EPIC system...    We decided to decrease the Glimepiride 1mg  back to 1/2 tab daily in the AM...  For your chest wall pain>>    REST THE CHEST...    Apply HEAT to the area- hot towels, heating pad, etc...    Try the new TRAMADOL 50mg  up to 3 times daily as needed for pain...   You may take this with an extra strength Tylenol to potentiate it's effect...  Call for any questions...  Let's plan a follow up visit in 61month, sooner if needed for problems.Marland KitchenMarland Kitchen

## 2012-09-12 ENCOUNTER — Ambulatory Visit: Payer: Medicare Other | Admitting: Adult Health

## 2012-09-23 ENCOUNTER — Encounter: Payer: Self-pay | Admitting: Adult Health

## 2012-09-23 ENCOUNTER — Ambulatory Visit (INDEPENDENT_AMBULATORY_CARE_PROVIDER_SITE_OTHER): Payer: Medicare Other | Admitting: Adult Health

## 2012-09-23 VITALS — BP 146/78 | HR 82 | Ht 66.0 in | Wt 182.2 lb

## 2012-09-23 DIAGNOSIS — I2510 Atherosclerotic heart disease of native coronary artery without angina pectoris: Secondary | ICD-10-CM

## 2012-09-23 DIAGNOSIS — I7090 Unspecified atherosclerosis: Secondary | ICD-10-CM

## 2012-09-23 DIAGNOSIS — I4891 Unspecified atrial fibrillation: Secondary | ICD-10-CM

## 2012-09-23 DIAGNOSIS — I10 Essential (primary) hypertension: Secondary | ICD-10-CM

## 2012-09-23 NOTE — Assessment & Plan Note (Signed)
Blood pressure is currently mildly elevated. No changes in her medication.

## 2012-09-23 NOTE — Patient Instructions (Addendum)
Your physician recommends that you schedule a follow-up appointment in 3 MONTHS. 

## 2012-09-23 NOTE — Progress Notes (Deleted)
Name: Alexandra [name]    DOB: 29-Nov-1923  Age: 79 y.o.  MR#: [id number]       PCP:  [name], MD      Insurance: Payor: MEDICARE  Plan: MEDICARE PART A AND B  Product Type: *No Product type*    CC:    Chief Complaint  Patient presents with  . Coronary Artery Disease    NSTEMI March 2014  . Atrial Fibrillation    VS Filed Vitals:   09/23/12 1333  BP: 146/78  Pulse: 82  Height: 5\' 6"  (1.676 m)  Weight: 182 lb 4 oz (82.668 kg)    Weights Current Weight  09/23/12 182 lb 4 oz (82.668 kg)  09/09/12 188 lb 12.8 oz (85.639 kg)  09/02/12 190 lb 8 oz (86.41 kg)    Blood Pressure  BP Readings from Last 3 Encounters:  09/23/12 146/78  09/09/12 120/72  09/02/12 116/60     Admit date:  (Not on file) Last encounter with RMR:  09/12/2012   Allergy Codeine; Diazepam; Lipitor; Penicillins; Plavix; Sulfonamide derivatives; and Mango flavor  Current Outpatient Prescriptions  Medication Sig Dispense Refill  . acetaminophen (TYLENOL) 500 MG tablet Take 1 tablet (500 mg total) by mouth every 6 (six) hours as needed for pain.      [name] amitriptyline (ELAVIL) 10 MG tablet Take 1 tablet (10 mg total) by mouth at bedtime.  30 tablet  11  . ascorbic acid (VITAMIN C) 500 MG tablet Take 500 mg by mouth 2 (two) times daily. Twice daily with iron tablet      . aspirin EC 81 MG tablet Take 81 mg by mouth every morning.       . cholecalciferol (VITAMIN D) 1000 UNITS tablet Take 1,000 Units by mouth daily.      [name] econazole nitrate 1 % cream Apply 1 application topically daily as needed. FOR FEET IRRITATION      . ferrous sulfate 324 (65 FE) MG TBEC Take 1 tablet by mouth 2 (two) times daily.       . furosemide (LASIX) 40 MG tablet Take 40 mg by mouth daily.       [name] glimepiride (AMARYL) 1 MG tablet Take 1/2 tablet by mouth daily      . hydrOXYzine (ATARAX/VISTARIL) 25 MG tablet Take 1 tablet (25 mg total) by mouth every 6 (six) hours as needed. For itching or anxiety  30 tablet  5  . irbesartan (AVAPRO)  150 MG tablet       . metoprolol (LOPRESSOR) 50 MG tablet Take 50 mg in the AM and 25 mg (1/2) tablet in the PM  30 tablet  6  . nitroGLYCERIN (NITROSTAT) 0.4 MG SL tablet Place 1 tablet (0.4 mg total) under the tongue every 5 (five) minutes as needed for chest pain (up to 3 doses).  25 tablet  4  . pantoprazole (PROTONIX) 40 MG tablet Take 1 tablet (40 mg total) by mouth 2 (two) times daily.  60 tablet  6  . prasugrel (EFFIENT) 5 MG TABS Take 5 mg by mouth daily.      . rosuvastatin (CRESTOR) 10 MG tablet Take 1 tablet (10 mg total) by mouth daily.  30 tablet  6  . traMADol (ULTRAM) 50 MG tablet Take 1 tablet tid prn pain .  50 tablet  0  . vitamin B-12 (CYANOCOBALAMIN) 1000 MCG tablet Take 1,000 mcg by mouth every morning.       No current facility-administered medications for this visit.  Discontinued Meds:   There are no discontinued medications.  Patient Active Problem List  Diagnosis  . Sarcoidosis  . Diabetes mellitus, type II  . VITAMIN D DEFICIENCY  . HYPERLIPIDEMIA  . Overweight  . Anemia, normocytic normochromic  . Hypertension  . PERIPHERAL VASCULAR DISEASE  . Irritable bowel syndrome  . Cerebrovascular disease  . Arteriosclerotic cardiovascular disease (ASCVD)  . DJD (degenerative joint disease)  . Anxiety  . OA (osteoarthritis) of knee  . Venous insufficiency (chronic) (peripheral)  . Chronic kidney disease, stage III (moderate)  . Cardiorenal syndrome  . Arrhythmia  . Hypoxia  . Atrial fibrillation  . NSTEMI (non-ST elevated myocardial infarction)    LABS    Component Value Date/Time   NA 138 08/22/2012 1037   NA 137 08/20/2012 0525   NA 139 08/19/2012 0558   K 4.1 08/22/2012 1037   K 3.8 08/20/2012 0525   K 4.3 08/19/2012 0558   CL 101 08/22/2012 1037   CL 101 08/20/2012 0525   CL 103 08/19/2012 0558   CO2 28 08/22/2012 1037   CO2 27 08/20/2012 0525   CO2 25 08/19/2012 0558   GLUCOSE 182* 08/22/2012 1037   GLUCOSE 110* 08/20/2012 0525   GLUCOSE 109*  08/19/2012 0558   BUN 27* 08/22/2012 1037   BUN 32* 08/20/2012 0525   BUN 22 08/19/2012 0558   CREATININE 1.29* 08/22/2012 1037   CREATININE 1.51* 08/20/2012 0525   CREATININE 1.20* 08/19/2012 0558   CREATININE 1.43* 03/24/2012 1512   CREATININE 0.98 04/20/2011 1135   CALCIUM 9.2 08/22/2012 1037   CALCIUM 8.8 08/20/2012 0525   CALCIUM 8.9 08/19/2012 0558   GFRNONAA 36* 08/22/2012 1037   GFRNONAA 30* 08/20/2012 0525   GFRNONAA 40* 08/19/2012 0558   GFRAA 42* 08/22/2012 1037   GFRAA 35* 08/20/2012 0525   GFRAA 46* 08/19/2012 0558   CMP     Component Value Date/Time   NA 138 08/22/2012 1037   K 4.1 08/22/2012 1037   CL 101 08/22/2012 1037   CO2 28 08/22/2012 1037   GLUCOSE 182* 08/22/2012 1037   BUN 27* 08/22/2012 1037   CREATININE 1.29* 08/22/2012 1037   CREATININE 1.43* 03/24/2012 1512   CALCIUM 9.2 08/22/2012 1037   PROT 7.9 03/20/2012 0254   ALBUMIN 4.2 03/20/2012 0254   AST 15 03/20/2012 0254   ALT 7 03/20/2012 0254   ALKPHOS 80 03/20/2012 0254   BILITOT 0.5 03/20/2012 0254   GFRNONAA 36* 08/22/2012 1037   GFRAA 42* 08/22/2012 1037       Component Value Date/Time   WBC 5.6 08/22/2012 1037   WBC 6.9 08/21/2012 1005   WBC 7.6 08/20/2012 0927   HGB 9.2* 08/22/2012 1037   HGB 9.5* 08/21/2012 1005   HGB 9.2* 08/20/2012 0927   HCT 27.2* 08/22/2012 1037   HCT 28.6* 08/21/2012 1005   HCT 27.6* 08/20/2012 0927   MCV 85.5 08/22/2012 1037   MCV 87.5 08/21/2012 1005   MCV 86.8 08/20/2012 0927    Lipid Panel     Component Value Date/Time   CHOL 116 03/21/2012 0031   TRIG 97 03/21/2012 0031   HDL 46 03/21/2012 0031   CHOLHDL 2.5 03/21/2012 0031   VLDL 19 03/21/2012 0031   LDLCALC 51 03/21/2012 0031    ABG No results found for this basename: phart, pco2, pco2art, po2, po2art, hco3, tco2, acidbasedef, o2sat     Lab Results  Component Value Date   TSH 4.11 08/25/2011   BNP (last 3 results) No results  found for this basename: PROBNP,  in the last 8760 hours Cardiac Panel (last 3 results) No  results found for this basename: CKTOTAL, CKMB, TROPONINI, RELINDX,  in the last 72 hours  Iron/TIBC/Ferritin    Component Value Date/Time   IRON 79 07/25/2012 1255     EKG Orders placed in visit on 09/02/12  . EKG 12-LEAD     Prior Assessment and Plan Problem List as of 09/23/2012     ICD-9-CM   Sarcoidosis   Diabetes mellitus, type II   Last Assessment & Plan   03/30/2012 Office Visit Written 03/30/2012 11:30 AM by Julio Sicks, NP     Metformin stopped due to renal insufficiency  Check A1C today along w/ bmet  Could not get labs drawn on 10/28 after several requests made.  Begin Amaryl 1mg  daily w/ meals.  Caution for low sugars. -pt aware      VITAMIN D DEFICIENCY   HYPERLIPIDEMIA   Last Assessment & Plan   06/13/2012 Office Visit Written 06/13/2012 10:52 PM by Kathlen Brunswick, MD     Excellent control of hyperlipidemia when last assessed 3 months ago. Patient advised that current therapy is the most expensive without any benefit for the additional expense. Nonetheless, she wishes to continue rosuvastatin.    Overweight   Last Assessment & Plan   09/23/2010 Office Visit Written 09/23/2010  2:56 PM by Rollene Rotunda, MD     She understands the need for weight loss with dietary restriction.    Anemia, normocytic normochromic   Last Assessment & Plan   06/13/2012 Office Visit Written 06/13/2012 11:00 PM by Kathlen Brunswick, MD     Possible iron deficiency. Dr. Kriste Basque is following.    Hypertension   Last Assessment & Plan   06/13/2012 Office Visit Written 06/13/2012 10:55 PM by Kathlen Brunswick, MD     Blood pressure has been under excellent control in recent months; current medication will be continued.    PERIPHERAL VASCULAR DISEASE   Irritable bowel syndrome   Cerebrovascular disease   Last Assessment & Plan   06/13/2012 Office Visit Written 06/13/2012 10:49 PM by Kathlen Brunswick, MD     Progression in carotid artery disease between 2012 and 2013. A repeat study  will be obtained.    Arteriosclerotic cardiovascular disease (ASCVD)   Last Assessment & Plan   09/02/2012 Office Visit Written 09/02/2012  3:34 PM by Jodelle Gross, NP     She continues to have episodes of angina with minimal exertion usually feeling tightness in her throat and right arm. With multiple stents and CAD I explained to her that she may continue to have periods of angina. I will discontinue amlodipine, and increased her metoprolol to 50 mg in the morning and 25 mg in the evening to help with angina symptoms. She is also advised to use nitroglycerin when necessary. We will check a BMET and a CBC today on anticoagulation and post cath for evaluation of kidney function. Not certain that she would survive yet another MRI in the future with her multiple core morbidities and multiple interventions. She is aware of this and states that she is ready when the time comes for will continue to live her life fully until that time    DJD (degenerative joint disease)   Anxiety   OA (osteoarthritis) of knee   Venous insufficiency (chronic) (peripheral)   Chronic kidney disease, stage III (moderate)   Cardiorenal syndrome   Last Assessment & Plan  09/02/2012 Office Visit Written 09/02/2012  3:37 PM by Jodelle Gross, NP     Avapro has been discontinued in this setting. She continues on Lasix 40 mg daily. No evidence of fluid retention at this time or CHF    Arrhythmia   Hypoxia   Atrial fibrillation   Last Assessment & Plan   09/02/2012 Office Visit Written 09/02/2012  3:36 PM by Jodelle Gross, NP     Given age and need for dual antiplatelet therapy in the setting of recent stent patient is not placed on Coumadin or Xarelto. She is tolerating frontal well without evidence of bleeding or hemoptysis. CBC will be checked. Heart rate is not well controlled on this visit running in the high 80s, with subjective complaints of racing heart with minimal exertion. Also filling heart rate increased at  nighttime. As stated above the patient has had metoprolol prescribed at 25 mg twice a day, but is only taking it once a day and a 50 mg dose. I increased it to 50 mg in the morning and 25 mg in the p.m.    NSTEMI (non-ST elevated myocardial infarction)       Imaging: No results found.

## 2012-09-23 NOTE — Assessment & Plan Note (Signed)
She is rate controlled. She remains on DAPT without coumadin.

## 2012-09-23 NOTE — Assessment & Plan Note (Signed)
Doing very well with current medical therapy. No further invasive treatment or testing is planned. See herin 3 months.

## 2012-09-23 NOTE — Progress Notes (Signed)
HPI Alexandra [name] is an 79 year old female patient of Dr. Rogers Bing we are following for ongoing assessment and management of CAD, status post hospitalization in March of 2014 with a non-ST elevation MI. She was found to have severe stenosis of the proximal LAD stent, and complex CAD with moderately severe disease of the RCA and circumflex. The patient refused CABG. Per notes Dr. Clifton James would only consider an attempted PCI of the RCA or circumflex she have recurrent ACS symptoms in the absence of issues with LAD stent. He felt that the PCI of the RCA and circumflex would be very difficult given the degree of calcium and tortuosity. The patient was last seen on 09/02/2012 with complaints of racing heart and pain in her throat. She did not been taking the Toprol as directed. Her amlodipine was discontinued on last visit and her metoprolol was increased to 50 mg in morning and 25 mg in the evening. She was continued on dual antiplatelet therapy. Concerning atrial fibrillation she was not placed on Coumadin her Xarelto as she is on dual antiplatelet therapy.   She comes today without complaints of palpitations and chest pain. Has been losing wt. " I am doing pretty good." Stopped using O2 at night.  Allergies  Allergen Reactions  . Codeine Other (See Comments)    Reaction: severe GI pain  . Diazepam Other (See Comments)    REACTION: causes pt unable to wake up after taking  . Lipitor (Atorvastatin Calcium) Other (See Comments)    Reaction: causes vertigo  . Penicillins Other (See Comments)    Reaction: unknown (childhood )  . Plavix (Clopidogrel Bisulfate)     Nonresponder  . Sulfonamide Derivatives Other (See Comments)    Reaction: unknown (childhood)  . Mango Flavor Rash    Current Outpatient Prescriptions  Medication Sig Dispense Refill  . acetaminophen (TYLENOL) 500 MG tablet Take 1 tablet (500 mg total) by mouth every 6 (six) hours as needed for pain.      [name] amitriptyline (ELAVIL) 10  MG tablet Take 1 tablet (10 mg total) by mouth at bedtime.  30 tablet  11  . ascorbic acid (VITAMIN C) 500 MG tablet Take 500 mg by mouth 2 (two) times daily. Twice daily with iron tablet      . aspirin EC 81 MG tablet Take 81 mg by mouth every morning.       . cholecalciferol (VITAMIN D) 1000 UNITS tablet Take 1,000 Units by mouth daily.      [name] econazole nitrate 1 % cream Apply 1 application topically daily as needed. FOR FEET IRRITATION      . ferrous sulfate 324 (65 FE) MG TBEC Take 1 tablet by mouth 2 (two) times daily.       . furosemide (LASIX) 40 MG tablet Take 40 mg by mouth daily.       [name] glimepiride (AMARYL) 1 MG tablet Take 1/2 tablet by mouth daily      . hydrOXYzine (ATARAX/VISTARIL) 25 MG tablet Take 1 tablet (25 mg total) by mouth every 6 (six) hours as needed. For itching or anxiety  30 tablet  5  . irbesartan (AVAPRO) 150 MG tablet       . metoprolol (LOPRESSOR) 50 MG tablet Take 50 mg in the AM and 25 mg (1/2) tablet in the PM  30 tablet  6  . nitroGLYCERIN (NITROSTAT) 0.4 MG SL tablet Place 1 tablet (0.4 mg total) under the tongue every 5 (five) minutes as needed for  chest pain (up to 3 doses).  25 tablet  4  . pantoprazole (PROTONIX) 40 MG tablet Take 1 tablet (40 mg total) by mouth 2 (two) times daily.  60 tablet  6  . prasugrel (EFFIENT) 5 MG TABS Take 5 mg by mouth daily.      . rosuvastatin (CRESTOR) 10 MG tablet Take 1 tablet (10 mg total) by mouth daily.  30 tablet  6  . traMADol (ULTRAM) 50 MG tablet Take 1 tablet tid prn pain .  50 tablet  0  . vitamin B-12 (CYANOCOBALAMIN) 1000 MCG tablet Take 1,000 mcg by mouth every morning.       No current facility-administered medications for this visit.    Past Medical History  Diagnosis Date  . Asthmatic bronchitis   . Sarcoidosis     a. 1990s of lung tx with prednisone temporarily.  [name] HTN (hypertension)   . PVD (peripheral vascular disease)     a. 06/2012 Carotid U/S: < 50% bilat ICA stenosis.  . Hyperlipemia   . DM  (diabetes mellitus)   . Obesity   . GERD (gastroesophageal reflux disease)   . Diverticulosis of colon   . IBS (irritable bowel syndrome)   . DJD (degenerative joint disease)     Hip and knee pain; hip bursitis; patellar subluxation  . Vitamin D deficiency disease   . Fibromyalgia   . Anxiety     a. Guaiac neg 03/2012.  . Intermittent vertigo   . Anemia   . Venous insufficiency   . PSVT (paroxysmal supraventricular tachycardia)     a. Prior hx of palpitations - developed SVT during stress Myoview 09/2011. b. Noted on tele 03/2012.  . Valvular heart disease     a. moderately thickened/mildly calcified aortic valve, mod MR 03/20/12  . CKD (chronic kidney disease), stage II   . PAC (premature atrial contraction)   . CAD (coronary artery disease) 03/2011    a. NSTEMI 03/2011 complicated by pulm edema s/p DES to subtotal LAD. b. [place] s/p PTCA/cutting balloon to prox LAD for ISR 12/01/11 (med rx for RCA/Cx dz).  c. NSTEMI 03/2012 s/p cutting angioplasty of ISR of prox LAD 03/21/12 (PLAVIX NONRESPONDER). d. DES to LAD for ISR 07/2012 (pt refused consideration of CABG).  [name] A-fib     a. Noted 07/2012 - not on anticoag due for need for DAPT and age.  . Thrombocytopenia     a. Noted 07/2012.  . On home oxygen therapy     2L/min with ambulation as of 07/2012.    Past Surgical History  Procedure Laterality Date  . Cholecystectomy    . Tonsillectomy    . Hernia repair    . Cataract extraction    . Coronary angioplasty with stent placement  03/21/2012    [id number] of systems complete and found to be negative unless listed above  PHYSICAL EXAM BP 146/78  Pulse 82  Ht 5\' 6"  (1.676 m)  Wt 182 lb 4 oz (82.668 kg)  BMI 29.43 kg/m2  General: Well developed, well nourished, in no acute distress Head: Eyes PERRLA, No xanthomas.   Normal cephalic and atramatic  Lungs: Clear bilaterally to auscultation and percussion. Heart: HRRR S1 S2, without MRG.  Pulses are 2+ & equal.            No carotid  bruit. No JVD.  No abdominal bruits. No femoral bruits. Abdomen: Bowel sounds are positive, abdomen soft and non-tender without masses or  Hernia's noted. Msk:  Back normal, normal gait. Normal strength and tone for age. Extremities: No clubbing, cyanosis or edema.  DP +1 Neuro: Alert and oriented X 3. Psych:  Good affect, responds appropriately    ASSESSMENT AND PLAN

## 2012-09-29 ENCOUNTER — Telehealth: Payer: Self-pay | Admitting: Adult Health

## 2012-09-29 NOTE — Telephone Encounter (Signed)
Called [facility] to advise, left message with answering service, called pt to advise, pt noted she will not be home tomorrow

## 2012-09-29 NOTE — Telephone Encounter (Signed)
PATIENT STATES SHE TALKED WITH KATHRYN LAST WEEK AND WE WERE GOING TO CALL AND HAVE SOME ONE PICK UP HER OXYGEN TANK.  NO ONE HAS CALLED YET. PLEASE SEND THAT REQUEST TO Valinda Hoar [phone]

## 2012-10-05 ENCOUNTER — Ambulatory Visit: Payer: Medicare Other | Admitting: Orthopedic Surgery

## 2012-10-18 ENCOUNTER — Ambulatory Visit (INDEPENDENT_AMBULATORY_CARE_PROVIDER_SITE_OTHER): Payer: Medicare Other | Admitting: Orthopedic Surgery

## 2012-10-18 VITALS — BP 124/76 | Ht 66.0 in | Wt 184.0 lb

## 2012-10-18 DIAGNOSIS — M1990 Unspecified osteoarthritis, unspecified site: Secondary | ICD-10-CM

## 2012-10-18 NOTE — Patient Instructions (Addendum)
You have received a steroid shot. 15% of patients experience increased pain at the injection site with in the next 24 hours. This is best treated with ice and tylenol extra strength 2 tabs every 8 hours. If you are still having pain please call the office.    

## 2012-10-18 NOTE — Progress Notes (Signed)
Patient ID: Alexandra [name], female   DOB: Aug 20, 1923, 79 y.o.   MRN: [id number] Chief Complaint  Patient presents with  . Follow-up    3 month recheck bilateral knees, request injections    Knee  Injection Procedure Note  Pre-operative Diagnosis: left knee oa  Post-operative Diagnosis: same  Indications: pain  Anesthesia: ethyl chloride   Procedure Details   Verbal consent was obtained for the procedure. Time out was completed.The joint was prepped with alcohol, followed by  Ethyl chloride spray and A 20 gauge needle was inserted into the knee via lateral approach; 4ml 1% lidocaine and 1 ml of depomedrol  was then injected into the joint . The needle was removed and the area cleansed and dressed.  Complications:  None; patient tolerated the procedure well. Knee  Injection Procedure Note  Pre-operative Diagnosis: right knee oa  Post-operative Diagnosis: same  Indications: pain  Anesthesia: ethyl chloride   Procedure Details   Verbal consent was obtained for the procedure. Time out was completed.The joint was prepped with alcohol, followed by  Ethyl chloride spray and A 20 gauge needle was inserted into the knee via lateral approach; 4ml 1% lidocaine and 1 ml of depomedrol  was then injected into the joint . The needle was removed and the area cleansed and dressed.  Complications:  None; patient tolerated the procedure well.

## 2012-10-21 ENCOUNTER — Encounter: Payer: Self-pay | Admitting: Pulmonary Disease

## 2012-10-21 ENCOUNTER — Ambulatory Visit (INDEPENDENT_AMBULATORY_CARE_PROVIDER_SITE_OTHER): Payer: Medicare Other | Admitting: Pulmonary Disease

## 2012-10-21 ENCOUNTER — Other Ambulatory Visit (INDEPENDENT_AMBULATORY_CARE_PROVIDER_SITE_OTHER): Payer: Medicare Other

## 2012-10-21 VITALS — BP 120/64 | HR 68 | Temp 97.9°F | Ht 66.0 in | Wt 185.6 lb

## 2012-10-21 DIAGNOSIS — F411 Generalized anxiety disorder: Secondary | ICD-10-CM

## 2012-10-21 DIAGNOSIS — I10 Essential (primary) hypertension: Secondary | ICD-10-CM

## 2012-10-21 DIAGNOSIS — D869 Sarcoidosis, unspecified: Secondary | ICD-10-CM

## 2012-10-21 DIAGNOSIS — F419 Anxiety disorder, unspecified: Secondary | ICD-10-CM

## 2012-10-21 DIAGNOSIS — I872 Venous insufficiency (chronic) (peripheral): Secondary | ICD-10-CM

## 2012-10-21 DIAGNOSIS — E663 Overweight: Secondary | ICD-10-CM

## 2012-10-21 DIAGNOSIS — I7090 Unspecified atherosclerosis: Secondary | ICD-10-CM

## 2012-10-21 DIAGNOSIS — D649 Anemia, unspecified: Secondary | ICD-10-CM

## 2012-10-21 DIAGNOSIS — E119 Type 2 diabetes mellitus without complications: Secondary | ICD-10-CM

## 2012-10-21 DIAGNOSIS — I4891 Unspecified atrial fibrillation: Secondary | ICD-10-CM

## 2012-10-21 DIAGNOSIS — E559 Vitamin D deficiency, unspecified: Secondary | ICD-10-CM

## 2012-10-21 DIAGNOSIS — I2510 Atherosclerotic heart disease of native coronary artery without angina pectoris: Secondary | ICD-10-CM

## 2012-10-21 DIAGNOSIS — M1990 Unspecified osteoarthritis, unspecified site: Secondary | ICD-10-CM

## 2012-10-21 DIAGNOSIS — I739 Peripheral vascular disease, unspecified: Secondary | ICD-10-CM

## 2012-10-21 DIAGNOSIS — E785 Hyperlipidemia, unspecified: Secondary | ICD-10-CM

## 2012-10-21 LAB — BASIC METABOLIC PANEL
Calcium: 9.4 mg/dL (ref 8.4–10.5)
Creatinine, Ser: 1.3 mg/dL — ABNORMAL HIGH (ref 0.4–1.2)
GFR: 41.95 mL/min — ABNORMAL LOW (ref >60.00)
Glucose, Bld: 100 mg/dL — ABNORMAL HIGH (ref 70–99)
Sodium: 139 mEq/L (ref 135–145)

## 2012-10-21 LAB — HEPATIC FUNCTION PANEL
Albumin: 4.3 g/dL (ref 3.5–5.2)
Alkaline Phosphatase: 64 U/L (ref 39–117)

## 2012-10-21 LAB — CBC WITH DIFFERENTIAL/PLATELET
Basophils Absolute: 0.1 10*3/uL (ref 0.0–0.1)
Basophils Relative: 0.7 % (ref 0.0–3.0)
Eosinophils Relative: 3.6 % (ref 0.0–5.0)
HCT: 35.6 % — ABNORMAL LOW (ref 36.0–46.0)
Hemoglobin: 11.8 g/dL — ABNORMAL LOW (ref 12.0–15.0)
Lymphocytes Relative: 13.4 % (ref 12.0–46.0)
Lymphs Abs: 1.4 10*3/uL (ref 0.7–4.0)
Monocytes Relative: 4.6 % (ref 3.0–12.0)
Neutro Abs: 8 10*3/uL — ABNORMAL HIGH (ref 1.4–7.7)
RBC: 4.05 Mil/uL (ref 3.87–5.11)
RDW: 15.4 % — ABNORMAL HIGH (ref 11.5–14.6)

## 2012-10-21 LAB — IBC PANEL
Saturation Ratios: 13 % — ABNORMAL LOW (ref 20.0–50.0)
Transferrin: 230.7 mg/dL (ref 212.0–360.0)

## 2012-10-21 MED ORDER — GLIMEPIRIDE 1 MG PO TABS: ORAL_TABLET | ORAL | Status: DC

## 2012-10-21 MED ORDER — MECLIZINE HCL 25 MG PO TABS: 25.0000 mg | ORAL_TABLET | Freq: Three times a day (TID) | ORAL | Status: DC | PRN

## 2012-10-21 NOTE — Progress Notes (Addendum)
Subjective:    Patient ID: Alexandra [name], female    DOB: 1923/08/23, 79 y.o.   MRN: [id number]  HPI 79 y/o WF here for a follow up visit... he has multiple medical problems including AB, hx inactive Sarcoid, HBP, PVD, VI, Hyperlipidemia, DM, Obesity, GERD/ Divertics/ IBS, DJD/ FM/ osteop, Anxiety, Anemia...  ~  August 25, 2011:  22mo ROV & we reviewed her interval developments & she continues to f/u w/ DrRothbart regularly...    She had nonQ-MI 10/12 & cath showed severe 3 vessel CAD w/ PTCA/ stent placed in LAD (99% occluded) + med rx w/ Plavix added; there was AK anteriorly but LVF was preserved; DrRothbart did a Myoview scan 3/13 w/ SVT developing after the infusion w/ CP & abn STsegment changes; she spont reverted to NSR w/o need for intervention; Myoview images showed scar anteroseptal area + soft tissue attenuation & minor ischemia at the apex & EF=77%;  They incr her Metoprolol to 75mg Bid...    She saw DrHarrison in [place] 1/13 for a left knee injection. LABS 3/13:  FLP- at goals on Simva40 x TG=157;  Chems- ok x BS=126 A1c=6.0 on Metform;  CBC- Hg=11.3;  TSH=4.11;  BNP=309  ~  December 07, 2011:  24mo ROV & [name] was Adm 7/1 - 12/04/11 w/ unstable angina (pain involved her neck & throat, then across her chest to arms) and cath showed another 99% focal proxLAD stenosis within the prev stent & mod RCA & Circ disease (including a 90% mid-distal RCA lesion- felt too difficult to intervene); she had a PTCA/ cutting balloon for the LAD in stent restenosis; continue ASA/ Plavix; continue other meds including Metoprolol, Amlodipine, Avapro, Lasix, Simvastatin...  She was also noted to be ANEMIC w/ Hg in the 8-9 range, stools reported neg & she is on FeSO4;  Renal function is borderline w/ Creat in the 0.9-1.2 range & GFR 40-50 range (her Metformin was held in hosp & review of DM control has been good on MetformBid so we decided to restart at just 500mg  Qam)... Finally she notes that "my nerves are shot" and we  will rec Lorazepam 0.5mg - 1/2 to 1 tab Tid as needed...    We reviewed prob list, meds, xrays and labs> see below for updates>> CXR 7/13 showed cardiomegaly & mild vasc congestion... LABS 7/13:  Chems- BS=144 BUN=16 Creat=1.17;  CBC- Hg=8.7  ~  January 20, 2012:  6wk ROV & [name] is here for follow up; she also brought several loose papers from the [place] for her license- I told her we needed the whole package to complete but she was quite insistent that she had to bring the separate pages to all her specialists for completion, so- whatever, we completed what she brought to [place] & returned them to her...     Known CAD w/ recent in-stent restenosis intervention> no further angina/ CP & she is stable on ASA/ Plavix- Cards also wants her to continue ProtonixBid...    We had restarted her Metformin500mg /d & repeat labs today showed BS=118...    She had mild renal insuffic w/ Creat~1.2 & GFR 40-50 range and this is stable...    She had anemia- on FeSO4 and her Hg is up 2 points from disch 8.7 ==> 10.7 w/ FE=45 (16%) today...    We gave her Ativan to try for her anxiety "my nerves are shot" & she feels this is helpful... We reviewed prob list, meds, xrays and labs> see below for updates >> LABS 8/13:  Chems- ok w/ BS=118 BUN=23 Creat=1.2 GFR=44;  CBC- improved w/ Hg=10.7. Fe=45 (sat16%)   ~  April 20, 2012:  76mo ROV & [name] was Adm 10/20 - 03/24/12 by Cards w/ NSTEMI & cath showed in-stent restenosis LAD- s/p cutting balloon angioplasty; she also had resid dis in RCA & CIRC- continue med rx;  They also noted HBP w/ hypotension this adm, asymptomatic SVT on tele, valv heart dis w/ thickened/ calcified AoV & modMR, mod carotid stenoses, mild renal insuffic... Followed by DrRothbart & cardiology notes reviewed & Med Rec performed>>    AB, Hx Sarcoid> not currently on any meds for breathing...    HBP> on Metop50-1.5tabsBid, Amlod2.5, Avapro150, Lasix40; BP=124/56 & she is feeling ok- denies CP, palpit, ch in SOB,  edema, etc...    CAD> on ASA81, Effient5, & off Plavix; Hosp 10/13 as above, now improved; she saw Cards[facility] 11/13- stable, continue same meds...    ASPVD> on above meds; known bilat 50-69% ICA stenoses but she is asymptomatic w/o cerebral ischemic symptoms; she is wat too sedentary 7 denies claud symptoms...    VI> she knows to elevate legs, elim sodium, use support hose, etc...    Hyperlipid> on Cres10, off Simva due to Norvasc Rx added; we will need to f/u w/ FLP on the Cres10...    DM> on Glimep1mg , off Metform due to mild RI; BS~110 & A1c=6.3; she knows to avoid sweets, watch sugars...    Overweight> weight = 187# w/ BMI=30; we reviewed diet & exercise...    GI> GERD, Divertics, IBS> on Protonix40Bid; she denies abd pain, n/v, c/d, blood seen...    Renal Insuffic> Creat~ 1.3-1.4 range, they stopped her Metform in hosp & used Glimep1mg  at discharge...    DJD, FM> on Tylenol alone; she saw DrHarrison 2wks ago for bilat knee injections- improved...    Vit D defic> on VitD 1000u OTC; continue same...    Anxiety> on Elavil10, off Lorazepam;     Anemia> on Fe, VitC, VitB12; Hg=10.1, MCV=89, continue supplements... We reviewed prob list, meds, xrays and labs> see below for updates >>  CXR 10/13 showed stable heart size, diffuse mitral annular calcif, clear lungs, DJD in TSpine LABS 10/13:  Chems- ok w/ BUN=25-29 Creat=1.3-1.4 BS=113-116 A1c=6.3;  CBC- Hg=9.9-10.1    ~  July 25, 2012:  76mo ROV & [name] quips "I'm still here" w/ continued cardiac problems, angina, & need for NTG rx... She notes her "nerves run away w/ me" & the Vistaril helps... She is not fond of her apt in [place] & thinking of [place]... We reviewed the following medical problems during today's office visit >>     AB, Hx Sarcoid> not currently on any meds for breathing; stable, inactive dis...    HBP> on Metop50-1.5tabsBid, Amlod2.5, Avapro150, Lasix40; BP=118/72 & she is feeling ok- denies current CP, palpit, ch in SOB, edema, etc...     CAD> on ASA81, Effient5, & off Plavix; Hosp 10/13 as above, now improved; she saw [facility] 11/13- stable, continue same meds...    ASPVD> on above meds; known bilat 50-69% ICA stenoses but she is asymptomatic w/o cerebral ischemic symptoms; she is wat too sedentary & denies claud symptoms...    VI> she knows to elevate legs, elim sodium, use support hose, etc...    Hyperlipid> on Cres10, off Simva due to Norvasc Rx added; we will need to f/u w/ FLP on the Cres10...    DM> on Glimep1mg , off Metform due to mild RI; BS=85 & A1c=6.1;  we decided to decr the Glimep1mg  to 1/2 tab/d...    Overweight> weight = 188# w/ BMI=30; we reviewed diet & exercise...    GI> GERD, Divertics, IBS> on Protonix40Bid; she denies abd pain, n/v, c/d, blood seen...    Renal Insuffic> Creat~ 1.3-1.4 range, they stopped her Metform in hosp & used Glimep1mg  at discharge...    DJD, FM> on Tylenol alone; she saw DrHarrison 2wks ago for bilat knee injections- improved...    Vit D defic> on VitD 1000u OTC; continue same...    Anxiety> on Elavil10, and Hydroxyzine25 prn, off Lorazepam; states the Hydroxyzine helps...    Anemia> on Fe, VitC, VitB12; Hg=11.1, Fe=79 (27%sat); continue supplements... We reviewed prob list, meds, xrays and labs> see below for updates >>  LABS 2/14:  Chems- ok w/ Cr=1.3 BS=85 A1c=6.1;  CBC- Hg=11.1 Fe=79 (27%sat)...  ~  September 09, 2012:  6wk ROV & [name] was once again hosp in the interim> ADM 3/19 - 08/22/12 by Cards w/ CP & NSTEMI due to severe restenosis in the prox LAD stent, s/p DES to LAD, pt has refused CABG; she is a Plavix non-respnder; she also had AFib on rate control strategy (not on anticoag), meds adjusted as indicated below...  Today she is c/o lower sternal & costochondral CWP which is sore & tender=> we discussed REST, HEAT, Tramadol tid prn...    HBP> on Metop50-decr to 1AM & 1/2PM, Lasix40, & off Amlod & Avapro; BP=120/72 & she is feeling ok- denies current CP, palpit, ch in SOB, edema,  etc...    CAD> on ASA81, Effient5 & off Plavix; Hosp 10/13 & 3/14 as noted- w/ NSTEMI & PTCA/ DES to LAD- she has refused CABG; also had AFib & they decided on rate control; she had f/u [facility] 4/14- continue duel antiplat Rx w/ ASA & Effient, meds adjusted...     AFib> she was found to have AFib 3/14 adm 7 they decided on rate control strategy as noted...    ASPVD> on above meds; known bilat 50-69% ICA stenoses but she is asymptomatic w/o cerebral ischemic symptoms; she is wat too sedentary & denies claud symptoms...    VI> she knows to elevate legs, elim sodium, use support hose, etc...    Hyperlipid> on Cres10, off Simva due to Norvasc Rx added; we will need to f/u w/ FLP on the Cres10 (it was not checked during the recent hosp)...    DM> on Glimep1mg , off Metform due to mild RI; BS=85 & A1c=6.1; we decided to decr the Glimep1mg  to 1/2 tab/d & have made this change in Epic...    Overweight> weight = 189# w/ BMI=30; we reviewed diet & exercise...    GI> GERD, Divertics, IBS> on Protonix40Bid; she denies abd pain, n/v, c/d, blood seen...    Renal Insuffic> Creat~ 1.3-1.4 range, and stable during recent hosp...    Anxiety> on Elavil10, and Hydroxyzine25 prn, off Lorazepam; states the Hydroxyzine helps...    Anemia> on Fe, VitC, VitB12; Hg= 9-10 range, Fe=79 (27%sat); continue supplements... We reviewed prob list, meds, xrays and labs> see below for updates >>   ~  Oct 21, 2012:  6wk ROV & last visit [name] was c/o CWP for which we rec Rest/ Heat/ Tramadol; we also decided to decr her Glimep1mg  back to 1/2 tab Qam; she tells me that stopped the Tramadol as she feels it reacted on her w/ confusion etc- she states that if she takes ASA81mg  & one Tylenol325mg  that she does just fine ("  I take this at night & sleep like a baby")... She wanted me to know that she planned her funeral last week!    She had f/u w/ Cards 4/14> HBP, CAD- NSTEMI, AFib, Carotid stenosis> on ASA81 & Effient5 w/o recurrent ischemic  symptoms; on Metoprolol & Lasix, BP= 120/64 & she notes heart "a flippin & a flappin on occas"...    Lipids controlled on Cres10 w/ last FLP 10/13 showing TChol 116, TG 97, HDL 46, LDL 51    BS has remained good per pt since we decr the Glimep1mg  to 1/2 Qam; lab today shows BS= 100; continue same...    Her weight is down 3# to 186# today...    Anemia> on Fe, VitC, Vit B12, etc; Labs shows Hg= 11.8 & Fe= 42 (13%); Rec to continue Fe/ VitC etc... We reviewed prob list, meds, xrays and labs> see below for updates >>  LABS 5/14:  Chems- ok w/ BS=100, BUN=26, Cr=1.3;  CBC- ok w/ Hg=11.8 & Fe=42 (13%);  TSH=15.48  ADDENDUM>>  TSH= 15.48 & we will start SYNTHROID83mcg/d... Follow up labs 01/06/13 showed TSH= 2.80 therefore continue Levothyroid 76mcg/d...          Problem List:  Hx of ASTHMATIC BRONCHITIS, ACUTE (ICD-466.0) - uses Mucinex as needed... she has chr stable DOE w/o change... she say she walks "some" but is obviously too sedentary due to her arthritis... she has PNEUMOVAX several yrs ago.  Hx of SARCOIDOSIS (ICD-135) - initially diagnosed in 1992 w/ CXR showing diffuse ILDis, +Gallium scan, ACE=61, & Bronch w/ bx showing non-caseating granulomas... treated w/ Pred & weaned off w/ no active disease since then... last CTChest 7/04 w/ ca++ Ao & coronaries, +mediastinal fat, centilobular emphysema, some scarring... stable without new symptoms of cough, sputum, CP or dyspnea... ~  CXR 1/09 showed stable chr lung changes & prom right hilum... ~  CXR 2/11 showed diffuse ILDz, no adenopathy, NAD.[name]. ~  CXR 2/12 showed borderline cardiomeg, tort Ao, sl incr markings as before, NAD.[name]. ~  CXR 10/12 in hosp showed cardiomeg, calcif in Ao, vasc congestion w/ pulm edema & sm effusions... ~  CXR 7/13 in hosp showed cardiomegaly & mild vasc congestion... ~  CXR 10/13 showed stable heart size, diffuse mitral annular calcif, clear lungs, DJD in TSpine  HYPERTENSION (ICD-401.9) - controlled on meds: METOPROLOL  50mg - 1.5 tabsBid, AMLODIPINE 2.5mg /d, AVAPRO 150mg /d, LASIX 40mg /d... ~  Myoview 12/05 was neg- no ischemia, no infarct... ~  EKG 2/11 showed NSR, rate 82/min, WNL.[name]. ~  4/12:  DrHochrein increased her Metoprolol from 25Bid to 50Bid for her BP & ectopy... ~  6/12:  BP= 136/74 & denies HA, visual changes, CP, palipit, syncope, change in dyspnea, edema, etc... ~  3/13:  BP= 124/70 & she is feeling well, denies CP/ SOB/ ch in edema/ etc... ~  7/13:  BP= 124/60 & these meds were not changed during her recent hosp for angina w/ cath & PCI for LAD in-stent restenosis... ~  8/13:  BP= 130/60 & she denies CP, palpit, ch in SOB or edema... ~  11/13:  BP= 124/56 on same regimen & she denies CP, palpit, SOB, edema... ~  2/14: on Metop50-1.5tabsBid, Amlod2.5, Avapro150, Lasix40; BP=118/72 & she is feeling ok- denies current CP, palpit, ch in SOB, edema, etc. ~  4/14: on Metop50-decr to 1AM & 1/2PM, Lasix40, & off Amlod & Avapro; BP=120/72 & she is feeling ok- denies current CP, palpit, ch in SOB, edema, etc.  VALVULAR HEART DISEASE>  PALPITATIONS & Ventric Ectopy>  Eval & Rx by DrHochrein, his notes are reviewed... On METOPROLOL 75mg Bid & she uses HYDROXYZINE (Vistaril) 25mg  Prn which she says is helpful... ~  Event Monitor done 4/12 and no definite AFib encountered, just PVCs...  ~  EKG shows just some PACs, otherw neg... ~  2DEcho 10/13 showed mild LVH, norm LVF w/ EF=65-70%; AoV w/ mildly thickened & calcif leaflets; heavy mitral annular calcif, mod thickened leaflets, & modMR...  CORONARY ARTERY DISEASE >> on ASA 81mg /d & EFFIENT5mg  (off Plavix now); plus the meds above & followed by DrRothbart... ~  She had nonQ-MI 10/12 & cath showed severe 3 vessel CAD w/ PTCA/ stent placed in LAD (99% occluded) + med rx w/ Plavix added; there was AK anteriorly but LVF was preserved... ~  EKG 11/12 showed NSR, rate85, PACs, septal infarct, otherw neg... ~  DrRothbart did a Myoview scan 3/13 w/ SVT developing  after the infusion w/ CP & abn STsegment changes; she spont reverted to NSR w/o need for intervention; Myoview images showed scar anteroseptal area + soft tissue attenuation & minor ischemia at the apex & EF=77%; They incr her Metoprolol to 75mg Bid... ~  7/13:  She was [facility] for unstable angina w/ cath showing 99% prox LAD in-stent restenosis- PCI w/ cutting ballon & improved; she had mod dis in RCA & Circ (including worsening 90% distal RCA stenosis- but intervention was r/o)... EKG showed NSR, PACs, rate66, otherw wnl, NAD... ~  10/13:  She was [facility] w/ NSTEMI & cath showed in-stent restenosis LAD- s/p cutting balloon angioplasty; she also had resid dis in RCA & CIRC- continue med rx;  They also noted HBP w/ hypotension this adm, asymptomatic SVT on tele, valv heart dis w/ thickened/ calcified AoV & modMR, mod carotid stenoses, mild renal insuffic... Followed by DrRothbart & cardiology notes reviewed & Med Rec performed... ~  2/14: on ASA81, Effient5, & off Plavix; Hosp 10/13 as above, now improved; she saw CardsPA 11/13- stable, continue same meds... ~  4/14: on ASA81, Effient5 & off Plavix; Hosp 10/13 & 3/14 w/ NSTEMI & PTCA/ DES to LAD- she has refused CABG; also had AFib & they decided on rate control; she had f/u [facility] 4/14- continue duel antiplat Rx w/ ASA & Effient, meds adjusted.  PERIPHERAL VASCULAR DISEASE (ICD-443.9) - on ASA 81, EFFIENT5... doing well w/o claud symptoms or cerebral ischemic symptoms... ~  Art Dopplers of LE's 6/06 showed biphasic waveforms in ant tib & peroneal arts, normal ABI's... ~  CDopplers 5/12 showed mild heterogeneous plaque bilat w/ 40-59% bilat ICA stenoses... ~  CDopplers 4/13 showed mild to mod plaque bilat, 50-69% bilat ICA stenoses (lower end but R>L)...  VENOUS INSUFFICIENCY (ICD-459.81) - on low sodium diet and the Lasix... she knows to elevate legs, wear support hose, etc... ~  7/13:  She has persistent bilat LE edema & weight is similar 192# on LASIX  40mg /d   HYPERLIPIDEMIA (ICD-272.4) - on SIMVASTATIN 40mg /d... ~  FLP 1/08 showed TChol 121, TG 120, HDL 46, LDL 51... looks great- continue Simva40. ~  FLP 1/09 showed TChol 137, TG 165, HDL 37, LDL 67 ~  FLP 9/09 showed TChol 133, TG 141, HDL 47, LDL 57 ~  FLP 5/10 showed TChol 131, TG 190, HDL 46, LDL 47 ~  FLP 2/11 showed TChol 129, TG 129, HDL 52, LDL 51 ~  FLP 2/12 showed TChol 134, TG 159, HDL 49, LDL 53 ~  FLP 3/13 on Simva40 showed TChol  112, TG 157, HDL 46, LDL 35... Continue same + better low fat diet. ~  FLP 10/13 on Cres10 showed TChol 116, TG 97, HDL 46, LDL 51... Changed to Cres10 during 10/13 adm...  DM (ICD-250.00) - on diet + GLIMEPIRIDE => see below... ~  labs 1/08 showed BS=130 and HgA1c=6.1 ~  labs 1/09 showed BS= 137, HgA1c= 6.2... diet and exercise are the keys!!! ~  labs 9/09 showed BS= 119, HgA1c= 6.3 ~  labs 5/10 showed BS= 134, A1c= 6.3 ~  labs 2/11 showed BS= 122, A1c= 6.3 ~  labs 6/11 showed BS= 106 ~  Labs 2/12 showed BS= 138, A1c= 6.3 ~  Labs 3/13 on Metform500Bid showed BS= 126, A1c= 6.0.Marland KitchenMarland Kitchen Continue same, get wt down! ~  7/13:  Metformin was held 7/13 Hosp w/ Creat= 0.9-1.2 & GFR= 40-50 range; we decided to restart METFORMIN 500mg  Qam only... ~  Labs 8/13 on Metform500 showed BS= 118 ~  Labs 10/13 in hosp showed BS=113, A1c=6.3 ~  2/14: on Glimep1mg , off Metform due to mild RI; BS=85 & A1c=6.1; we decided to decr the Glimep1mg  to 1/2 tab/d. ~  4/14: she didn't decr the Glimep as prev discussed> asked to decr the Glimep1mg  to 1/2 tab Qam... ~  Labs 5/14 on Glim1mg -1/2 daily and BS=100  HYPOTHYROID >>  ~  Routine labs 5/14 showed TSH= 15.48 & we started SYNTHROID 71mcg/d... ~  Follow up Labds 8/14 on Synthroid50 showed TSH= 2.80, continue same...  MORBID OBESITY (ICD-278.01) - prev wt 227#  & 5\' 2"  tall for a BMI=41-42...  ~  weight 1/10 = 217# ~  weight 5/10 = 215# ~  weight 9/10 = 210# ~  weight 2/11 = 210# ~  weight 6/11 = 203# ~  weight 10/11 =  203# ~  Weight 6/12 = 195# ~  Weight 3/13 = 192# ~  Weight 7/13 = 192# ~  Weight 8/13 = 188# ~  Weight 11/13 = 187# ~  Weight 2/14 = 188# ~  Weight 5/14 = 186#  GERD (ICD-530.81) - pt was placed on PROTONIX 40mg Bid 7/13 due to reflux symptoms and Anemia... ~  She is encouraged to f/u w/ GI & she indicates that she will call DrRehman when she is ready...  DIVERTICULOSIS, COLON (ICD-562.10) - had colonoscopy by DrDBrodie 1997 that was neg x divertics... now followed in [place]...  IRRITABLE BOWEL SYNDROME (ICD-564.1)  RENAL INSUFFICIENCY >> Creat ~1.2 and GFR ~ 40-50 range...  DEGENERATIVE JOINT DISEASE, ADVANCED (ICD-715.90) - she has mod severe bilat knee pain and sees DrHarrison, Ortho for shots "every three months"... she is opposed to surgery (see his EMR notes- reviewed)...  VITAMIN D DEFICIENCY (ICD-268.9) ~  labs 5/10 showed Vit D level =13... therefore rec Vit D 50000 u weekly. ~  labs 2/11 showed Vit D level = 32... OK to change to VitD 1000 u daily.. ~  BMD done 6/11 showed TScores -1.3 in Spine, and -2.5 in left FemNeck... rec> start Alendronate 70mg /wk but she refuses Bisphos Rx "I'll take my chances"...  FIBROMYALGIA (ICD-729.1)  ANXIETY (ICD-300.00) - we prev perscribed HYDROXYZINE PAMOATE 25mg  Prn for "throat closing" symptoms... ~  7/13:  She admits that "my nerves are shot" after recent [facility] & Rx LORAZEPAM 0.5mg - 1/2 to 1 tab Tid as needed... ~  2/14:  She stopped the Ativan on her own & now using just the ELAVIL 10mg  & HYDROXYZINE 25mg  prn...  ANEMIA (ICD-285.9) - hx mild anemia w/ Hg = 10.8 Jan09 & started  Fe daily at that time...  ~  labs 5/09 showed Hg= 11.7, Fe= 64 ~  labs 9/09 showed Hg= 11.4, Fe= 43... rec- take FeSO4 w/ Vit C 500mg /d. ~  labs 5/10 showed Hg= 11.3,  Fe= 50 ~  labs 2/11 showed Hg= 10.7, Fe= 36 (12%)... rec> incr Fe Vit C to Bid. ~  labs 6/11 showed Hg= 10.9, Fe= 58 (19%sat)... continue Bid Fe ~  Labs 2/12 showed Hg= 11.6, Fe= 51  (16%sat)... Continue same. ~  Labs 3/13 showed Hg= 11.3.Marland KitchenMarland Kitchen Continue FeSO4 Bid... ~  Labs during 7/13 Hosp showed Hg= 8-9 range... rec to continue FeSO4 Bid... ~  Labs 8/13 showed Hg= 10.7 and Fe= 45 (16%)... Ok to decr Fe to one daily... ~  Labs 10/13 in hosp showed Hg= 10.1 ~  2/14: on Fe, VitC, VitB12; Hg=11.1, Fe=79 (27%sat); continue supplements. ~  4/14: on Fe, VitC, VitB12; Hg= 9-10 range; continue supplements... ~  5/14: on Fe, VitC, VitB12, etc; Labs shows Hg= 11.8 & Fe= 42 (13%); Rec to continue Fe/ VitC etc  INTERMITTENT VERTIGO (ICD-780.4) - prev eval DrWeymann w/ Meclizine Prn...   Past Surgical History  Procedure Laterality Date  . Cholecystectomy    . Tonsillectomy    . Hernia repair    . Cataract extraction    . Coronary angioplasty with stent placement  03/21/2012    Outpatient Encounter Prescriptions as of 10/21/2012  Medication Sig Dispense Refill  . acetaminophen (TYLENOL) 500 MG tablet Take 1 tablet (500 mg total) by mouth every 6 (six) hours as needed for pain.      [name] amitriptyline (ELAVIL) 10 MG tablet Take 1 tablet (10 mg total) by mouth at bedtime.  30 tablet  11  . ascorbic acid (VITAMIN C) 500 MG tablet Take 500 mg by mouth 2 (two) times daily. Twice daily with iron tablet      . aspirin EC 81 MG tablet Take 81 mg by mouth every morning.       . cholecalciferol (VITAMIN D) 1000 UNITS tablet Take 1,000 Units by mouth daily.      [name] econazole nitrate 1 % cream Apply 1 application topically daily as needed. FOR FEET IRRITATION      . ferrous sulfate 324 (65 FE) MG TBEC Take 1 tablet by mouth 2 (two) times daily.       . furosemide (LASIX) 40 MG tablet Take 40 mg by mouth daily.       [name] glimepiride (AMARYL) 1 MG tablet Take 1/2 tablet by mouth daily      . hydrOXYzine (ATARAX/VISTARIL) 25 MG tablet Take 1 tablet (25 mg total) by mouth every 6 (six) hours as needed. For itching or anxiety  30 tablet  5  . metoprolol (LOPRESSOR) 50 MG tablet Take 50 mg in the AM and  25 mg (1/2) tablet in the PM  30 tablet  6  . nitroGLYCERIN (NITROSTAT) 0.4 MG SL tablet Place 1 tablet (0.4 mg total) under the tongue every 5 (five) minutes as needed for chest pain (up to 3 doses).  25 tablet  4  . pantoprazole (PROTONIX) 40 MG tablet Take 1 tablet (40 mg total) by mouth 2 (two) times daily.  60 tablet  6  . prasugrel (EFFIENT) 5 MG TABS Take 5 mg by mouth daily.      . rosuvastatin (CRESTOR) 10 MG tablet Take 1 tablet (10 mg total) by mouth daily.  30 tablet  6  . vitamin B-12 (CYANOCOBALAMIN) 1000 MCG  tablet Take 1,000 mcg by mouth every morning.      . [DISCONTINUED] irbesartan (AVAPRO) 150 MG tablet       . [DISCONTINUED] traMADol (ULTRAM) 50 MG tablet Take 1 tablet tid prn pain .  50 tablet  0   No facility-administered encounter medications on file as of 10/21/2012.    Allergies  Allergen Reactions  . Codeine Other (See Comments)    Reaction: severe GI pain  . Diazepam Other (See Comments)    REACTION: causes pt unable to wake up after taking  . Lipitor (Atorvastatin Calcium) Other (See Comments)    Reaction: causes vertigo  . Penicillins Other (See Comments)    Reaction: unknown (childhood )  . Plavix (Clopidogrel Bisulfate)     Nonresponder  . Sulfonamide Derivatives Other (See Comments)    Reaction: unknown (childhood)  . Mango Flavor Rash    Current Medications, Allergies, Past Medical History, Past Surgical History, Family History, and Social History were reviewed in [employer] record.   Review of Systems        See HPI - all other systems neg except as noted... The patient complains of decreased hearing, dyspnea on exertion, peripheral edema, muscle weakness, and difficulty walking.  The patient denies anorexia, fever, weight loss, weight gain, vision loss, hoarseness, chest pain, syncope, prolonged cough, headaches, hemoptysis, abdominal pain, melena, hematochezia, severe indigestion/heartburn, hematuria, incontinence,  suspicious skin lesions, transient blindness, depression, unusual weight change, abnormal bleeding, enlarged lymph nodes, and angioedema.   Objective:   Physical Exam      WD, Obese, 79 y/o WF in NAD... GENERAL:  Alert & oriented; pleasant & cooperative... she is very talkative... HEENT:  [place]/AT, Glasses, EOM-full, PERRLA, TMs-wax, NOSE-clear, THROAT-clear & wnl. NECK:  Supple w/ fairROM; no JVD; no carotid bruits; no thyromegaly or nodules palpated; no lymphadenopathy. CHEST:  decr BS bilat but clear w/o wheezing, rales or signs of consolidation... HEART:   sl irregular rhythm; without murmurs/ rubs/ or gallops detected... ABDOMEN:  Obese w/ panniculus, soft & nontender; normal bowel sounds; no organomegaly or masses palpated. EXT:  mod arthritic changes; no varicose veins/ +venous insuffic/ tr+edema bilat NEURO:  CN's intact, no focal neuro deficits... DERM:  few ecchymoses, no rash etc...  RADIOLOGY DATA:  Reviewed in the EPIC EMR & discussed w/ the patient...  LABORATORY DATA:  Reviewed in the EPIC EMR & discussed w/ the patient...   Assessment & Plan:    CAD>  on ASA81, Effient5 & off Plavix; Hosp 10/13 & 3/14 as noted- w/ NSTEMI & PTCA/ DES to LAD- she has refused CABG; also had AFib & they decided on rate control; she had f/u [facility] 4/14- continue duel antiplat Rx w/ ASA & Effient, meds adjusted. CWP>  We discussed rest, heat, Tramadol prn...  AB>  Breathing is stable w/o exac...  Hx Sarcoid>  CXR w/ chr changes, NAD...  HBP>  Controlled on meds, continue same, take regularly...  Palpit, AFib>  On going eval & med adjustments from DrHochrein; better on the incr Metoprolol but notes that the Vistaril really helps too...  PVD>  On ASA & Effient now w/o cerebral ischemic symptoms; CDoppler 4/13 w/ sl progression- continue meds...  Ven Insuffic/ Edema>  We reviewed the nec sodium restriction and continue the Lasix40...  HYPERLIPID>  On Cres10 & due for f/u FLP on  ret...  DM>  On Glimep1mg  & we will decr to 1/2 tab daily...  Hypothyroid>  TSH noted ~15 5/14 & Synthroid  50 started...  GI>  GERD, Divertics, IBS>  Stable, continue Protonix40Bid...  DJD>  Followed by DrHarrison in [place]...  Anxiety>  Off Lorazepam...   Patient's Medications  New Prescriptions   MECLIZINE (ANTIVERT) 25 MG TABLET    Take 1 tablet (25 mg total) by mouth 3 (three) times daily as needed for dizziness.  Previous Medications   ACETAMINOPHEN (TYLENOL) 500 MG TABLET    Take 1 tablet (500 mg total) by mouth every 6 (six) hours as needed for pain.   AMITRIPTYLINE (ELAVIL) 10 MG TABLET    Take 1 tablet (10 mg total) by mouth at bedtime.   ASCORBIC ACID (VITAMIN C) 500 MG TABLET    Take 500 mg by mouth 2 (two) times daily. Twice daily with iron tablet   ASPIRIN EC 81 MG TABLET    Take 81 mg by mouth every morning.    CHOLECALCIFEROL (VITAMIN D) 1000 UNITS TABLET    Take 1,000 Units by mouth daily.   ECONAZOLE NITRATE 1 % CREAM    Apply 1 application topically daily as needed. FOR FEET IRRITATION   FERROUS SULFATE 324 (65 FE) MG TBEC    Take 1 tablet by mouth 2 (two) times daily.    FUROSEMIDE (LASIX) 40 MG TABLET    Take 40 mg by mouth daily.    HYDROXYZINE (ATARAX/VISTARIL) 25 MG TABLET    Take 1 tablet (25 mg total) by mouth every 6 (six) hours as needed. For itching or anxiety   METOPROLOL (LOPRESSOR) 50 MG TABLET    Take 50 mg in the AM and 25 mg (1/2) tablet in the PM   NITROGLYCERIN (NITROSTAT) 0.4 MG SL TABLET    Place 1 tablet (0.4 mg total) under the tongue every 5 (five) minutes as needed for chest pain (up to 3 doses).   PANTOPRAZOLE (PROTONIX) 40 MG TABLET    Take 1 tablet (40 mg total) by mouth 2 (two) times daily.   PRASUGREL (EFFIENT) 5 MG TABS    Take 5 mg by mouth daily.   ROSUVASTATIN (CRESTOR) 10 MG TABLET    Take 1 tablet (10 mg total) by mouth daily.   VITAMIN B-12 (CYANOCOBALAMIN) 1000 MCG TABLET    Take 1,000 mcg by mouth every morning.  Modified  Medications   Modified Medication Previous Medication   GLIMEPIRIDE (AMARYL) 1 MG TABLET glimepiride (AMARYL) 1 MG tablet      Take 1/2 tablet by mouth daily    Take 1/2 tablet by mouth daily  Discontinued Medications   IRBESARTAN (AVAPRO) 150 MG TABLET       TRAMADOL (ULTRAM) 50 MG TABLET    Take 1 tablet tid prn pain .

## 2012-10-21 NOTE — Patient Instructions (Addendum)
Today we updated your med list in our EPIC system...    Continue your current medications the same...    We refilled the meds uyou requested...  Today we did your follow up blood work...    We will contact you w/ the results when available...   Call for any questions...  Let's plan a follow up visit in 64mo, sooner if needed for problems.Marland KitchenMarland Kitchen

## 2012-10-25 NOTE — Telephone Encounter (Signed)
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

## 2012-10-25 NOTE — Telephone Encounter (Signed)
.  left message to have patient return my call to clarify if [facility] came and picked up her O2 tanks

## 2012-10-26 ENCOUNTER — Other Ambulatory Visit: Payer: Self-pay | Admitting: Pulmonary Disease

## 2012-10-26 MED ORDER — LEVOTHYROXINE SODIUM 50 MCG PO TABS: 50.0000 ug | ORAL_TABLET | Freq: Every day | ORAL | Status: DC

## 2012-10-31 ENCOUNTER — Telehealth: Payer: Self-pay | Admitting: *Deleted

## 2012-10-31 MED ORDER — METOPROLOL TARTRATE 50 MG PO TABS: ORAL_TABLET | ORAL | Status: DC

## 2012-10-31 NOTE — Telephone Encounter (Signed)
Refill sent into the pharmacy for quantity of 45.

## 2012-10-31 NOTE — Telephone Encounter (Signed)
PT NEEDS A REFILL CALLED IN FOR METOPROLOL WITH THE CORRECT QUANTITY, SHE HAS RUN OUT BECAUSE IT WAS ONLY CALLED IN FOR 30 DAYS

## 2012-11-21 ENCOUNTER — Ambulatory Visit: Payer: Medicare Other | Admitting: Pulmonary Disease

## 2012-11-30 ENCOUNTER — Ambulatory Visit: Payer: Medicare Other | Admitting: Adult Health

## 2012-12-07 ENCOUNTER — Ambulatory Visit (INDEPENDENT_AMBULATORY_CARE_PROVIDER_SITE_OTHER): Payer: Medicare Other | Admitting: Adult Health

## 2012-12-07 ENCOUNTER — Encounter: Payer: Self-pay | Admitting: Adult Health

## 2012-12-07 VITALS — BP 128/66 | HR 72 | Ht 66.0 in | Wt 182.4 lb

## 2012-12-07 DIAGNOSIS — I2510 Atherosclerotic heart disease of native coronary artery without angina pectoris: Secondary | ICD-10-CM

## 2012-12-07 DIAGNOSIS — I4891 Unspecified atrial fibrillation: Secondary | ICD-10-CM

## 2012-12-07 DIAGNOSIS — I7090 Unspecified atherosclerosis: Secondary | ICD-10-CM

## 2012-12-07 DIAGNOSIS — E785 Hyperlipidemia, unspecified: Secondary | ICD-10-CM

## 2012-12-07 NOTE — Assessment & Plan Note (Signed)
Heart rate is well controlled. She will continue on current medications. No changes or planned testing at this time.

## 2012-12-07 NOTE — Assessment & Plan Note (Addendum)
She is asymptomatic with the exception of some elevated HR with exertion, walking from one room to the other, but with rest, heart rate returns to normal. She will continue on DAPT. She is very sedentary. She is anxious about her heart disease and life expectancy. She has been told that she will not survive another MI.  I have reassured her that we cannot predict a future event, although she has significant disease. She wishes medical management only with close follow up. She would like to see Dr. Beulah Gandy on next visit to be established with him as well.

## 2012-12-07 NOTE — Progress Notes (Deleted)
Name: Alexandra [name]    DOB: 17-May-1924  Age: 79 y.o.  MR#: [id number]       PCP:  [name], MD      Insurance: Payor: MEDICARE / Plan: MEDICARE PART A AND B / Product Type: *No Product type* /   CC:    Chief Complaint  Patient presents with  . Atrial Fibrillation  . Coronary Artery Disease    VS Filed Vitals:   12/07/12 1453  BP: 128/66  Pulse: 72  Height: 5\' 6"  (1.676 m)  Weight: 182 lb 6.4 oz (82.736 kg)    Weights Current Weight  12/07/12 182 lb 6.4 oz (82.736 kg)  10/21/12 185 lb 9.6 oz (84.188 kg)  10/18/12 184 lb (83.462 kg)    Blood Pressure  BP Readings from Last 3 Encounters:  12/07/12 128/66  10/21/12 120/64  10/18/12 124/76     Admit date:  (Not on file) Last encounter with RMR:  11/30/2012   Allergy Codeine; Diazepam; Lipitor; Penicillins; Plavix; Sulfonamide derivatives; and Mango flavor  Current Outpatient Prescriptions  Medication Sig Dispense Refill  . acetaminophen (TYLENOL) 500 MG tablet Take 1 tablet (500 mg total) by mouth every 6 (six) hours as needed for pain.      [name] amitriptyline (ELAVIL) 10 MG tablet Take 1 tablet (10 mg total) by mouth at bedtime.  30 tablet  11  . ascorbic acid (VITAMIN C) 500 MG tablet Take 500 mg by mouth 2 (two) times daily. Twice daily with iron tablet      . aspirin EC 81 MG tablet Take 81 mg by mouth every morning.       . cholecalciferol (VITAMIN D) 1000 UNITS tablet Take 1,000 Units by mouth daily.      [name] econazole nitrate 1 % cream Apply 1 application topically daily as needed. FOR FEET IRRITATION      . ferrous sulfate 324 (65 FE) MG TBEC Take 1 tablet by mouth 2 (two) times daily.       . furosemide (LASIX) 40 MG tablet Take 40 mg by mouth daily.       [name] glimepiride (AMARYL) 1 MG tablet Take 1/2 tablet by mouth daily  15 tablet  6  . hydrOXYzine (ATARAX/VISTARIL) 25 MG tablet Take 1 tablet (25 mg total) by mouth every 6 (six) hours as needed. For itching or anxiety  30 tablet  5  . levothyroxine (SYNTHROID,  LEVOTHROID) 50 MCG tablet Take 1 tablet (50 mcg total) by mouth daily before breakfast.  30 tablet  11  . meclizine (ANTIVERT) 25 MG tablet Take 1 tablet (25 mg total) by mouth 3 (three) times daily as needed for dizziness.  50 tablet  6  . metoprolol (LOPRESSOR) 50 MG tablet Take 50 mg in the AM and 25 mg (1/2) tablet in the PM  45 tablet  6  . nitroGLYCERIN (NITROSTAT) 0.4 MG SL tablet Place 1 tablet (0.4 mg total) under the tongue every 5 (five) minutes as needed for chest pain (up to 3 doses).  25 tablet  4  . pantoprazole (PROTONIX) 40 MG tablet Take 1 tablet (40 mg total) by mouth 2 (two) times daily.  60 tablet  6  . prasugrel (EFFIENT) 5 MG TABS Take 5 mg by mouth daily.      . rosuvastatin (CRESTOR) 10 MG tablet Take 1 tablet (10 mg total) by mouth daily.  30 tablet  6  . vitamin B-12 (CYANOCOBALAMIN) 1000 MCG tablet Take 1,000 mcg by mouth every  morning.       No current facility-administered medications for this visit.    Discontinued Meds:   There are no discontinued medications.  Patient Active Problem List   Diagnosis Date Noted  . NSTEMI (non-ST elevated myocardial infarction) 08/22/2012  . Hypoxia 08/21/2012  . Atrial fibrillation 08/21/2012  . Arrhythmia 08/20/2012  . Cardiorenal syndrome 04/01/2012  . Chronic kidney disease, stage III (moderate) 03/30/2012  . Venous insufficiency (chronic) (peripheral) 12/07/2011  . OA (osteoarthritis) of knee 09/23/2011  . Anxiety 08/25/2011  . DJD (degenerative joint disease)   . Arteriosclerotic cardiovascular disease (ASCVD) 03/02/2011  . Cerebrovascular disease 09/23/2010  . VITAMIN D DEFICIENCY 02/26/2009  . Anemia, normocytic normochromic 10/18/2007  . Sarcoidosis 06/14/2007  . Diabetes mellitus, type II 06/14/2007  . HYPERLIPIDEMIA 06/14/2007  . Overweight 06/14/2007  . Hypertension 06/14/2007  . PERIPHERAL VASCULAR DISEASE 06/14/2007  . Irritable bowel syndrome 06/14/2007    LABS    Component Value Date/Time   NA  139 10/21/2012 1337   NA 138 08/22/2012 1037   NA 137 08/20/2012 0525   K 3.8 10/21/2012 1337   K 4.1 08/22/2012 1037   K 3.8 08/20/2012 0525   CL 98 10/21/2012 1337   CL 101 08/22/2012 1037   CL 101 08/20/2012 0525   CO2 30 10/21/2012 1337   CO2 28 08/22/2012 1037   CO2 27 08/20/2012 0525   GLUCOSE 100* 10/21/2012 1337   GLUCOSE 182* 08/22/2012 1037   GLUCOSE 110* 08/20/2012 0525   BUN 26* 10/21/2012 1337   BUN 27* 08/22/2012 1037   BUN 32* 08/20/2012 0525   CREATININE 1.3* 10/21/2012 1337   CREATININE 1.29* 08/22/2012 1037   CREATININE 1.51* 08/20/2012 0525   CREATININE 1.43* 03/24/2012 1512   CREATININE 0.98 04/20/2011 1135   CALCIUM 9.4 10/21/2012 1337   CALCIUM 9.2 08/22/2012 1037   CALCIUM 8.8 08/20/2012 0525   GFRNONAA 36* 08/22/2012 1037   GFRNONAA 30* 08/20/2012 0525   GFRNONAA 40* 08/19/2012 0558   GFRAA 42* 08/22/2012 1037   GFRAA 35* 08/20/2012 0525   GFRAA 46* 08/19/2012 0558   CMP     Component Value Date/Time   NA 139 10/21/2012 1337   K 3.8 10/21/2012 1337   CL 98 10/21/2012 1337   CO2 30 10/21/2012 1337   GLUCOSE 100* 10/21/2012 1337   BUN 26* 10/21/2012 1337   CREATININE 1.3* 10/21/2012 1337   CREATININE 1.43* 03/24/2012 1512   CALCIUM 9.4 10/21/2012 1337   PROT 7.8 10/21/2012 1337   ALBUMIN 4.3 10/21/2012 1337   AST 23 10/21/2012 1337   ALT 12 10/21/2012 1337   ALKPHOS 64 10/21/2012 1337   BILITOT 0.7 10/21/2012 1337   GFRNONAA 36* 08/22/2012 1037   GFRAA 42* 08/22/2012 1037       Component Value Date/Time   WBC 10.3 10/21/2012 1337   WBC 5.6 08/22/2012 1037   WBC 6.9 08/21/2012 1005   HGB 11.8* 10/21/2012 1337   HGB 9.2* 08/22/2012 1037   HGB 9.5* 08/21/2012 1005   HCT 35.6* 10/21/2012 1337   HCT 27.2* 08/22/2012 1037   HCT 28.6* 08/21/2012 1005   MCV 87.9 10/21/2012 1337   MCV 85.5 08/22/2012 1037   MCV 87.5 08/21/2012 1005    Lipid Panel     Component Value Date/Time   CHOL 116 03/21/2012 0031   TRIG 97 03/21/2012 0031   HDL 46 03/21/2012 0031   CHOLHDL 2.5 03/21/2012 0031    VLDL 19 03/21/2012 0031   LDLCALC 51 03/21/2012 0031  ABG No results found for this basename: phart, pco2, pco2art, po2, po2art, hco3, tco2, acidbasedef, o2sat     Lab Results  Component Value Date   TSH 15.48* 10/21/2012   BNP (last 3 results) No results found for this basename: PROBNP,  in the last 8760 hours Cardiac Panel (last 3 results) No results found for this basename: CKTOTAL, CKMB, TROPONINI, RELINDX,  in the last 72 hours  Iron/TIBC/Ferritin    Component Value Date/Time   IRON 42 10/21/2012 1337     EKG Orders placed in visit on 09/02/12  . EKG 12-LEAD     Prior Assessment and Plan Problem List as of 12/07/2012     Cardiovascular and Mediastinum   Hypertension   Last Assessment & Plan   09/23/2012 Office Visit Written 09/23/2012  2:13 PM by Jodelle Gross, NP     Blood pressure is currently mildly elevated. No changes in her medication.    PERIPHERAL VASCULAR DISEASE   Cerebrovascular disease   Last Assessment & Plan   06/13/2012 Office Visit Written 06/13/2012 10:49 PM by Kathlen Brunswick, MD     Progression in carotid artery disease between 2012 and 2013. A repeat study will be obtained.    Arteriosclerotic cardiovascular disease (ASCVD)   Last Assessment & Plan   09/23/2012 Office Visit Written 09/23/2012  2:10 PM by Jodelle Gross, NP     Doing very well with current medical therapy. No further invasive treatment or testing is planned. See herin 3 months.    Venous insufficiency (chronic) (peripheral)   Cardiorenal syndrome   Last Assessment & Plan   09/02/2012 Office Visit Written 09/02/2012  3:37 PM by Jodelle Gross, NP     Avapro has been discontinued in this setting. She continues on Lasix 40 mg daily. No evidence of fluid retention at this time or CHF    Arrhythmia   Atrial fibrillation   Last Assessment & Plan   09/23/2012 Office Visit Written 09/23/2012  2:12 PM by Jodelle Gross, NP     She is rate controlled. She remains on DAPT  without coumadin.    NSTEMI (non-ST elevated myocardial infarction)     Respiratory   Hypoxia     Digestive   Irritable bowel syndrome     Endocrine   Diabetes mellitus, type II   Last Assessment & Plan   03/30/2012 Office Visit Written 03/30/2012 11:30 AM by Julio Sicks, NP     Metformin stopped due to renal insufficiency  Check A1C today along w/ bmet  Could not get labs drawn on 10/28 after several requests made.  Begin Amaryl 1mg  daily w/ meals.  Caution for low sugars. -pt aware        Musculoskeletal and Integument   DJD (degenerative joint disease)   OA (osteoarthritis) of knee     Genitourinary   Chronic kidney disease, stage III (moderate)     Other   Sarcoidosis   VITAMIN D DEFICIENCY   HYPERLIPIDEMIA   Last Assessment & Plan   06/13/2012 Office Visit Written 06/13/2012 10:52 PM by Kathlen Brunswick, MD     Excellent control of hyperlipidemia when last assessed 3 months ago. Patient advised that current therapy is the most expensive without any benefit for the additional expense. Nonetheless, she wishes to continue rosuvastatin.    Overweight   Last Assessment & Plan   09/23/2010 Office Visit Written 09/23/2010  2:56 PM by Rollene Rotunda, MD     She understands  the need for weight loss with dietary restriction.    Anemia, normocytic normochromic   Last Assessment & Plan   06/13/2012 Office Visit Written 06/13/2012 11:00 PM by Kathlen Brunswick, MD     Possible iron deficiency. Dr. Kriste Basque is following.    Anxiety       Imaging: No results found.

## 2012-12-07 NOTE — Progress Notes (Signed)
HPI: Alexandra [name] is an anxious 79 year old patient of Dr. Dietrich Pates we are following for ongoing assessment and management of CAD, status post hospitalization in March of 2014 with a non-ST elevated MI. The patient was found to have severe stenosis in proximal  stent and complex CAD with moderately severe disease of the RCA and circumflex. She was offered CABG but refused. Per Dr. Clifton James, attempted PCI of the RCA or circumflex only be done if she had recurrent symptoms. He felt that PCI would be very difficult given the degree of calcium and tortuosity. She was continued on DAPT. The patient was not placed on Coumadin or Xarelto in the setting of dual antiplatelet therapy for atrial fibrillation. On last visit in April 2014 she was doing very well without complaints.   She comes today with anxiety about her life expectancy as she knows she has inoperable CAD. She complaints of heart racing when she walks, but is predominately sedentary. She denies chest pain.   Allergies  Allergen Reactions  . Codeine Other (See Comments)    Reaction: severe GI pain  . Diazepam Other (See Comments)    REACTION: causes pt unable to wake up after taking  . Lipitor (Atorvastatin Calcium) Other (See Comments)    Reaction: causes vertigo  . Penicillins Other (See Comments)    Reaction: unknown (childhood )  . Plavix (Clopidogrel Bisulfate)     Nonresponder  . Sulfonamide Derivatives Other (See Comments)    Reaction: unknown (childhood)  . Mango Flavor Rash    Current Outpatient Prescriptions  Medication Sig Dispense Refill  . acetaminophen (TYLENOL) 500 MG tablet Take 1 tablet (500 mg total) by mouth every 6 (six) hours as needed for pain.      [name] amitriptyline (ELAVIL) 10 MG tablet Take 1 tablet (10 mg total) by mouth at bedtime.  30 tablet  11  . ascorbic acid (VITAMIN C) 500 MG tablet Take 500 mg by mouth 2 (two) times daily. Twice daily with iron tablet      . aspirin EC 81 MG tablet Take 81 mg by mouth  every morning.       . cholecalciferol (VITAMIN D) 1000 UNITS tablet Take 1,000 Units by mouth daily.      [name] econazole nitrate 1 % cream Apply 1 application topically daily as needed. FOR FEET IRRITATION      . ferrous sulfate 324 (65 FE) MG TBEC Take 1 tablet by mouth 2 (two) times daily.       . furosemide (LASIX) 40 MG tablet Take 40 mg by mouth daily.       [name] glimepiride (AMARYL) 1 MG tablet Take 1/2 tablet by mouth daily  15 tablet  6  . hydrOXYzine (ATARAX/VISTARIL) 25 MG tablet Take 1 tablet (25 mg total) by mouth every 6 (six) hours as needed. For itching or anxiety  30 tablet  5  . levothyroxine (SYNTHROID, LEVOTHROID) 50 MCG tablet Take 1 tablet (50 mcg total) by mouth daily before breakfast.  30 tablet  11  . meclizine (ANTIVERT) 25 MG tablet Take 1 tablet (25 mg total) by mouth 3 (three) times daily as needed for dizziness.  50 tablet  6  . metoprolol (LOPRESSOR) 50 MG tablet Take 50 mg in the AM and 25 mg (1/2) tablet in the PM  45 tablet  6  . nitroGLYCERIN (NITROSTAT) 0.4 MG SL tablet Place 1 tablet (0.4 mg total) under the tongue every 5 (five) minutes as needed for chest pain (up  to 3 doses).  25 tablet  4  . pantoprazole (PROTONIX) 40 MG tablet Take 1 tablet (40 mg total) by mouth 2 (two) times daily.  60 tablet  6  . prasugrel (EFFIENT) 5 MG TABS Take 5 mg by mouth daily.      . rosuvastatin (CRESTOR) 10 MG tablet Take 1 tablet (10 mg total) by mouth daily.  30 tablet  6  . vitamin B-12 (CYANOCOBALAMIN) 1000 MCG tablet Take 1,000 mcg by mouth every morning.       No current facility-administered medications for this visit.    Past Medical History  Diagnosis Date  . Asthmatic bronchitis   . Sarcoidosis     a. 1990s of lung tx with prednisone temporarily.  [name] HTN (hypertension)   . PVD (peripheral vascular disease)     a. 06/2012 Carotid U/S: < 50% bilat ICA stenosis.  . Hyperlipemia   . DM (diabetes mellitus)   . Obesity   . GERD (gastroesophageal reflux disease)   .  Diverticulosis of colon   . IBS (irritable bowel syndrome)   . DJD (degenerative joint disease)     Hip and knee pain; hip bursitis; patellar subluxation  . Vitamin D deficiency disease   . Fibromyalgia   . Anxiety     a. Guaiac neg 03/2012.  . Intermittent vertigo   . Anemia   . Venous insufficiency   . PSVT (paroxysmal supraventricular tachycardia)     a. Prior hx of palpitations - developed SVT during stress Myoview 09/2011. b. Noted on tele 03/2012.  . Valvular heart disease     a. moderately thickened/mildly calcified aortic valve, mod MR 03/20/12  . CKD (chronic kidney disease), stage II   . PAC (premature atrial contraction)   . CAD (coronary artery disease) 03/2011    a. NSTEMI 03/2011 complicated by pulm edema s/p DES to subtotal LAD. b. [place] s/p PTCA/cutting balloon to prox LAD for ISR 12/01/11 (med rx for RCA/Cx dz).  c. NSTEMI 03/2012 s/p cutting angioplasty of ISR of prox LAD 03/21/12 (PLAVIX NONRESPONDER). d. DES to LAD for ISR 07/2012 (pt refused consideration of CABG).  [name] A-fib     a. Noted 07/2012 - not on anticoag due for need for DAPT and age.  . Thrombocytopenia     a. Noted 07/2012.  . On home oxygen therapy     2L/min with ambulation as of 07/2012.    Past Surgical History  Procedure Laterality Date  . Cholecystectomy    . Tonsillectomy    . Hernia repair    . Cataract extraction    . Coronary angioplasty with stent placement  03/21/2012    ROS: Review of systems complete and found to be negative unless listed above  PHYSICAL EXAM BP 128/66  Pulse 72  Ht 5\' 6"  (1.676 m)  Wt 182 lb 6.4 oz (82.736 kg)  BMI 29.45 kg/m2  General: Well developed, well nourished, in no acute distress Head: Eyes PERRLA, No xanthomas.   Normal cephalic and atramatic  Lungs: Clear bilaterally to auscultation and percussion. Heart: HRIR S1 S2, distant with soft systolic murmur..  Pulses are 2+ & equal.            No carotid bruit. No JVD.   Abdomen: Bowel sounds are positive,  abdomen soft and non-tender without masses or                  Hernia's noted. Msk:  Back mild kyphosis, normal gait. Normal strength and  tone for age. Extremities: No clubbing, cyanosis , she has mild dependent edema.  DP +1 Neuro: Alert and oriented X 3. Psych:  Good affect, responds appropriately    ASSESSMENT AND PLAN

## 2012-12-07 NOTE — Assessment & Plan Note (Signed)
Continued management with statin. Follow up labs in 6 months.

## 2012-12-07 NOTE — Patient Instructions (Signed)
Your physician recommends that you schedule a follow-up appointment in: 3 MONTHS with Dr. Purvis Sheffield

## 2012-12-15 ENCOUNTER — Other Ambulatory Visit: Payer: Self-pay | Admitting: *Deleted

## 2012-12-15 MED ORDER — ROSUVASTATIN CALCIUM 10 MG PO TABS: 10.0000 mg | ORAL_TABLET | Freq: Every day | ORAL | Status: DC

## 2012-12-22 ENCOUNTER — Other Ambulatory Visit: Payer: Self-pay | Admitting: Adult Health

## 2013-01-03 ENCOUNTER — Telehealth: Payer: Self-pay | Admitting: Pulmonary Disease

## 2013-01-03 DIAGNOSIS — E039 Hypothyroidism, unspecified: Secondary | ICD-10-CM

## 2013-01-03 NOTE — Telephone Encounter (Signed)
  Notes Recorded by [name], MD on 10/22/2012 at 10:21 AM Please notify patient>  Chems look good> BS=100 on the Glim1mg -1/2 tab; Renal stable, LFTs normal... CBC is ok w/ mild anemia Hg=11.8, Iron is borderline low 7 needs to continue the Fe daily w/ vitC...  NEW- Thyroid is sluggish & needs to start SYNTHROID 66mcg/d #30 w/ rtefills...  Spoke with patient-- Patient concerned about having real bad sweats x 2 weeks  Patient states the sweats occur approx every 3-4 hrs starting form about 2pm until midnight Patient states she feels her body is burning up but feels cool-- Prior to starting synthroid, Effient was added in Mar '14 Says she had no problems w effient and now is concerned if the synthroid alone is causing sweats or in addition to effient Requesting recs from Dr. Kriste Basque-- please advise thank you!  Last OV 10/21/12 Next OV 01/23/13  Allergies  Allergen Reactions  . Codeine Other (See Comments)    Reaction: severe GI pain  . Diazepam Other (See Comments)    REACTION: causes pt unable to wake up after taking  . Lipitor (Atorvastatin Calcium) Other (See Comments)    Reaction: causes vertigo  . Penicillins Other (See Comments)    Reaction: unknown (childhood )  . Plavix (Clopidogrel Bisulfate)     Nonresponder  . Sulfonamide Derivatives Other (See Comments)    Reaction: unknown (childhood)  . Mango Flavor Rash

## 2013-01-03 NOTE — Telephone Encounter (Signed)
Pt aware and order placed. Pt states she will go to [facility] to have labs drawn. Carron Curie, CMA  Carron Curie, CMA

## 2013-01-03 NOTE — Telephone Encounter (Signed)
Per SN---   Please check TSH level.  thanks

## 2013-01-06 ENCOUNTER — Other Ambulatory Visit: Payer: Self-pay | Admitting: Pulmonary Disease

## 2013-01-07 LAB — TSH: TSH: 2.802 u[IU]/mL (ref 0.350–4.500)

## 2013-01-16 ENCOUNTER — Other Ambulatory Visit: Payer: Self-pay | Admitting: Pulmonary Disease

## 2013-01-16 ENCOUNTER — Telehealth: Payer: Self-pay | Admitting: Pulmonary Disease

## 2013-01-16 MED ORDER — ONETOUCH LANCETS MISC: Status: DC

## 2013-01-16 MED ORDER — GLUCOSE BLOOD VI STRP: ORAL_STRIP | Status: DC

## 2013-01-16 NOTE — Telephone Encounter (Signed)
Attempted to call pt ---no answer.  i have called the pharmacy and these have been called in for the pt to get test strips and lancets.  Pharmacy stated that the pt uses the one touch ultra machine.  These have been added to pts med list and these will be delivered to the pt.

## 2013-01-23 ENCOUNTER — Ambulatory Visit (INDEPENDENT_AMBULATORY_CARE_PROVIDER_SITE_OTHER): Payer: Medicare Other | Admitting: Pulmonary Disease

## 2013-01-23 ENCOUNTER — Encounter: Payer: Self-pay | Admitting: Pulmonary Disease

## 2013-01-23 VITALS — BP 138/64 | HR 100 | Temp 97.6°F | Ht 66.0 in | Wt 187.6 lb

## 2013-01-23 DIAGNOSIS — D869 Sarcoidosis, unspecified: Secondary | ICD-10-CM

## 2013-01-23 DIAGNOSIS — E559 Vitamin D deficiency, unspecified: Secondary | ICD-10-CM

## 2013-01-23 DIAGNOSIS — F419 Anxiety disorder, unspecified: Secondary | ICD-10-CM

## 2013-01-23 DIAGNOSIS — I739 Peripheral vascular disease, unspecified: Secondary | ICD-10-CM

## 2013-01-23 DIAGNOSIS — I2510 Atherosclerotic heart disease of native coronary artery without angina pectoris: Secondary | ICD-10-CM

## 2013-01-23 DIAGNOSIS — E663 Overweight: Secondary | ICD-10-CM

## 2013-01-23 DIAGNOSIS — E119 Type 2 diabetes mellitus without complications: Secondary | ICD-10-CM

## 2013-01-23 DIAGNOSIS — E785 Hyperlipidemia, unspecified: Secondary | ICD-10-CM

## 2013-01-23 DIAGNOSIS — I872 Venous insufficiency (chronic) (peripheral): Secondary | ICD-10-CM

## 2013-01-23 DIAGNOSIS — D649 Anemia, unspecified: Secondary | ICD-10-CM

## 2013-01-23 DIAGNOSIS — M1990 Unspecified osteoarthritis, unspecified site: Secondary | ICD-10-CM

## 2013-01-23 DIAGNOSIS — I10 Essential (primary) hypertension: Secondary | ICD-10-CM

## 2013-01-23 DIAGNOSIS — I4891 Unspecified atrial fibrillation: Secondary | ICD-10-CM

## 2013-01-23 NOTE — Progress Notes (Signed)
Subjective:    Patient ID: Alexandra [name], female    DOB: [birth year], 79 y.o.   MRN: [id number]  HPI 79 y/o WF here for a follow up visit... he has multiple medical problems including AB, hx inactive Sarcoid, HBP, PVD, VI, Hyperlipidemia, DM, Obesity, GERD/ Divertics/ IBS, DJD/ FM/ osteop, Anxiety, Anemia...  ~  August 25, 2011:  33mo ROV & we reviewed her interval developments & she continues to f/u w/ DrRothbart regularly...    She had nonQ-MI 10/12 & cath showed severe 3 vessel CAD w/ PTCA/ stent placed in LAD (99% occluded) + med rx w/ Plavix added; there was AK anteriorly but LVF was preserved; DrRothbart did a Myoview scan 3/13 w/ SVT developing after the infusion w/ CP & abn STsegment changes; she spont reverted to NSR w/o need for intervention; Myoview images showed scar anteroseptal area + soft tissue attenuation & minor ischemia at the apex & EF=77%;  They incr her Metoprolol to 75mg Bid...    She saw DrHarrison in [place] 1/13 for a left knee injection. LABS 3/13:  FLP- at goals on Simva40 x TG=157;  Chems- ok x BS=126 A1c=6.0 on Metform;  CBC- Hg=11.3;  TSH=4.11;  BNP=309  ~  December 07, 2011:  52mo ROV & Alexandra [name] was Adm 7/1 - 12/04/11 w/ unstable angina (pain involved her neck & throat, then across her chest to arms) and cath showed another 99% focal proxLAD stenosis within the prev stent & mod RCA & Circ disease (including a 90% mid-distal RCA lesion- felt too difficult to intervene); she had a PTCA/ cutting balloon for the LAD in stent restenosis; continue ASA/ Plavix; continue other meds including Metoprolol, Amlodipine, Avapro, Lasix, Simvastatin...  She was also noted to be ANEMIC w/ Hg in the 8-9 range, stools reported neg & she is on FeSO4;  Renal function is borderline w/ Creat in the 0.9-1.2 range & GFR 40-50 range (her Metformin was held in hosp & review of DM control has been good on MetformBid so we decided to restart at just 500mg  Qam)... Finally she notes that "my nerves are shot" and we  will rec Lorazepam 0.5mg - 1/2 to 1 tab Tid as needed...    We reviewed prob list, meds, xrays and labs> see below for updates>> CXR 7/13 showed cardiomegaly & mild vasc congestion... LABS 7/13:  Chems- BS=144 BUN=16 Creat=1.17;  CBC- Hg=8.7  ~  January 20, 2012:  6wk ROV & Alexandra [name] is here for follow up; she also brought several loose papers from the [place] for her license- I told her we needed the whole package to complete but she was quite insistent that she had to bring the separate pages to all her specialists for completion, so- whatever, we completed what she brought to [place] & returned them to her...     Known CAD w/ recent in-stent restenosis intervention> no further angina/ CP & she is stable on ASA/ Plavix- Cards also wants her to continue ProtonixBid...    We had restarted her Metformin500mg /d & repeat labs today showed BS=118...    She had mild renal insuffic w/ Creat~1.2 & GFR 40-50 range and this is stable...    She had anemia- on FeSO4 and her Hg is up 2 points from disch 8.7 ==> 10.7 w/ FE=45 (16%) today...    We gave her Ativan to try for her anxiety "my nerves are shot" & she feels this is helpful... We reviewed prob list, meds, xrays and labs> see below for updates >> LABS 8/13:  Chems- ok w/ BS=118 BUN=23 Creat=1.2 GFR=44;  CBC- improved w/ Hg=10.7. Fe=45 (sat16%)   ~  April 20, 2012:  9mo ROV & Alexandra [name] was Adm 10/20 - 03/24/12 by Cards w/ NSTEMI & cath showed in-stent restenosis LAD- s/p cutting balloon angioplasty; she also had resid dis in RCA & CIRC- continue med rx;  They also noted HBP w/ hypotension this adm, asymptomatic SVT on tele, valv heart dis w/ thickened/ calcified AoV & modMR, mod carotid stenoses, mild renal insuffic... Followed by DrRothbart & cardiology notes reviewed & Med Rec performed>>    AB, Hx Sarcoid> not currently on any meds for breathing...    HBP> on Metop50-1.5tabsBid, Amlod2.5, Avapro150, Lasix40; BP=124/56 & she is feeling ok- denies CP, palpit, ch in SOB,  edema, etc...    CAD> on ASA81, Effient5, & off Plavix; Hosp 10/13 as above, now improved; she saw Cards[facility] 11/13- stable, continue same meds...    ASPVD> on above meds; known bilat 50-69% ICA stenoses but she is asymptomatic w/o cerebral ischemic symptoms; she is wat too sedentary 7 denies claud symptoms...    VI> she knows to elevate legs, elim sodium, use support hose, etc...    Hyperlipid> on Cres10, off Simva due to Norvasc Rx added; we will need to f/u w/ FLP on the Cres10...    DM> on Glimep1mg , off Metform due to mild RI; BS~110 & A1c=6.3; she knows to avoid sweets, watch sugars...    Overweight> weight = 187# w/ BMI=30; we reviewed diet & exercise...    GI> GERD, Divertics, IBS> on Protonix40Bid; she denies abd pain, n/v, c/d, blood seen...    Renal Insuffic> Creat~ 1.3-1.4 range, they stopped her Metform in hosp & used Glimep1mg  at discharge...    DJD, FM> on Tylenol alone; she saw DrHarrison 2wks ago for bilat knee injections- improved...    Vit D defic> on VitD 1000u OTC; continue same...    Anxiety> on Elavil10, off Lorazepam;     Anemia> on Fe, VitC, VitB12; Hg=10.1, MCV=89, continue supplements... We reviewed prob list, meds, xrays and labs> see below for updates >>  CXR 10/13 showed stable heart size, diffuse mitral annular calcif, clear lungs, DJD in TSpine LABS 10/13:  Chems- ok w/ BUN=25-29 Creat=1.3-1.4 BS=113-116 A1c=6.3;  CBC- Hg=9.9-10.1    ~  July 25, 2012:  9mo ROV & Alexandra [name] quips "I'm still here" w/ continued cardiac problems, angina, & need for NTG rx... She notes her "nerves run away w/ me" & the Vistaril helps... She is not fond of her apt in [place] & thinking of [place]... We reviewed the following medical problems during today's office visit >>     AB, Hx Sarcoid> not currently on any meds for breathing; stable, inactive dis...    HBP> on Metop50-1.5tabsBid, Amlod2.5, Avapro150, Lasix40; BP=118/72 & she is feeling ok- denies current CP, palpit, ch in SOB, edema, etc...     CAD> on ASA81, Effient5, & off Plavix; Hosp 10/13 as above, now improved; she saw [facility] 11/13- stable, continue same meds...    ASPVD> on above meds; known bilat 50-69% ICA stenoses but she is asymptomatic w/o cerebral ischemic symptoms; she is wat too sedentary & denies claud symptoms...    VI> she knows to elevate legs, elim sodium, use support hose, etc...    Hyperlipid> on Cres10, off Simva due to Norvasc Rx added; we will need to f/u w/ FLP on the Cres10...    DM> on Glimep1mg , off Metform due to mild RI; BS=85 & A1c=6.1;  we decided to decr the Glimep1mg  to 1/2 tab/d...    Overweight> weight = 188# w/ BMI=30; we reviewed diet & exercise...    GI> GERD, Divertics, IBS> on Protonix40Bid; she denies abd pain, n/v, c/d, blood seen...    Renal Insuffic> Creat~ 1.3-1.4 range, they stopped her Metform in hosp & used Glimep1mg  at discharge...    DJD, FM> on Tylenol alone; she saw DrHarrison 2wks ago for bilat knee injections- improved...    Vit D defic> on VitD 1000u OTC; continue same...    Anxiety> on Elavil10, and Hydroxyzine25 prn, off Lorazepam; states the Hydroxyzine helps...    Anemia> on Fe, VitC, VitB12; Hg=11.1, Fe=79 (27%sat); continue supplements... We reviewed prob list, meds, xrays and labs> see below for updates >>  LABS 2/14:  Chems- ok w/ Cr=1.3 BS=85 A1c=6.1;  CBC- Hg=11.1 Fe=79 (27%sat)...  ~  September 09, 2012:  6wk ROV & Alexandra [name] was once again hosp in the interim> ADM 3/19 - 08/22/12 by Cards w/ CP & NSTEMI due to severe restenosis in the prox LAD stent, s/p DES to LAD, pt has refused CABG; she is a Plavix non-respnder; she also had AFib on rate control strategy (not on anticoag), meds adjusted as indicated below...  Today she is c/o lower sternal & costochondral CWP which is sore & tender=> we discussed REST, HEAT, Tramadol tid prn...    HBP> on Metop50-decr to 1AM & 1/2PM, Lasix40, & off Amlod & Avapro; BP=120/72 & she is feeling ok- denies current CP, palpit, ch in SOB, edema,  etc...    CAD> on ASA81, Effient5 & off Plavix; Hosp 10/13 & 3/14 as noted- w/ NSTEMI & PTCA/ DES to LAD- she has refused CABG; also had AFib & they decided on rate control; she had f/u OV [facility] 4/14- continue duel antiplat Rx w/ ASA & Effient, meds adjusted...     AFib> she was found to have AFib 3/14 adm 7 they decided on rate control strategy as noted...    ASPVD> on above meds; known bilat 50-69% ICA stenoses but she is asymptomatic w/o cerebral ischemic symptoms; she is wat too sedentary & denies claud symptoms...    VI> she knows to elevate legs, elim sodium, use support hose, etc...    Hyperlipid> on Cres10, off Simva due to Norvasc Rx added; we will need to f/u w/ FLP on the Cres10 (it was not checked during the recent hosp)...    DM> on Glimep1mg , off Metform due to mild RI; BS=85 & A1c=6.1; we decided to decr the Glimep1mg  to 1/2 tab/d & have made this change in Epic...    Overweight> weight = 189# w/ BMI=30; we reviewed diet & exercise...    GI> GERD, Divertics, IBS> on Protonix40Bid; she denies abd pain, n/v, c/d, blood seen...    Renal Insuffic> Creat~ 1.3-1.4 range, and stable during recent hosp...    Anxiety> on Elavil10, and Hydroxyzine25 prn, off Lorazepam; states the Hydroxyzine helps...    Anemia> on Fe, VitC, VitB12; Hg= 9-10 range, Fe=79 (27%sat); continue supplements... We reviewed prob list, meds, xrays and labs> see below for updates >>   ~  Oct 21, 2012:  6wk ROV & last visit Alexandra [name] was c/o CWP for which we rec Rest/ Heat/ Tramadol; we also decided to decr her Glimep1mg  back to 1/2 tab Qam; she tells me that stopped the Tramadol as she feels it reacted on her w/ confusion etc- she states that if she takes ASA81mg  & one Tylenol325mg  that she does just fine ("  I take this at night & sleep like a baby")... She wanted me to know that she planned her funeral last week!    She had f/u w/ Cards 4/14> HBP, CAD- NSTEMI, AFib, Carotid stenosis> on ASA81 & Effient5 w/o recurrent ischemic  symptoms; on Metoprolol & Lasix, BP= 120/64 & she notes heart "a flippin & a flappin on occas"...    Lipids controlled on Cres10 w/ last FLP 10/13 showing TChol 116, TG 97, HDL 46, LDL 51    BS has remained good per pt since we decr the Glimep1mg  to 1/2 Qam; lab today shows BS= 100; continue same...    Her weight is down 3# to 186# today...    Anemia> on Fe, VitC, Vit B12, etc; Labs shows Hg= 11.8 & Fe= 42 (13%); Rec to continue Fe/ VitC etc... We reviewed prob list, meds, xrays and labs> see below for updates >>  LABS 5/14:  Chems- ok w/ BS=100, BUN=26, Cr=1.3;  CBC- ok w/ Hg=11.8 & Fe=42 (13%);  TSH=15.48  ADDENDUM>>  TSH= 15.48 & we will start SYNTHROID79mcg/d... Follow up labs 01/06/13 showed TSH= 2.80 therefore continue Levothyroid 72mcg/d...  ~  January 23, 2013:  67mo ROV & Alexandra [name] is concerned about the $$ of her Effient & Crestor, but taking both regularly;  We reviewed the following medical problems during today's office visit >>     She had f/u w/ Cards 4/14> HBP, CAD- NSTEMI, AFib, Carotid stenosis> on ASA81 & Effient5 w/o recurrent ischemic symptoms; on Metoprolol-50AM&25PM + Lasix40, BP= 138/64 & she denies recent CP, palpit, dizzy, ch in SOB/edema; DrRothbart has retired & she will be seeing DrKoneswaran soon...    Lipids controlled on Cres10 w/ last FLP 10/13 showing TChol 116, TG 97, HDL 46, LDL 51; reminded to come FASTING for blood work!    BS has remained good per pt since we decr the Glimep1mg  to 1/2 Qam; lab 5/14 shows BS= 100; & last A1c 2/14 = 6.1    Thyroid is now replaced on synthroid50 w/ f/u TSH 8/14 = 2.80, continue same...    Her weight is up 3# to 188# today...    She still sees DrSHarrison every 3-35mo for shots in her knees & she tells me she is going to see him tomorrow...    Anemia> on Fe, VitC, Vit B12, etc; Labs 5/14 showed Hg= 11.8 & Fe= 42 (13%); Rec to continue Fe/ VitC etc... We reviewed prob list, meds, xrays and labs> see below for updates >>           Problem  List:  Hx of ASTHMATIC BRONCHITIS, ACUTE (ICD-466.0) - uses Mucinex as needed... she has chr stable DOE w/o change... she say she walks "some" but is obviously too sedentary due to her arthritis... she has PNEUMOVAX several yrs ago.  Hx of SARCOIDOSIS (ICD-135) - initially diagnosed in 1992 w/ CXR showing diffuse ILDis, +Gallium scan, ACE=61, & Bronch w/ bx showing non-caseating granulomas... treated w/ Pred & weaned off w/ no active disease since then... last CTChest 7/04 w/ ca++ Ao & coronaries, +mediastinal fat, centilobular emphysema, some scarring... stable without new symptoms of cough, sputum, CP or dyspnea... ~  CXR 1/09 showed stable chr lung changes & prom right hilum... ~  CXR 2/11 showed diffuse ILDz, no adenopathy, NAD.[name]. ~  CXR 2/12 showed borderline cardiomeg, tort Ao, sl incr markings as before, NAD.[name]. ~  CXR 10/12 in hosp showed cardiomeg, calcif in Ao, vasc congestion w/ pulm edema & sm effusions... ~  CXR 7/13 in hosp showed cardiomegaly & mild vasc congestion... ~  CXR 10/13 showed stable heart size, diffuse mitral annular calcif, clear lungs, DJD in TSpine  HYPERTENSION (ICD-401.9) - controlled on meds: METOPROLOL 50mg - 1.5 tabsBid, AMLODIPINE 2.5mg /d, AVAPRO 150mg /d, LASIX 40mg /d... ~  Myoview 12/05 was neg- no ischemia, no infarct... ~  EKG 2/11 showed NSR, rate 82/min, WNL.[name]. ~  4/12:  DrHochrein increased her Metoprolol from 25Bid to 50Bid for her BP & ectopy... ~  6/12:  BP= 136/74 & denies HA, visual changes, CP, palipit, syncope, change in dyspnea, edema, etc... ~  3/13:  BP= 124/70 & she is feeling well, denies CP/ SOB/ ch in edema/ etc... ~  7/13:  BP= 124/60 & these meds were not changed during her recent hosp for angina w/ cath & PCI for LAD in-stent restenosis... ~  8/13:  BP= 130/60 & she denies CP, palpit, ch in SOB or edema... ~  11/13:  BP= 124/56 on same regimen & she denies CP, palpit, SOB, edema... ~  2/14: on Metop50-1.5tabsBid, Amlod2.5, Avapro150,  Lasix40; BP=118/72 & she is feeling ok- denies current CP, palpit, ch in SOB, edema, etc. ~  4/14: on Metop50-decr to 1AM & 1/2PM, Lasix40, & off Amlod & Avapro; BP=120/72 & she is feeling ok- denies current CP, palpit, ch in SOB, edema, etc.  VALVULAR HEART DISEASE>  PALPITATIONS & Ventric Ectopy>  Eval & Rx by DrHochrein, his notes are reviewed... On METOPROLOL 75mg Bid & she uses HYDROXYZINE (Vistaril) 25mg  Prn which she says is helpful... ~  Event Monitor done 4/12 and no definite AFib encountered, just PVCs...  ~  EKG shows just some PACs, otherw neg... ~  2DEcho 10/13 showed mild LVH, norm LVF w/ EF=65-70%; AoV w/ mildly thickened & calcif leaflets; heavy mitral annular calcif, mod thickened leaflets, & modMR...  CORONARY ARTERY DISEASE >> on ASA 81mg /d & EFFIENT5mg  (off Plavix now); plus the meds above & followed by DrRothbart... ~  She had nonQ-MI 10/12 & cath showed severe 3 vessel CAD w/ PTCA/ stent placed in LAD (99% occluded) + med rx w/ Plavix added; there was AK anteriorly but LVF was preserved... ~  EKG 11/12 showed NSR, rate85, PACs, septal infarct, otherw neg... ~  DrRothbart did a Myoview scan 3/13 w/ SVT developing after the infusion w/ CP & abn STsegment changes; she spont reverted to NSR w/o need for intervention; Myoview images showed scar anteroseptal area + soft tissue attenuation & minor ischemia at the apex & EF=77%; They incr her Metoprolol to 75mg Bid... ~  7/13:  She was [facility] for unstable angina w/ cath showing 99% prox LAD in-stent restenosis- PCI w/ cutting ballon & improved; she had mod dis in RCA & Circ (including worsening 90% distal RCA stenosis- but intervention was r/o)... EKG showed NSR, PACs, rate66, otherw wnl, NAD... ~  10/13:  She was [facility] w/ NSTEMI & cath showed in-stent restenosis LAD- s/p cutting balloon angioplasty; she also had resid dis in RCA & CIRC- continue med rx;  They also noted HBP w/ hypotension this adm, asymptomatic SVT on tele, valv heart dis w/  thickened/ calcified AoV & modMR, mod carotid stenoses, mild renal insuffic... Followed by DrRothbart & cardiology notes reviewed & Med Rec performed... ~  2/14: on ASA81, Effient5, & off Plavix; Hosp 10/13 as above, now improved; she saw CardsPA 11/13- stable, continue same meds... ~  4/14: on ASA81, Effient5 & off Plavix; Hosp 10/13 & 3/14 w/ NSTEMI & PTCA/ DES to LAD- she  has refused CABG; also had AFib & they decided on rate control; she had f/u OV [facility] 4/14- continue duel antiplat Rx w/ ASA & Effient, meds adjusted.  PERIPHERAL VASCULAR DISEASE (ICD-443.9) - on ASA 81, EFFIENT5... doing well w/o claud symptoms or cerebral ischemic symptoms... ~  Art Dopplers of LE's 6/06 showed biphasic waveforms in ant tib & peroneal arts, normal ABI's... ~  CDopplers 5/12 showed mild heterogeneous plaque bilat w/ 40-59% bilat ICA stenoses... ~  CDopplers 4/13 showed mild to mod plaque bilat, 50-69% bilat ICA stenoses (lower end but R>L)...  VENOUS INSUFFICIENCY (ICD-459.81) - on low sodium diet and the Lasix... she knows to elevate legs, wear support hose, etc... ~  7/13:  She has persistent bilat LE edema & weight is similar 192# on LASIX 40mg /d   HYPERLIPIDEMIA (ICD-272.4) - on SIMVASTATIN 40mg /d... ~  FLP 1/08 showed TChol 121, TG 120, HDL 46, LDL 51... looks great- continue Simva40. ~  FLP 1/09 showed TChol 137, TG 165, HDL 37, LDL 67 ~  FLP 9/09 showed TChol 133, TG 141, HDL 47, LDL 57 ~  FLP 5/10 showed TChol 131, TG 190, HDL 46, LDL 47 ~  FLP 2/11 showed TChol 129, TG 129, HDL 52, LDL 51 ~  FLP 2/12 showed TChol 134, TG 159, HDL 49, LDL 53 ~  FLP 3/13 on Simva40 showed TChol 112, TG 157, HDL 46, LDL 35... Continue same + better low fat diet. ~  FLP 10/13 on Cres10 showed TChol 116, TG 97, HDL 46, LDL 51... Changed to Cres10 during 10/13 adm...  DM (ICD-250.00) - on diet + GLIMEPIRIDE => see below... ~  labs 1/08 showed BS=130 and HgA1c=6.1 ~  labs 1/09 showed BS= 137, HgA1c= 6.2... diet and  exercise are the keys!!! ~  labs 9/09 showed BS= 119, HgA1c= 6.3 ~  labs 5/10 showed BS= 134, A1c= 6.3 ~  labs 2/11 showed BS= 122, A1c= 6.3 ~  labs 6/11 showed BS= 106 ~  Labs 2/12 showed BS= 138, A1c= 6.3 ~  Labs 3/13 on Metform500Bid showed BS= 126, A1c= 6.0.Marland KitchenMarland Kitchen Continue same, get wt down! ~  7/13:  Metformin was held 7/13 Hosp w/ Creat= 0.9-1.2 & GFR= 40-50 range; we decided to restart METFORMIN 500mg  Qam only... ~  Labs 8/13 on Metform500 showed BS= 118 ~  Labs 10/13 in hosp showed BS=113, A1c=6.3 ~  2/14: on Glimep1mg , off Metform due to mild RI; BS=85 & A1c=6.1; we decided to decr the Glimep1mg  to 1/2 tab/d. ~  4/14: she didn't decr the Glimep as prev discussed> asked to decr the Glimep1mg  to 1/2 tab Qam... ~  Labs 5/14 on Glim1mg -1/2 daily and BS=100  HYPOTHYROID >>  ~  Routine labs 5/14 showed TSH= 15.48 & we started SYNTHROID 78mcg/d... ~  Follow up Labds 8/14 on Synthroid50 showed TSH= 2.80, continue same...  MORBID OBESITY (ICD-278.01) - prev wt 227#  & 5\' 2"  tall for a BMI=41-42...  ~  weight 1/10 = 217# ~  weight 5/10 = 215# ~  weight 9/10 = 210# ~  weight 2/11 = 210# ~  weight 6/11 = 203# ~  weight 10/11 = 203# ~  Weight 6/12 = 195# ~  Weight 3/13 = 192# ~  Weight 7/13 = 192# ~  Weight 8/13 = 188# ~  Weight 11/13 = 187# ~  Weight 2/14 = 188# ~  Weight 5/14 = 186# ~  Weight 8/14 = 188#  GERD (ICD-530.81) - pt was placed on PROTONIX 40mg Bid 7/13 due  to reflux symptoms and Anemia... ~  She is encouraged to f/u w/ GI & she indicates that she will call DrRehman when she is ready...  DIVERTICULOSIS, COLON (ICD-562.10) - had colonoscopy by DrDBrodie 1997 that was neg x divertics... now followed in [place]...  IRRITABLE BOWEL SYNDROME (ICD-564.1)  RENAL INSUFFICIENCY >> Creat ~1.2 and GFR ~ 40-50 range...  DEGENERATIVE JOINT DISEASE, ADVANCED (ICD-715.90) - she has mod severe bilat knee pain and sees DrHarrison, Ortho for shots "every three months"... she is  opposed to surgery (see his EMR notes- reviewed)...  VITAMIN D DEFICIENCY (ICD-268.9) ~  labs 5/10 showed Vit D level =13... therefore rec Vit D 50000 u weekly. ~  labs 2/11 showed Vit D level = 32... OK to change to VitD 1000 u daily.. ~  BMD done 6/11 showed TScores -1.3 in Spine, and -2.5 in left FemNeck... rec> start Alendronate 70mg /wk but she refuses Bisphos Rx "I'll take my chances"...  FIBROMYALGIA (ICD-729.1)  ANXIETY (ICD-300.00) - we prev perscribed HYDROXYZINE PAMOATE 25mg  Prn for "throat closing" symptoms... ~  7/13:  She admits that "my nerves are shot" after recent [facility] & Rx LORAZEPAM 0.5mg - 1/2 to 1 tab Tid as needed... ~  2/14:  She stopped the Ativan on her own & now using just the ELAVIL 10mg  & HYDROXYZINE 25mg  prn...  ANEMIA (ICD-285.9) - hx mild anemia w/ Hg = 10.8 Jan09 & started Fe daily at that time...  ~  labs 5/09 showed Hg= 11.7, Fe= 64 ~  labs 9/09 showed Hg= 11.4, Fe= 43... rec- take FeSO4 w/ Vit C 500mg /d. ~  labs 5/10 showed Hg= 11.3,  Fe= 50 ~  labs 2/11 showed Hg= 10.7, Fe= 36 (12%)... rec> incr Fe Vit C to Bid. ~  labs 6/11 showed Hg= 10.9, Fe= 58 (19%sat)... continue Bid Fe ~  Labs 2/12 showed Hg= 11.6, Fe= 51 (16%sat)... Continue same. ~  Labs 3/13 showed Hg= 11.3.Marland KitchenMarland Kitchen Continue FeSO4 Bid... ~  Labs during 7/13 Hosp showed Hg= 8-9 range... rec to continue FeSO4 Bid... ~  Labs 8/13 showed Hg= 10.7 and Fe= 45 (16%)... Ok to decr Fe to one daily... ~  Labs 10/13 in hosp showed Hg= 10.1 ~  2/14: on Fe, VitC, VitB12; Hg=11.1, Fe=79 (27%sat); continue supplements. ~  4/14: on Fe, VitC, VitB12; Hg= 9-10 range; continue supplements... ~  5/14: on Fe, VitC, VitB12, etc; Labs shows Hg= 11.8 & Fe= 42 (13%); Rec to continue Fe/ VitC etc  INTERMITTENT VERTIGO (ICD-780.4) - prev eval DrWeymann w/ Meclizine Prn...   Past Surgical History  Procedure Laterality Date  . Cholecystectomy    . Tonsillectomy    . Hernia repair    . Cataract extraction    . Coronary  angioplasty with stent placement  03/21/2012    Outpatient Encounter Prescriptions as of 01/23/2013  Medication Sig Dispense Refill  . acetaminophen (TYLENOL) 500 MG tablet Take 1 tablet (500 mg total) by mouth every 6 (six) hours as needed for pain.      [name] amitriptyline (ELAVIL) 10 MG tablet TAKE (1) TABLET BY MOUTH ONCE DAILY.  30 tablet  11  . ascorbic acid (VITAMIN C) 500 MG tablet Take 500 mg by mouth 2 (two) times daily. Twice daily with iron tablet      . aspirin EC 81 MG tablet Take 81 mg by mouth every morning.       . cholecalciferol (VITAMIN D) 1000 UNITS tablet Take 1,000 Units by mouth daily.      [name]  econazole nitrate 1 % cream Apply 1 application topically daily as needed. FOR FEET IRRITATION      . EFFIENT 5 MG TABS TAKE (1) TABLET BY MOUTH ONCE DAILY.  30 tablet  3  . ferrous sulfate 324 (65 FE) MG TBEC Take 1 tablet by mouth 2 (two) times daily.       . furosemide (LASIX) 40 MG tablet Take 40 mg by mouth daily.       [name] glimepiride (AMARYL) 1 MG tablet Take 1/2 tablet by mouth daily  15 tablet  6  . glucose blood (ONE TOUCH TEST STRIPS) test strip Test once daily. DX;  250.00  50 each  1  . hydrOXYzine (ATARAX/VISTARIL) 25 MG tablet Take 1 tablet (25 mg total) by mouth every 6 (six) hours as needed. For itching or anxiety  30 tablet  5  . levothyroxine (SYNTHROID, LEVOTHROID) 50 MCG tablet Take 1 tablet (50 mcg total) by mouth daily before breakfast.  30 tablet  11  . meclizine (ANTIVERT) 25 MG tablet Take 1 tablet (25 mg total) by mouth 3 (three) times daily as needed for dizziness.  50 tablet  6  . metoprolol (LOPRESSOR) 50 MG tablet Take 50 mg in the AM and 25 mg (1/2) tablet in the PM  45 tablet  6  . nitroGLYCERIN (NITROSTAT) 0.4 MG SL tablet Place 1 tablet (0.4 mg total) under the tongue every 5 (five) minutes as needed for chest pain (up to 3 doses).  25 tablet  4  . ONE TOUCH LANCETS MISC Test once daily. Dx:  250.00  200 each  1  . pantoprazole (PROTONIX) 40 MG tablet  Take 1 tablet (40 mg total) by mouth 2 (two) times daily.  60 tablet  6  . rosuvastatin (CRESTOR) 10 MG tablet Take 1 tablet (10 mg total) by mouth daily.  30 tablet  6  . vitamin B-12 (CYANOCOBALAMIN) 1000 MCG tablet Take 1,000 mcg by mouth every morning.       No facility-administered encounter medications on file as of 01/23/2013.    Allergies  Allergen Reactions  . Codeine Other (See Comments)    Reaction: severe GI pain  . Diazepam Other (See Comments)    REACTION: causes pt unable to wake up after taking  . Lipitor [Atorvastatin Calcium] Other (See Comments)    Reaction: causes vertigo  . Penicillins Other (See Comments)    Reaction: unknown (childhood )  . Plavix [Clopidogrel Bisulfate]     Nonresponder  . Sulfonamide Derivatives Other (See Comments)    Reaction: unknown (childhood)  . Mango Flavor Rash    Current Medications, Allergies, Past Medical History, Past Surgical History, Family History, and Social History were reviewed in [employer] record.   Review of Systems        See HPI - all other systems neg except as noted... The patient complains of decreased hearing, dyspnea on exertion, peripheral edema, muscle weakness, and difficulty walking.  The patient denies anorexia, fever, weight loss, weight gain, vision loss, hoarseness, chest pain, syncope, prolonged cough, headaches, hemoptysis, abdominal pain, melena, hematochezia, severe indigestion/heartburn, hematuria, incontinence, suspicious skin lesions, transient blindness, depression, unusual weight change, abnormal bleeding, enlarged lymph nodes, and angioedema.   Objective:   Physical Exam      WD, Obese, 79 y/o WF in NAD... GENERAL:  Alert & oriented; pleasant & cooperative... she is very talkative... HEENT:  [place]/AT, Glasses, EOM-full, PERRLA, TMs-wax, NOSE-clear, THROAT-clear & wnl. NECK:  Supple w/ fairROM;  no JVD; no carotid bruits; no thyromegaly or nodules palpated; no  lymphadenopathy. CHEST:  decr BS bilat but clear w/o wheezing, rales or signs of consolidation... HEART:   sl irregular rhythm; without murmurs/ rubs/ or gallops detected... ABDOMEN:  Obese w/ panniculus, soft & nontender; normal bowel sounds; no organomegaly or masses palpated. EXT:  mod arthritic changes; no varicose veins/ +venous insuffic/ tr+edema bilat NEURO:  CN's intact, no focal neuro deficits... DERM:  few ecchymoses, no rash etc...  RADIOLOGY DATA:  Reviewed in the EPIC EMR & discussed w/ the patient...  LABORATORY DATA:  Reviewed in the EPIC EMR & discussed w/ the patient...   Assessment & Plan:    AB>  Breathing is stable w/o exac...  Hx Sarcoid>  CXR w/ chr changes, NAD...  CAD>  on ASA81, Effient5 & off Plavix; Hosp 10/13 & 3/14 as noted- w/ NSTEMI & PTCA/ DES to LAD- she has refused CABG; also had AFib & they decided on rate control; she had f/u [facility] Cards 4/14- continue duel antiplat Rx w/ ASA & Effient, meds adjusted. CWP>  We discussed rest, heat, Tramadol prn...  HBP>  Controlled on meds, continue same, take regularly...  Palpit, AFib>  On going eval & med adjustments from DrHochrein; better on the incr Metoprolol but notes that the Vistaril really helps too...  PVD>  On ASA & Effient now w/o cerebral ischemic symptoms; CDoppler 4/13 w/ sl progression- continue meds...  Ven Insuffic/ Edema>  We reviewed the nec sodium restriction and continue the Lasix40...  HYPERLIPID>  On Cres10 & due for f/u FLP on ret...  DM>  On Glimep1mg  & we will decr to 1/2 tab daily...  Hypothyroid>  TSH noted ~15 on 5/14 & Synthroid 50 started=> f/u TSH 8/14 is back to norm...  GI>  GERD, Divertics, IBS>  Stable, continue Protonix40Bid...  DJD>  Followed by DrHarrison in [place]...  Anxiety>  Off Lorazepam...   Patient's Medications  New Prescriptions   No medications on file  Previous Medications   ACETAMINOPHEN (TYLENOL) 500 MG TABLET    Take 1 tablet (500 mg total) by mouth  every 6 (six) hours as needed for pain.   AMITRIPTYLINE (ELAVIL) 10 MG TABLET    TAKE (1) TABLET BY MOUTH ONCE DAILY.   ASCORBIC ACID (VITAMIN C) 500 MG TABLET    Take 500 mg by mouth 2 (two) times daily. Twice daily with iron tablet   ASPIRIN EC 81 MG TABLET    Take 81 mg by mouth every morning.    CHOLECALCIFEROL (VITAMIN D) 1000 UNITS TABLET    Take 1,000 Units by mouth daily.   ECONAZOLE NITRATE 1 % CREAM    Apply 1 application topically daily as needed. FOR FEET IRRITATION   EFFIENT 5 MG TABS    TAKE (1) TABLET BY MOUTH ONCE DAILY.   FERROUS SULFATE 324 (65 FE) MG TBEC    Take 1 tablet by mouth 2 (two) times daily.    FUROSEMIDE (LASIX) 40 MG TABLET    Take 40 mg by mouth daily.    GLIMEPIRIDE (AMARYL) 1 MG TABLET    Take 1/2 tablet by mouth daily   GLUCOSE BLOOD (ONE TOUCH TEST STRIPS) TEST STRIP    Test once daily. DX;  250.00   HYDROXYZINE (ATARAX/VISTARIL) 25 MG TABLET    Take 1 tablet (25 mg total) by mouth every 6 (six) hours as needed. For itching or anxiety   LEVOTHYROXINE (SYNTHROID, LEVOTHROID) 50 MCG TABLET    Take  1 tablet (50 mcg total) by mouth daily before breakfast.   MECLIZINE (ANTIVERT) 25 MG TABLET    Take 1 tablet (25 mg total) by mouth 3 (three) times daily as needed for dizziness.   METOPROLOL (LOPRESSOR) 50 MG TABLET    Take 50 mg in the AM and 25 mg (1/2) tablet in the PM   NITROGLYCERIN (NITROSTAT) 0.4 MG SL TABLET    Place 1 tablet (0.4 mg total) under the tongue every 5 (five) minutes as needed for chest pain (up to 3 doses).   ONE TOUCH LANCETS MISC    Test once daily. Dx:  250.00   PANTOPRAZOLE (PROTONIX) 40 MG TABLET    Take 1 tablet (40 mg total) by mouth 2 (two) times daily.   ROSUVASTATIN (CRESTOR) 10 MG TABLET    Take 1 tablet (10 mg total) by mouth daily.   VITAMIN B-12 (CYANOCOBALAMIN) 1000 MCG TABLET    Take 1,000 mcg by mouth every morning.  Modified Medications   No medications on file  Discontinued Medications   No medications on file

## 2013-01-23 NOTE — Patient Instructions (Addendum)
Today we updated your med list in our EPIC system...    Continue your current medications the same...  Call for any questions...  Let's plan a follow up visit in 52mo (w fasting blood work), sooner if needed for problems.Marland KitchenMarland Kitchen

## 2013-01-24 ENCOUNTER — Encounter: Payer: Self-pay | Admitting: Orthopedic Surgery

## 2013-01-24 ENCOUNTER — Ambulatory Visit (INDEPENDENT_AMBULATORY_CARE_PROVIDER_SITE_OTHER): Payer: Medicare Other | Admitting: Orthopedic Surgery

## 2013-01-24 VITALS — BP 122/68 | Ht 66.0 in | Wt 184.0 lb

## 2013-01-24 DIAGNOSIS — M1990 Unspecified osteoarthritis, unspecified site: Secondary | ICD-10-CM

## 2013-01-24 NOTE — Progress Notes (Signed)
Patient ID: Alexandra [name], female   DOB: 1923/11/25, 79 y.o.   MRN: [id number]   Chief Complaint  Patient presents with  . Follow-up    Recheck bilateral knees and request Bilateral Knee injections   Knee  Injection Procedure Note  Pre-operative Diagnosis: left knee oa  Post-operative Diagnosis: same  Indications: pain  Anesthesia: ethyl chloride   Procedure Details   Verbal consent was obtained for the procedure. Time out was completed.The joint was prepped with alcohol, followed by  Ethyl chloride spray and A 20 gauge needle was inserted into the knee via lateral approach; 4ml 1% lidocaine and 1 ml of depomedrol  was then injected into the joint . The needle was removed and the area cleansed and dressed.  Complications:  None; patient tolerated the procedure well. Knee  Injection Procedure Note  Pre-operative Diagnosis: right knee oa  Post-operative Diagnosis: same  Indications: pain  Anesthesia: ethyl chloride   Procedure Details   Verbal consent was obtained for the procedure. Time out was completed.The joint was prepped with alcohol, followed by  Ethyl chloride spray and A 20 gauge needle was inserted into the knee via lateral approach; 4ml 1% lidocaine and 1 ml of depomedrol  was then injected into the joint . The needle was removed and the area cleansed and dressed.  Complications:  None; patient tolerated the procedure well.

## 2013-01-24 NOTE — Patient Instructions (Addendum)
You have received a steroid shot. 15% of patients experience increased pain at the injection site with in the next 24 hours. This is best treated with ice and tylenol extra strength 2 tabs every 8 hours. If you are still having pain please call the office.    

## 2013-01-24 NOTE — Progress Notes (Signed)
Patient ID: Alexandra [name], female   DOB: [birth year], 79 y.o.   MRN: [id number]  Knee  Injection Procedure Note  Pre-operative Diagnosis: left knee oa  Post-operative Diagnosis: same  Indications: pain  Anesthesia: ethyl chloride   Procedure Details   Verbal consent was obtained for the procedure. Time out was completed.The joint was prepped with alcohol, followed by  Ethyl chloride spray and A 20 gauge needle was inserted into the knee via lateral approach; 4ml 1% lidocaine and 1 ml of depomedrol  was then injected into the joint . The needle was removed and the area cleansed and dressed.  Complications:  None; patient tolerated the procedure well. Knee  Injection Procedure Note  Pre-operative Diagnosis: right knee oa  Post-operative Diagnosis: same  Indications: pain  Anesthesia: ethyl chloride   Procedure Details   Verbal consent was obtained for the procedure. Time out was completed.The joint was prepped with alcohol, followed by  Ethyl chloride spray and A 20 gauge needle was inserted into the knee via lateral approach; 4ml 1% lidocaine and 1 ml of depomedrol  was then injected into the joint . The needle was removed and the area cleansed and dressed.  Complications:  None; patient tolerated the procedure well.

## 2013-01-25 ENCOUNTER — Other Ambulatory Visit: Payer: Self-pay | Admitting: Pulmonary Disease

## 2013-02-01 ENCOUNTER — Other Ambulatory Visit: Payer: Self-pay | Admitting: Pulmonary Disease

## 2013-02-01 MED ORDER — GLUCOSE BLOOD VI STRP: ORAL_STRIP | Status: DC

## 2013-02-01 MED ORDER — ONETOUCH LANCETS MISC: Status: DC

## 2013-02-23 ENCOUNTER — Other Ambulatory Visit: Payer: Self-pay | Admitting: Adult Health

## 2013-03-20 ENCOUNTER — Ambulatory Visit (INDEPENDENT_AMBULATORY_CARE_PROVIDER_SITE_OTHER): Payer: Medicare Other | Admitting: Cardiovascular Disease

## 2013-03-20 VITALS — BP 137/75 | HR 75 | Ht 66.0 in | Wt 184.2 lb

## 2013-03-20 DIAGNOSIS — I7090 Unspecified atherosclerosis: Secondary | ICD-10-CM

## 2013-03-20 DIAGNOSIS — I4891 Unspecified atrial fibrillation: Secondary | ICD-10-CM

## 2013-03-20 DIAGNOSIS — I10 Essential (primary) hypertension: Secondary | ICD-10-CM

## 2013-03-20 DIAGNOSIS — I2510 Atherosclerotic heart disease of native coronary artery without angina pectoris: Secondary | ICD-10-CM

## 2013-03-20 DIAGNOSIS — E785 Hyperlipidemia, unspecified: Secondary | ICD-10-CM

## 2013-03-20 MED ORDER — PANTOPRAZOLE SODIUM 40 MG PO TBEC: 40.0000 mg | DELAYED_RELEASE_TABLET | Freq: Two times a day (BID) | ORAL | Status: DC

## 2013-03-20 NOTE — Progress Notes (Signed)
Patient ID: Alexandra [name], female   DOB: [birth year], 79 y.o.   MRN: [id number]      SUBJECTIVE: Alexandra [name] is an 79 year old woman with CAD, status post hospitalization in March of 2014 with a non-ST elevation MI. She was found to have severe stenosis in the proximal LAD stent and complex CAD with moderately severe disease of the RCA and circumflex. She was offered CABG but refused. Per Dr. Clifton James, attempted PCI of the RCA or circumflex should only be done if she had recurrent symptoms. He felt that PCI would be very difficult given the degree of calcium and tortuosity. She was continued on DAPT (Plavix nonresponder, on Effient). She was not placed on Coumadin or Xarelto in the setting of dual antiplatelet therapy for atrial fibrillation.   Echocardiography in 03/2012 revealed normal LV systolic function, EF 65-70%, with moderate mitral regurgitation.  She denies chest pain, but tells me of arthritis of the sternum. She has left knee arthritis and swelling for which she receives injections every few months. She uses a walker. She no longer drives but did renew her license in August.  She tells me in 79 yrs of driving, she never got a ticket or had an accident.  Allergies  Allergen Reactions  . Codeine Other (See Comments)    Reaction: severe GI pain  . Diazepam Other (See Comments)    REACTION: causes pt unable to wake up after taking  . Lipitor [Atorvastatin Calcium] Other (See Comments)    Reaction: causes vertigo  . Penicillins Other (See Comments)    Reaction: unknown (childhood )  . Plavix [Clopidogrel Bisulfate]     Nonresponder  . Sulfonamide Derivatives Other (See Comments)    Reaction: unknown (childhood)  . Mango Flavor Rash    Current Outpatient Prescriptions  Medication Sig Dispense Refill  . acetaminophen (TYLENOL) 500 MG tablet Take 1 tablet (500 mg total) by mouth every 6 (six) hours as needed for pain.      [name] amitriptyline (ELAVIL) 10 MG tablet TAKE (1) TABLET BY  MOUTH ONCE DAILY.  30 tablet  11  . ascorbic acid (VITAMIN C) 500 MG tablet Take 500 mg by mouth 2 (two) times daily. Twice daily with iron tablet      . aspirin EC 81 MG tablet Take 81 mg by mouth every morning.       . cholecalciferol (VITAMIN D) 1000 UNITS tablet Take 1,000 Units by mouth daily.      [name] econazole nitrate 1 % cream Apply 1 application topically daily as needed. FOR FEET IRRITATION      . EFFIENT 5 MG TABS TAKE (1) TABLET BY MOUTH ONCE DAILY.  30 tablet  3  . ferrous sulfate 324 (65 FE) MG TBEC Take 1 tablet by mouth 2 (two) times daily.       . furosemide (LASIX) 40 MG tablet Take 40 mg by mouth daily.       [name] glimepiride (AMARYL) 1 MG tablet Take 1/2 tablet by mouth daily  15 tablet  6  . glucose blood (ONE TOUCH TEST STRIPS) test strip Test once daily. DX;  250.00  50 each  6  . hydrOXYzine (ATARAX/VISTARIL) 25 MG tablet TAKE 1 TABLET EVERY 6 HOURS AS NEEDED FOR ITCHING OR ANXIETY.  30 tablet  5  . levothyroxine (SYNTHROID, LEVOTHROID) 50 MCG tablet Take 1 tablet (50 mcg total) by mouth daily before breakfast.  30 tablet  11  . meclizine (ANTIVERT) 25 MG tablet Take 1 tablet (  25 mg total) by mouth 3 (three) times daily as needed for dizziness.  50 tablet  6  . metoprolol (LOPRESSOR) 50 MG tablet Take 50 mg in the AM and 25 mg (1/2) tablet in the PM  45 tablet  6  . nitroGLYCERIN (NITROSTAT) 0.4 MG SL tablet Place 1 tablet (0.4 mg total) under the tongue every 5 (five) minutes as needed for chest pain (up to 3 doses).  25 tablet  4  . ONE TOUCH LANCETS MISC Test once daily. Dx:  250.00  200 each  6  . pantoprazole (PROTONIX) 40 MG tablet Take 1 tablet (40 mg total) by mouth 2 (two) times daily.  60 tablet  6  . pantoprazole (PROTONIX) 40 MG tablet TAKE (1) TABLET BY MOUTH ONCE DAILY.  30 tablet  6  . rosuvastatin (CRESTOR) 10 MG tablet Take 1 tablet (10 mg total) by mouth daily.  30 tablet  6  . vitamin B-12 (CYANOCOBALAMIN) 1000 MCG tablet Take 1,000 mcg by mouth every morning.        No current facility-administered medications for this visit.    Past Medical History  Diagnosis Date  . Asthmatic bronchitis   . Sarcoidosis     a. 1990s of lung tx with prednisone temporarily.  [name] HTN (hypertension)   . PVD (peripheral vascular disease)     a. 06/2012 Carotid U/S: < 50% bilat ICA stenosis.  . Hyperlipemia   . DM (diabetes mellitus)   . Obesity   . GERD (gastroesophageal reflux disease)   . Diverticulosis of colon   . IBS (irritable bowel syndrome)   . DJD (degenerative joint disease)     Hip and knee pain; hip bursitis; patellar subluxation  . Vitamin D deficiency disease   . Fibromyalgia   . Anxiety     a. Guaiac neg 03/2012.  . Intermittent vertigo   . Anemia   . Venous insufficiency   . PSVT (paroxysmal supraventricular tachycardia)     a. Prior hx of palpitations - developed SVT during stress Myoview 09/2011. b. Noted on tele 03/2012.  . Valvular heart disease     a. moderately thickened/mildly calcified aortic valve, mod MR 03/20/12  . CKD (chronic kidney disease), stage II   . PAC (premature atrial contraction)   . CAD (coronary artery disease) 03/2011    a. NSTEMI 03/2011 complicated by pulm edema s/p DES to subtotal LAD. b. [place] s/p PTCA/cutting balloon to prox LAD for ISR 12/01/11 (med rx for RCA/Cx dz).  c. NSTEMI 03/2012 s/p cutting angioplasty of ISR of prox LAD 03/21/12 (PLAVIX NONRESPONDER). d. DES to LAD for ISR 07/2012 (pt refused consideration of CABG).  [name] A-fib     a. Noted 07/2012 - not on anticoag due for need for DAPT and age.  . Thrombocytopenia     a. Noted 07/2012.  . On home oxygen therapy     2L/min with ambulation as of 07/2012.    Past Surgical History  Procedure Laterality Date  . Cholecystectomy    . Tonsillectomy    . Hernia repair    . Cataract extraction    . Coronary angioplasty with stent placement  03/21/2012    History   Social History  . Marital Status: Widowed    Spouse Name: N/A    Number of Children: 1    . Years of Education: N/A   Occupational History  . Not on file.   Social History Main Topics  . Smoking status: Never Smoker   .  Smokeless tobacco: Never Used  . Alcohol Use: No     Comment: social  . Drug Use: No  . Sexual Activity: No   Other Topics Concern  . Not on file   Social History Narrative   Pt is only child. Husband Duanne Guess passed away in 11/01/2002   Pt has 1 son in [place](his wife is MD doing [employer] Research )      Filed Vitals:   03/20/13 1357  Height: 5\' 6"  (1.676 m)  Weight: 184 lb 4 oz (83.575 kg)   BP 137/75 Pulse 75   PHYSICAL EXAM General: NAD Neck: No JVD, no thyromegaly or thyroid nodule.  Lungs: Clear to auscultation bilaterally with normal respiratory effort. CV: Nondisplaced PMI.  Heart regular S1/S2, no S3/S4, II/VI holosystolic murmur.  1+ left leg pretibial edema.  No carotid bruit.  Normal pedal pulses.  Abdomen: Soft, nontender, no hepatosplenomegaly, no distention.  Neurologic: Alert and oriented x 3.  Psych: Normal affect. Extremities: No clubbing or cyanosis.   ECG: reviewed and available in electronic records.   07/2012: CAD With NSTEMI: - s/p DES to LAD 08/19/12 (severe restenosis in the proximal LAD stent), complex CAD with moderately severe disease in RCA and Cx, pt refused consideration for CABG  - history: NSTEMI 03/2011 complicated by pulm edema s/p DES to subtotal LAD, [place] s/p PTCA/cutting balloon to prox LAD for ISR 12/01/11 (med rx for RCA/Cx dz), NSTEMI 03/2012 s/p cutting angioplasty of ISR of prox LAD 03/21/12 (PLAVIX NONRESPONDER)    ASSESSMENT AND PLAN: 1. CAD: remains on ASA, Crestor, Effient, and metoprolol. Symptomatically stable. 2. Atrial fibrillation: rate is controlled. No anticoagulation due to being on dual antiplatelet therapy. 3. Hyperlipidemia: on Crestor. 4. HTN: controlled on current therapy.   Prentice Docker, M.D., F.A.C.C.

## 2013-03-20 NOTE — Patient Instructions (Addendum)
Your physician recommends that you schedule a follow-up appointment in 3 MONTHS. 

## 2013-04-18 ENCOUNTER — Ambulatory Visit: Payer: Medicare Other | Admitting: Orthopedic Surgery

## 2013-04-22 ENCOUNTER — Other Ambulatory Visit: Payer: Self-pay | Admitting: Adult Health

## 2013-04-24 ENCOUNTER — Other Ambulatory Visit: Payer: Self-pay | Admitting: Adult Health

## 2013-04-29 ENCOUNTER — Encounter (HOSPITAL_COMMUNITY): Payer: Self-pay | Admitting: Emergency Medicine

## 2013-04-29 ENCOUNTER — Emergency Department (HOSPITAL_COMMUNITY): Payer: Medicare Other

## 2013-04-29 ENCOUNTER — Inpatient Hospital Stay (HOSPITAL_COMMUNITY)
Admission: EM | Admit: 2013-04-29 | Discharge: 2013-05-04 | DRG: 291 | Disposition: A | Discharge status: Home Health | Payer: Medicare Other | Attending: Internal Medicine | Admitting: Internal Medicine

## 2013-04-29 DIAGNOSIS — I5031 Acute diastolic (congestive) heart failure: Principal | ICD-10-CM | POA: Diagnosis present

## 2013-04-29 DIAGNOSIS — F411 Generalized anxiety disorder: Secondary | ICD-10-CM | POA: Diagnosis present

## 2013-04-29 DIAGNOSIS — I129 Hypertensive chronic kidney disease with stage 1 through stage 4 chronic kidney disease, or unspecified chronic kidney disease: Secondary | ICD-10-CM | POA: Diagnosis present

## 2013-04-29 DIAGNOSIS — Z9861 Coronary angioplasty status: Secondary | ICD-10-CM

## 2013-04-29 DIAGNOSIS — I4891 Unspecified atrial fibrillation: Secondary | ICD-10-CM | POA: Diagnosis present

## 2013-04-29 DIAGNOSIS — Z7982 Long term (current) use of aspirin: Secondary | ICD-10-CM

## 2013-04-29 DIAGNOSIS — I739 Peripheral vascular disease, unspecified: Secondary | ICD-10-CM | POA: Diagnosis present

## 2013-04-29 DIAGNOSIS — I2510 Atherosclerotic heart disease of native coronary artery without angina pectoris: Secondary | ICD-10-CM | POA: Diagnosis present

## 2013-04-29 DIAGNOSIS — I214 Non-ST elevation (NSTEMI) myocardial infarction: Secondary | ICD-10-CM | POA: Diagnosis present

## 2013-04-29 DIAGNOSIS — I509 Heart failure, unspecified: Secondary | ICD-10-CM | POA: Diagnosis present

## 2013-04-29 DIAGNOSIS — R0600 Dyspnea, unspecified: Secondary | ICD-10-CM

## 2013-04-29 DIAGNOSIS — M1990 Unspecified osteoarthritis, unspecified site: Secondary | ICD-10-CM | POA: Diagnosis present

## 2013-04-29 DIAGNOSIS — I252 Old myocardial infarction: Secondary | ICD-10-CM

## 2013-04-29 DIAGNOSIS — Z66 Do not resuscitate: Secondary | ICD-10-CM | POA: Diagnosis present

## 2013-04-29 DIAGNOSIS — E119 Type 2 diabetes mellitus without complications: Secondary | ICD-10-CM | POA: Diagnosis present

## 2013-04-29 DIAGNOSIS — E559 Vitamin D deficiency, unspecified: Secondary | ICD-10-CM | POA: Diagnosis present

## 2013-04-29 DIAGNOSIS — I872 Venous insufficiency (chronic) (peripheral): Secondary | ICD-10-CM | POA: Diagnosis present

## 2013-04-29 DIAGNOSIS — D649 Anemia, unspecified: Secondary | ICD-10-CM

## 2013-04-29 DIAGNOSIS — E785 Hyperlipidemia, unspecified: Secondary | ICD-10-CM | POA: Diagnosis present

## 2013-04-29 DIAGNOSIS — I10 Essential (primary) hypertension: Secondary | ICD-10-CM | POA: Diagnosis present

## 2013-04-29 DIAGNOSIS — J45909 Unspecified asthma, uncomplicated: Secondary | ICD-10-CM | POA: Diagnosis present

## 2013-04-29 DIAGNOSIS — K589 Irritable bowel syndrome without diarrhea: Secondary | ICD-10-CM | POA: Diagnosis present

## 2013-04-29 DIAGNOSIS — Z9981 Dependence on supplemental oxygen: Secondary | ICD-10-CM

## 2013-04-29 DIAGNOSIS — IMO0001 Reserved for inherently not codable concepts without codable children: Secondary | ICD-10-CM | POA: Diagnosis present

## 2013-04-29 DIAGNOSIS — Z79899 Other long term (current) drug therapy: Secondary | ICD-10-CM

## 2013-04-29 DIAGNOSIS — R0609 Other forms of dyspnea: Secondary | ICD-10-CM

## 2013-04-29 DIAGNOSIS — D869 Sarcoidosis, unspecified: Secondary | ICD-10-CM | POA: Diagnosis present

## 2013-04-29 DIAGNOSIS — K219 Gastro-esophageal reflux disease without esophagitis: Secondary | ICD-10-CM | POA: Diagnosis present

## 2013-04-29 DIAGNOSIS — N182 Chronic kidney disease, stage 2 (mild): Secondary | ICD-10-CM | POA: Diagnosis present

## 2013-04-29 DIAGNOSIS — Z8249 Family history of ischemic heart disease and other diseases of the circulatory system: Secondary | ICD-10-CM

## 2013-04-29 DIAGNOSIS — J9600 Acute respiratory failure, unspecified whether with hypoxia or hypercapnia: Secondary | ICD-10-CM | POA: Diagnosis present

## 2013-04-29 DIAGNOSIS — Z806 Family history of leukemia: Secondary | ICD-10-CM

## 2013-04-29 DIAGNOSIS — J9601 Acute respiratory failure with hypoxia: Secondary | ICD-10-CM

## 2013-04-29 LAB — CBC WITH DIFFERENTIAL/PLATELET
Basophils Absolute: 0 10*3/uL (ref 0.0–0.1)
Eosinophils Relative: 5 % (ref 0–5)
HCT: 32 % — ABNORMAL LOW (ref 36.0–46.0)
Lymphocytes Relative: 15 % (ref 12–46)
MCV: 88.2 fL (ref 78.0–100.0)
Monocytes Absolute: 0.4 10*3/uL (ref 0.1–1.0)
Neutro Abs: 4.9 10*3/uL (ref 1.7–7.7)
RDW: 14.4 % (ref 11.5–15.5)
WBC: 6.8 10*3/uL (ref 4.0–10.5)

## 2013-04-29 LAB — URINALYSIS, ROUTINE W REFLEX MICROSCOPIC
Bilirubin Urine: NEGATIVE
Glucose, UA: NEGATIVE mg/dL
Hgb urine dipstick: NEGATIVE
Leukocytes, UA: NEGATIVE
Protein, ur: NEGATIVE mg/dL
Specific Gravity, Urine: <1.005 — ABNORMAL LOW (ref 1.005–1.030)
pH: 5.5 (ref 5.0–8.0)

## 2013-04-29 LAB — BASIC METABOLIC PANEL
BUN: 25 mg/dL — ABNORMAL HIGH (ref 6–23)
CO2: 27 mEq/L (ref 19–32)
Calcium: 9.4 mg/dL (ref 8.4–10.5)
Creatinine, Ser: 1.28 mg/dL — ABNORMAL HIGH (ref 0.50–1.10)
Glucose, Bld: 133 mg/dL — ABNORMAL HIGH (ref 70–99)
Potassium: 3.5 mEq/L (ref 3.5–5.1)

## 2013-04-29 LAB — TROPONIN I: Troponin I: <0.3 ng/mL (ref <0.30)

## 2013-04-29 MED ORDER — VITAMIN C 500 MG PO TABS
500.0000 mg | ORAL_TABLET | Freq: Two times a day (BID) | ORAL | Status: DC
  Administered 2013-04-29 – 2013-05-04 (×11): 500 mg via ORAL
  Filled 2013-04-29 (×11): qty 1

## 2013-04-29 MED ORDER — FUROSEMIDE 10 MG/ML IJ SOLN
40.0000 mg | Freq: Every day | INTRAMUSCULAR | Status: DC
  Administered 2013-04-30: 40 mg via INTRAVENOUS
  Filled 2013-04-29: qty 4

## 2013-04-29 MED ORDER — PRASUGREL HCL 10 MG PO TABS
5.0000 mg | ORAL_TABLET | Freq: Every day | ORAL | Status: DC
  Administered 2013-04-29 – 2013-05-04 (×6): 5 mg via ORAL
  Filled 2013-04-29 (×7): qty 1

## 2013-04-29 MED ORDER — VITAMIN B-12 1000 MCG PO TABS
1000.0000 ug | ORAL_TABLET | Freq: Every morning | ORAL | Status: DC
  Administered 2013-04-29 – 2013-05-04 (×6): 1000 ug via ORAL
  Filled 2013-04-29 (×6): qty 1

## 2013-04-29 MED ORDER — ROSUVASTATIN CALCIUM 20 MG PO TABS
10.0000 mg | ORAL_TABLET | Freq: Every day | ORAL | Status: DC
  Administered 2013-04-29 – 2013-05-03 (×5): 10 mg via ORAL
  Filled 2013-04-29 (×5): qty 1

## 2013-04-29 MED ORDER — MECLIZINE HCL 12.5 MG PO TABS: 25.0000 mg | ORAL_TABLET | Freq: Three times a day (TID) | ORAL | Status: DC | PRN

## 2013-04-29 MED ORDER — ASPIRIN EC 81 MG PO TBEC
81.0000 mg | DELAYED_RELEASE_TABLET | Freq: Every morning | ORAL | Status: DC
  Administered 2013-04-29 – 2013-05-04 (×6): 81 mg via ORAL
  Filled 2013-04-29 (×6): qty 1

## 2013-04-29 MED ORDER — GLUCERNA SHAKE PO LIQD
237.0000 mL | Freq: Two times a day (BID) | ORAL | Status: DC
  Administered 2013-05-04: 237 mL via ORAL

## 2013-04-29 MED ORDER — FUROSEMIDE 10 MG/ML IJ SOLN
40.0000 mg | Freq: Once | INTRAMUSCULAR | Status: AC
  Administered 2013-04-29: 40 mg via INTRAVENOUS
  Filled 2013-04-29: qty 4

## 2013-04-29 MED ORDER — AMITRIPTYLINE HCL 25 MG PO TABS
50.0000 mg | ORAL_TABLET | Freq: Every day | ORAL | Status: DC
  Administered 2013-04-29 – 2013-05-03 (×5): 50 mg via ORAL
  Filled 2013-04-29 (×5): qty 2

## 2013-04-29 MED ORDER — ACETAMINOPHEN 325 MG PO TABS
650.0000 mg | ORAL_TABLET | ORAL | Status: DC | PRN
  Administered 2013-05-04: 650 mg via ORAL
  Filled 2013-04-29: qty 2

## 2013-04-29 MED ORDER — LEVOTHYROXINE SODIUM 50 MCG PO TABS
50.0000 ug | ORAL_TABLET | Freq: Every day | ORAL | Status: DC
  Administered 2013-04-30 – 2013-05-04 (×5): 50 ug via ORAL
  Filled 2013-04-29 (×5): qty 1

## 2013-04-29 MED ORDER — HEPARIN SODIUM (PORCINE) 5000 UNIT/ML IJ SOLN
5000.0000 [IU] | Freq: Three times a day (TID) | INTRAMUSCULAR | Status: DC
  Administered 2013-04-29 – 2013-05-04 (×15): 5000 [IU] via SUBCUTANEOUS
  Filled 2013-04-29 (×15): qty 1

## 2013-04-29 MED ORDER — FERROUS SULFATE 325 (65 FE) MG PO TABS
325.0000 mg | ORAL_TABLET | Freq: Two times a day (BID) | ORAL | Status: DC
  Administered 2013-04-29 – 2013-05-04 (×11): 325 mg via ORAL
  Filled 2013-04-29 (×11): qty 1

## 2013-04-29 MED ORDER — ATORVASTATIN CALCIUM 40 MG PO TABS: 40.0000 mg | ORAL_TABLET | Freq: Every day | ORAL | Status: DC

## 2013-04-29 MED ORDER — PANTOPRAZOLE SODIUM 40 MG PO TBEC
40.0000 mg | DELAYED_RELEASE_TABLET | Freq: Every day | ORAL | Status: DC
  Administered 2013-04-29 – 2013-05-04 (×6): 40 mg via ORAL
  Filled 2013-04-29 (×6): qty 1

## 2013-04-29 MED ORDER — NITROGLYCERIN 0.4 MG SL SUBL: 0.4000 mg | SUBLINGUAL_TABLET | SUBLINGUAL | Status: DC | PRN

## 2013-04-29 MED ORDER — METOPROLOL TARTRATE 25 MG PO TABS
25.0000 mg | ORAL_TABLET | Freq: Two times a day (BID) | ORAL | Status: DC
  Administered 2013-04-29 – 2013-05-04 (×11): 25 mg via ORAL
  Filled 2013-04-29 (×11): qty 1

## 2013-04-29 MED ORDER — VITAMIN D 1000 UNITS PO TABS
1000.0000 [IU] | ORAL_TABLET | Freq: Every day | ORAL | Status: DC
  Administered 2013-04-29 – 2013-05-04 (×6): 1000 [IU] via ORAL
  Filled 2013-04-29 (×6): qty 1

## 2013-04-29 MED ORDER — SODIUM CHLORIDE 0.9 % IJ SOLN: 3.0000 mL | INTRAMUSCULAR | Status: DC | PRN

## 2013-04-29 MED ORDER — ONDANSETRON HCL 4 MG/2ML IJ SOLN
4.0000 mg | Freq: Four times a day (QID) | INTRAMUSCULAR | Status: DC | PRN
  Administered 2013-05-01: 4 mg via INTRAVENOUS
  Filled 2013-04-29: qty 2

## 2013-04-29 MED ORDER — SODIUM CHLORIDE 0.9 % IV SOLN
250.0000 mL | INTRAVENOUS | Status: DC | PRN
  Administered 2013-04-30: 250 mL via INTRAVENOUS

## 2013-04-29 MED ORDER — SODIUM CHLORIDE 0.9 % IJ SOLN
3.0000 mL | Freq: Two times a day (BID) | INTRAMUSCULAR | Status: DC
  Administered 2013-04-29 – 2013-05-04 (×11): 3 mL via INTRAVENOUS

## 2013-04-29 MED ORDER — HYDROXYZINE HCL 25 MG PO TABS
25.0000 mg | ORAL_TABLET | Freq: Four times a day (QID) | ORAL | Status: DC | PRN
  Administered 2013-05-01 (×2): 25 mg via ORAL
  Filled 2013-04-29 (×2): qty 1

## 2013-04-29 NOTE — ED Provider Notes (Signed)
CSN: [id number]     Arrival date & time 04/29/13  0325 History   First MD Initiated Contact with Patient 04/29/13 0357     Chief Complaint  Patient presents with  . Shortness of Breath   (Consider location/radiation/quality/duration/timing/severity/associated sxs/prior Treatment) HPI This is an 79 year old female with multiple medical problems. She is here with 2 days of shortness of breath. It is worsened to the point that she can barely walk across the room without getting out of breath. It has not been associated with chest pain. She has not done anything specifically to treat this. She is having some mild nausea but no vomiting, diarrhea, abdominal pain and fever. She has had chills. She has chronic xiphoid tenderness. She states her oxygen saturation has been in the mid 90s at rest but comes in to the 80s on exertion. This was confirmed in the ED when she ambulated to and from the restroom; her oxygen saturation dropped to 86%.  Past Medical History  Diagnosis Date  . Asthmatic bronchitis   . Sarcoidosis     a. 1990s of lung tx with prednisone temporarily.  [name] HTN (hypertension)   . PVD (peripheral vascular disease)     a. 06/2012 Carotid U/S: < 50% bilat ICA stenosis.  . Hyperlipemia   . DM (diabetes mellitus)   . Obesity   . GERD (gastroesophageal reflux disease)   . Diverticulosis of colon   . IBS (irritable bowel syndrome)   . DJD (degenerative joint disease)     Hip and knee pain; hip bursitis; patellar subluxation  . Vitamin D deficiency disease   . Fibromyalgia   . Anxiety     a. Guaiac neg 03/2012.  . Intermittent vertigo   . Anemia   . Venous insufficiency   . PSVT (paroxysmal supraventricular tachycardia)     a. Prior hx of palpitations - developed SVT during stress Myoview 09/2011. b. Noted on tele 03/2012.  . Valvular heart disease     a. moderately thickened/mildly calcified aortic valve, mod MR 03/20/12  . CKD (chronic kidney disease), stage II   . PAC  (premature atrial contraction)   . CAD (coronary artery disease) 03/2011    a. NSTEMI 03/2011 complicated by pulm edema s/p DES to subtotal LAD. b. [place] s/p PTCA/cutting balloon to prox LAD for ISR 12/01/11 (med rx for RCA/Cx dz).  c. NSTEMI 03/2012 s/p cutting angioplasty of ISR of prox LAD 03/21/12 (PLAVIX NONRESPONDER). d. DES to LAD for ISR 07/2012 (pt refused consideration of CABG).  [name] A-fib     a. Noted 07/2012 - not on anticoag due for need for DAPT and age.  . Thrombocytopenia     a. Noted 07/2012.  . On home oxygen therapy     2L/min with ambulation as of 07/2012.   Past Surgical History  Procedure Laterality Date  . Cholecystectomy    . Tonsillectomy    . Hernia repair    . Cataract extraction    . Coronary angioplasty with stent placement  03/21/2012   Family History  Problem Relation Age of Onset  . Heart failure Father     died at 18  . Leukemia Mother     died at 1   History  Substance Use Topics  . Smoking status: Never Smoker   . Smokeless tobacco: Never Used  . Alcohol Use: No     Comment: social   OB History   Grav Para Term Preterm Abortions TAB SAB Ect Mult Living  Review of Systems  All other systems reviewed and are negative.    Allergies  Codeine; Diazepam; Lipitor; Penicillins; Plavix; Sulfonamide derivatives; and Mango flavor  Home Medications   Current Outpatient Rx  Name  Route  Sig  Dispense  Refill  . acetaminophen (TYLENOL) 500 MG tablet   Oral   Take 1 tablet (500 mg total) by mouth every 6 (six) hours as needed for pain.         [name] amitriptyline (ELAVIL) 10 MG tablet      TAKE (1) TABLET BY MOUTH ONCE DAILY.   30 tablet   11   . ascorbic acid (VITAMIN C) 500 MG tablet   Oral   Take 500 mg by mouth 2 (two) times daily. Twice daily with iron tablet         . aspirin EC 81 MG tablet   Oral   Take 81 mg by mouth every morning.          . cholecalciferol (VITAMIN D) 1000 UNITS tablet   Oral   Take 1,000  Units by mouth daily.         [name] econazole nitrate 1 % cream   Topical   Apply 1 application topically daily as needed. FOR FEET IRRITATION         . EFFIENT 5 MG TABS tablet      TAKE 1 TABLET ONCE DAILY.   30 tablet   3   . ferrous sulfate 324 (65 FE) MG TBEC   Oral   Take 1 tablet by mouth 2 (two) times daily.          . furosemide (LASIX) 40 MG tablet   Oral   Take 40 mg by mouth daily.          [name] glimepiride (AMARYL) 1 MG tablet      Take 1/2 tablet by mouth daily   15 tablet   6   . glucose blood (ONE TOUCH TEST STRIPS) test strip      Test once daily. DX;  250.00   50 each   6   . hydrOXYzine (ATARAX/VISTARIL) 25 MG tablet      TAKE 1 TABLET EVERY 6 HOURS AS NEEDED FOR ITCHING OR ANXIETY.   30 tablet   5   . levothyroxine (SYNTHROID, LEVOTHROID) 50 MCG tablet   Oral   Take 1 tablet (50 mcg total) by mouth daily before breakfast.   30 tablet   11     Please deliver to the pt   . meclizine (ANTIVERT) 25 MG tablet   Oral   Take 1 tablet (25 mg total) by mouth 3 (three) times daily as needed for dizziness.   50 tablet   6   . metoprolol (LOPRESSOR) 50 MG tablet      Take 50 mg in the AM and 25 mg (1/2) tablet in the PM   45 tablet   6   . nitroGLYCERIN (NITROSTAT) 0.4 MG SL tablet   Sublingual   Place 1 tablet (0.4 mg total) under the tongue every 5 (five) minutes as needed for chest pain (up to 3 doses).   25 tablet   4     For chest pain   . ONE TOUCH LANCETS MISC      Test once daily. Dx:  250.00   200 each   6   . pantoprazole (PROTONIX) 40 MG tablet   Oral   Take 1 tablet (40 mg total) by mouth 2 (  two) times daily.   60 tablet   6   . rosuvastatin (CRESTOR) 10 MG tablet   Oral   Take 1 tablet (10 mg total) by mouth daily.   30 tablet   6   . pantoprazole (PROTONIX) 40 MG tablet      TAKE (1) TABLET BY MOUTH ONCE DAILY.         . vitamin B-12 (CYANOCOBALAMIN) 1000 MCG tablet   Oral   Take 1,000 mcg by mouth  every morning.          BP 133/63  Pulse 90  Temp(Src) 97.7 F (36.5 C)  Resp 20  Ht 5\' 6"  (1.676 m)  Wt 180 lb (81.647 kg)  BMI 29.07 kg/m2  SpO2 94%  Physical Exam General: Well-developed, well-nourished female in no acute distress; appearance consistent with age of record HENT: normocephalic; atraumatic Eyes: pupils equal, round and reactive to light; extraocular muscles intact Neck: supple Heart: Irregular rhythm Lungs: clear to auscultation bilaterally Abdomen: soft; nondistended; nontender; reducible umbilical hernia; bowel sounds present Extremities: No deformity; full range of motion; pulses normal; trace edema of ankles Neurologic: Awake, alert and oriented; motor function intact in all extremities and symmetric; no facial droop Skin: Warm and dry Psychiatric: Normal mood and affect    ED Course  Procedures (including critical care time)  MDM  Nursing notes and vitals signs, including pulse oximetry, reviewed.  Summary of this visit's results, reviewed by myself:  Labs:  Results for orders placed during the hospital encounter of 04/29/13 (from the past 24 hour(s))  CBC WITH DIFFERENTIAL     Status: Abnormal   Collection Time    04/29/13  4:00 AM      Result Value Range   WBC 6.8  4.0 - 10.5 K/uL   RBC 3.63 (*) 3.87 - 5.11 MIL/uL   Hemoglobin 10.6 (*) 12.0 - 15.0 g/dL   HCT 13.0 (*) 86.5 - 78.4 %   MCV 88.2  78.0 - 100.0 fL   MCH 29.2  26.0 - 34.0 pg   MCHC 33.1  30.0 - 36.0 g/dL   RDW 69.6  29.5 - 28.4 %   Platelets 190  150 - 400 K/uL   Neutrophils Relative % 73  43 - 77 %   Neutro Abs 4.9  1.7 - 7.7 K/uL   Lymphocytes Relative 15  12 - 46 %   Lymphs Abs 1.0  0.7 - 4.0 K/uL   Monocytes Relative 6  3 - 12 %   Monocytes Absolute 0.4  0.1 - 1.0 K/uL   Eosinophils Relative 5  0 - 5 %   Eosinophils Absolute 0.4  0.0 - 0.7 K/uL   Basophils Relative 0  0 - 1 %   Basophils Absolute 0.0  0.0 - 0.1 K/uL  BASIC METABOLIC PANEL     Status: Abnormal    Collection Time    04/29/13  4:00 AM      Result Value Range   Sodium 139  135 - 145 mEq/L   Potassium 3.5  3.5 - 5.1 mEq/L   Chloride 99  96 - 112 mEq/L   CO2 27  19 - 32 mEq/L   Glucose, Bld 133 (*) 70 - 99 mg/dL   BUN 25 (*) 6 - 23 mg/dL   Creatinine, Ser 1.32 (*) 0.50 - 1.10 mg/dL   Calcium 9.4  8.4 - 44.0 mg/dL   GFR calc non Af Amer 36 (*) >90 mL/min   GFR calc Af Denyse Dago  42 (*) >90 mL/min  TROPONIN I     Status: None   Collection Time    04/29/13  4:00 AM      Result Value Range   Troponin I <0.30  <0.30 ng/mL  PRO B NATRIURETIC PEPTIDE     Status: Abnormal   Collection Time    04/29/13  4:00 AM      Result Value Range   Pro B Natriuretic peptide (BNP) 5366.0 (*) 0 - 450 pg/mL  URINALYSIS, ROUTINE W REFLEX MICROSCOPIC     Status: Abnormal   Collection Time    04/29/13  4:00 AM      Result Value Range   Color, Urine YELLOW  YELLOW   APPearance CLEAR  CLEAR   Specific Gravity, Urine <1.005 (*) 1.005 - 1.030   pH 5.5  5.0 - 8.0   Glucose, UA NEGATIVE  NEGATIVE mg/dL   Hgb urine dipstick NEGATIVE  NEGATIVE   Bilirubin Urine NEGATIVE  NEGATIVE   Ketones, ur NEGATIVE  NEGATIVE mg/dL   Protein, ur NEGATIVE  NEGATIVE mg/dL   Urobilinogen, UA 0.2  0.0 - 1.0 mg/dL   Nitrite NEGATIVE  NEGATIVE   Leukocytes, UA NEGATIVE  NEGATIVE    Imaging Studies: Dg Chest 2 View  04/29/2013   CLINICAL DATA:  Shortness of breath.  EXAM: CHEST  2 VIEW  COMPARISON:  08/20/2012  FINDINGS: Cardiac enlargement with mild pulmonary vascular congestion. Hazy parenchymal opacities suggesting edema. No blunting of costophrenic angles. No pneumothorax. No focal consolidation. Calcification of the mitral valve annulus. Coronary artery stent.  IMPRESSION: Cardiac enlargement with mild pulmonary vascular congestion and central edema. No focal consolidation.   Electronically Signed   By: Burman Nieves M.D.   On: 04/29/2013 04:33    EKG Interpretation    Date/Time:  Saturday April 29 2013 03:53:16  EST Ventricular Rate:  84 PR Interval:    QRS Duration: 72 QT Interval:  374 QTC Calculation: 441 R Axis:   -15 Text Interpretation:  Atrial fibrillation Abnormal ECG When compared with ECG of 19-Aug-2012 18:55, ST no longer depressed in Lateral leads Confirmed by Rochell Mabie  MD, Anielle Headrick [id number]) on 04/29/2013 4:01:46 AM     5:59 AM Brisk diuresis after Lasix 40mg  IV.  6:49 AM Despite diuresis of 750 mL of urine patient is still dyspneic on exertion with oxygen saturations dropping to 85%. She was also lightheaded on standing. The hospitalist to admit.  Hanley Seamen, MD 04/29/13 [phone]

## 2013-04-29 NOTE — H&P (Signed)
[facility] History and Physical  Alexandra [name] ZOX:[id number] DOB: 1923/12/27 DOA: 04/29/2013  Referring physician: Emergency Department PCP: [name], MD  Specialists:   Chief Complaint: SOB  HPI: Alexandra [name] is a 79 y.o. female  With a hx of htn, DM, CAD, stage 2 ckd who presents to the ED with 2 days hx of increased SOB. In the ED, the patient was noted to have CXR findings suggestive of pulm edema. She was noted to have an elevated BNP of over 5300. The hospitalist service was consulted for admission.  Review of Systems:   Per above, the remainder of the 10pt ros reviewed and are neg  Past Medical History  Diagnosis Date  . Asthmatic bronchitis   . Sarcoidosis     a. 1990s of lung tx with prednisone temporarily.  [name] HTN (hypertension)   . PVD (peripheral vascular disease)     a. 06/2012 Carotid U/S: < 50% bilat ICA stenosis.  . Hyperlipemia   . DM (diabetes mellitus)   . Obesity   . GERD (gastroesophageal reflux disease)   . Diverticulosis of colon   . IBS (irritable bowel syndrome)   . DJD (degenerative joint disease)     Hip and knee pain; hip bursitis; patellar subluxation  . Vitamin D deficiency disease   . Fibromyalgia   . Anxiety     a. Guaiac neg 03/2012.  . Intermittent vertigo   . Anemia   . Venous insufficiency   . PSVT (paroxysmal supraventricular tachycardia)     a. Prior hx of palpitations - developed SVT during stress Myoview 09/2011. b. Noted on tele 03/2012.  . Valvular heart disease     a. moderately thickened/mildly calcified aortic valve, mod MR 03/20/12  . CKD (chronic kidney disease), stage II   . PAC (premature atrial contraction)   . CAD (coronary artery disease) 03/2011    a. NSTEMI 03/2011 complicated by pulm edema s/p DES to subtotal LAD. b. [place] s/p PTCA/cutting balloon to prox LAD for ISR 12/01/11 (med rx for RCA/Cx dz).  c. NSTEMI 03/2012 s/p cutting angioplasty of ISR of prox LAD 03/21/12 (PLAVIX NONRESPONDER). d. DES to LAD  for ISR 07/2012 (pt refused consideration of CABG).  [name] A-fib     a. Noted 07/2012 - not on anticoag due for need for DAPT and age.  . Thrombocytopenia     a. Noted 07/2012.  . On home oxygen therapy     2L/min with ambulation as of 07/2012.   Past Surgical History  Procedure Laterality Date  . Cholecystectomy    . Tonsillectomy    . Hernia repair    . Cataract extraction    . Coronary angioplasty with stent placement  03/21/2012   Social History:  reports that she has never smoked. She has never used smokeless tobacco. She reports that she does not drink alcohol or use illicit drugs.  where does patient live--home, ALF, SNF? and with whom if at home?  Can patient participate in ADLs?  Allergies  Allergen Reactions  . Codeine Other (See Comments)    Reaction: severe GI pain  . Diazepam Other (See Comments)    REACTION: causes pt unable to wake up after taking  . Lipitor [Atorvastatin Calcium] Other (See Comments)    Reaction: causes vertigo  . Penicillins Other (See Comments)    Reaction: unknown (childhood )  . Plavix [Clopidogrel Bisulfate]     Nonresponder  . Sulfonamide Derivatives Other (See Comments)    Reaction: unknown (  childhood)  . Mango Flavor Rash    Family History  Problem Relation Age of Onset  . Heart failure Father     died at 20  . Leukemia Mother     died at 79    (be sure to complete)  Prior to Admission medications   Medication Sig Start Date End Date Taking? Authorizing Provider  acetaminophen (TYLENOL) 500 MG tablet Take 1 tablet (500 mg total) by mouth every 6 (six) hours as needed for pain. 08/22/12  Yes Dayna N Dunn, PA-C  amitriptyline (ELAVIL) 10 MG tablet TAKE (1) TABLET BY MOUTH ONCE DAILY. 01/16/13  Yes [name], MD  ascorbic acid (VITAMIN C) 500 MG tablet Take 500 mg by mouth 2 (two) times daily. Twice daily with iron tablet   Yes Historical Provider, MD  aspirin EC 81 MG tablet Take 81 mg by mouth every morning.    Yes Historical  Provider, MD  cholecalciferol (VITAMIN D) 1000 UNITS tablet Take 1,000 Units by mouth daily.   Yes Historical Provider, MD  econazole nitrate 1 % cream Apply 1 application topically daily as needed. FOR FEET IRRITATION   Yes Historical Provider, MD  EFFIENT 5 MG TABS tablet TAKE 1 TABLET ONCE DAILY. 04/22/13  Yes Laqueta Linden, MD  ferrous sulfate 324 (65 FE) MG TBEC Take 1 tablet by mouth 2 (two) times daily.    Yes Historical Provider, MD  furosemide (LASIX) 40 MG tablet Take 40 mg by mouth daily.  08/15/12  Yes Historical Provider, MD  glimepiride (AMARYL) 1 MG tablet Take 1/2 tablet by mouth daily 10/21/12  Yes [name], MD  glucose blood (ONE TOUCH TEST STRIPS) test strip Test once daily. DX;  250.00 02/01/13  Yes [name], MD  hydrOXYzine (ATARAX/VISTARIL) 25 MG tablet TAKE 1 TABLET EVERY 6 HOURS AS NEEDED FOR ITCHING OR ANXIETY. 01/25/13  Yes [name], MD  levothyroxine (SYNTHROID, LEVOTHROID) 50 MCG tablet Take 1 tablet (50 mcg total) by mouth daily before breakfast. 10/26/12  Yes [name], MD  meclizine (ANTIVERT) 25 MG tablet Take 1 tablet (25 mg total) by mouth 3 (three) times daily as needed for dizziness. 10/21/12  Yes [name], MD  metoprolol (LOPRESSOR) 50 MG tablet Take 50 mg in the AM and 25 mg (1/2) tablet in the PM 10/31/12  Yes Jodelle Gross, NP  nitroGLYCERIN (NITROSTAT) 0.4 MG SL tablet Place 1 tablet (0.4 mg total) under the tongue every 5 (five) minutes as needed for chest pain (up to 3 doses). 09/02/12 09/02/13 Yes Jodelle Gross, NP  ONE TOUCH LANCETS MISC Test once daily. Dx:  250.00 02/01/13  Yes [name], MD  pantoprazole (PROTONIX) 40 MG tablet Take 1 tablet (40 mg total) by mouth 2 (two) times daily. 03/20/13  Yes Laqueta Linden, MD  rosuvastatin (CRESTOR) 10 MG tablet Take 1 tablet (10 mg total) by mouth daily. 12/15/12  Yes Laqueta Linden, MD  pantoprazole (PROTONIX) 40 MG tablet TAKE (1) TABLET BY MOUTH ONCE DAILY. 02/23/13    Jodelle Gross, NP  vitamin B-12 (CYANOCOBALAMIN) 1000 MCG tablet Take 1,000 mcg by mouth every morning.    Historical Provider, MD   Physical Exam: Filed Vitals:   04/29/13 0339 04/29/13 0340 04/29/13 0652 04/29/13 0700  BP:  133/63 154/78 133/84  Pulse: 90  86 83  Temp: 97.7 F (36.5 C)     Resp: 20  32 14  Height: 5\' 6"  (1.676  m)     Weight: 81.647 kg (180 lb)     SpO2: 94%  98% 98%     General:  Awake, in nad  Eyes: PERRL B  ENT: Membranes moist, dentition fair  Neck: trachea midline, neck supple  Cardiovascular: regular, s1, s2  Respiratory: normal resp effort, no wheezing  Abdomen: soft, nondistended  Skin: normal skin turgor, no abnormal skin lesions seen  Musculoskeletal: perfused, no clubbing  Psychiatric: mood/affect normal // no auditory/visual hallucinations  Neurologic: cn2-12 grossly intact, strength/sensation intact  Labs on Admission:  Basic Metabolic Panel:  Recent Labs Lab 04/29/13 0400  NA 139  K 3.5  CL 99  CO2 27  GLUCOSE 133*  BUN 25*  CREATININE 1.28*  CALCIUM 9.4   Liver Function Tests: No results found for this basename: AST, ALT, ALKPHOS, BILITOT, PROT, ALBUMIN,  in the last 168 hours No results found for this basename: LIPASE, AMYLASE,  in the last 168 hours No results found for this basename: AMMONIA,  in the last 168 hours CBC:  Recent Labs Lab 04/29/13 0400  WBC 6.8  NEUTROABS 4.9  HGB 10.6*  HCT 32.0*  MCV 88.2  PLT 190   Cardiac Enzymes:  Recent Labs Lab 04/29/13 0400  TROPONINI <0.30    BNP (last 3 results)  Recent Labs  04/29/13 0400  PROBNP 5366.0*   CBG: No results found for this basename: GLUCAP,  in the last 168 hours  Radiological Exams on Admission: Dg Chest 2 View  04/29/2013   CLINICAL DATA:  Shortness of breath.  EXAM: CHEST  2 VIEW  COMPARISON:  08/20/2012  FINDINGS: Cardiac enlargement with mild pulmonary vascular congestion. Hazy parenchymal opacities suggesting edema. No  blunting of costophrenic angles. No pneumothorax. No focal consolidation. Calcification of the mitral valve annulus. Coronary artery stent.  IMPRESSION: Cardiac enlargement with mild pulmonary vascular congestion and central edema. No focal consolidation.   Electronically Signed   By: Burman Nieves M.D.   On: 04/29/2013 04:33    EKG: Independently reviewed. afib  Assessment/Plan Principal Problem:   CHF exacerbation Active Problems:   Diabetes mellitus, type II   HYPERLIPIDEMIA   Hypertension   Atrial fibrillation   NSTEMI (non-ST elevated myocardial infarction)   1. Acute CHF exacerbation 1. Unclear if diastolic or systolic 2. Will order 2D echo 3. Cont on aggressive diuresis for now 4. Follow i/o and renal function 5. Admit to med/tele 2. DM 1. Cont on SSI 3. HTN 1. Stable 2. Cont current meds 4. afib 1. Rate controlled 2. Cont dual antiplatelet tx per cardiology's outpt recs 5. CAD 1. Stable 2. Cont current regimen 6. DVT prophylaxis 1. Heparin subQ  Code Status: Full (must indicate code status--if unknown or must be presumed, indicate so) Family Communication: Pt in room (indicate person spoken with, if applicable, with phone number if by telephone) Disposition Plan: Pending (indicate anticipated LOS)  Time spent:  Juliah Scadden K [facility] Pager [phone]  If 7PM-7AM, please contact night-coverage [email or web address] Password [facility] 04/29/2013, 7:41 AM

## 2013-04-29 NOTE — ED Notes (Signed)
Pt has had dyspnea on exertion for 2 days.

## 2013-04-29 NOTE — ED Notes (Signed)
Pt requesting Foley catheter, pt gets dizzy on standing and can't tolerate long sitting sessions on portable bedside toilet. Pt has discussed this with outgoing EDP. Pt understands the risk of catheter related infections and requested the catheter.

## 2013-04-29 NOTE — ED Notes (Signed)
Pt has been sitting on bedside commode due to pt being given 40mg  of lasix. Orders to ambulate pt with portable pulse ox received. Pt assisted with nurse, tech, and walker to ambulate to check pulse ox on room air. Pt became very dizzy and nauseated and assisted back down to bedside commode. Pt allowed to rest and we tried ambulating her again with success but her pulse ox on room air dropped to 83%. Dr. Read Drivers with [place] as we ambulated pt. Dr. Read Drivers then made the decision that pt would be admitted.

## 2013-04-30 DIAGNOSIS — D649 Anemia, unspecified: Secondary | ICD-10-CM

## 2013-04-30 LAB — BASIC METABOLIC PANEL
BUN: 21 mg/dL (ref 6–23)
CO2: 29 mEq/L (ref 19–32)
Calcium: 9.1 mg/dL (ref 8.4–10.5)
Creatinine, Ser: 1.06 mg/dL (ref 0.50–1.10)
Glucose, Bld: 134 mg/dL — ABNORMAL HIGH (ref 70–99)

## 2013-04-30 LAB — GLUCOSE, CAPILLARY
Glucose-Capillary: 154 mg/dL — ABNORMAL HIGH (ref 70–99)
Glucose-Capillary: 105 mg/dL — ABNORMAL HIGH (ref 70–99)

## 2013-04-30 MED ORDER — INSULIN ASPART 100 UNIT/ML ~~LOC~~ SOLN
0.0000 [IU] | Freq: Three times a day (TID) | SUBCUTANEOUS | Status: DC
  Administered 2013-04-30 – 2013-05-01 (×2): 3 [IU] via SUBCUTANEOUS
  Administered 2013-05-01: 5 [IU] via SUBCUTANEOUS
  Administered 2013-05-02 (×3): 2 [IU] via SUBCUTANEOUS
  Administered 2013-05-03: 3 [IU] via SUBCUTANEOUS
  Administered 2013-05-03: 5 [IU] via SUBCUTANEOUS
  Administered 2013-05-03 – 2013-05-04 (×3): 3 [IU] via SUBCUTANEOUS

## 2013-04-30 MED ORDER — INSULIN ASPART 100 UNIT/ML ~~LOC~~ SOLN: 0.0000 [IU] | Freq: Every day | SUBCUTANEOUS | Status: DC

## 2013-04-30 NOTE — Progress Notes (Signed)
[facility] PROGRESS NOTE  ABI SHOULTS BJY:[id number] DOB: 28-Nov-1923 DOA: 04/29/2013 PCP: [name], MD  Assessment/Plan: 1. Acute CHF exacerbation  1. Unclear if diastolic or systolic 2. Ordered 2D echo 3. Cont on diuresis for now 4. Follow i/o and renal function 5. Thus far, net -1.3L 6. Breathing improved 2. DM  1. Cont on SSI 3. HTN  1. Stable 2. Cont current meds 4. afib  1. Rate controlled 2. Cont dual antiplatelet tx per cardiology's outpt recs 5. CAD  1. Stable 2. Cont current regimen 6. DVT prophylaxis  1. Heparin subQ   Code Status: DNR Family Communication: Pt in room (indicate person spoken with, relationship, and if by phone, the number) Disposition Plan: Pending  HPI/Subjective: No acute events noted overnight  Objective: Filed Vitals:   04/29/13 2100 04/29/13 2144 04/29/13 2300 04/30/13 0417  BP:    136/61  Pulse:  87 82 84  Temp:    97.6 F (36.4 C)  TempSrc:    Oral  Resp: 20  20 20   Height:      Weight:    81.9 kg (180 lb 8.9 oz)  SpO2:  100% 98% 99%    Intake/Output Summary (Last 24 hours) at 04/30/13 0831 Last data filed at 04/30/13 0865  Gross per 24 hour  Intake    703 ml  Output   1150 ml  Net   -447 ml   Filed Weights   04/29/13 0339 04/29/13 0819 04/30/13 0417  Weight: 81.647 kg (180 lb) 81.7 kg (180 lb 1.9 oz) 81.9 kg (180 lb 8.9 oz)    Exam:  General:  Awake, in nad  Cardiovascular: regular, s1, s2  Respiratory: normal resp effort, no wheezing  Abdomen: soft, nondistended  Musculoskeletal: perfused, no clubbing   Data Reviewed: Basic Metabolic Panel:  Recent Labs Lab 04/29/13 0400 04/30/13 0554  NA 139 137  K 3.5 3.4*  CL 99 98  CO2 27 29  GLUCOSE 133* 134*  BUN 25* 21  CREATININE 1.28* 1.06  CALCIUM 9.4 9.1   Liver Function Tests: No results found for this basename: AST, ALT, ALKPHOS, BILITOT, PROT, ALBUMIN,  in the last 168 hours No results found for this basename: LIPASE, AMYLASE,   in the last 168 hours No results found for this basename: AMMONIA,  in the last 168 hours CBC:  Recent Labs Lab 04/29/13 0400  WBC 6.8  NEUTROABS 4.9  HGB 10.6*  HCT 32.0*  MCV 88.2  PLT 190   Cardiac Enzymes:  Recent Labs Lab 04/29/13 0400  TROPONINI <0.30   BNP (last 3 results)  Recent Labs  04/29/13 0400  PROBNP 5366.0*   CBG: No results found for this basename: GLUCAP,  in the last 168 hours  No results found for this or any previous visit (from the past 240 hour(s)).   Studies: Dg Chest 2 View  04/29/2013   CLINICAL DATA:  Shortness of breath.  EXAM: CHEST  2 VIEW  COMPARISON:  08/20/2012  FINDINGS: Cardiac enlargement with mild pulmonary vascular congestion. Hazy parenchymal opacities suggesting edema. No blunting of costophrenic angles. No pneumothorax. No focal consolidation. Calcification of the mitral valve annulus. Coronary artery stent.  IMPRESSION: Cardiac enlargement with mild pulmonary vascular congestion and central edema. No focal consolidation.   Electronically Signed   By: Burman Nieves M.D.   On: 04/29/2013 04:33    Scheduled Meds: . amitriptyline  50 mg Oral QHS  . aspirin EC  81 mg Oral q morning -  10a  . cholecalciferol  1,000 Units Oral Daily  . feeding supplement (GLUCERNA SHAKE)  237 mL Oral BID BM  . ferrous sulfate  325 mg Oral BID  . furosemide  40 mg Intravenous Daily  . heparin  5,000 Units Subcutaneous Q8H  . levothyroxine  50 mcg Oral QAC breakfast  . metoprolol  25 mg Oral BID  . pantoprazole  40 mg Oral Daily  . prasugrel  5 mg Oral Daily  . rosuvastatin  10 mg Oral q1800  . sodium chloride  3 mL Intravenous Q12H  . vitamin B-12  1,000 mcg Oral q morning - 10a  . ascorbic acid  500 mg Oral BID   Continuous Infusions:   Principal Problem:   CHF exacerbation Active Problems:   Diabetes mellitus, type II   HYPERLIPIDEMIA   Hypertension   Atrial fibrillation   NSTEMI (non-ST elevated myocardial infarction)   Acute CHF  (congestive heart failure)  Time spent:  CHIU, STEPHEN K  [facility] Pager [phone]. If 7PM-7AM, please contact night-coverage at [email or web address], password [facility] 04/30/2013, 8:31 AM  LOS: 1 day

## 2013-04-30 NOTE — Progress Notes (Signed)
Pt was walked in the hallway and with ambulation sats dropped to 88% on 2L. Will continue to monitor.

## 2013-05-01 DIAGNOSIS — I059 Rheumatic mitral valve disease, unspecified: Secondary | ICD-10-CM

## 2013-05-01 DIAGNOSIS — M1990 Unspecified osteoarthritis, unspecified site: Secondary | ICD-10-CM

## 2013-05-01 DIAGNOSIS — E119 Type 2 diabetes mellitus without complications: Secondary | ICD-10-CM

## 2013-05-01 LAB — BASIC METABOLIC PANEL
CO2: 30 mEq/L (ref 19–32)
Calcium: 9.1 mg/dL (ref 8.4–10.5)
Chloride: 96 mEq/L (ref 96–112)
Creatinine, Ser: 1.13 mg/dL — ABNORMAL HIGH (ref 0.50–1.10)
Glucose, Bld: 130 mg/dL — ABNORMAL HIGH (ref 70–99)
Sodium: 137 mEq/L (ref 135–145)

## 2013-05-01 LAB — GLUCOSE, CAPILLARY: Glucose-Capillary: 185 mg/dL — ABNORMAL HIGH (ref 70–99)

## 2013-05-01 MED ORDER — OXYCODONE-ACETAMINOPHEN 5-325 MG PO TABS
1.0000 | ORAL_TABLET | ORAL | Status: DC | PRN
  Administered 2013-05-01: 1 via ORAL
  Filled 2013-05-01: qty 1

## 2013-05-01 MED ORDER — GABAPENTIN 300 MG PO CAPS
300.0000 mg | ORAL_CAPSULE | Freq: Two times a day (BID) | ORAL | Status: DC
  Administered 2013-05-01 – 2013-05-04 (×7): 300 mg via ORAL
  Filled 2013-05-01 (×7): qty 1

## 2013-05-01 MED ORDER — FUROSEMIDE 40 MG PO TABS
40.0000 mg | ORAL_TABLET | Freq: Every day | ORAL | Status: DC
  Administered 2013-05-01 – 2013-05-02 (×2): 40 mg via ORAL
  Filled 2013-05-01 (×2): qty 1

## 2013-05-01 MED ORDER — POTASSIUM CHLORIDE CRYS ER 20 MEQ PO TBCR: 40.0000 meq | EXTENDED_RELEASE_TABLET | Freq: Two times a day (BID) | ORAL | Status: DC

## 2013-05-01 MED ORDER — POTASSIUM CHLORIDE CRYS ER 20 MEQ PO TBCR
40.0000 meq | EXTENDED_RELEASE_TABLET | Freq: Once | ORAL | Status: AC
  Administered 2013-05-01: 40 meq via ORAL
  Filled 2013-05-01: qty 2

## 2013-05-01 MED ORDER — POTASSIUM CHLORIDE 10 MEQ/100ML IV SOLN
10.0000 meq | INTRAVENOUS | Status: DC
  Administered 2013-05-01: 10 meq via INTRAVENOUS
  Filled 2013-05-01 (×2): qty 100

## 2013-05-01 NOTE — Progress Notes (Signed)
[facility] PROGRESS NOTE  Alexandra [name] ZOX:[id number] DOB: May 25, 1924 DOA: 04/29/2013 PCP: [name], MD  Assessment/Plan: 1. Acute CHF exacerbation  1. Unclear if diastolic or systolic 2. Ordered 2D echo - EF 65-70% 3. Transitioned to PO diuretics 4. Follow i/o and renal function 5. Thus far, net -2.9L since admit 6. Breathing improved 2. DM  1. Cont on SSI 3. HTN  1. Stable 2. Cont current meds 4. afib  1. Rate controlled 2. Cont dual antiplatelet tx per cardiology's outpt recs 5. CAD  1. Stable 2. Cont current regimen 6. DVT prophylaxis  1. Heparin subQ 7. Foot/shoulder pain 1. Suspect OA pains given hx of DJD 2. Tylenol PRN  Code Status: DNR Family Communication: Pt in room (indicate person spoken with, relationship, and if by phone, the number) Disposition Plan: Pending  HPI/Subjective: No acute events noted overnight  Objective: Filed Vitals:   04/30/13 0417 04/30/13 1445 04/30/13 2055 05/01/13 0623  BP: 136/61 129/52 117/47 124/53  Pulse: 84 88 94 81  Temp: 97.6 F (36.4 C) 98 F (36.7 C) 98.1 F (36.7 C) 97.4 F (36.3 C)  TempSrc: Oral Oral Oral Oral  Resp: 20 20 20 20   Height:      Weight: 81.9 kg (180 lb 8.9 oz)   82.3 kg (181 lb 7 oz)  SpO2: 99% 100% 100% 100%    Intake/Output Summary (Last 24 hours) at 05/01/13 1315 Last data filed at 05/01/13 0800  Gross per 24 hour  Intake    570 ml  Output   1900 ml  Net  -1330 ml   Filed Weights   04/29/13 0819 04/30/13 0417 05/01/13 0623  Weight: 81.7 kg (180 lb 1.9 oz) 81.9 kg (180 lb 8.9 oz) 82.3 kg (181 lb 7 oz)    Exam:  General:  Awake, in nad  Cardiovascular: regular, s1, s2  Respiratory: normal resp effort, no wheezing  Abdomen: soft, nondistended  Musculoskeletal: perfused, no clubbing   Data Reviewed: Basic Metabolic Panel:  Recent Labs Lab 04/29/13 0400 04/30/13 0554 05/01/13 0515  NA 139 137 137  K 3.5 3.4* 3.2*  CL 99 98 96  CO2 27 29 30   GLUCOSE 133*  134* 130*  BUN 25* 21 24*  CREATININE 1.28* 1.06 1.13*  CALCIUM 9.4 9.1 9.1   Liver Function Tests: No results found for this basename: AST, ALT, ALKPHOS, BILITOT, PROT, ALBUMIN,  in the last 168 hours No results found for this basename: LIPASE, AMYLASE,  in the last 168 hours No results found for this basename: AMMONIA,  in the last 168 hours CBC:  Recent Labs Lab 04/29/13 0400  WBC 6.8  NEUTROABS 4.9  HGB 10.6*  HCT 32.0*  MCV 88.2  PLT 190   Cardiac Enzymes:  Recent Labs Lab 04/29/13 0400  TROPONINI <0.30   BNP (last 3 results)  Recent Labs  04/29/13 0400  PROBNP 5366.0*   CBG:  Recent Labs Lab 04/30/13 1641 04/30/13 2102 05/01/13 0710 05/01/13 1109  GLUCAP 154* 105* 119* 185*    No results found for this or any previous visit (from the past 240 hour(s)).   Studies: No results found.  Scheduled Meds: . amitriptyline  50 mg Oral QHS  . aspirin EC  81 mg Oral q morning - 10a  . cholecalciferol  1,000 Units Oral Daily  . feeding supplement (GLUCERNA SHAKE)  237 mL Oral BID BM  . ferrous sulfate  325 mg Oral BID  . furosemide  40 mg Oral Daily  .  heparin  5,000 Units Subcutaneous Q8H  . insulin aspart  0-15 Units Subcutaneous TID WC  . insulin aspart  0-5 Units Subcutaneous QHS  . levothyroxine  50 mcg Oral QAC breakfast  . metoprolol  25 mg Oral BID  . pantoprazole  40 mg Oral Daily  . prasugrel  5 mg Oral Daily  . rosuvastatin  10 mg Oral q1800  . sodium chloride  3 mL Intravenous Q12H  . vitamin B-12  1,000 mcg Oral q morning - 10a  . ascorbic acid  500 mg Oral BID   Continuous Infusions:   Principal Problem:   CHF exacerbation Active Problems:   Diabetes mellitus, type II   HYPERLIPIDEMIA   Hypertension   Atrial fibrillation   NSTEMI (non-ST elevated myocardial infarction)   Acute CHF (congestive heart failure)  Time spent:  Anita Laguna K  [facility] Pager [phone]. If 7PM-7AM, please contact night-coverage at  [email or web address], password [facility] 05/01/2013, 1:15 PM  LOS: 2 days

## 2013-05-01 NOTE — Progress Notes (Signed)
UR chart review completed.  

## 2013-05-01 NOTE — Progress Notes (Signed)
*  PRELIMINARY RESULTS* Echocardiogram 2D Echocardiogram has been performed.  Dredyn Gubbels 05/01/2013, 9:22 AM

## 2013-05-02 ENCOUNTER — Ambulatory Visit: Payer: Medicare Other | Admitting: Pulmonary Disease

## 2013-05-02 ENCOUNTER — Inpatient Hospital Stay (HOSPITAL_COMMUNITY): Payer: Medicare Other

## 2013-05-02 DIAGNOSIS — J9600 Acute respiratory failure, unspecified whether with hypoxia or hypercapnia: Secondary | ICD-10-CM

## 2013-05-02 LAB — BASIC METABOLIC PANEL
BUN: 27 mg/dL — ABNORMAL HIGH (ref 6–23)
CO2: 29 mEq/L (ref 19–32)
GFR calc non Af Amer: 38 mL/min — ABNORMAL LOW (ref >90)
Glucose, Bld: 141 mg/dL — ABNORMAL HIGH (ref 70–99)
Potassium: 4.2 mEq/L (ref 3.5–5.1)
Sodium: 137 mEq/L (ref 135–145)

## 2013-05-02 LAB — GLUCOSE, CAPILLARY
Glucose-Capillary: 138 mg/dL — ABNORMAL HIGH (ref 70–99)
Glucose-Capillary: 138 mg/dL — ABNORMAL HIGH (ref 70–99)
Glucose-Capillary: 178 mg/dL — ABNORMAL HIGH (ref 70–99)

## 2013-05-02 LAB — PRO B NATRIURETIC PEPTIDE: Pro B Natriuretic peptide (BNP): 3557 pg/mL — ABNORMAL HIGH (ref 0–450)

## 2013-05-02 MED ORDER — FUROSEMIDE 10 MG/ML IJ SOLN
40.0000 mg | Freq: Every day | INTRAMUSCULAR | Status: DC
  Administered 2013-05-02 – 2013-05-03 (×2): 40 mg via INTRAVENOUS
  Filled 2013-05-02 (×2): qty 4

## 2013-05-02 NOTE — Progress Notes (Addendum)
INITIAL NUTRITION ASSESSMENT  DOCUMENTATION CODES Per approved criteria  -Not Applicable   INTERVENTION:  Provided Heart Healthy diet recommendations  RD will follow for nutrition care  NUTRITION DIAGNOSIS: Knowledge deficit related to Low Sodium diet    Goal: Pt to meet >/= 90% of their estimated nutrition needs   Monitor:  Po intake, labs and wt trends  Reason for Assessment: Malnutrition Screen Score = 3  79 y.o. female  Admitting Dx: CHF exacerbation  ASSESSMENT:  Pt says she has not been eating well however wt is stable at this time. She does have mild- moderate muscle wasting (clavicle, dorsal region). She is at risk for malnutrition. Adamantly refuses my recommendation to add oral nutrition supplement. RD provided "Low Sodium Nutrition Therapy" handout from the Academy of [employer] Dietetics. RD discussed why it is important for patient to adhere to diet recommendations.   Patient Active Problem List   Diagnosis Date Noted  . CHF exacerbation 04/29/2013  . Acute CHF (congestive heart failure) 04/29/2013  . NSTEMI (non-ST elevated myocardial infarction) 08/22/2012  . Atrial fibrillation 08/21/2012  . Arrhythmia 08/20/2012  . Cardiorenal syndrome 04/01/2012  . Chronic kidney disease, stage III (moderate) 03/30/2012  . Venous insufficiency (chronic) (peripheral) 12/07/2011  . OA (osteoarthritis) of knee 09/23/2011  . Anxiety 08/25/2011  . DJD (degenerative joint disease)   . Arteriosclerotic cardiovascular disease (ASCVD) 03/02/2011  . Cerebrovascular disease 09/23/2010  . VITAMIN D DEFICIENCY 02/26/2009  . Anemia, normocytic normochromic 10/18/2007  . Sarcoidosis 06/14/2007  . Diabetes mellitus, type II 06/14/2007  . HYPERLIPIDEMIA 06/14/2007  . Overweight 06/14/2007  . Hypertension 06/14/2007  . PERIPHERAL VASCULAR DISEASE 06/14/2007  . Irritable bowel syndrome 06/14/2007   Nutrition Focused Physical Exam:  Subcutaneous Fat:  Orbital Region:  WNL Upper Arm Region: WNL Thoracic and Lumbar Region: WNL  Muscle:  Temple Region: WNL Clavicle Bone Region: moderate wasting Clavicle and Acromion Bone Region: mild wasting Scapular Bone Region: not assessed Dorsal Hand: mild wasting Patellar Region: WNL  Anterior Thigh Region: WNL Posterior Calf Region: WNL  Edema: none noted  Height: Ht Readings from Last 1 Encounters:  04/29/13 5\' 6"  (1.676 m)    Weight: Wt Readings from Last 1 Encounters:  05/02/13 181 lb 7 oz (82.3 kg)    Ideal Body Weight: 130# (59 kg)  % Ideal Body Weight: 139%  Wt Readings from Last 10 Encounters:  05/02/13 181 lb 7 oz (82.3 kg)  03/20/13 184 lb 4 oz (83.575 kg)  01/24/13 184 lb (83.462 kg)  01/23/13 187 lb 9.6 oz (85.095 kg)  12/07/12 182 lb 6.4 oz (82.736 kg)  10/21/12 185 lb 9.6 oz (84.188 kg)  10/18/12 184 lb (83.462 kg)  09/23/12 182 lb 4 oz (82.668 kg)  09/09/12 188 lb 12.8 oz (85.639 kg)  09/02/12 190 lb 8 oz (86.41 kg)    Usual Body Weight: 182-187#  % Usual Body Weight: 99%  BMI:  Body mass index is 29.3 kg/(m^2).overweight  Estimated Nutritional Needs: Kcal: 1475-1770 Protein: 70-82 gr Fluid: per MD goals  Skin: intact  Diet Order: Cardiac  EDUCATION NEEDS: -Education needs addressed   Intake/Output Summary (Last 24 hours) at 05/02/13 0844 Last data filed at 05/01/13 1800  Gross per 24 hour  Intake    480 ml  Output    550 ml  Net    -70 ml    Last BM: 04/30/13  Labs:   Recent Labs Lab 04/30/13 0554 05/01/13 0515 05/02/13 0523  NA 137 137 137  K 3.4* 3.2* 4.2  CL 98 96 96  CO2 29 30 29   BUN 21 24* 27*  CREATININE 1.06 1.13* 1.24*  CALCIUM 9.1 9.1 9.3  GLUCOSE 134* 130* 141*    CBG (last 3)   Recent Labs  05/01/13 1626 05/01/13 2138 05/02/13 0754  GLUCAP 211* 171* 138*    Scheduled Meds: . amitriptyline  50 mg Oral QHS  . aspirin EC  81 mg Oral q morning - 10a  . cholecalciferol  1,000 Units Oral Daily  . feeding supplement  (GLUCERNA SHAKE)  237 mL Oral BID BM  . ferrous sulfate  325 mg Oral BID  . furosemide  40 mg Oral Daily  . gabapentin  300 mg Oral BID  . heparin  5,000 Units Subcutaneous Q8H  . insulin aspart  0-15 Units Subcutaneous TID WC  . insulin aspart  0-5 Units Subcutaneous QHS  . levothyroxine  50 mcg Oral QAC breakfast  . metoprolol  25 mg Oral BID  . pantoprazole  40 mg Oral Daily  . prasugrel  5 mg Oral Daily  . rosuvastatin  10 mg Oral q1800  . sodium chloride  3 mL Intravenous Q12H  . vitamin B-12  1,000 mcg Oral q morning - 10a  . ascorbic acid  500 mg Oral BID    Continuous Infusions:   Past Medical History  Diagnosis Date  . Asthmatic bronchitis   . Sarcoidosis     a. 1990s of lung tx with prednisone temporarily.  [name] HTN (hypertension)   . PVD (peripheral vascular disease)     a. 06/2012 Carotid U/S: < 50% bilat ICA stenosis.  . Hyperlipemia   . DM (diabetes mellitus)   . Obesity   . GERD (gastroesophageal reflux disease)   . Diverticulosis of colon   . IBS (irritable bowel syndrome)   . DJD (degenerative joint disease)     Hip and knee pain; hip bursitis; patellar subluxation  . Vitamin D deficiency disease   . Fibromyalgia   . Anxiety     a. Guaiac neg 03/2012.  . Intermittent vertigo   . Anemia   . Venous insufficiency   . PSVT (paroxysmal supraventricular tachycardia)     a. Prior hx of palpitations - developed SVT during stress Myoview 09/2011. b. Noted on tele 03/2012.  . Valvular heart disease     a. moderately thickened/mildly calcified aortic valve, mod MR 03/20/12  . CKD (chronic kidney disease), stage II   . PAC (premature atrial contraction)   . CAD (coronary artery disease) 03/2011    a. NSTEMI 03/2011 complicated by pulm edema s/p DES to subtotal LAD. b. [place] s/p PTCA/cutting balloon to prox LAD for ISR 12/01/11 (med rx for RCA/Cx dz).  c. NSTEMI 03/2012 s/p cutting angioplasty of ISR of prox LAD 03/21/12 (PLAVIX NONRESPONDER). d. DES to LAD for ISR 07/2012  (pt refused consideration of CABG).  [name] A-fib     a. Noted 07/2012 - not on anticoag due for need for DAPT and age.  . Thrombocytopenia     a. Noted 07/2012.  . On home oxygen therapy     2L/min with ambulation as of 07/2012.    Past Surgical History  Procedure Laterality Date  . Cholecystectomy    . Tonsillectomy    . Hernia repair    . Cataract extraction    . Coronary angioplasty with stent placement  03/21/2012    Royann Shivers MS,RD,CSG,LDN Office: [phone] Pager: [phone]

## 2013-05-02 NOTE — Care Management Note (Signed)
    Page 1 of 1   05/02/2013     11:38:31 AM   CARE MANAGEMENT NOTE 05/02/2013  Patient:  Alexandra [name], Alexandra [name]   Account Number:  [id number]  Date Initiated:  05/02/2013  Documentation initiated by:  Sharrie Rothman  Subjective/Objective Assessment:   Pt admitted from home with CHF. Pt lives alone but does have help that comes in daily to cook and clean. Pt also has close neighbors who check on pt frequently and take her to appts. Pt has rollator for home use.     Action/Plan:   Pt is agreeable to [facility] RN with [facility]. Alroy Bailiff of [facility] is aware. Will monitor for other discharge planning needs.   Anticipated DC Date:  05/03/2013   Anticipated DC Plan:  HOME W HOME HEALTH SERVICES      DC Planning Services  CM consult      Choice offered to / List presented to:             Status of service:  Completed, signed off Medicare Important Message given?   (If response is "NO", the following Medicare IM given date fields will be blank) Date Medicare IM given:   Date Additional Medicare IM given:    Discharge Disposition:  HOME W HOME HEALTH SERVICES  Per UR Regulation:    If discussed at Long Length of Stay Meetings, dates discussed:    Comments:  05/02/13 1137 Arlyss Queen, RN BSN CM

## 2013-05-02 NOTE — Progress Notes (Signed)
[facility] PROGRESS NOTE  Alexandra [name] ZOX:[id number] DOB: 1923/09/26 DOA: 04/29/2013 PCP: [name], MD  Assessment/Plan: 1. Acute CHF exacerbation  1. Likely diastolic 2. Ordered 2D echo - EF 65-70%, diastolic function was indeterminate  3. Transitioned to home PO diuretics 4. Follow i/o and renal function 5. Thus far, net -2.7L since admit 6. Breathing improved, sats appropriate at rest, but when attempted to sit up, pt noted to be hypoxic with o2 sats in the upper 70's. 7. Will obtain repeat CXR and BNP 8. May consider resuming IV diuretics, pending CXR findings 2. DM  1. Cont on SSI 3. HTN  1. Stable 2. Cont current meds 4. afib  1. Rate controlled 2. Cont dual antiplatelet tx per cardiology's outpt recs 5. CAD  1. Stable 2. Cont current regimen 6. DVT prophylaxis  1. Heparin subQ 7. Foot/shoulder pain  1. Suspect OA pains given hx of DJD 2. Tylenol PRN 3. Reports normally following up with a specialist for "injections"  Code Status: DNR Family Communication: Pt in room (indicate person spoken with, relationship, and if by phone, the number) Disposition Plan: Pending  HPI/Subjective: No acute events noted overnight  Objective: Filed Vitals:   05/01/13 1943 05/01/13 2135 05/02/13 0535 05/02/13 1019  BP:  126/57 109/69   Pulse:  86 89   Temp:  98.1 F (36.7 C) 97.6 F (36.4 C)   TempSrc:  Oral Oral   Resp:  20 20   Height:      Weight:   82.3 kg (181 lb 7 oz)   SpO2: 98% 100% 97% 98%    Intake/Output Summary (Last 24 hours) at 05/02/13 1319 Last data filed at 05/02/13 0848  Gross per 24 hour  Intake    480 ml  Output    550 ml  Net    -70 ml   Filed Weights   04/30/13 0417 05/01/13 0623 05/02/13 0535  Weight: 81.9 kg (180 lb 8.9 oz) 82.3 kg (181 lb 7 oz) 82.3 kg (181 lb 7 oz)    Exam:  General:  Awake, in nad  Cardiovascular: regular, s1, s2  Respiratory: normal resp effort, no wheezing  Abdomen: soft,  nondistended  Musculoskeletal: perfused, no clubbing   Data Reviewed: Basic Metabolic Panel:  Recent Labs Lab 04/29/13 0400 04/30/13 0554 05/01/13 0515 05/02/13 0523  NA 139 137 137 137  K 3.5 3.4* 3.2* 4.2  CL 99 98 96 96  CO2 27 29 30 29   GLUCOSE 133* 134* 130* 141*  BUN 25* 21 24* 27*  CREATININE 1.28* 1.06 1.13* 1.24*  CALCIUM 9.4 9.1 9.1 9.3   Liver Function Tests: No results found for this basename: AST, ALT, ALKPHOS, BILITOT, PROT, ALBUMIN,  in the last 168 hours No results found for this basename: LIPASE, AMYLASE,  in the last 168 hours No results found for this basename: AMMONIA,  in the last 168 hours CBC:  Recent Labs Lab 04/29/13 0400  WBC 6.8  NEUTROABS 4.9  HGB 10.6*  HCT 32.0*  MCV 88.2  PLT 190   Cardiac Enzymes:  Recent Labs Lab 04/29/13 0400  TROPONINI <0.30   BNP (last 3 results)  Recent Labs  04/29/13 0400  PROBNP 5366.0*   CBG:  Recent Labs Lab 05/01/13 1109 05/01/13 1626 05/01/13 2138 05/02/13 0754 05/02/13 1158  GLUCAP 185* 211* 171* 138* 138*    No results found for this or any previous visit (from the past 240 hour(s)).   Studies: No results found.  Scheduled  Meds: . amitriptyline  50 mg Oral QHS  . aspirin EC  81 mg Oral q morning - 10a  . cholecalciferol  1,000 Units Oral Daily  . feeding supplement (GLUCERNA SHAKE)  237 mL Oral BID BM  . ferrous sulfate  325 mg Oral BID  . furosemide  40 mg Oral Daily  . gabapentin  300 mg Oral BID  . heparin  5,000 Units Subcutaneous Q8H  . insulin aspart  0-15 Units Subcutaneous TID WC  . insulin aspart  0-5 Units Subcutaneous QHS  . levothyroxine  50 mcg Oral QAC breakfast  . metoprolol  25 mg Oral BID  . pantoprazole  40 mg Oral Daily  . prasugrel  5 mg Oral Daily  . rosuvastatin  10 mg Oral q1800  . sodium chloride  3 mL Intravenous Q12H  . vitamin B-12  1,000 mcg Oral q morning - 10a  . ascorbic acid  500 mg Oral BID   Continuous Infusions:   Principal  Problem:   CHF exacerbation Active Problems:   Diabetes mellitus, type II   HYPERLIPIDEMIA   Hypertension   Atrial fibrillation   NSTEMI (non-ST elevated myocardial infarction)   Acute CHF (congestive heart failure)  Time spent:  CHIU, STEPHEN K  [facility] Pager [phone]. If 7PM-7AM, please contact night-coverage at [email or web address], password [facility] 05/02/2013, 1:19 PM  LOS: 3 days

## 2013-05-02 NOTE — Progress Notes (Signed)
Oxygen removed, sats decreased to 77% room air, reapplied oxygen at 2L/[place], sats increased to 93% on 2L/[place].

## 2013-05-03 LAB — BASIC METABOLIC PANEL
BUN: 27 mg/dL — ABNORMAL HIGH (ref 6–23)
Calcium: 9.3 mg/dL (ref 8.4–10.5)
Creatinine, Ser: 1.19 mg/dL — ABNORMAL HIGH (ref 0.50–1.10)
GFR calc Af Amer: 46 mL/min — ABNORMAL LOW (ref >90)
GFR calc non Af Amer: 40 mL/min — ABNORMAL LOW (ref >90)
Glucose, Bld: 158 mg/dL — ABNORMAL HIGH (ref 70–99)
Sodium: 135 mEq/L (ref 135–145)

## 2013-05-03 LAB — CBC WITH DIFFERENTIAL/PLATELET
Basophils Absolute: 0 10*3/uL (ref 0.0–0.1)
Eosinophils Absolute: 0.1 10*3/uL (ref 0.0–0.7)
Lymphocytes Relative: 8 % — ABNORMAL LOW (ref 12–46)
Lymphs Abs: 0.7 10*3/uL (ref 0.7–4.0)
MCH: 28.7 pg (ref 26.0–34.0)
MCHC: 32.3 g/dL (ref 30.0–36.0)
MCV: 88.9 fL (ref 78.0–100.0)
Neutro Abs: 8.1 10*3/uL — ABNORMAL HIGH (ref 1.7–7.7)
Neutrophils Relative %: 85 % — ABNORMAL HIGH (ref 43–77)
Platelets: 197 10*3/uL (ref 150–400)
RBC: 3.52 MIL/uL — ABNORMAL LOW (ref 3.87–5.11)
RDW: 14.1 % (ref 11.5–15.5)
WBC: 9.6 10*3/uL (ref 4.0–10.5)

## 2013-05-03 LAB — GLUCOSE, CAPILLARY: Glucose-Capillary: 123 mg/dL — ABNORMAL HIGH (ref 70–99)

## 2013-05-03 MED ORDER — FUROSEMIDE 10 MG/ML IJ SOLN
40.0000 mg | Freq: Two times a day (BID) | INTRAMUSCULAR | Status: DC
  Administered 2013-05-03 – 2013-05-04 (×2): 40 mg via INTRAVENOUS
  Filled 2013-05-03 (×2): qty 4

## 2013-05-03 NOTE — Progress Notes (Signed)
[facility] PROGRESS NOTE  Alexandra [name] ZOX:[id number] DOB: Nov 22, 1923 DOA: 04/29/2013 PCP: [name], MD  Assessment/Plan: 1. Acute CHF exacerbation  1. Likely diastolic 2. Ordered 2D echo - EF 65-70%, diastolic function was indeterminate  3. Follow i/o and renal function 4. Patient still has evidence of volume overload on chest xray.  Continue on IV diuretics for now and wean off oxygen as tolerated. 2. DM  1. Cont on SSI 3. HTN  1. Stable 2. Cont current meds 4. afib  1. Rate controlled 2. Cont dual antiplatelet tx per cardiology's outpt recs 5. CAD  1. Stable 2. Cont current regimen 6. DVT prophylaxis  1. Heparin subQ 7. Foot/shoulder pain  1. Suspect OA pains given hx of DJD 2. Tylenol PRN 3. Reports normally following up with a specialist for "injections"  Code Status: DNR Family Communication: discussed with patient and sons at the bedside Disposition Plan: will request PT eval  HPI/Subjective: No acute events noted overnight  Objective: Filed Vitals:   05/02/13 2134 05/03/13 0444 05/03/13 0831 05/03/13 1513  BP: 126/52 114/69  115/70  Pulse: 106 100  99  Temp: 97.1 F (36.2 C) 99 F (37.2 C)  98 F (36.7 C)  TempSrc: Oral Oral  Oral  Resp: 20 20  20   Height:      Weight:  83.3 kg (183 lb 10.3 oz)    SpO2: 100% 100% 100% 100%    Intake/Output Summary (Last 24 hours) at 05/03/13 1648 Last data filed at 05/03/13 1250  Gross per 24 hour  Intake    600 ml  Output    250 ml  Net    350 ml   Filed Weights   05/01/13 0623 05/02/13 0535 05/03/13 0444  Weight: 82.3 kg (181 lb 7 oz) 82.3 kg (181 lb 7 oz) 83.3 kg (183 lb 10.3 oz)    Exam:  General:  Awake, in nad  Cardiovascular: regular, s1, s2  Respiratory: normal resp effort, no wheezing, some crackles at bases  Abdomen: soft, nondistended  Musculoskeletal: perfused, no clubbing, no edema   Data Reviewed: Basic Metabolic Panel:  Recent Labs Lab 04/29/13 0400 04/30/13 0554  05/01/13 0515 05/02/13 0523 05/03/13 0505  NA 139 137 137 137 135  K 3.5 3.4* 3.2* 4.2 3.7  CL 99 98 96 96 94*  CO2 27 29 30 29 30   GLUCOSE 133* 134* 130* 141* 158*  BUN 25* 21 24* 27* 27*  CREATININE 1.28* 1.06 1.13* 1.24* 1.19*  CALCIUM 9.4 9.1 9.1 9.3 9.3   Liver Function Tests: No results found for this basename: AST, ALT, ALKPHOS, BILITOT, PROT, ALBUMIN,  in the last 168 hours No results found for this basename: LIPASE, AMYLASE,  in the last 168 hours No results found for this basename: AMMONIA,  in the last 168 hours CBC:  Recent Labs Lab 04/29/13 0400 05/03/13 0505  WBC 6.8 9.6  NEUTROABS 4.9 8.1*  HGB 10.6* 10.1*  HCT 32.0* 31.3*  MCV 88.2 88.9  PLT 190 197   Cardiac Enzymes:  Recent Labs Lab 04/29/13 0400  TROPONINI <0.30   BNP (last 3 results)  Recent Labs  04/29/13 0400 05/02/13 0523  PROBNP 5366.0* 3557.0*   CBG:  Recent Labs Lab 05/02/13 1636 05/02/13 2049 05/03/13 0801 05/03/13 1137 05/03/13 1634  GLUCAP 125* 178* 165* 161* 203*    No results found for this or any previous visit (from the past 240 hour(s)).   Studies: Dg Chest Port 1 View  05/02/2013  CLINICAL DATA:  Hypoxia, history diabetes, coronary artery disease, atrial fibrillation, asthma, sarcoidosis, hypertension  EXAM: PORTABLE CHEST - 1 VIEW  COMPARISON:  Portable exam 1639 hr compared to 04/29/2013  FINDINGS: Enlargement of cardiac silhouette with pulmonary vascular congestion.  Prominent right hilum question related to sarcoidosis.  Perihilar infiltrates slightly greater on left question pulmonary edema and CHF.  No pleural effusion or pneumothorax.  Bones demineralized.  IMPRESSION: Enlargement of cardiac silhouette with pulmonary vascular congestion and question mild CHF.   Electronically Signed   By: Ulyses Southward M.D.   On: 05/02/2013 17:24    Scheduled Meds: . amitriptyline  50 mg Oral QHS  . aspirin EC  81 mg Oral q morning - 10a  . cholecalciferol  1,000 Units Oral  Daily  . feeding supplement (GLUCERNA SHAKE)  237 mL Oral BID BM  . ferrous sulfate  325 mg Oral BID  . furosemide  40 mg Intravenous Daily  . gabapentin  300 mg Oral BID  . heparin  5,000 Units Subcutaneous Q8H  . insulin aspart  0-15 Units Subcutaneous TID WC  . insulin aspart  0-5 Units Subcutaneous QHS  . levothyroxine  50 mcg Oral QAC breakfast  . metoprolol  25 mg Oral BID  . pantoprazole  40 mg Oral Daily  . prasugrel  5 mg Oral Daily  . rosuvastatin  10 mg Oral q1800  . sodium chloride  3 mL Intravenous Q12H  . vitamin B-12  1,000 mcg Oral q morning - 10a  . ascorbic acid  500 mg Oral BID   Continuous Infusions:   Principal Problem:   CHF exacerbation Active Problems:   Diabetes mellitus, type II   HYPERLIPIDEMIA   Hypertension   Atrial fibrillation   NSTEMI (non-ST elevated myocardial infarction)   Acute CHF (congestive heart failure)  Time spent:  Shunsuke Granzow  [facility] Pager [phone]. If 7PM-7AM, please contact night-coverage at [email or web address], password [facility] 05/03/2013, 4:48 PM  LOS: 4 days

## 2013-05-04 ENCOUNTER — Inpatient Hospital Stay
Admission: RE | Admit: 2013-05-04 | Discharge: 2013-05-22 | Disposition: A | Discharge status: Home / Self Care | Payer: Medicare Other | Source: Ambulatory Visit | Attending: Internal Medicine | Admitting: Internal Medicine

## 2013-05-04 LAB — BASIC METABOLIC PANEL
CO2: 32 mEq/L (ref 19–32)
Chloride: 92 mEq/L — ABNORMAL LOW (ref 96–112)
Creatinine, Ser: 1.23 mg/dL — ABNORMAL HIGH (ref 0.50–1.10)
Glucose, Bld: 166 mg/dL — ABNORMAL HIGH (ref 70–99)

## 2013-05-04 LAB — GLUCOSE, CAPILLARY
Glucose-Capillary: 151 mg/dL — ABNORMAL HIGH (ref 70–99)
Glucose-Capillary: 194 mg/dL — ABNORMAL HIGH (ref 70–99)

## 2013-05-04 NOTE — Evaluation (Signed)
Physical Therapy Evaluation Patient Details Name: Alexandra [name] MRN: [id number] DOB: 10-05-1923 Today's Date: 05/04/2013 Time: 1308-6578 PT Time Calculation (min): 49 min  PT Assessment / Plan / Recommendation History of Present Illness  Pt with a hx of DM,CAD,CKD is admitted for acute on chronic CHF.  She uses supplemental O2 for exertional activities and lives alone.  She has a housekeeper 3 days/week.  She ambulates with a walker.  Clinical Impression   Pt was seen for evaluation.  She has become completely bed bound during this hospitalization..per RN, she had been walking on Sunday with a walker in the room.  Her biggest problem beside severe weakness is that of pain in both feet with any pressure or movement at the foot or ankle.  She does have obvious erythema on the lateral aspect of both feet.  She will absolutely need SNF at d/c.   PT Assessment  Patient needs continued PT services    Follow Up Recommendations  SNF    Does the patient have the potential to tolerate intense rehabilitation      Barriers to Discharge Decreased caregiver support      Equipment Recommendations  None recommended by PT    Recommendations for Other Services     Frequency Min 3X/week    Precautions / Restrictions Precautions Precautions: Fall Restrictions Weight Bearing Restrictions: No   Pertinent Vitals/Pain       Mobility  Bed Mobility Bed Mobility: Sit to Supine Sit to Supine: 1: +2 Total assist Sit to Supine: Patient Percentage: 0% Transfers Transfers: Sit to Stand;Stand to Sit Sit to Stand: 1: +1 Total assist Stand to Sit: 1: +1 Total assist Details for Transfer Assistance: unable to get pt to standing...she becomes hysterical with any weight on her LEs and screams that she is falling... Ambulation/Gait Ambulation/Gait Assistance: Not tested (comment) (pt had been able to walk with a walker on Sunday)    Exercises General Exercises - Lower Extremity Ankle Circles/Pumps:  AROM;AAROM;Both;5 reps;Supine Short Arc Quad: AAROM;Both;Supine;10 reps Heel Slides: AAROM;Both;10 reps;Supine Hip ABduction/ADduction: AAROM;Both;10 reps;Supine   PT Diagnosis: Difficulty walking;Generalized weakness;Acute pain  PT Problem List: Decreased strength;Decreased range of motion;Decreased activity tolerance;Decreased mobility;Pain PT Treatment Interventions: Gait training;Functional mobility training;Therapeutic exercise;Patient/family education     PT Goals(Current goals can be found in the care plan section) Acute Rehab PT Goals Patient Stated Goal: none stated PT Goal Formulation: With patient Time For Goal Achievement: 05/18/13 Potential to Achieve Goals: Fair  Visit Information  Last PT Received On: 05/04/13 History of Present Illness: Pt with a hx of DM,CAD,CKD is admitted for acute on chronic CHF.  She uses supplemental O2 for exertional activities and lives alone.  She has a housekeeper 3 days/week.  She ambulates with a walker.       Prior Functioning  Home Living Family/patient expects to be discharged to:: Skilled nursing facility Prior Function Level of Independence: Independent with assistive device(s) Communication Communication: No difficulties    Cognition  Cognition Arousal/Alertness: Awake/alert Behavior During Therapy: WFL for tasks assessed/performed Overall Cognitive Status: Within Functional Limits for tasks assessed    Extremity/Trunk Assessment Lower Extremity Assessment Lower Extremity Assessment: RLE deficits/detail;LLE deficits/detail RLE Deficits / Details: strength generally 2/5 with pain in the foot with any active motion or pressure on any part of the foot...there is an area of erythema on the lateral surface of the foot RLE Sensation: history of peripheral neuropathy LLE Deficits / Details: strenght generally 2/5 with pain in the foot with any active  motion or pressure on any part of the foot...there is an area of erythema on the  lateral surface of the foot LLE Sensation: history of peripheral neuropathy Cervical / Trunk Assessment Cervical / Trunk Assessment: Kyphotic (severe)   Balance Balance Balance Assessed: No  End of Session PT - End of Session Equipment Utilized During Treatment: Gait belt Activity Tolerance: Patient limited by pain;Patient limited by fatigue Patient left: in bed;with call bell/phone within reach;with bed alarm set Nurse Communication: Mobility status;Need for lift equipment  GP     Yuval, [place]let 05/04/2013, 10:53 AM

## 2013-05-04 NOTE — Clinical Social Work Psychosocial (Signed)
Clinical Social Work Department BRIEF PSYCHOSOCIAL ASSESSMENT 05/04/2013  Patient:  Alexandra [name], Alexandra [name]     Account Number:  [id number]     Admit date:  04/29/2013  Clinical Social Worker:  Nancie Neas  Date/Time:  05/04/2013 11:25 AM  Referred by:  CSW  Date Referred:  05/04/2013 Referred for  SNF Placement   Other Referral:   Interview type:  Patient Other interview type:    PSYCHOSOCIAL DATA Living Status:  ALONE Admitted from facility:   Level of care:   Primary support name:  Edith/Dora Primary support relationship to patient:  FRIEND Degree of support available:   supportive per pt    CURRENT CONCERNS Current Concerns  Post-Acute Placement   Other Concerns:    SOCIAL WORK ASSESSMENT / PLAN CSW met with pt at bedside. Pt alert and oriented and reports she lives alone. She has one son who lives in [place] and pt indicates he left this morning to return home. Pt's best support here are two friends, Verlee Monte and Rubin Payor. They take pt to appointments, grocery store, etc as pt no longer drives. At baseline, pt ambulates using a rolling walker. She has no difficulty managing at home and cooks. Pt has someone come in to clean. PT evaluated pt this morning and pt will definitely need SNF. CSW discussed placement process and Medicare coverage/criteria. Pt requests PNC only at this time. SNF list provided.   Assessment/plan status:  Psychosocial Support/Ongoing Assessment of Needs Other assessment/ plan:   Information/referral to community resources:   SNF list    PATIENT'S/FAMILY'S RESPONSE TO PLAN OF CARE: Pt agreeable to [facility] at this point. CSW will fax out FL2 and follow up with pt.       Derenda Fennel, [place] [phone]

## 2013-05-04 NOTE — Clinical Social Work Placement (Signed)
Clinical Social Work Department CLINICAL SOCIAL WORK PLACEMENT NOTE 05/04/2013  Patient:  ALAISHA, EVERSLEY  Account Number:  [id number] Admit date:  04/29/2013  Clinical Social Worker:  Derenda Fennel, LCSW  Date/time:  05/04/2013 11:30 AM  Clinical Social Work is seeking post-discharge placement for this patient at the following level of care:   SKILLED NURSING   (*CSW will update this form in Epic as items are completed)   05/04/2013  Patient/family provided with [facility] Department of Clinical Social Work's list of facilities offering this level of care within the geographic area requested by the patient (or if unable, by the patient's family).  05/04/2013  Patient/family informed of their freedom to choose among providers that offer the needed level of care, that participate in Medicare, Medicaid or managed care program needed by the patient, have an available bed and are willing to accept the patient.  05/04/2013  Patient/family informed of [facility]' ownership interest in [facility], as well as of the fact that they are under no obligation to receive care at this facility.  PASARR submitted to EDS on  PASARR number received from EDS on   FL2 transmitted to all facilities in geographic area requested by pt/family on  05/04/2013 FL2 transmitted to all facilities within larger geographic area on   Patient informed that his/her managed care company has contracts with or will negotiate with  certain facilities, including the following:     Patient/family informed of bed offers received:  05/04/2013 Patient chooses bed at [facility] Physician recommends and patient chooses bed at  [facility]  Patient to be transferred to [facility] on  05/04/2013 Patient to be transferred to facility by RN  The following physician request were entered in Epic:   Additional Comments: Pt has existing pasarr number.  Derenda Fennel, [place] [phone]

## 2013-05-04 NOTE — Clinical Social Work Note (Signed)
CSW presented bed offer at [facility] and pt accepts. Facility notified. Pt to d/c today and will transfer with RN. D/C summary faxed. Out of facility DNR in packet.   Derenda Fennel, [place] [phone]

## 2013-05-04 NOTE — Clinical Social Work Placement (Signed)
Clinical Social Work Department CLINICAL SOCIAL WORK PLACEMENT NOTE 05/04/2013  Patient:  Alexandra [name], Alexandra [name]  Account Number:  [id number] Admit date:  04/29/2013  Clinical Social Worker:  Derenda Fennel, LCSW  Date/time:  05/04/2013 11:30 AM  Clinical Social Work is seeking post-discharge placement for this patient at the following level of care:   SKILLED NURSING   (*CSW will update this form in Epic as items are completed)   05/04/2013  Patient/family provided with [facility] Department of Clinical Social Work's list of facilities offering this level of care within the geographic area requested by the patient (or if unable, by the patient's family).  05/04/2013  Patient/family informed of their freedom to choose among providers that offer the needed level of care, that participate in Medicare, Medicaid or managed care program needed by the patient, have an available bed and are willing to accept the patient.  05/04/2013  Patient/family informed of [facility]' ownership interest in [facility], as well as of the fact that they are under no obligation to receive care at this facility.  PASARR submitted to EDS on  PASARR number received from EDS on   FL2 transmitted to all facilities in geographic area requested by pt/family on  05/04/2013 FL2 transmitted to all facilities within larger geographic area on   Patient informed that his/her managed care company has contracts with or will negotiate with  certain facilities, including the following:     Patient/family informed of bed offers received:   Patient chooses bed at  Physician recommends and patient chooses bed at    Patient to be transferred to  on   Patient to be transferred to facility by   The following physician request were entered in Epic:   Additional Comments: Pt has existing pasarr number.  Derenda Fennel, [place] [phone]

## 2013-05-04 NOTE — Discharge Summary (Signed)
Physician Discharge Summary  Alexandra [name]:[id number] DOB: September 13, 1923 DOA: 04/29/2013  PCP: [name], MD  Admit date: 04/29/2013 Discharge date: 05/04/2013  Time spent:  Recommendations for Outpatient Follow-up:  1. Discharge to [facility] center 2. Follow up with primary care doctor in 1-2 weeks 3. Needs repeat BMET in 3 days to ensure stability of renal function  Discharge Diagnoses:  Principal Problem:  Acute diastolic congestive heart failure Active Problems:   Diabetes mellitus, type II   HYPERLIPIDEMIA   Hypertension   Atrial fibrillation   Acute respiratory failure   Discharge Condition: improved   Diet recommendation: low salt  Filed Weights   05/02/13 0535 05/03/13 0444 05/04/13 0609  Weight: 82.3 kg (181 lb 7 oz) 83.3 kg (183 lb 10.3 oz) 80.6 kg (177 lb 11.1 oz)    History of present illness:  Alexandra [name] is a 79 y.o. female With a hx of htn, DM, CAD, stage 2 ckd who presents to the ED with 2 days hx of increased SOB. In the ED, the patient was noted to have CXR findings suggestive of pulm edema. She was noted to have an elevated BNP of over 5300. The hospitalist service was consulted for admission.   Hospital Course:  1. Acute CHF exacerbation  1. This was felt to be diastolic in nature.  Ejection fraction on echocardiogram was preserved. Patient was started on IV diuretics and had a good response. She's been weaned to 2 L of oxygen. She is breathing more comfortably. She's had good urine output. She's been transitioned to oral diuretics. BNP is trending down. She is advised to followup with her primary care physician next 1-2 weeks 2. DM  1. Cont on SSI 3. HTN  1. Stable 2. Cont current meds 4. afib  1. Rate controlled 2. Cont dual antiplatelet tx per cardiology's outpt recs 5. CAD  1. Stable 2. Cont current regimen 6. DVT prophylaxis  1. Heparin subQ 7. Foot/shoulder pain  1. Suspect OA pains given hx of DJD 2. Tylenol  PRN 3. Reports normally following up with a specialist for "injections" 4. Patient seen by physical therapy and recommended placement in a skilled nursing facility. Patient will be transferred to [facility] center later today.  Procedures: Echo: - Left ventricle: The cavity size was mildly reduced. Wall thickness was increased in a pattern of severe LVH. Systolic function was vigorous. The estimated ejection fraction was in the range of 65% to 70%. There is a mild intracavitary gradient created by cavity obliteration. Wall motion was normal; there were no regional wall motion abnormalities. Diastolic function is indeterminate. There is evidence of elevated left atrial pressure (E/e' 12) - Aortic valve: Moderately calcified annulus. Trileaflet; moderately thickened leaflets. There was mild stenosis. - Mitral valve: Moderately to severely calcified annulus. Moderately thickened leaflets . The findings are consistent with mild to moderate stenosis. - Left atrium: The atrium was severely dilated. - Right ventricle: The cavity size was mildly dilated. Wall thickness was increased. - Right atrium: The atrium was moderately to severely dilated. - Pulmonary arteries: Systolic pressure was severely increased. PA peak pressure: 75mm Hg (S). Transthoracic echocardiography. M-mode, complete     Consultations:  none  Discharge Exam: Filed Vitals:   05/04/13 0609  BP: 114/52  Pulse: 84  Temp: 97.3 F (36.3 C)  Resp: 15    General: NAD Cardiovascular: S1, S2 irregular Respiratory: crackles at bases  Discharge Instructions  Discharge Orders   Future Appointments Provider Department Dept Phone  05/09/2013 11:45 AM Vickki Hearing, MD [facility] [phone]   05/10/2013 11:30 AM Julio Sicks, NP [facility] [phone]   06/26/2013 2:00 PM Laqueta Linden, MD [facility] [phone]   Future Orders Complete By Expires    Call MD for:  difficulty breathing, headache or visual disturbances  As directed    Diet - low sodium heart healthy  As directed    Increase activity slowly  As directed        Medication List         acetaminophen 500 MG tablet  Commonly known as:  TYLENOL  Take 1 tablet (500 mg total) by mouth every 6 (six) hours as needed for pain.     amitriptyline 10 MG tablet  Commonly known as:  ELAVIL  TAKE (1) TABLET BY MOUTH ONCE DAILY.     ascorbic acid 500 MG tablet  Commonly known as:  VITAMIN C  Take 500 mg by mouth 2 (two) times daily. Twice daily with iron tablet     aspirin EC 81 MG tablet  Take 81 mg by mouth every morning.     cholecalciferol 1000 UNITS tablet  Commonly known as:  VITAMIN D  Take 1,000 Units by mouth daily.     econazole nitrate 1 % cream  Apply 1 application topically daily as needed. FOR FEET IRRITATION     EFFIENT 5 MG Tabs tablet  Generic drug:  prasugrel  TAKE 1 TABLET ONCE DAILY.     ferrous sulfate 324 (65 FE) MG Tbec  Take 1 tablet by mouth 2 (two) times daily.     furosemide 40 MG tablet  Commonly known as:  LASIX  Take 40 mg by mouth daily.     glimepiride 1 MG tablet  Commonly known as:  AMARYL  Take 1/2 tablet by mouth daily     glucose blood test strip  Commonly known as:  ONE TOUCH TEST STRIPS  Test once daily. DX;  250.00     hydrOXYzine 25 MG tablet  Commonly known as:  ATARAX/VISTARIL  TAKE 1 TABLET EVERY 6 HOURS AS NEEDED FOR ITCHING OR ANXIETY.     levothyroxine 50 MCG tablet  Commonly known as:  SYNTHROID, LEVOTHROID  Take 1 tablet (50 mcg total) by mouth daily before breakfast.     meclizine 25 MG tablet  Commonly known as:  ANTIVERT  Take 1 tablet (25 mg total) by mouth 3 (three) times daily as needed for dizziness.     metoprolol 50 MG tablet  Commonly known as:  LOPRESSOR  Take 50 mg in the AM and 25 mg (1/2) tablet in the PM     nitroGLYCERIN 0.4 MG SL tablet  Commonly known as:  NITROSTAT  Place 1  tablet (0.4 mg total) under the tongue every 5 (five) minutes as needed for chest pain (up to 3 doses).     ONE TOUCH LANCETS Misc  Test once daily. Dx:  250.00     pantoprazole 40 MG tablet  Commonly known as:  PROTONIX  TAKE (1) TABLET BY MOUTH ONCE DAILY.     rosuvastatin 10 MG tablet  Commonly known as:  CRESTOR  Take 1 tablet (10 mg total) by mouth daily.     vitamin B-12 1000 MCG tablet  Commonly known as:  CYANOCOBALAMIN  Take 1,000 mcg by mouth every morning.       Allergies  Allergen Reactions  . Codeine Other (See Comments)  Reaction: severe GI pain  . Diazepam Other (See Comments)    REACTION: causes pt unable to wake up after taking  . Lipitor [Atorvastatin Calcium] Other (See Comments)    Reaction: causes vertigo  . Penicillins Other (See Comments)    Reaction: unknown (childhood )  . Plavix [Clopidogrel Bisulfate]     Nonresponder  . Sulfonamide Derivatives Other (See Comments)    Reaction: unknown (childhood)  . Mango Flavor Rash       Follow-up Information   Follow up with NADEL,SCOTT M, MD. Schedule an appointment as soon as possible for a visit in 1 week.   Specialty:  Pulmonary Disease   Contact information:   [address] [place] [address] [phone]       Follow up with [facility].   Contact information:   [address] [place] [address] [phone]       The results of significant diagnostics from this hospitalization (including imaging, microbiology, ancillary and laboratory) are listed below for reference.    Significant Diagnostic Studies: Dg Chest 2 View  04/29/2013   CLINICAL DATA:  Shortness of breath.  EXAM: CHEST  2 VIEW  COMPARISON:  08/20/2012  FINDINGS: Cardiac enlargement with mild pulmonary vascular congestion. Hazy parenchymal opacities suggesting edema. No blunting of costophrenic angles. No pneumothorax. No focal consolidation. Calcification of the mitral valve annulus. Coronary artery stent.   IMPRESSION: Cardiac enlargement with mild pulmonary vascular congestion and central edema. No focal consolidation.   Electronically Signed   By: Burman Nieves M.D.   On: 04/29/2013 04:33   Dg Chest Port 1 View  05/02/2013   CLINICAL DATA:  Hypoxia, history diabetes, coronary artery disease, atrial fibrillation, asthma, sarcoidosis, hypertension  EXAM: PORTABLE CHEST - 1 VIEW  COMPARISON:  Portable exam 1639 hr compared to 04/29/2013  FINDINGS: Enlargement of cardiac silhouette with pulmonary vascular congestion.  Prominent right hilum question related to sarcoidosis.  Perihilar infiltrates slightly greater on left question pulmonary edema and CHF.  No pleural effusion or pneumothorax.  Bones demineralized.  IMPRESSION: Enlargement of cardiac silhouette with pulmonary vascular congestion and question mild CHF.   Electronically Signed   By: Ulyses Southward M.D.   On: 05/02/2013 17:24    Microbiology: No results found for this or any previous visit (from the past 240 hour(s)).   Labs: Basic Metabolic Panel:  Recent Labs Lab 04/30/13 0554 05/01/13 0515 05/02/13 0523 05/03/13 0505 05/04/13 0502  NA 137 137 137 135 134*  K 3.4* 3.2* 4.2 3.7 3.6  CL 98 96 96 94* 92*  CO2 29 30 29 30  32  GLUCOSE 134* 130* 141* 158* 166*  BUN 21 24* 27* 27* 33*  CREATININE 1.06 1.13* 1.24* 1.19* 1.23*  CALCIUM 9.1 9.1 9.3 9.3 9.6   Liver Function Tests: No results found for this basename: AST, ALT, ALKPHOS, BILITOT, PROT, ALBUMIN,  in the last 168 hours No results found for this basename: LIPASE, AMYLASE,  in the last 168 hours No results found for this basename: AMMONIA,  in the last 168 hours CBC:  Recent Labs Lab 04/29/13 0400 05/03/13 0505  WBC 6.8 9.6  NEUTROABS 4.9 8.1*  HGB 10.6* 10.1*  HCT 32.0* 31.3*  MCV 88.2 88.9  PLT 190 197   Cardiac Enzymes:  Recent Labs Lab 04/29/13 0400  TROPONINI <0.30   BNP: BNP (last 3 results)  Recent Labs  04/29/13 0400 05/02/13 0523  PROBNP  5366.0* 3557.0*   CBG:  Recent Labs Lab  05/03/13 1137 05/03/13 1634 05/03/13 2128 05/04/13 0800 05/04/13 1130  GLUCAP 161* 203* 123* 151* 194*       Signed:  MEMON,JEHANZEB  [facility] 05/04/2013, 1:23 PM

## 2013-05-04 NOTE — Progress Notes (Signed)
Patient was 75% on room air at rest.

## 2013-05-05 ENCOUNTER — Telehealth: Payer: Self-pay | Admitting: Pulmonary Disease

## 2013-05-05 LAB — GLUCOSE, CAPILLARY
Glucose-Capillary: 187 mg/dL — ABNORMAL HIGH (ref 70–99)
Glucose-Capillary: 131 mg/dL — ABNORMAL HIGH (ref 70–99)

## 2013-05-05 NOTE — Progress Notes (Signed)
UR chart review completed.  

## 2013-05-05 NOTE — Telephone Encounter (Signed)
Called and spoke with Alexandra [name] at [facility].  She stated that the pt will be there for at least another 2-3 wks.  She was d/c from the hospital and sent to rehab but they wanted her to follow up with SN.  appt has been scheduled for pt to see SN on 12/18 at 11:30.  Nothing further is needed.

## 2013-05-06 LAB — GLUCOSE, CAPILLARY: Glucose-Capillary: 151 mg/dL — ABNORMAL HIGH (ref 70–99)

## 2013-05-07 LAB — GLUCOSE, CAPILLARY
Glucose-Capillary: 152 mg/dL — ABNORMAL HIGH (ref 70–99)
Glucose-Capillary: 118 mg/dL — ABNORMAL HIGH (ref 70–99)
Glucose-Capillary: 166 mg/dL — ABNORMAL HIGH (ref 70–99)

## 2013-05-08 ENCOUNTER — Non-Acute Institutional Stay (SKILLED_NURSING_FACILITY): Payer: Medicare Other | Admitting: Internal Medicine

## 2013-05-08 DIAGNOSIS — I5031 Acute diastolic (congestive) heart failure: Secondary | ICD-10-CM

## 2013-05-08 DIAGNOSIS — J45909 Unspecified asthma, uncomplicated: Secondary | ICD-10-CM

## 2013-05-08 DIAGNOSIS — I509 Heart failure, unspecified: Secondary | ICD-10-CM

## 2013-05-08 DIAGNOSIS — I4891 Unspecified atrial fibrillation: Secondary | ICD-10-CM

## 2013-05-08 NOTE — Progress Notes (Signed)
Patient ID: Alexandra [name], female   DOB: 03-20-1924, 79 y.o.   MRN: [id number]  Facility; [facility] Chief complaint; admission to SNF post admit to [facility] from November 29 to December 4  History; this is an 79 year old woman who tells me she has her own pulse oximeter at home. When her pulse ox fell into the 70s she called the EMS and ultimately was admitted to hospital with acute diastolic congestive heart failure. She had an echocardiogram that showed a good EF. She was IV given IV diuretics and had a good response. She was weaned to 2 L of oxygen per initial BNP was 5300.   Past Medical History  Diagnosis Date  . Asthmatic bronchitis   . Sarcoidosis     a. 1990s of lung tx with prednisone temporarily.  [name] HTN (hypertension)   . PVD (peripheral vascular disease)     a. 06/2012 Carotid U/S: < 50% bilat ICA stenosis.  . Hyperlipemia   . DM (diabetes mellitus)   . Obesity   . GERD (gastroesophageal reflux disease)   . Diverticulosis of colon   . IBS (irritable bowel syndrome)   . DJD (degenerative joint disease)     Hip and knee pain; hip bursitis; patellar subluxation  . Vitamin D deficiency disease   . Fibromyalgia   . Anxiety     a. Guaiac neg 03/2012.  . Intermittent vertigo   . Anemia   . Venous insufficiency   . PSVT (paroxysmal supraventricular tachycardia)     a. Prior hx of palpitations - developed SVT during stress Myoview 09/2011. b. Noted on tele 03/2012.  . Valvular heart disease     a. moderately thickened/mildly calcified aortic valve, mod MR 03/20/12  . CKD (chronic kidney disease), stage II   . PAC (premature atrial contraction)   . CAD (coronary artery disease) 03/2011    a. NSTEMI 03/2011 complicated by pulm edema s/p DES to subtotal LAD. b. [place] s/p PTCA/cutting balloon to prox LAD for ISR 12/01/11 (med rx for RCA/Cx dz).  c. NSTEMI 03/2012 s/p cutting angioplasty of ISR of prox LAD 03/21/12 (PLAVIX NONRESPONDER). d. DES to LAD for ISR 07/2012 (pt refused  consideration of CABG).  [name] A-fib     a. Noted 07/2012 - not on anticoag due for need for DAPT and age.  . Thrombocytopenia     a. Noted 07/2012.  . On home oxygen therapy     2L/min with ambulation as of 07/2012.   Past Surgical History  Procedure Laterality Date  . Cholecystectomy    . Tonsillectomy    . Hernia repair    . Cataract extraction    . Coronary angioplasty with stent placement  03/21/2012   Medications; Elavil 10 mg daily, who enteric-coated aspirin 81 daily, vitamin D 1000 units daily, Effient 5mg  daily, sulfate 325 twice a day, Lasix 40 mg daily, Amaryl 1 mg a half a tablet daily, hydroxyzine 25 every 6 hours when necessary, metoprolol 50 mg in the morning and 20 5 in the AmPm, nitroglycerin 0.4 sublingual when necessary, Protonix 40 mg daily, Crestor 10 daily, vitamin B12 thousand daily  Social History: She lives on her own and [place]. Has a caregiver who comes in 3 times a week. She uses a walker or with a sit down platform although she tells me she also has a cane and a lift chair. Son and his family lives in [place].  reports that she has never smoked. She has never used smokeless tobacco.  She reports that she does not drink alcohol or use illicit drugs.  family history includes Heart failure in her father; Leukemia in her mother.  Review of systems Respiratory; states she has been followed by pulmonary for years for sarcoidosis and asthma. Currently no cough or shortness of breath. She was not on oxygen at home Cardiac no complaints of chest pain or palpitations GI no abdominal pain or diarrhea GU no dysuria. Claims she is continent;  Physical examination Gen. pleasant elderly woman in no distress O2 sat 98% on 2 L pulse 96 respirations 18 Respiratory significant thoracic kyphosis however air entry is fairly clear without wheezing or crackles. Work of breathing is normal no accessory muscle use Cardiac; heart sounds are normal there is a 2/6 pansystolic  murmur over the PMI Ritch radiates to the left axilla. JVP is elevated at 45 Abdomen; somewhat distended however no liver no spleen no tenderness bowel sounds are positive GU no suprapubic fullness or tenderness. No CVA tenderness Extremities; osteoarthritis of both knees 4/5 hip flexor and abductor strength. Skin no pressure ulcers are seen  Impression/plan #1 diastolically mediated heart failure. She has lost weight from 176.1-170.2 pounds this since her admission here. It would appear that her initial weight on admission to hospital was 181 pounds. As her weight currently is 170.2 pounds I think we can continue her on the same dose of Lasix. Her echocardiogram paradoxically shows mild to moderate mitral stenosis with a severely dilated left atrium and pulmonary hypertension. She is on oxygen 2 L and it may be reasonable to continue this secondary to the pulmonary hypertension #2 she is somewhat tachycardic has a history of atrial fibrillation not felt to be an anticoagulation candidate she is on aspirin 81 daily, #3 asthmatic bronchitis there is no current evidence of this. #4 coronary artery disease with atrial fibrillation. No evidence that this is active Hypertension we'll monitor while she is here. #5 degenerative joint disease #6 type 2 diabetes on small dose of Amaryl on monitor this while she is here  Major issue here would seem to be her diastolically mediated heart failure which is currently stable. She is somewhat frail with lower extremity weakness Zorcor therapy. She also has an absence of significant home support most of her family living in [place]   LV EF: 65% -   70%  ------------------------------------------------------------ Indications:      CHF - 428.0.  ------------------------------------------------------------ History:   PMH:   Atrial fibrillation.  Coronary artery disease.  Congestive heart failure.  Stroke.  PMH: Myocardial infarction.  Risk factors:   Sarcoidosis Hypertension. Diabetes mellitus. Dyslipidemia.  ------------------------------------------------------------ Study Conclusions  - Study data: Technically difficult study. - Left ventricle: The cavity size was mildly reduced. Wall   thickness was increased in a pattern of severe LVH.   Systolic function was vigorous. The estimated ejection   fraction was in the range of 65% to 70%. There is a mild   intracavitary gradient created by cavity obliteration.   Wall motion was normal; there were no regional wall motion   abnormalities. Diastolic function is indeterminate. There   is evidence of elevated left atrial pressure (E/e' 12) - Aortic valve: Moderately calcified annulus. Trileaflet;   moderately thickened leaflets. There was mild stenosis. - Mitral valve: Moderately to severely calcified annulus.   Moderately thickened leaflets . The findings are   consistent with mild to moderate stenosis. - Left atrium: The atrium was severely dilated. - Right ventricle: The cavity size was mildly dilated.  Wall   thickness was increased. - Right atrium: The atrium was moderately to severely   dilated. - Pulmonary arteries: Systolic pressure was severely   increased. PA peak pressure: 75mm Hg (S). Transthoracic echocardiography.  M-mode, complete 2D, spectral Doppler, and color Doppler.  Height:  Height: 167.6cm. Height: 66in.  Weight:  Weight: 82.3kg. Weight: 181.1lb.  Body mass index:  BMI: 29.3kg/m^2.  Body surface area:    BSA: 1.12m^2.  Blood pressure:     136/61.  Patient status:  Inpatient.  Location:  Bedside.  ------------------------------------------------------------  ------------------------------------------------------------ Left ventricle:  The cavity size was mildly reduced. Wall thickness was increased in a pattern of severe LVH. Systolic function was vigorous. The estimated ejection fraction was in the range of 65% to 70%. There is a mild  intracavitary gradient created by cavity obliteration Wall motion was normal; there were no regional wall motion abnormalities. Diastolic function is indeterminate. There is evidence of elevated left atrial pressure (E/e' 12)  ------------------------------------------------------------ Aortic valve:   Moderately calcified annulus. Trileaflet; moderately thickened leaflets.  Doppler:   There was mild stenosis.    No significant regurgitation.    Valve area: 1.49cm^2(VTI). Indexed valve area: 0.78cm^2/m^2 (VTI). Valve area: 1.56cm^2 (Vmax). Indexed valve area: 0.81cm^2/m^2 (Vmax).    Mean gradient: 13mm Hg (S). Peak gradient: 17mm Hg (S).  ------------------------------------------------------------ Aorta:  Aortic root: The aortic root was normal in size and calcified.  ------------------------------------------------------------ Mitral valve:   Moderately to severely calcified annulus. Moderately thickened leaflets .  Doppler:   The findings are consistent with mild to moderate stenosis.    No significant regurgitation.    Valve area by continuity equation (using LVOT flow): 1.34cm^2. Indexed valve area by continuity equation (using LVOT flow): 0.7cm^2/m^2.    Mean gradient: 5mm Hg (D). Peak gradient: 20mm Hg (D).  ------------------------------------------------------------ Left atrium:  The atrium was severely dilated.  ------------------------------------------------------------ Right ventricle:  The cavity size was mildly dilated. Wall thickness was increased. Systolic function was normal.   ------------------------------------------------------------ Pulmonic valve:   Not well visualized.  Doppler:   There was no evidence for stenosis.    No significant regurgitation.   ------------------------------------------------------------ Tricuspid valve:   The valve appears to be grossly normal.  Doppler:   There was no evidence for stenosis.     Mild regurgitation.  ------------------------------------------------------------ Pulmonary artery:   Systolic pressure was severely increased.  ------------------------------------------------------------ Right atrium:  The atrium was moderately to severely dilated.  ------------------------------------------------------------ Pericardium:  There was no pericardial effusion.  ------------------------------------------------------------ Systemic veins: Inferior vena cava: The vessel was normal in size; the respirophasic diameter changes were in the normal range (= 50%); findings are consistent with normal central venous pressure.

## 2013-05-09 ENCOUNTER — Ambulatory Visit: Payer: Medicare Other | Admitting: Orthopedic Surgery

## 2013-05-09 ENCOUNTER — Telehealth: Payer: Self-pay | Admitting: Orthopedic Surgery

## 2013-05-09 LAB — GLUCOSE, CAPILLARY: Glucose-Capillary: 174 mg/dL — ABNORMAL HIGH (ref 70–99)

## 2013-05-09 NOTE — Telephone Encounter (Signed)
Received call from Alexandra [name] at [facility] following up on patient's appointment which had originally been scheduled for today, 11:45am, 05/09/13, but had been cancelled, due to patient being unable to come at this time, other pending medical issues.  Aware we are happy to re-schedule when patient is able to come.

## 2013-05-10 ENCOUNTER — Ambulatory Visit: Payer: Medicare Other | Admitting: Adult Health

## 2013-05-10 LAB — GLUCOSE, CAPILLARY
Glucose-Capillary: 126 mg/dL — ABNORMAL HIGH (ref 70–99)
Glucose-Capillary: 155 mg/dL — ABNORMAL HIGH (ref 70–99)
Glucose-Capillary: 136 mg/dL — ABNORMAL HIGH (ref 70–99)

## 2013-05-11 LAB — GLUCOSE, CAPILLARY
Glucose-Capillary: 111 mg/dL — ABNORMAL HIGH (ref 70–99)
Glucose-Capillary: 134 mg/dL — ABNORMAL HIGH (ref 70–99)

## 2013-05-12 LAB — GLUCOSE, CAPILLARY: Glucose-Capillary: 140 mg/dL — ABNORMAL HIGH (ref 70–99)

## 2013-05-16 LAB — GLUCOSE, CAPILLARY
Glucose-Capillary: 115 mg/dL — ABNORMAL HIGH (ref 70–99)
Glucose-Capillary: 124 mg/dL — ABNORMAL HIGH (ref 70–99)

## 2013-05-17 LAB — GLUCOSE, CAPILLARY: Glucose-Capillary: 116 mg/dL — ABNORMAL HIGH (ref 70–99)

## 2013-05-18 ENCOUNTER — Ambulatory Visit: Payer: Medicare Other | Admitting: Pulmonary Disease

## 2013-05-19 LAB — GLUCOSE, CAPILLARY: Glucose-Capillary: 144 mg/dL — ABNORMAL HIGH (ref 70–99)

## 2013-05-22 LAB — GLUCOSE, CAPILLARY: Glucose-Capillary: 106 mg/dL — ABNORMAL HIGH (ref 70–99)

## 2013-05-23 ENCOUNTER — Non-Acute Institutional Stay (SKILLED_NURSING_FACILITY): Payer: Medicare Other | Admitting: Internal Medicine

## 2013-05-23 DIAGNOSIS — J45909 Unspecified asthma, uncomplicated: Secondary | ICD-10-CM

## 2013-05-23 DIAGNOSIS — I5031 Acute diastolic (congestive) heart failure: Secondary | ICD-10-CM

## 2013-05-23 DIAGNOSIS — I509 Heart failure, unspecified: Secondary | ICD-10-CM

## 2013-05-26 DIAGNOSIS — I4891 Unspecified atrial fibrillation: Secondary | ICD-10-CM

## 2013-05-26 DIAGNOSIS — I509 Heart failure, unspecified: Secondary | ICD-10-CM

## 2013-05-26 DIAGNOSIS — I214 Non-ST elevation (NSTEMI) myocardial infarction: Secondary | ICD-10-CM

## 2013-05-26 DIAGNOSIS — E119 Type 2 diabetes mellitus without complications: Secondary | ICD-10-CM

## 2013-06-05 NOTE — Progress Notes (Signed)
Patient ID: Alexandra [name], female   DOB: 04/16/1924, 80 y.o.   MRN: [id number]           PROGRESS NOTE  DATE:  12/22.2014    FACILITY: [facility]    LEVEL OF CARE:   SNF   Acute Visit/Discharge Visit   CHIEF COMPLAINT:   Pre-discharge review.     HISTORY OF PRESENT ILLNESS:  This is a patient who came to us after a stay at [facility] from April 29, 2013 through May 04, 2013.  She is a patient who lives in seniors residence in [place].  She has her own pulse ox and called to the hospital when her pulse ox fell into the 70s.  She was admitted to the hospital with acute diastolic congestive heart failure.  She had an echocardiogram that showed a good EF.  She was diuresed with IV diuretics and had a good response.  Her oxygen was weaned to 2 L.  Her BNP was over 5,300.  She has remained on Lasix 40 once a day.  Her weight has been stable at roughly 175.5 pounds.    We took her oxygen off today.  It dropped to 85% with minimal activity.  She probably meets the criteria for oxygen.  She tells me she has been on oxygen in the past, but always seems to stabilize so they take it away.    She has asked to go home today in spite of messages from Therapy and Social Work that they do not really find this to be safe.  I have gone over this with her.  She feels she is at her baseline and wants to go back to her apartment where she has outside support from friends, etc.  She has a four-wheeled walker with  apparently a seat where she can sit down.    PHYSICAL EXAMINATION:   VITAL SIGNS:   O2 SATURATIONS:  Her O2 sat is 85% with her oxygen off for less than five minutes.    NEUROLOGICAL:    BALANCE/GAIT:  She is able to get up with some assistance, although she has a lift chair at home.   Walking with a walker, she is fairly stable.   CHEST/RESPIRATORY:  Shallow, but otherwise clear air entry bilaterally.    CARDIOVASCULAR:  CARDIAC:   Heart sounds are normal.  JVP is not elevated.    EDEMA/VARICOSITIES:  She has trivial amounts of edema in her feet.    ASSESSMENT/PLAN:  Diastolically-mediated congestive heart failure.  This seems stable.    She has both a history of asthmatic bronchitis, question COPD, and a history of sarcoidosis of the lungs from the 1990s.  She is hypoxic and meets the criteria for at least going home with oxygen.  This can be followed by her primary physician.    Atrial fibrillation.  Rate is controlled.  She is not on Coumadin.    I think the patient is stable for discharge.  She will go home with Lasix 40 mg a day.  She is on enteric-coated aspirin 81 q.d. for stroke prophylaxis.  Everything else appears to be stable.  As mentioned, she does meet the criteria for oxygen.    She has trouble getting out of a wheelchair.  However, she has a lift chair at home.  She has osteoarthritis of the knees.    Overall, there is some level of concern for this woman.  However, she is insistent on going home and is making  a rational choice.    CPT CODE: 16109 (greater than 30 minutes spent)

## 2013-06-06 ENCOUNTER — Ambulatory Visit (INDEPENDENT_AMBULATORY_CARE_PROVIDER_SITE_OTHER): Payer: Medicare Other | Admitting: Orthopedic Surgery

## 2013-06-06 ENCOUNTER — Encounter: Payer: Self-pay | Admitting: Orthopedic Surgery

## 2013-06-06 ENCOUNTER — Ambulatory Visit (INDEPENDENT_AMBULATORY_CARE_PROVIDER_SITE_OTHER): Payer: Medicare Other

## 2013-06-06 VITALS — BP 116/68 | Ht 66.0 in | Wt 177.0 lb

## 2013-06-06 DIAGNOSIS — M25572 Pain in left ankle and joints of left foot: Secondary | ICD-10-CM

## 2013-06-06 DIAGNOSIS — R600 Localized edema: Secondary | ICD-10-CM

## 2013-06-06 DIAGNOSIS — M25579 Pain in unspecified ankle and joints of unspecified foot: Secondary | ICD-10-CM

## 2013-06-06 DIAGNOSIS — M7752 Other enthesopathy of left foot: Secondary | ICD-10-CM | POA: Insufficient documentation

## 2013-06-06 DIAGNOSIS — M659 Synovitis and tenosynovitis, unspecified: Secondary | ICD-10-CM

## 2013-06-06 DIAGNOSIS — R609 Edema, unspecified: Secondary | ICD-10-CM

## 2013-06-06 DIAGNOSIS — M1990 Unspecified osteoarthritis, unspecified site: Secondary | ICD-10-CM

## 2013-06-06 NOTE — Progress Notes (Signed)
Patient ID: Alexandra SermonJudith T Woolf, female   DOB: 11-26-1923, 80 y.o.   MRN: [id number]  Chief Complaint  Patient presents with  . Foot Pain    Left foot pain, sudden onset and bilateral knee injections   80 year old female recent discharge from the hospital for congestive heart presents for bilateral knee injections and also for new onset left ankle pain and swelling which began when she was in the hospital and was worsened during her rehabilitation at the nursing home. She complains of severe swelling medial and lateral ankle pain loss of motion decreased weightbearing without trauma symptoms present for approximately 2-3 weeks. System review heart murmurs weight loss joint pain and swelling denies shortness of breath but is on oxygen  History of congestive heart failure she's had stents placed in her heart several times she's had a cholecystectomy and an appendectomy she's had a tonsillectomy  She has a family history of heart and lung disease as well as cancer and diabetes  She has a home caregiver comes in part-time  BP 116/68  Ht 5\' 6"  (1.676 m)  Wt 177 lb (80.287 kg)  BMI 28.58 kg/m2 Physical Exam(12)  1.GENERAL: normal development . Grooming and hygiene are normal she is attached on oxygen.  2. CDV: Distal leg edema unilateral left foot and ankle  3. Skin: normal except for some redness and some subcutaneous ecchymosis in various areas of the leg and arm  4. Lymph: nodes were not palpable/normal  5/6. Psychiatric: awake, alert and oriented, mood and affect normal   7. Neuro: normal sensation  8.   MSK  Gait: Supported by a walker  9.   Inspection tenderness and swelling medial and lateral aspect of the ankle especially in the perineum tendon and a 10. Range of Motion neutral dorsiflexion and 15 of plantarflexion 11. Motor weakness of the peroneal tendons 12. Stability ankle stable foot plantigrade  Imaging diffuse osteopenia without fracture  Assessment:  OA bilateral  knees Tendinitis left ankle Peripheral edema    Plan:  Knee  Injection Procedure Note  Pre-operative Diagnosis: left knee oa  Post-operative Diagnosis: same  Indications: pain  Anesthesia: ethyl chloride   Procedure Details   Verbal consent was obtained for the procedure. Time out was completed.The joint was prepped with alcohol, followed by  Ethyl chloride spray and A 20 gauge needle was inserted into the knee via lateral approach; 4ml 1% lidocaine and 1 ml of depomedrol  was then injected into the joint . The needle was removed and the area cleansed and dressed.  Complications:  None; patient tolerated the procedure well. Knee  Injection Procedure Note  Pre-operative Diagnosis: right knee oa  Post-operative Diagnosis: same  Indications: pain  Anesthesia: ethyl chloride   Procedure Details   Verbal consent was obtained for the procedure. Time out was completed.The joint was prepped with alcohol, followed by  Ethyl chloride spray and A 20 gauge needle was inserted into the knee via lateral approach; 4ml 1% lidocaine and 1 ml of depomedrol  was then injected into the joint . The needle was removed and the area cleansed and dressed.  Complications:  None; patient tolerated the procedure well.  A short Cam Walker for one month Compression stocking

## 2013-06-09 ENCOUNTER — Encounter: Payer: Self-pay | Admitting: Cardiovascular Disease

## 2013-06-09 ENCOUNTER — Ambulatory Visit (INDEPENDENT_AMBULATORY_CARE_PROVIDER_SITE_OTHER): Payer: Medicare Other | Admitting: Cardiovascular Disease

## 2013-06-09 VITALS — BP 147/98 | HR 85 | Ht 66.0 in | Wt 180.0 lb

## 2013-06-09 DIAGNOSIS — I10 Essential (primary) hypertension: Secondary | ICD-10-CM

## 2013-06-09 DIAGNOSIS — I7090 Unspecified atherosclerosis: Secondary | ICD-10-CM

## 2013-06-09 DIAGNOSIS — N183 Chronic kidney disease, stage 3 unspecified: Secondary | ICD-10-CM

## 2013-06-09 DIAGNOSIS — I5032 Chronic diastolic (congestive) heart failure: Secondary | ICD-10-CM

## 2013-06-09 DIAGNOSIS — E785 Hyperlipidemia, unspecified: Secondary | ICD-10-CM

## 2013-06-09 DIAGNOSIS — I2510 Atherosclerotic heart disease of native coronary artery without angina pectoris: Secondary | ICD-10-CM

## 2013-06-09 DIAGNOSIS — I4891 Unspecified atrial fibrillation: Secondary | ICD-10-CM

## 2013-06-09 MED ORDER — METOPROLOL TARTRATE 50 MG PO TABS
ORAL_TABLET | ORAL | Status: DC
Start: 2013-06-09 — End: 2013-08-14

## 2013-06-09 MED ORDER — ISOSORBIDE DINITRATE 10 MG PO TABS: 10.0000 mg | ORAL_TABLET | Freq: Three times a day (TID) | ORAL | Status: DC

## 2013-06-09 NOTE — Patient Instructions (Addendum)
Your physician recommends that you schedule a follow-up appointment in: 3 months   Your physician has recommended you make the following change in your medication:    START   Isordil 10 mg three times a day    INCREASE   Metoprolol to 50 mg twice a day

## 2013-06-09 NOTE — Progress Notes (Signed)
Patient ID: Alexandra [name], female   DOB: 1923-11-26, 80 y.o.   MRN: [id number]      SUBJECTIVE: Alexandra [name] is an 80 year old woman with CAD, status post hospitalization in March of 2014 with a non-ST elevation MI. She was found to have severe stenosis in the proximal LAD stent and complex CAD with moderately severe disease of the RCA and circumflex. She was offered CABG but refused. Per Dr. Clifton JamesMcAlhany, attempted PCI of the RCA or circumflex should only be done if she had recurrent symptoms. He felt that PCI would be very difficult given the degree of calcium and tortuosity. She was continued on DAPT (Plavix nonresponder, on Effient). She was not placed on Coumadin or Xarelto in the setting of dual antiplatelet therapy for atrial fibrillation.  Echocardiography on 05-01-2013 revealed hyperdynamic LV systolic function, EF 65-70%, with a mild intracavitary gradient, mild aortic stenosis, mild to moderate mitral stenosis, and severe pulmonary hypertension. She was hospitalized in 05/2013 for acute diastolic heart failure. She recently fractured her left foot as well.  She denies chest pain and shortness of breath. She uses 2 L of oxygen around-the-clock. She is distressed about her left foot fracture and the physical therapy associated with it.       Allergies  Allergen Reactions  . Codeine Other (See Comments)    Reaction: severe GI pain  . Diazepam Other (See Comments)    REACTION: causes pt unable to wake up after taking  . Lipitor [Atorvastatin Calcium] Other (See Comments)    Reaction: causes vertigo  . Penicillins Other (See Comments)    Reaction: unknown (childhood )  . Plavix [Clopidogrel Bisulfate]     Nonresponder  . Sulfonamide Derivatives Other (See Comments)    Reaction: unknown (childhood)  . Mango Flavor Rash    Current Outpatient Prescriptions  Medication Sig Dispense Refill  . acetaminophen (TYLENOL) 500 MG tablet Take 1 tablet (500 mg total) by mouth every 6 (six) hours  as needed for pain.      [name]. amitriptyline (ELAVIL) 10 MG tablet TAKE (1) TABLET BY MOUTH ONCE DAILY.  30 tablet  11  . ascorbic acid (VITAMIN C) 500 MG tablet Take 500 mg by mouth 2 (two) times daily. Twice daily with iron tablet      . aspirin EC 81 MG tablet Take 81 mg by mouth every morning.       . cholecalciferol (VITAMIN D) 1000 UNITS tablet Take 1,000 Units by mouth daily.      [name]. econazole nitrate 1 % cream Apply 1 application topically daily as needed. FOR FEET IRRITATION      . EFFIENT 5 MG TABS tablet TAKE 1 TABLET ONCE DAILY.  30 tablet  3  . ferrous sulfate 324 (65 FE) MG TBEC Take 1 tablet by mouth 2 (two) times daily.       . furosemide (LASIX) 40 MG tablet Take 40 mg by mouth daily.       [name]. glimepiride (AMARYL) 1 MG tablet Take 1/2 tablet by mouth daily  15 tablet  6  . glucose blood (ONE TOUCH TEST STRIPS) test strip Test once daily. DX;  250.00  50 each  6  . hydrOXYzine (ATARAX/VISTARIL) 25 MG tablet TAKE 1 TABLET EVERY 6 HOURS AS NEEDED FOR ITCHING OR ANXIETY.  30 tablet  5  . levothyroxine (SYNTHROID, LEVOTHROID) 50 MCG tablet Take 1 tablet (50 mcg total) by mouth daily before breakfast.  30 tablet  11  . meclizine (ANTIVERT) 25 MG  tablet Take 1 tablet (25 mg total) by mouth 3 (three) times daily as needed for dizziness.  50 tablet  6  . metoprolol (LOPRESSOR) 50 MG tablet Take 50 mg in the AM and 25 mg (1/2) tablet in the PM  45 tablet  6  . nitroGLYCERIN (NITROSTAT) 0.4 MG SL tablet Place 1 tablet (0.4 mg total) under the tongue every 5 (five) minutes as needed for chest pain (up to 3 doses).  25 tablet  4  . ONE TOUCH LANCETS MISC Test once daily. Dx:  250.00  200 each  6  . pantoprazole (PROTONIX) 40 MG tablet TAKE (1) TABLET BY MOUTH ONCE DAILY.      . rosuvastatin (CRESTOR) 10 MG tablet Take 1 tablet (10 mg total) by mouth daily.  30 tablet  6  . vitamin B-12 (CYANOCOBALAMIN) 1000 MCG tablet Take 1,000 mcg by mouth every morning.       No current facility-administered  medications for this visit.    Past Medical History  Diagnosis Date  . Asthmatic bronchitis   . Sarcoidosis     a. 1990s of lung tx with prednisone temporarily.  [name] HTN (hypertension)   . PVD (peripheral vascular disease)     a. 06/2012 Carotid U/S: < 50% bilat ICA stenosis.  . Hyperlipemia   . DM (diabetes mellitus)   . Obesity   . GERD (gastroesophageal reflux disease)   . Diverticulosis of colon   . IBS (irritable bowel syndrome)   . DJD (degenerative joint disease)     Hip and knee pain; hip bursitis; patellar subluxation  . Vitamin D deficiency disease   . Fibromyalgia   . Anxiety     a. Guaiac neg 03/2012.  . Intermittent vertigo   . Anemia   . Venous insufficiency   . PSVT (paroxysmal supraventricular tachycardia)     a. Prior hx of palpitations - developed SVT during stress Myoview 09/2011. b. Noted on tele 03/2012.  . Valvular heart disease     a. moderately thickened/mildly calcified aortic valve, mod MR 03/20/12  . CKD (chronic kidney disease), stage II   . PAC (premature atrial contraction)   . CAD (coronary artery disease) 03/2011    a. NSTEMI 03/2011 complicated by pulm edema s/p DES to subtotal LAD. b. [place] s/p PTCA/cutting balloon to prox LAD for ISR 12/01/11 (med rx for RCA/Cx dz).  c. NSTEMI 03/2012 s/p cutting angioplasty of ISR of prox LAD 03/21/12 (PLAVIX NONRESPONDER). d. DES to LAD for ISR 07/2012 (pt refused consideration of CABG).  [name] A-fib     a. Noted 07/2012 - not on anticoag due for need for DAPT and age.  . Thrombocytopenia     a. Noted 07/2012.  . On home oxygen therapy     2L/min with ambulation as of 07/2012.    Past Surgical History  Procedure Laterality Date  . Cholecystectomy    . Tonsillectomy    . Hernia repair    . Cataract extraction    . Coronary angioplasty with stent placement  03/21/2012    History   Social History  . Marital Status: Widowed    Spouse Name: N/A    Number of Children: 1  . Years of Education: N/A    Occupational History  . Not on file.   Social History Main Topics  . Smoking status: Never Smoker   . Smokeless tobacco: Never Used  . Alcohol Use: No     Comment: social  . Drug Use: No  .  Sexual Activity: No   Other Topics Concern  . Not on file   Social History Narrative   Pt is only child. Husband Duanne Guess passed away in 10/31/02   Pt has 1 son in [place](his wife is MD doing [employer] Research )      Filed Vitals:   06/09/13 1321  BP: 147/98  Pulse: 85  Height: 5\' 6"  (1.676 m)  Weight: 180 lb (81.647 kg)    PHYSICAL EXAM General: NAD  Neck: No JVD, no thyromegaly or thyroid nodule.  Lungs: Clear to auscultation bilaterally with normal respiratory effort.  CV: Nondisplaced PMI. Heart regular S1/S2, no S3/S4, II/VI holosystolic murmur. 1+ left leg pretibial edema. No carotid bruit. Normal pedal pulses.  Abdomen: Soft, nontender, no hepatosplenomegaly, no distention.  Neurologic: Alert and oriented x 3.  Psych: Normal affect.  Extremities: No clubbing or cyanosis.    ECG: reviewed and available in electronic records.  Echo (05-01-2013): - Study data: Technically difficult study. - Left ventricle: The cavity size was mildly reduced. Wall thickness was increased in a pattern of severe LVH. Systolic function was vigorous. The estimated ejection fraction was in the range of 65% to 70%. There is a mild intracavitary gradient created by cavity obliteration. Wall motion was normal; there were no regional wall motion abnormalities. Diastolic function is indeterminate. There is evidence of elevated left atrial pressure (E/e' 12) - Aortic valve: Moderately calcified annulus. Trileaflet; moderately thickened leaflets. There was mild stenosis. - Mitral valve: Moderately to severely calcified annulus. Moderately thickened leaflets . The findings are consistent with mild to moderate stenosis. - Left atrium: The atrium was severely dilated. - Right ventricle: The cavity size was mildly  dilated. Wall thickness was increased. - Right atrium: The atrium was moderately to severely dilated. - Pulmonary arteries: Systolic pressure was severely increased. PA peak pressure: 75mm Hg (S).     ASSESSMENT AND PLAN: 1. CAD: remains on ASA, Crestor, Effient, and metoprolol. Symptomatically stable.  2. Atrial fibrillation: rate is controlled. No anticoagulation due to being on dual antiplatelet therapy.  3. Hyperlipidemia: on Crestor.  4. HTN: reasonably controlled on current therapy. However, given her propensity for diastolic decompensation, I will increase metoprolol to 50 mg bid. 5. Chronic diastolic heart failure: she is on Lasix 40 mg daily. I will increase metoprolol and add isosorbide dinitrate 10 mg tid.  Dispo: f/u 3 months.  Prentice Docker, M.D., F.A.C.C.

## 2013-06-12 ENCOUNTER — Telehealth: Payer: Self-pay | Admitting: Cardiovascular Disease

## 2013-06-12 NOTE — Telephone Encounter (Signed)
Patient would like to speak with nurse regarding medication she was given at [facility] on Friday / tgs

## 2013-06-13 ENCOUNTER — Telehealth: Payer: Self-pay

## 2013-06-13 NOTE — Telephone Encounter (Signed)
D/c Isordil.

## 2013-06-13 NOTE — Telephone Encounter (Signed)
Pt was put on isordil, she states that it is causing her to "trip" nervous can walk stumbles all over the place last for about 5 hrs and feels like it is going to kill her. I told her not to take any more until she hears back from us.

## 2013-06-13 NOTE — Telephone Encounter (Signed)
Isordil d/c'd pt notified

## 2013-06-13 NOTE — Telephone Encounter (Signed)
Pt states her isordil makes her anxious and nervous,relieved with "nerve" pill.reports blood pressure at home are normal to high,no reported low readings.  Will forward to cardiologist

## 2013-06-14 ENCOUNTER — Ambulatory Visit: Payer: Medicare Other | Admitting: Adult Health

## 2013-06-19 ENCOUNTER — Ambulatory Visit: Payer: Medicare Other | Admitting: Pulmonary Disease

## 2013-06-26 ENCOUNTER — Ambulatory Visit: Payer: Medicare Other | Admitting: Cardiovascular Disease

## 2013-07-06 IMAGING — US US CAROTID DUPLEX BILAT
1 series · 13 of 24 positions shown · non-contrast
Comparison: 09/07/2011

CLINICAL DATA: Bilateral carotid plaque

BILATERAL CAROTID DUPLEX ULTRASOUND
TECHNIQUE: Gray scale imaging, color Doppler and duplex ultrasound
was performed of bilateral carotid and vertebral arteries in the
neck.

[Series 1: us carotid duplex bilat · 0.09mm/px · 13 of 70 slices shown]
[im 1/70]
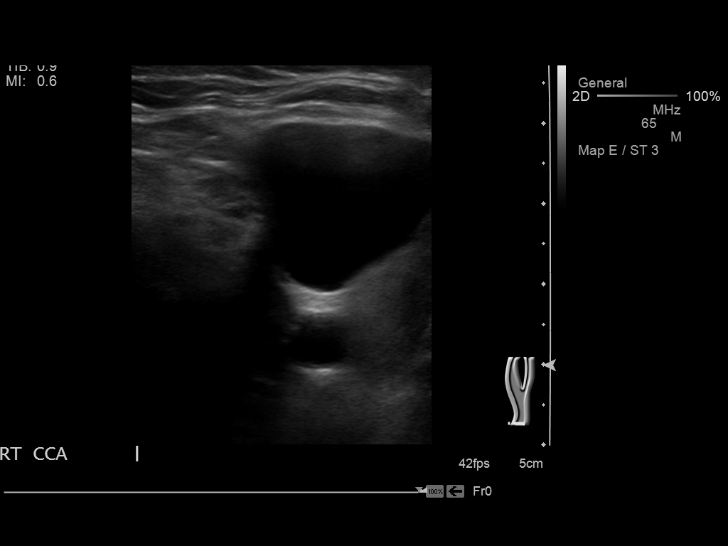
[im 7/70]
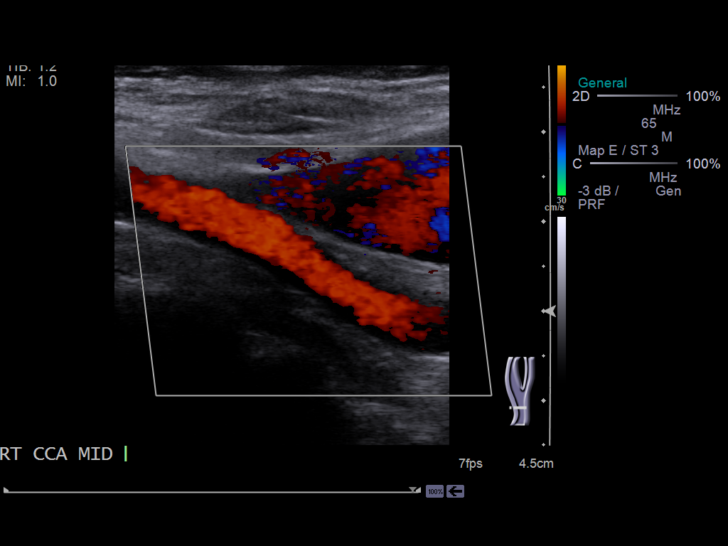
[im 13/70]
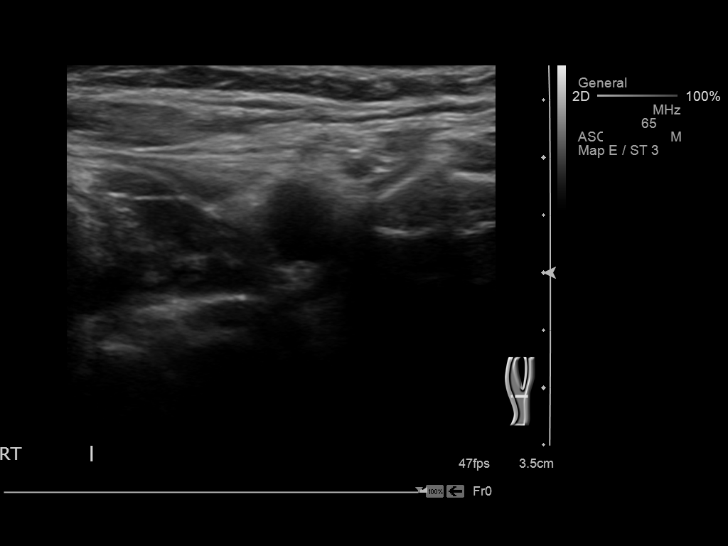
[im 19/70]
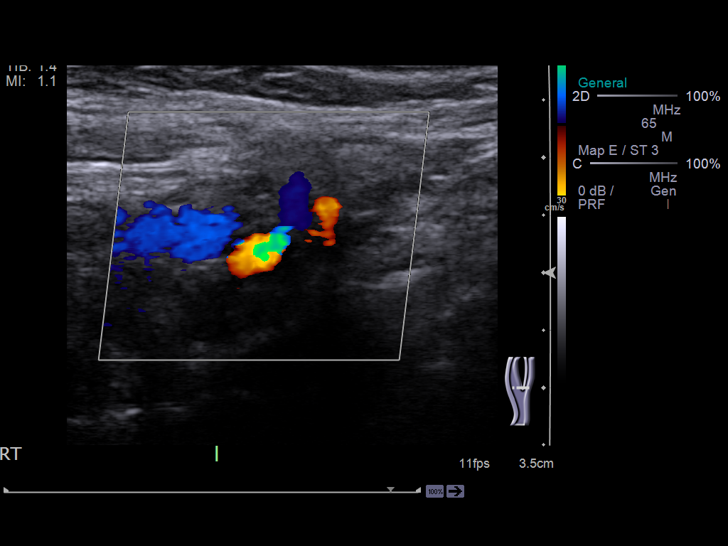
[im 25/70]
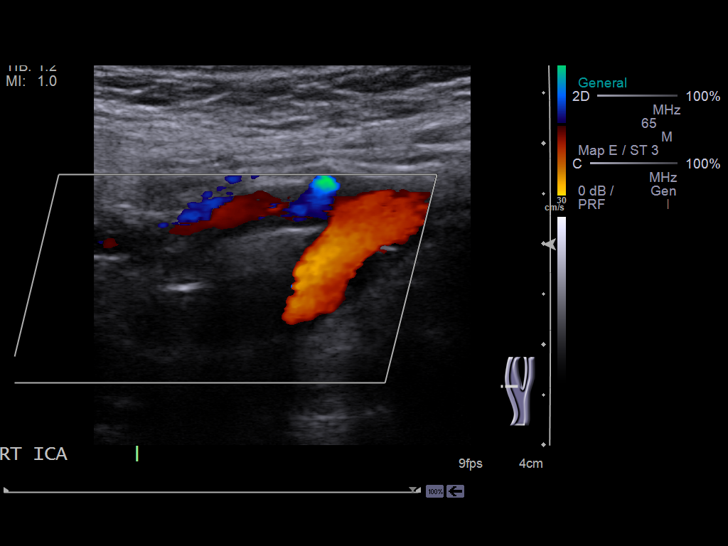
[im 31/70]
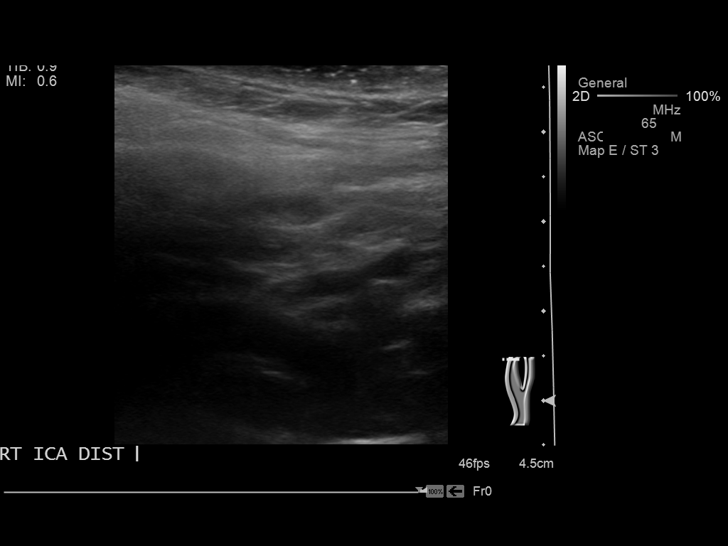
[im 37/70]
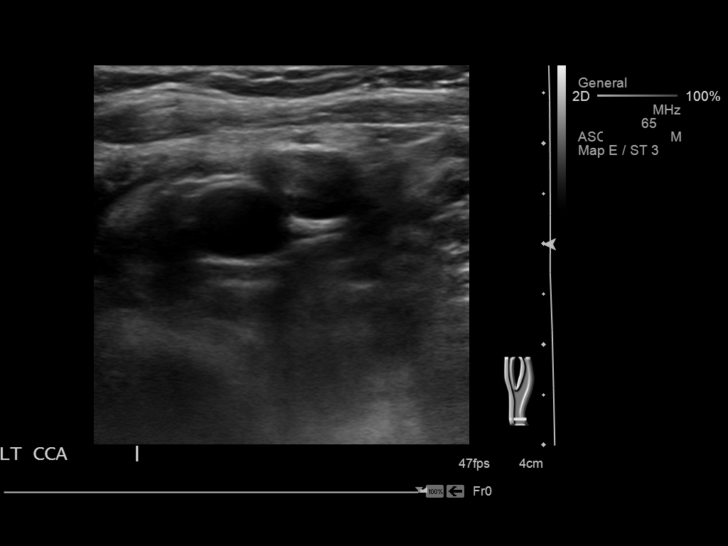
[im 40/70]
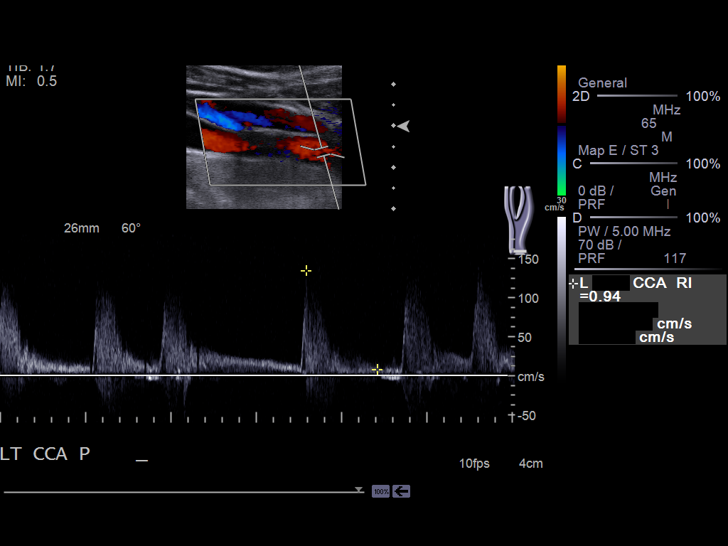
[im 46/70]
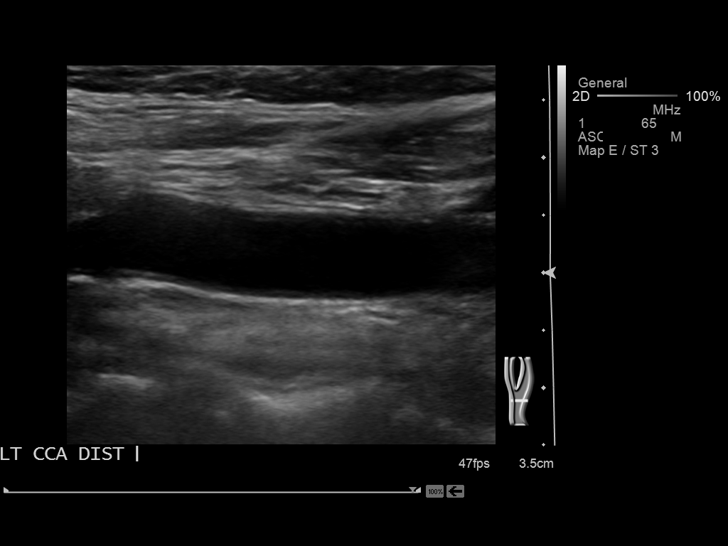
[im 52/70]
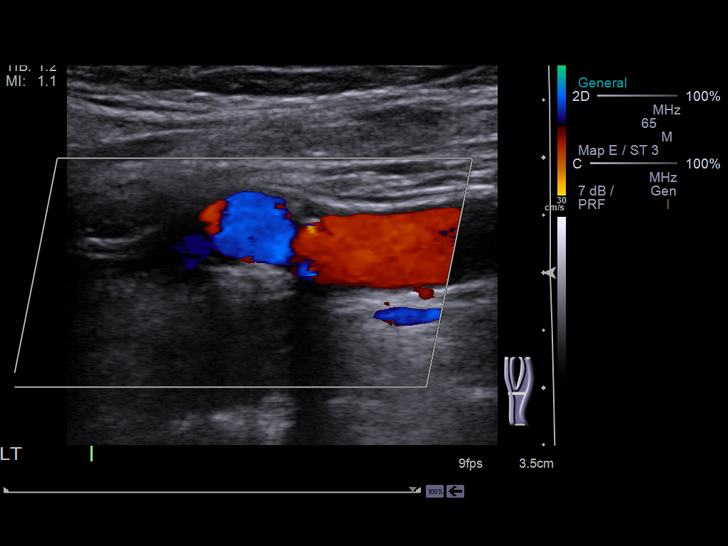
[im 58/70]
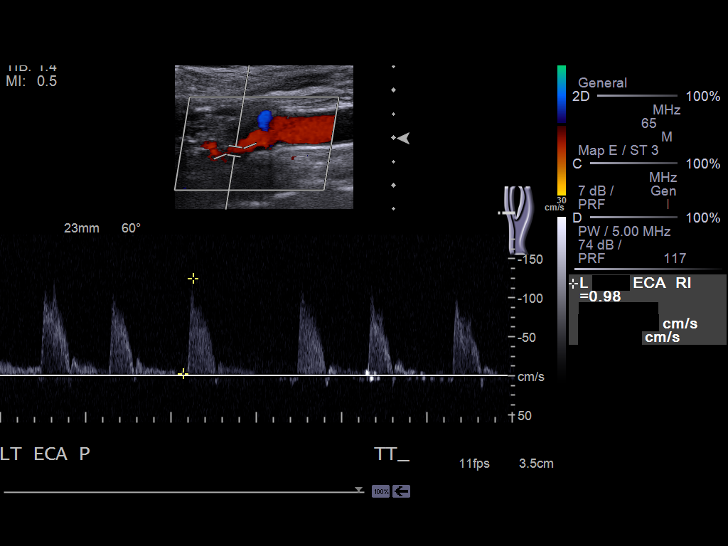
[im 64/70]
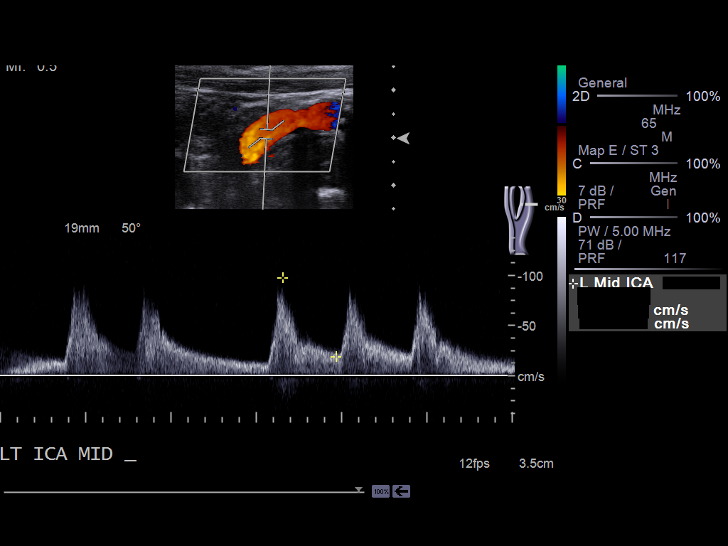
[im 70/70]
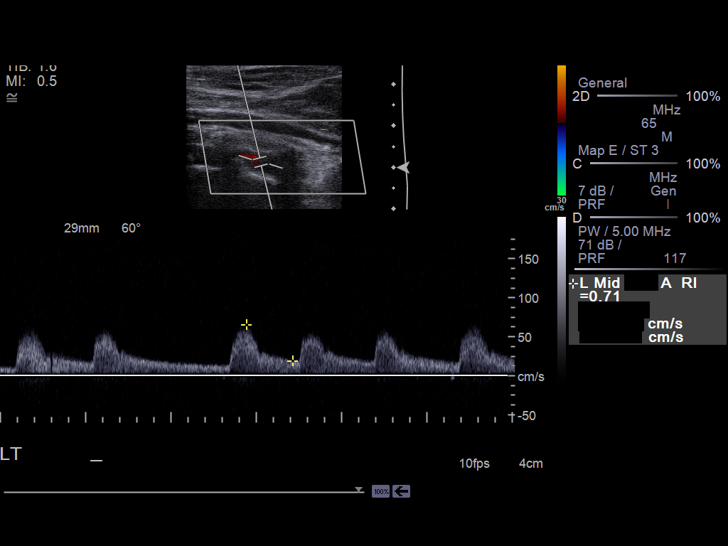

[13 of 24 positions shown; findings below may reference images not displayed]

Criteria:  Quantification of carotid stenosis is based on velocity
parameters that correlate the residual internal carotid diameter
with NASCET-based stenosis levels, using the diameter of the distal
internal carotid lumen as the denominator for stenosis measurement.

The following velocity measurements were obtained:

                 PEAK SYSTOLIC/END DIASTOLIC
RIGHT
ICA:                        114/21cm/sec
CCA:                        128/16cm/sec
SYSTOLIC ICA/CCA RATIO:
DIASTOLIC ICA/CCA RATIO:
ECA:                        134cm/sec

LEFT
ICA:                        139/26cm/sec
CCA:                        100/23cm/sec
SYSTOLIC ICA/CCA RATIO:
DIASTOLIC ICA/CCA RATIO:
ECA:                        125cm/sec
FINDINGS: RIGHT CAROTID ARTERY: There is eccentric partially calcified plaque
in the carotid bulb extending into proximal internal and external
carotid arteries without high-grade stenosis.  A nonspecific
cardiac arrhythmia is noted.  Normal color Doppler signal.

RIGHT VERTEBRAL ARTERY:  Normal flow direction and waveform.

LEFT CAROTID ARTERY: Mild eccentric partially calcified plaque in
the carotid bulb and proximal ICA without high-grade stenosis.  No
focal aliasing on color Doppler interrogation.  Distal ICA is
mildly tortuous.

LEFT VERTEBRAL ARTERY:  Normal flow direction and waveform.
IMPRESSION: 1.  Bilateral carotid bifurcation and proximal ICA plaque,
resulting in less than 50% diameter stenosis. The exam does not
exclude plaque ulceration or embolization.  Continued surveillance
recommended.

## 2013-07-11 ENCOUNTER — Encounter: Payer: Self-pay | Admitting: Orthopedic Surgery

## 2013-07-11 ENCOUNTER — Ambulatory Visit (INDEPENDENT_AMBULATORY_CARE_PROVIDER_SITE_OTHER): Payer: Medicare Other | Admitting: Orthopedic Surgery

## 2013-07-11 VITALS — BP 106/61 | Ht 66.0 in | Wt 177.0 lb

## 2013-07-11 DIAGNOSIS — M659 Synovitis and tenosynovitis, unspecified: Secondary | ICD-10-CM

## 2013-07-11 DIAGNOSIS — M25579 Pain in unspecified ankle and joints of unspecified foot: Secondary | ICD-10-CM

## 2013-07-11 DIAGNOSIS — M7752 Other enthesopathy of left foot: Secondary | ICD-10-CM

## 2013-07-11 DIAGNOSIS — M25572 Pain in left ankle and joints of left foot: Secondary | ICD-10-CM

## 2013-07-11 NOTE — Progress Notes (Signed)
Patient ID: Traci SermonJudith T [name], female   DOB: 07/04/1923, 80 y.o.   MRN: [id number] Chief Complaint  Patient presents with  . Follow-up    one month recheck left ankle    BP 106/61  Ht 5\' 6"  (1.676 m)  Wt 177 lb (80.287 kg)  BMI 28.58 kg/m2  One month recheck left ankle unknown cause of swelling most likely tendinitis/inflammation. Cam Walker placed did well she did get a blister over the dorsum of the second toe secondary to a hammertoe deformity. But her pain and swelling of decreased her foot looks good her skin is wrinkling there are no ulcerations  Normal activities followup as needed for the foot

## 2013-07-20 ENCOUNTER — Telehealth: Payer: Self-pay | Admitting: Pulmonary Disease

## 2013-07-20 DIAGNOSIS — E663 Overweight: Secondary | ICD-10-CM

## 2013-07-20 NOTE — Telephone Encounter (Signed)
Per SN---  This may be related to her insurance coverage.  [facility]  thanks

## 2013-07-20 NOTE — Telephone Encounter (Signed)
Order sent to [facility]  Pt aware and states nothing further needed   

## 2013-07-20 NOTE — Telephone Encounter (Signed)
Spoke with spouse. She reports [facility] She wants this to continue to be done and requests we re send this order so nurse can still check on her weekly. She reports she is not able to get out of even drive/get in a car. Please advise Dr. Kriste BasqueNadel thanks

## 2013-07-20 NOTE — Telephone Encounter (Signed)
Pt is also requesting POC. Please advise SN thanks

## 2013-07-21 ENCOUNTER — Telehealth: Payer: Self-pay | Admitting: Pulmonary Disease

## 2013-07-21 NOTE — Telephone Encounter (Signed)
I called and spoke w/ pt. Made her aware. Nothing further needed

## 2013-07-25 ENCOUNTER — Other Ambulatory Visit: Payer: Self-pay | Admitting: Pulmonary Disease

## 2013-08-03 ENCOUNTER — Other Ambulatory Visit: Payer: Self-pay | Admitting: Pulmonary Disease

## 2013-08-14 ENCOUNTER — Other Ambulatory Visit (INDEPENDENT_AMBULATORY_CARE_PROVIDER_SITE_OTHER): Payer: Medicare Other

## 2013-08-14 ENCOUNTER — Encounter: Payer: Self-pay | Admitting: Adult Health

## 2013-08-14 ENCOUNTER — Ambulatory Visit (INDEPENDENT_AMBULATORY_CARE_PROVIDER_SITE_OTHER): Payer: Medicare Other | Admitting: Adult Health

## 2013-08-14 VITALS — BP 108/68 | HR 61 | Temp 98.6°F | Ht 66.0 in | Wt 183.2 lb

## 2013-08-14 DIAGNOSIS — F419 Anxiety disorder, unspecified: Secondary | ICD-10-CM

## 2013-08-14 DIAGNOSIS — N183 Chronic kidney disease, stage 3 unspecified: Secondary | ICD-10-CM

## 2013-08-14 DIAGNOSIS — D869 Sarcoidosis, unspecified: Secondary | ICD-10-CM

## 2013-08-14 DIAGNOSIS — I7090 Unspecified atherosclerosis: Secondary | ICD-10-CM

## 2013-08-14 DIAGNOSIS — E119 Type 2 diabetes mellitus without complications: Secondary | ICD-10-CM

## 2013-08-14 DIAGNOSIS — I2510 Atherosclerotic heart disease of native coronary artery without angina pectoris: Secondary | ICD-10-CM

## 2013-08-14 DIAGNOSIS — D649 Anemia, unspecified: Secondary | ICD-10-CM

## 2013-08-14 DIAGNOSIS — F411 Generalized anxiety disorder: Secondary | ICD-10-CM

## 2013-08-14 DIAGNOSIS — I509 Heart failure, unspecified: Secondary | ICD-10-CM

## 2013-08-14 DIAGNOSIS — I10 Essential (primary) hypertension: Secondary | ICD-10-CM

## 2013-08-14 LAB — CBC WITH DIFFERENTIAL/PLATELET
BASOS ABS: 0 10*3/uL (ref 0.0–0.1)
BASOS PCT: 0.3 % (ref 0.0–3.0)
EOS PCT: 3.4 % (ref 0.0–5.0)
Eosinophils Absolute: 0.3 10*3/uL (ref 0.0–0.7)
HEMATOCRIT: 35.8 % — AB (ref 36.0–46.0)
Hemoglobin: 11.9 g/dL — ABNORMAL LOW (ref 12.0–15.0)
LYMPHS ABS: 1 10*3/uL (ref 0.7–4.0)
LYMPHS PCT: 11.8 % — AB (ref 12.0–46.0)
MCHC: 33.1 g/dL (ref 30.0–36.0)
MCV: 89.3 fl (ref 78.0–100.0)
Monocytes Absolute: 0.3 10*3/uL (ref 0.1–1.0)
Monocytes Relative: 4 % (ref 3.0–12.0)
NEUTROS ABS: 6.6 10*3/uL (ref 1.4–7.7)
Neutrophils Relative %: 80.5 % — ABNORMAL HIGH (ref 43.0–77.0)
Platelets: 158 10*3/uL (ref 150.0–400.0)
RBC: 4 Mil/uL (ref 3.87–5.11)
RDW: 15.8 % — ABNORMAL HIGH (ref 11.5–14.6)
WBC: 8.3 10*3/uL (ref 4.5–10.5)

## 2013-08-14 LAB — BASIC METABOLIC PANEL
BUN: 28 mg/dL — ABNORMAL HIGH (ref 6–23)
CALCIUM: 9.4 mg/dL (ref 8.4–10.5)
CHLORIDE: 99 meq/L (ref 96–112)
CO2: 34 meq/L — AB (ref 19–32)
CREATININE: 1.2 mg/dL (ref 0.4–1.2)
GFR: 43.84 mL/min — ABNORMAL LOW (ref >60.00)
GLUCOSE: 109 mg/dL — AB (ref 70–99)
Potassium: 4 mEq/L (ref 3.5–5.1)
Sodium: 141 mEq/L (ref 135–145)

## 2013-08-14 LAB — TSH: TSH: 1.82 u[IU]/mL (ref 0.35–5.50)

## 2013-08-14 LAB — HEMOGLOBIN A1C: HEMOGLOBIN A1C: 6.6 % — AB (ref 4.6–6.5)

## 2013-08-14 NOTE — Assessment & Plan Note (Signed)
Diet discussed  Check labs

## 2013-08-14 NOTE — Assessment & Plan Note (Signed)
Labs today ,  Avoid nsaids

## 2013-08-14 NOTE — Addendum Note (Signed)
Addended by: Boone MasterJONES, Danyah Guastella E on: 08/14/2013 03:38 PM   Modules accepted: Orders

## 2013-08-14 NOTE — Patient Instructions (Addendum)
Continue on current regimen  Low sweet, cholesterol diet.  Will send order to DME for portable tank.  Labs today .  Dr. Kriste BasqueNadel is retiring from primary care and we will need to set you up with a new Primary Care provider.  Please let us know if you would like us to assist with this referral.   Follow up Dr. Kriste BasqueNadel  In 6 months for lung issues

## 2013-08-14 NOTE — Assessment & Plan Note (Signed)
Recent admission 04/2013  Now on O2 at 2 l  Portable tank order sent to DME

## 2013-08-14 NOTE — Assessment & Plan Note (Signed)
Labs today

## 2013-08-14 NOTE — Assessment & Plan Note (Signed)
Controlled on rx   

## 2013-08-14 NOTE — Progress Notes (Signed)
Subjective:    Patient ID: Alexandra [name], female    DOB: January 09, 1924, 80 y.o.   MRN: [id number]  HPI 80 y/o WF  he has multiple medical problems including AB, hx inactive Sarcoid, HBP, PVD, VI, Hyperlipidemia, DM, Obesity, GERD/ Divertics/ IBS, DJD/ FM/ osteop, Anxiety, Anemia...   08/14/2013 Follow up  Returns for 6 mon follow up for DM , thyroid , AB and HTN  Was admitted 04/29/13 for Acute Diastolic CHF , discharged on 05/04/13 on O2 at 2l/m  Needs portable tank.  Pt states her breathing does well as long as shes on O2.    Denies SOB, cough and CP.   BS at home 100-120. Tries to eat well.  No issues with meds.Sherlene Shams/refills.  No labs since discharge.  Patient denies any hemoptysis, orthopnea, PND, increased leg swelling, or fever        Problem List:  Hx of ASTHMATIC BRONCHITIS, ACUTE (ICD-466.0) - uses Mucinex as needed... she has chr stable DOE w/o change... she say she walks "some" but is obviously too sedentary due to her arthritis... she has PNEUMOVAX several yrs ago.  Hx of SARCOIDOSIS (ICD-135) - initially diagnosed in 1992 w/ CXR showing diffuse ILDis, +Gallium scan, ACE=61, & Bronch w/ bx showing non-caseating granulomas... treated w/ Pred & weaned off w/ no active disease since then... last CTChest 7/04 w/ ca++ Ao & coronaries, +mediastinal fat, centilobular emphysema, some scarring... stable without new symptoms of cough, sputum, CP or dyspnea... ~  CXR 1/09 showed stable chr lung changes & prom right hilum... ~  CXR 2/11 showed diffuse ILDz, no adenopathy, NAD.[name].. ~  CXR 2/12 showed borderline cardiomeg, tort Ao, sl incr markings as before, NAD.[name].. ~  CXR 10/12 in hosp showed cardiomeg, calcif in Ao, vasc congestion w/ pulm edema & sm effusions... ~  CXR 7/13 in hosp showed cardiomegaly & mild vasc congestion... ~  CXR 10/13 showed stable heart size, diffuse mitral annular calcif, clear lungs, DJD in TSpine  HYPERTENSION (ICD-401.9) - controlled on meds: METOPROLOL 50mg - 1.5  tabsBid, AMLODIPINE 2.5mg /d, AVAPRO 150mg /d, LASIX 40mg /d... ~  Myoview 12/05 was neg- no ischemia, no infarct... ~  EKG 2/11 showed NSR, rate 82/min, WNL.[name].. ~  4/12:  DrHochrein increased her Metoprolol from 25Bid to 50Bid for her BP & ectopy... ~  6/12:  BP= 136/74 & denies HA, visual changes, CP, palipit, syncope, change in dyspnea, edema, etc... ~  3/13:  BP= 124/70 & she is feeling well, denies CP/ SOB/ ch in edema/ etc... ~  7/13:  BP= 124/60 & these meds were not changed during her recent hosp for angina w/ cath & PCI for LAD in-stent restenosis... ~  8/13:  BP= 130/60 & she denies CP, palpit, ch in SOB or edema... ~  11/13:  BP= 124/56 on same 4med regimen & she denies CP, palpit, SOB, edema... ~  2/14: on Metop50-1.5tabsBid, Amlod2.5, Avapro150, Lasix40; BP=118/72 & she is feeling ok- denies current CP, palpit, ch in SOB, edema, etc. ~  4/14: on Metop50-decr to 1AM & 1/2PM, Lasix40, & off Amlod & Avapro; BP=120/72 & she is feeling ok- denies current CP, palpit, ch in SOB, edema, etc.  VALVULAR HEART DISEASE>  PALPITATIONS & Ventric Ectopy>  Eval & Rx by DrHochrein, his notes are reviewed... On METOPROLOL 75mg Bid & she uses HYDROXYZINE (Vistaril) 25mg  Prn which she says is helpful... ~  Event Monitor done 4/12 and no definite AFib encountered, just PVCs...  ~  EKG shows just some PACs, otherw neg... ~  2DEcho 10/13 showed mild LVH, norm LVF w/ EF=65-70%; AoV w/ mildly thickened & calcif leaflets; heavy mitral annular calcif, mod thickened leaflets, & modMR...  CORONARY ARTERY DISEASE >> on ASA 81mg /d & EFFIENT5mg  (off Plavix now); plus the meds above & followed by DrRothbart... ~  She had nonQ-MI 10/12 & cath showed severe 3 vessel CAD w/ PTCA/ stent placed in LAD (99% occluded) + med rx w/ Plavix added; there was AK anteriorly but LVF was preserved... ~  EKG 11/12 showed NSR, rate85, PACs, septal infarct, otherw neg... ~  DrRothbart did a Myoview scan 3/13 w/ SVT developing after the  infusion w/ CP & abn STsegment changes; she spont reverted to NSR w/o need for intervention; Myoview images showed scar anteroseptal area + soft tissue attenuation & minor ischemia at the apex & EF=77%; They incr her Metoprolol to 75mg Bid... ~  7/13:  She was [facility] for unstable angina w/ cath showing 99% prox LAD in-stent restenosis- PCI w/ cutting ballon & improved; she had mod dis in RCA & Circ (including worsening 90% distal RCA stenosis- but intervention was r/o)... EKG showed NSR, PACs, rate66, otherw wnl, NAD... ~  10/13:  She was [facility] w/ NSTEMI & cath showed in-stent restenosis LAD- s/p cutting balloon angioplasty; she also had resid dis in RCA & CIRC- continue med rx;  They also noted HBP w/ hypotension this adm, asymptomatic SVT on tele, valv heart dis w/ thickened/ calcified AoV & modMR, mod carotid stenoses, mild renal insuffic... Followed by DrRothbart & cardiology notes reviewed & Med Rec performed... ~  2/14: on ASA81, Effient5, & off Plavix; Hosp 10/13 as above, now improved; she saw [facility] 11/13- stable, continue same meds... ~  4/14: on ASA81, Effient5 & off Plavix; Hosp 10/13 & 3/14 w/ NSTEMI & PTCA/ DES to LAD- she has refused CABG; also had AFib & they decided on rate control; she had f/u [facility] 4/14- continue duel antiplat Rx w/ ASA & Effient, meds adjusted.  PERIPHERAL VASCULAR DISEASE (ICD-443.9) - on ASA 81, EFFIENT5... doing well w/o claud symptoms or cerebral ischemic symptoms... ~  Art Dopplers of LE's 6/06 showed biphasic waveforms in ant tib & peroneal arts, normal ABI's... ~  CDopplers 5/12 showed mild heterogeneous plaque bilat w/ 40-59% bilat ICA stenoses... ~  CDopplers 4/13 showed mild to mod plaque bilat, 50-69% bilat ICA stenoses (lower end but R>L)...  VENOUS INSUFFICIENCY (ICD-459.81) - on low sodium diet and the Lasix... she knows to elevate legs, wear support hose, etc... ~  7/13:  She has persistent bilat LE edema & weight is similar 192# on LASIX 40mg /d    HYPERLIPIDEMIA (ICD-272.4) - on SIMVASTATIN 40mg /d... ~  FLP 1/08 showed TChol 121, TG 120, HDL 46, LDL 51... looks great- continue Simva40. ~  FLP 1/09 showed TChol 137, TG 165, HDL 37, LDL 67 ~  FLP 9/09 showed TChol 133, TG 141, HDL 47, LDL 57 ~  FLP 5/10 showed TChol 131, TG 190, HDL 46, LDL 47 ~  FLP 2/11 showed TChol 129, TG 129, HDL 52, LDL 51 ~  FLP 2/12 showed TChol 134, TG 159, HDL 49, LDL 53 ~  FLP 3/13 on Simva40 showed TChol 112, TG 157, HDL 46, LDL 35... Continue same + better low fat diet. ~  FLP 10/13 on Cres10 showed TChol 116, TG 97, HDL 46, LDL 51... Changed to Cres10 during 10/13 adm...  DM (ICD-250.00) - on diet + GLIMEPIRIDE => see below... ~  labs 1/08 showed BS=130 and HgA1c=6.1 ~  labs 1/09 showed BS= 137, HgA1c= 6.2... diet and exercise are the keys!!! ~  labs 9/09 showed BS= 119, HgA1c= 6.3 ~  labs 5/10 showed BS= 134, A1c= 6.3 ~  labs 2/11 showed BS= 122, A1c= 6.3 ~  labs 6/11 showed BS= 106 ~  Labs 2/12 showed BS= 138, A1c= 6.3 ~  Labs 3/13 on Metform500Bid showed BS= 126, A1c= 6.0.Marland KitchenMarland Kitchen Continue same, get wt down! ~  7/13:  Metformin was held 7/13 Hosp w/ Creat= 0.9-1.2 & GFR= 40-50 range; we decided to restart METFORMIN 500mg  Qam only... ~  Labs 8/13 on Metform500 showed BS= 118 ~  Labs 10/13 in hosp showed BS=113, A1c=6.3 ~  2/14: on Glimep1mg , off Metform due to mild RI; BS=85 & A1c=6.1; we decided to decr the Glimep1mg  to 1/2 tab/d. ~  4/14: she didn't decr the Glimep as prev discussed> asked to decr the Glimep1mg  to 1/2 tab Qam... ~  Labs 5/14 on Glim1mg -1/2 daily and BS=100  HYPOTHYROID >>  ~  Routine labs 5/14 showed TSH= 15.48 & we started SYNTHROID 52mcg/d... ~  Follow up Labds 8/14 on Synthroid50 showed TSH= 2.80, continue same...  MORBID OBESITY (ICD-278.01) - prev wt 227#  & 5\' 2"  tall for a BMI=41-42...  ~  weight 1/10 = 217# ~  weight 5/10 = 215# ~  weight 9/10 = 210# ~  weight 2/11 = 210# ~  weight 6/11 = 203# ~  weight 10/11 = 203# ~   Weight 6/12 = 195# ~  Weight 3/13 = 192# ~  Weight 7/13 = 192# ~  Weight 8/13 = 188# ~  Weight 11/13 = 187# ~  Weight 2/14 = 188# ~  Weight 5/14 = 186# ~  Weight 8/14 = 188#  GERD (ICD-530.81) - pt was placed on PROTONIX 40mg Bid 7/13 due to reflux symptoms and Anemia... ~  She is encouraged to f/u w/ GI & she indicates that she will call DrRehman when she is ready...  DIVERTICULOSIS, COLON (ICD-562.10) - had colonoscopy by DrDBrodie 1997 that was neg x divertics... now [place] in [place]...  IRRITABLE BOWEL SYNDROME (ICD-564.1)  RENAL INSUFFICIENCY >> Creat ~1.2 and GFR ~ 40-50 range...  DEGENERATIVE JOINT DISEASE, ADVANCED (ICD-715.90) - she has mod severe bilat knee pain and sees DrHarrison, Ortho for shots "every three months"... she is opposed to surgery (see his EMR notes- reviewed)...  VITAMIN D DEFICIENCY (ICD-268.9) ~  labs 5/10 showed Vit D level =13... therefore rec Vit D 50000 u weekly. ~  labs 2/11 showed Vit D level = 32... OK to change to VitD 1000 u daily.. ~  BMD done 6/11 showed TScores -1.3 in Spine, and -2.5 in left FemNeck... rec> start Alendronate 70mg /wk but she refuses Bisphos Rx "I'll take my chances"...  FIBROMYALGIA (ICD-729.1)  ANXIETY (ICD-300.00) - we prev perscribed HYDROXYZINE PAMOATE 25mg  Prn for "throat closing" symptoms... ~  7/13:  She admits that "my nerves are shot" after recent Hosp & Rx LORAZEPAM 0.5mg - 1/2 to 1 tab Tid as needed... ~  2/14:  She stopped the Ativan on her own & now using just the ELAVIL 10mg  & HYDROXYZINE 25mg  prn...  ANEMIA (ICD-285.9) - hx mild anemia w/ Hg = 10.8 Jan09 & started Fe daily at that time...  ~  labs 5/09 showed Hg= 11.7, Fe= 64 ~  labs 9/09 showed Hg= 11.4, Fe= 43... rec- take FeSO4 w/ Vit C 500mg /d. ~  labs 5/10 showed Hg= 11.3,  Fe= 50 ~  labs 2/11 showed Hg= 10.7, Fe= 36 (  12%)... rec> incr Fe Vit C to Bid. ~  labs 6/11 showed Hg= 10.9, Fe= 58 (19%sat)... continue Bid Fe ~  Labs 2/12 showed Hg=  11.6, Fe= 51 (16%sat)... Continue same. ~  Labs 3/13 showed Hg= 11.3.Marland KitchenMarland Kitchen Continue FeSO4 Bid... ~  Labs during 7/13 Hosp showed Hg= 8-9 range... rec to continue FeSO4 Bid... ~  Labs 8/13 showed Hg= 10.7 and Fe= 45 (16%)... Ok to decr Fe to one daily... ~  Labs 10/13 in hosp showed Hg= 10.1 ~  2/14: on Fe, VitC, VitB12; Hg=11.1, Fe=79 (27%sat); continue supplements. ~  4/14: on Fe, VitC, VitB12; Hg= 9-10 range; continue supplements... ~  5/14: on Fe, VitC, VitB12, etc; Labs shows Hg= 11.8 & Fe= 42 (13%); Rec to continue Fe/ VitC etc  INTERMITTENT VERTIGO (ICD-780.4) - prev eval DrWeymann w/ Meclizine Prn...   Past Surgical History  Procedure Laterality Date  . Cholecystectomy    . Tonsillectomy    . Hernia repair    . Cataract extraction    . Coronary angioplasty with stent placement  03/21/2012    Outpatient Encounter Prescriptions as of 08/14/2013  Medication Sig  . acetaminophen (TYLENOL) 500 MG tablet Take 1 tablet (500 mg total) by mouth every 6 (six) hours as needed for pain.  [name] amitriptyline (ELAVIL) 10 MG tablet TAKE (1) TABLET BY MOUTH ONCE DAILY.  [name] ascorbic acid (VITAMIN C) 500 MG tablet Take 500 mg by mouth 2 (two) times daily. Twice daily with iron tablet  . aspirin EC 81 MG tablet Take 81 mg by mouth every morning.   . cholecalciferol (VITAMIN D) 1000 UNITS tablet Take 1,000 Units by mouth daily.  [name] econazole nitrate 1 % cream Apply 1 application topically daily as needed. FOR FEET IRRITATION  . EFFIENT 5 MG TABS tablet TAKE 1 TABLET ONCE DAILY.  . ferrous sulfate 324 (65 FE) MG TBEC Take 1 tablet by mouth 2 (two) times daily.   . furosemide (LASIX) 40 MG tablet TAKE 1 TABLET ONCE DAILY.  [name] glimepiride (AMARYL) 1 MG tablet TAKE (1/2) TABLET BY MOUTH DAILY.  [name] glucose blood (ONE TOUCH TEST STRIPS) test strip Test once daily. DX;  250.00  . hydrOXYzine (ATARAX/VISTARIL) 25 MG tablet TAKE 1 TABLET EVERY 6 HOURS AS NEEDED FOR ITCHING OR ANXIETY.  [name] levothyroxine (SYNTHROID,  LEVOTHROID) 50 MCG tablet Take 1 tablet (50 mcg total) by mouth daily before breakfast.  . meclizine (ANTIVERT) 25 MG tablet Take 1 tablet (25 mg total) by mouth 3 (three) times daily as needed for dizziness.  . metoprolol (LOPRESSOR) 50 MG tablet Take 50 mg in the AM and 50 mg tablet in the PM  . nitroGLYCERIN (NITROSTAT) 0.4 MG SL tablet Place 1 tablet (0.4 mg total) under the tongue every 5 (five) minutes as needed for chest pain (up to 3 doses).  [name] ONE TOUCH LANCETS MISC Test once daily. Dx:  250.00  . pantoprazole (PROTONIX) 40 MG tablet TAKE (1) TABLET BY MOUTH ONCE DAILY.  . rosuvastatin (CRESTOR) 10 MG tablet Take 1 tablet (10 mg total) by mouth daily.  . vitamin B-12 (CYANOCOBALAMIN) 1000 MCG tablet Take 1,000 mcg by mouth every morning.  . [DISCONTINUED] metoprolol (LOPRESSOR) 50 MG tablet Take 50 mg in the AM and 25 mg (1/2) tablet in the PM  . [DISCONTINUED] furosemide (LASIX) 40 MG tablet Take 40 mg by mouth daily.     Allergies  Allergen Reactions  . Codeine Other (See Comments)    Reaction: severe GI pain  .  Diazepam Other (See Comments)    REACTION: causes pt unable to wake up after taking  . Lipitor [Atorvastatin Calcium] Other (See Comments)    Reaction: causes vertigo  . Penicillins Other (See Comments)    Reaction: unknown (childhood )  . Plavix [Clopidogrel Bisulfate]     Nonresponder  . Sulfonamide Derivatives Other (See Comments)    Reaction: unknown (childhood)  . Mango Flavor Rash    Current Medications, Allergies, Past Medical History, Past Surgical History, Family History, and Social History were reviewed in [employer] record.   Review of Systems        See HPI - all other systems neg except as noted...   The patient denies anorexia, fever, weight loss, weight gain, vision loss, hoarseness, chest pain, syncope, prolonged cough, headaches, hemoptysis, abdominal pain, melena, hematochezia, severe indigestion/heartburn, hematuria,  incontinence, suspicious skin lesions, transient blindness, depression, unusual weight change, abnormal bleeding, enlarged lymph nodes, and angioedema.   Objective:   Physical Exam      WD, Obese, 80 y/o WF in NAD... GENERAL:  Alert & oriented; pleasant & cooperative... she is very talkative... HEENT:  [place]/AT, Glasses, EOM-full, PERRLA, TMs-wax, NOSE-clear, THROAT-clear & wnl. NECK:  Supple w/ fairROM; no JVD; no carotid bruits; no thyromegaly or nodules palpated; no lymphadenopathy. CHEST:  decr BS bilat but clear w/o wheezing, rales or signs of consolidation... HEART:   sl irregular rhythm; without murmurs/ rubs/ or gallops detected... ABDOMEN:  Obese w/ panniculus, soft & nontender; normal bowel sounds; no organomegaly or masses palpated. EXT:  mod arthritic changes; no varicose veins/ +venous insuffic/ tr+edema bilat NEURO:   no focal neuro deficits... DERM:   no rash etc..[name]

## 2013-08-17 NOTE — Addendum Note (Signed)
Addended by: Boone MasterJONES, JESSICA E on: 08/17/2013 02:04 PM   Modules accepted: Orders

## 2013-08-17 NOTE — Progress Notes (Signed)
Quick Note:  Called spoke with patient, advised of lab results / recs as stated by TP. Pt verbalized her understanding and denied any questions. ______ 

## 2013-08-31 ENCOUNTER — Telehealth: Payer: Self-pay | Admitting: Cardiovascular Disease

## 2013-08-31 NOTE — Telephone Encounter (Signed)
Changed prescribing MD for lasix from Dr.Nadel to Dr.Koneswaran  [facility] to refill and deliver tomorrow

## 2013-08-31 NOTE — Telephone Encounter (Signed)
Pt states that pcp dr is  [facility] and she would like dr to start prescribing Furosemide for dr nadel, can not do it anymore.

## 2013-09-02 IMAGING — CR DG CHEST 1V PORT
1 series · 1 of 1 positions shown · non-contrast
Comparison: Chest x-ray 03/23/2011.

CLINICAL DATA: Chest pain.

PORTABLE CHEST - 1 VIEW

[view not recorded]
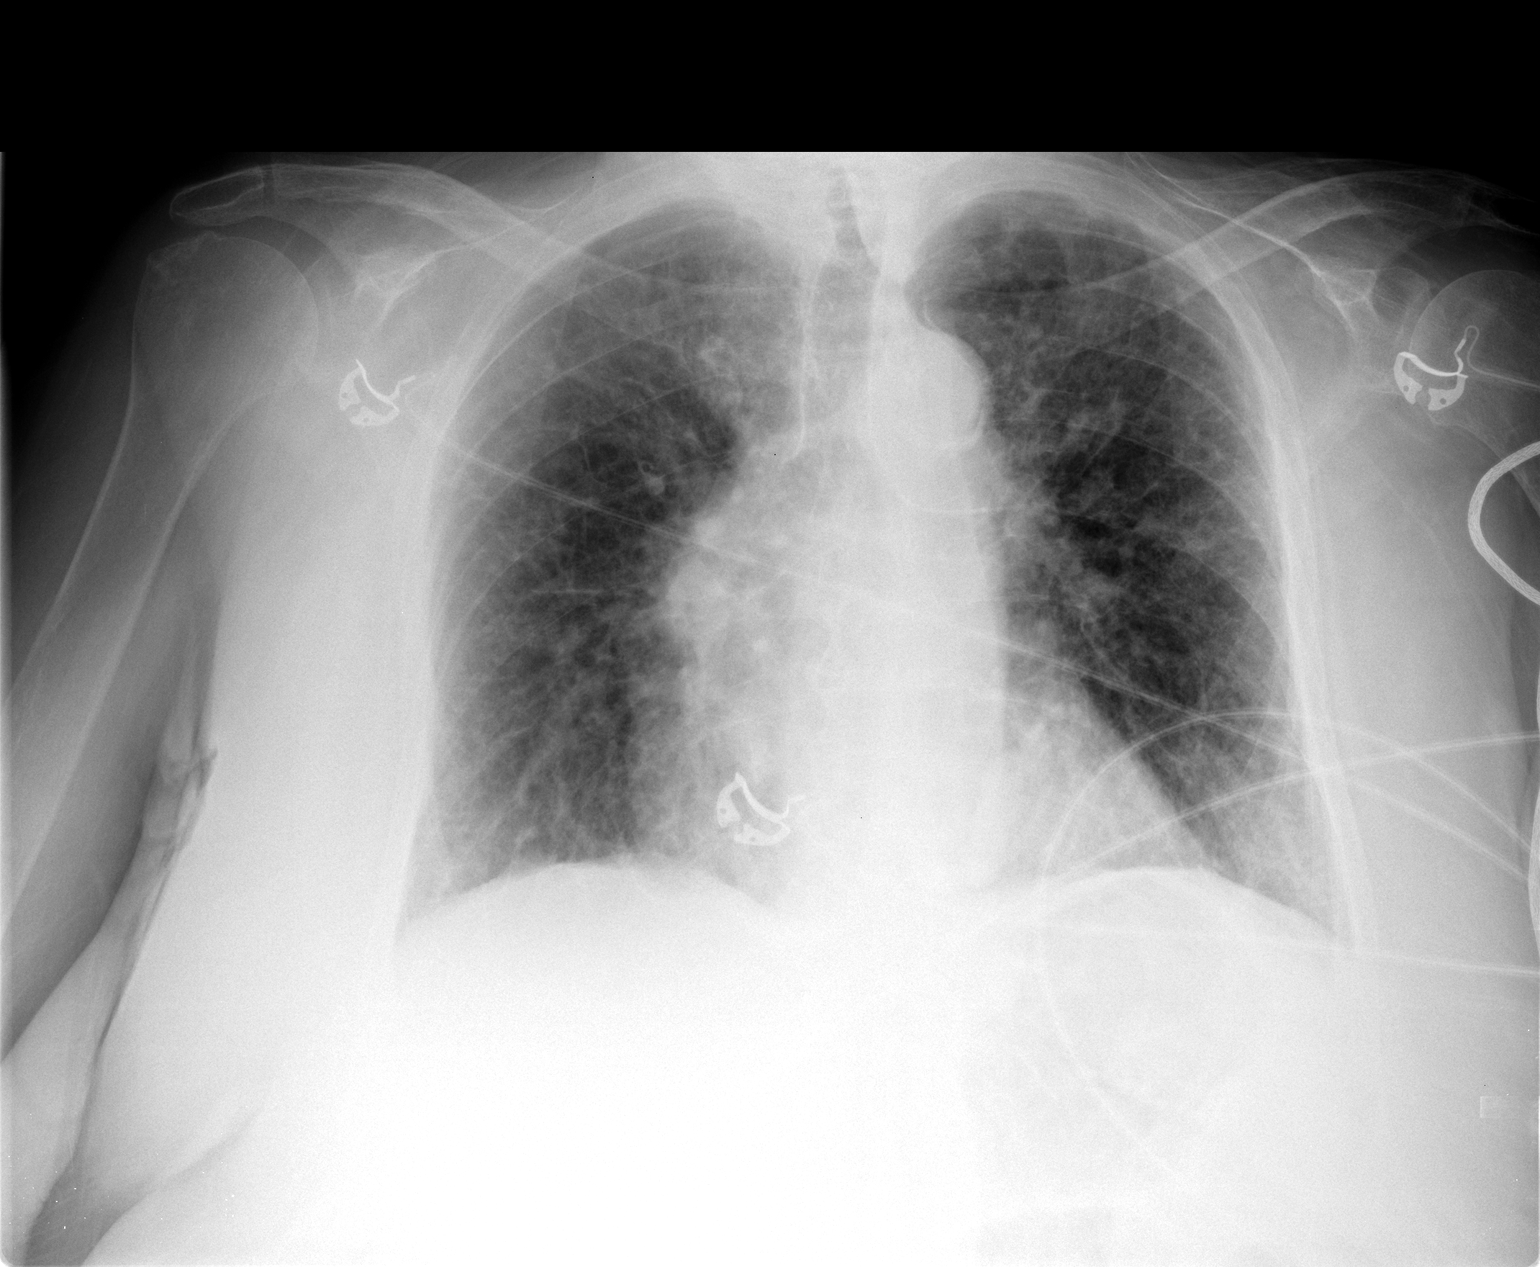

[1 of 1 positions shown; findings below may reference images not displayed]

FINDINGS: Lung volumes appear slightly low.  There is diffuse
interstitial prominence throughout the lungs bilaterally,
progressively increased over numerous prior examinations.  The
overall appearance is only slightly worsened when compared to the
most recent prior study from 03/28/2012.  No focal confluent
airspace consolidation.  No pleural effusions.  No definite
pulmonary edema (although this is difficult to entirely exclude
given the chronic interstitial changes).  Heart size is mildly
enlarged. The patient is rotated to the right on today's exam,
resulting in distortion of the mediastinal contours and reduced
diagnostic sensitivity and specificity for mediastinal pathology.
Atherosclerosis in the thoracic aorta.
IMPRESSION: 1.  Unusual appearance of the lungs, as above, progressively
worsened over numerous prior examinations.  The overall appearance
is favored to represent an underlying interstitial lung disease.
This could be better evaluated with follow-up non emergent high-
resolution chest CT if clinically indicated.
2.  Mild cardiomegaly.
3.  Atherosclerosis.

## 2013-09-13 ENCOUNTER — Ambulatory Visit (INDEPENDENT_AMBULATORY_CARE_PROVIDER_SITE_OTHER): Payer: Medicare Other | Admitting: Cardiovascular Disease

## 2013-09-13 VITALS — BP 138/74 | HR 76 | Ht 66.0 in | Wt 180.0 lb

## 2013-09-13 DIAGNOSIS — I10 Essential (primary) hypertension: Secondary | ICD-10-CM

## 2013-09-13 DIAGNOSIS — I5032 Chronic diastolic (congestive) heart failure: Secondary | ICD-10-CM

## 2013-09-13 DIAGNOSIS — I7090 Unspecified atherosclerosis: Secondary | ICD-10-CM

## 2013-09-13 DIAGNOSIS — I4891 Unspecified atrial fibrillation: Secondary | ICD-10-CM

## 2013-09-13 DIAGNOSIS — E785 Hyperlipidemia, unspecified: Secondary | ICD-10-CM

## 2013-09-13 DIAGNOSIS — I2510 Atherosclerotic heart disease of native coronary artery without angina pectoris: Secondary | ICD-10-CM

## 2013-09-13 MED ORDER — ROSUVASTATIN CALCIUM 10 MG PO TABS: 10.0000 mg | ORAL_TABLET | Freq: Every day | ORAL | Status: DC

## 2013-09-13 MED ORDER — PRASUGREL HCL 5 MG PO TABS: 5.0000 mg | ORAL_TABLET | Freq: Every day | ORAL | Status: DC

## 2013-09-13 NOTE — Progress Notes (Signed)
Patient ID: Alexandra [name], female   DOB: 1923-12-17, 80 y.o.   MRN: [id number]      SUBJECTIVE: Alexandra [name] is an 80 year old woman with CAD, status post hospitalization in March of 2014 with a non-ST elevation MI. She was found to have severe stenosis in the proximal LAD stent and complex CAD with moderately severe disease of the RCA and circumflex. She was offered CABG but refused. Per Dr. Clifton [name], attempted PCI of the RCA or circumflex should only be done if she had recurrent symptoms. He felt that PCI would be very difficult given the degree of calcium and tortuosity. She was continued on DAPT (Plavix nonresponder, on Effient). She was not placed on Coumadin or Xarelto in the setting of dual antiplatelet therapy for atrial fibrillation.  Echocardiography on 05-01-2013 revealed hyperdynamic LV systolic function, EF 65-70%, with a mild intracavitary gradient, mild aortic stenosis, mild to moderate mitral stenosis, and severe pulmonary hypertension.  She was hospitalized in 05/2013 for acute diastolic heart failure.   At her last visit, I started isosorbide dinitrate but this made her anxious and nervous. Thus, it was discontinued.  She denies cardiac chest pain and shortness of breath. She uses 2 L of oxygen at all times. She was diagnosed with arthritis of the manubriosternal joints. She has pain related to this which is only relieved with Tylenol. She has tried SL nitro for this but it hasn't helped. She tries to avoid sodium, and seldom has a fish sandwich from [employer]. She occasionally takes extra Lasix for leg swelling.    Allergies  Allergen Reactions  . Codeine Other (See Comments)    Reaction: severe GI pain  . Diazepam Other (See Comments)    REACTION: causes pt unable to wake up after taking  . Lipitor [Atorvastatin Calcium] Other (See Comments)    Reaction: causes vertigo  . Penicillins Other (See Comments)    Reaction: unknown (childhood )  . Plavix [Clopidogrel Bisulfate]      Nonresponder  . Sulfonamide Derivatives Other (See Comments)    Reaction: unknown (childhood)  . Mango Flavor Rash    Current Outpatient Prescriptions  Medication Sig Dispense Refill  . acetaminophen (TYLENOL) 500 MG tablet Take 1 tablet (500 mg total) by mouth every 6 (six) hours as needed for pain.      [name]. amitriptyline (ELAVIL) 10 MG tablet TAKE (1) TABLET BY MOUTH ONCE DAILY.  30 tablet  11  . ascorbic acid (VITAMIN C) 500 MG tablet Take 500 mg by mouth 2 (two) times daily. Twice daily with iron tablet      . aspirin EC 81 MG tablet Take 81 mg by mouth every morning.       . cholecalciferol (VITAMIN D) 1000 UNITS tablet Take 1,000 Units by mouth daily.      [name]. econazole nitrate 1 % cream Apply 1 application topically daily as needed. FOR FEET IRRITATION      . EFFIENT 5 MG TABS tablet TAKE 1 TABLET ONCE DAILY.  30 tablet  3  . ferrous sulfate 324 (65 FE) MG TBEC Take 1 tablet by mouth 2 (two) times daily.       . furosemide (LASIX) 40 MG tablet TAKE 1 TABLET ONCE DAILY.  30 tablet  2  . glimepiride (AMARYL) 1 MG tablet TAKE (1/2) TABLET BY MOUTH DAILY.  15 tablet  2  . glucose blood (ONE TOUCH TEST STRIPS) test strip Test once daily. DX;  250.00  50 each  6  . hydrOXYzine (  ATARAX/VISTARIL) 25 MG tablet TAKE 1 TABLET EVERY 6 HOURS AS NEEDED FOR ITCHING OR ANXIETY.  30 tablet  5  . levothyroxine (SYNTHROID, LEVOTHROID) 50 MCG tablet Take 1 tablet (50 mcg total) by mouth daily before breakfast.  30 tablet  11  . meclizine (ANTIVERT) 25 MG tablet Take 1 tablet (25 mg total) by mouth 3 (three) times daily as needed for dizziness.  50 tablet  6  . metoprolol (LOPRESSOR) 50 MG tablet Take 50 mg in the AM and 50 mg tablet in the PM      . ONE TOUCH LANCETS MISC Test once daily. Dx:  250.00  200 each  6  . pantoprazole (PROTONIX) 40 MG tablet TAKE (1) TABLET BY MOUTH ONCE DAILY.      . rosuvastatin (CRESTOR) 10 MG tablet Take 1 tablet (10 mg total) by mouth daily.  30 tablet  6  . vitamin B-12  (CYANOCOBALAMIN) 1000 MCG tablet Take 1,000 mcg by mouth every morning.      . nitroGLYCERIN (NITROSTAT) 0.4 MG SL tablet Place 1 tablet (0.4 mg total) under the tongue every 5 (five) minutes as needed for chest pain (up to 3 doses).  25 tablet  4   No current facility-administered medications for this visit.    Past Medical History  Diagnosis Date  . Asthmatic bronchitis   . Sarcoidosis     a. 1990s of lung tx with prednisone temporarily.  [name] HTN (hypertension)   . PVD (peripheral vascular disease)     a. 06/2012 Carotid U/S: < 50% bilat ICA stenosis.  . Hyperlipemia   . DM (diabetes mellitus)   . Obesity   . GERD (gastroesophageal reflux disease)   . Diverticulosis of colon   . IBS (irritable bowel syndrome)   . DJD (degenerative joint disease)     Hip and knee pain; hip bursitis; patellar subluxation  . Vitamin D deficiency disease   . Fibromyalgia   . Anxiety     a. Guaiac neg 03/2012.  . Intermittent vertigo   . Anemia   . Venous insufficiency   . PSVT (paroxysmal supraventricular tachycardia)     a. Prior hx of palpitations - developed SVT during stress Myoview 09/2011. b. Noted on tele 03/2012.  . Valvular heart disease     a. moderately thickened/mildly calcified aortic valve, mod MR 03/20/12  . CKD (chronic kidney disease), stage II   . PAC (premature atrial contraction)   . CAD (coronary artery disease) 03/2011    a. NSTEMI 03/2011 complicated by pulm edema s/p DES to subtotal LAD. b. [place] s/p PTCA/cutting balloon to prox LAD for ISR 12/01/11 (med rx for RCA/Cx dz).  c. NSTEMI 03/2012 s/p cutting angioplasty of ISR of prox LAD 03/21/12 (PLAVIX NONRESPONDER). d. DES to LAD for ISR 07/2012 (pt refused consideration of CABG).  [name] A-fib     a. Noted 07/2012 - not on anticoag due for need for DAPT and age.  . Thrombocytopenia     a. Noted 07/2012.  . On home oxygen therapy     2L/min with ambulation as of 07/2012.    Past Surgical History  Procedure Laterality Date  .  Cholecystectomy    . Tonsillectomy    . Hernia repair    . Cataract extraction    . Coronary angioplasty with stent placement  03/21/2012    History   Social History  . Marital Status: Widowed    Spouse Name: N/A    Number of Children: 1  .  Years of Education: N/A   Occupational History  . Not on file.   Social History Main Topics  . Smoking status: Never Smoker   . Smokeless tobacco: Never Used  . Alcohol Use: No     Comment: social  . Drug Use: No  . Sexual Activity: No   Other Topics Concern  . Not on file   Social History Narrative   Pt is only child. Husband Duanne GuessDewey passed away in 2004   Pt has 1 son in [place](his wife is MD doing [employer] Research )      Filed Vitals:   09/13/13 1048  Height: 5\' 6"  (1.676 m)  Weight: 180 lb (81.647 kg)   BP 138/74  Pulse 76   PHYSICAL EXAM General: NAD  Neck: No JVD, no thyromegaly or thyroid nodule.  Lungs: Clear to auscultation bilaterally with normal respiratory effort.  CV: Nondisplaced PMI. Heart regular S1/S2, no S3/S4, II/VI holosystolic murmur. 1+ left leg pretibial edema. No carotid bruit. Normal pedal pulses.  Abdomen: Soft, nontender, no hepatosplenomegaly, no distention.  Neurologic: Alert and oriented x 3.  Psych: Normal affect.  Extremities: No clubbing or cyanosis.    ECG: reviewed and available in electronic records.    ASSESSMENT AND PLAN: 1. CAD: Remains on ASA, Crestor, Effient, and metoprolol. Symptomatically stable.  2. Atrial fibrillation: Rate is controlled. No anticoagulation due to being on dual antiplatelet therapy.  3. Hyperlipidemia: On Crestor.  4. HTN: Well controlled on current therapy.  5. Chronic diastolic heart failure: she is on Lasix 40 mg daily and did not tolerate isosorbide dinitrate.   Dispo: f/u 3-4 months.    Prentice DockerSuresh Lakiah Dhingra, M.D., F.A.C.C.

## 2013-09-13 NOTE — Patient Instructions (Signed)
Your physician wants you to follow-up in: 4 months You will receive a reminder letter in the mail two months in advance. If you don't receive a letter, please call our office to schedule the follow-up appointment.   Your physician recommends that you continue on your current medications as directed. Please refer to the Current Medication list given to you today.      Thank you for choosing [facility] !         

## 2013-09-19 ENCOUNTER — Ambulatory Visit (INDEPENDENT_AMBULATORY_CARE_PROVIDER_SITE_OTHER): Payer: Medicare Other | Admitting: Orthopedic Surgery

## 2013-09-19 VITALS — BP 121/68 | Ht 66.0 in | Wt 180.0 lb

## 2013-09-19 DIAGNOSIS — IMO0002 Reserved for concepts with insufficient information to code with codable children: Secondary | ICD-10-CM

## 2013-09-19 DIAGNOSIS — M1710 Unilateral primary osteoarthritis, unspecified knee: Secondary | ICD-10-CM

## 2013-09-19 NOTE — Progress Notes (Signed)
Patient ID: Traci SermonJudith T [name], female   DOB: 02-18-1924, 80 y.o.   MRN: [id number]  Chief Complaint  Patient presents with  . Follow-up    Repeat injections bilateral knees    Knee  Injection Procedure Note  Pre-operative Diagnosis: left knee oa  Post-operative Diagnosis: same  Indications: pain  Anesthesia: ethyl chloride   Procedure Details   Verbal consent was obtained for the procedure. Time out was completed.The joint was prepped with alcohol, followed by  Ethyl chloride spray and A 20 gauge needle was inserted into the knee via lateral approach; 4ml 1% lidocaine and 1 ml of depomedrol  was then injected into the joint . The needle was removed and the area cleansed and dressed.  Complications:  None; patient tolerated the procedure well. Knee  Injection Procedure Note  Pre-operative Diagnosis: right knee oa  Post-operative Diagnosis: same  Indications: pain  Anesthesia: ethyl chloride   Procedure Details   Verbal consent was obtained for the procedure. Time out was completed.The joint was prepped with alcohol, followed by  Ethyl chloride spray and A 20 gauge needle was inserted into the knee via lateral approach; 4ml 1% lidocaine and 1 ml of depomedrol  was then injected into the joint . The needle was removed and the area cleansed and dressed.  Complications:  None; patient tolerated the procedure well.

## 2013-09-19 NOTE — Patient Instructions (Signed)

## 2013-10-03 ENCOUNTER — Other Ambulatory Visit: Payer: Self-pay | Admitting: Pulmonary Disease

## 2013-10-10 ENCOUNTER — Encounter: Payer: Self-pay | Admitting: Cardiology

## 2013-10-19 ENCOUNTER — Other Ambulatory Visit: Payer: Self-pay | Admitting: Pulmonary Disease

## 2013-11-15 ENCOUNTER — Encounter: Payer: Self-pay | Admitting: Pulmonary Disease

## 2013-11-15 ENCOUNTER — Ambulatory Visit (INDEPENDENT_AMBULATORY_CARE_PROVIDER_SITE_OTHER): Payer: Medicare Other | Admitting: Pulmonary Disease

## 2013-11-15 ENCOUNTER — Telehealth: Payer: Self-pay | Admitting: Pulmonary Disease

## 2013-11-15 VITALS — BP 118/76 | HR 71 | Temp 97.6°F | Ht 66.0 in | Wt 180.4 lb

## 2013-11-15 DIAGNOSIS — F419 Anxiety disorder, unspecified: Secondary | ICD-10-CM

## 2013-11-15 DIAGNOSIS — E119 Type 2 diabetes mellitus without complications: Secondary | ICD-10-CM

## 2013-11-15 DIAGNOSIS — I4891 Unspecified atrial fibrillation: Secondary | ICD-10-CM

## 2013-11-15 DIAGNOSIS — I739 Peripheral vascular disease, unspecified: Secondary | ICD-10-CM

## 2013-11-15 DIAGNOSIS — E663 Overweight: Secondary | ICD-10-CM

## 2013-11-15 DIAGNOSIS — M1990 Unspecified osteoarthritis, unspecified site: Secondary | ICD-10-CM

## 2013-11-15 DIAGNOSIS — I5032 Chronic diastolic (congestive) heart failure: Secondary | ICD-10-CM

## 2013-11-15 DIAGNOSIS — I7090 Unspecified atherosclerosis: Secondary | ICD-10-CM

## 2013-11-15 DIAGNOSIS — I509 Heart failure, unspecified: Secondary | ICD-10-CM

## 2013-11-15 DIAGNOSIS — D869 Sarcoidosis, unspecified: Secondary | ICD-10-CM

## 2013-11-15 DIAGNOSIS — I872 Venous insufficiency (chronic) (peripheral): Secondary | ICD-10-CM

## 2013-11-15 DIAGNOSIS — E559 Vitamin D deficiency, unspecified: Secondary | ICD-10-CM

## 2013-11-15 DIAGNOSIS — I10 Essential (primary) hypertension: Secondary | ICD-10-CM

## 2013-11-15 DIAGNOSIS — I2510 Atherosclerotic heart disease of native coronary artery without angina pectoris: Secondary | ICD-10-CM

## 2013-11-15 DIAGNOSIS — R609 Edema, unspecified: Secondary | ICD-10-CM | POA: Insufficient documentation

## 2013-11-15 DIAGNOSIS — E785 Hyperlipidemia, unspecified: Secondary | ICD-10-CM

## 2013-11-15 NOTE — Telephone Encounter (Signed)
Pt is going to see SN today at 11:30. Nothing further needed

## 2013-11-15 NOTE — Progress Notes (Signed)
Subjective:    Patient ID: Alexandra [name], female    DOB: Oct 23, 1923, 80 y.o.   MRN: [id number]  HPI 80 y/o WF here for a follow up visit... he has multiple medical problems including AB, hx inactive Sarcoid, HBP, PVD, VI, Hyperlipidemia, DM, Obesity, GERD/ Divertics/ IBS, DJD/ FM/ osteop, Anxiety, Anemia... ~  SEE PREV EPIC NOTES FOR OLDER DATA >>   ~  August 25, 2011:  66moROV & we reviewed her interval developments & she continues to f/u w/ DrRothbart regularly...    She had nonQ-MI 10/12 & cath showed severe 3 vessel CAD w/ PTCA/ stent placed in LAD (99% occluded) + med rx w/ Plavix added; there was AK anteriorly but LVF was preserved; DrRothbart did a Myoview scan 3/13 w/ SVT developing after the infusion w/ CP & abn STsegment changes; she spont reverted to NSR w/o need for intervention; Myoview images showed scar anteroseptal area + soft tissue attenuation & minor ischemia at the apex & EF=77%;  They incr her Metoprolol to 76mid...    [place]Harrison [place] for a left knee injection. LABS 3/13:  FLP- at goals on Simva40 x TG=157;  Chems- ok x BS=126 A1c=6.0 on Metform;  CBC- Hg=11.3;  TSH=4.11;  BNP=309  ~  December 07, 2011:  53m49moV & JudBethena Royss Adm 7/1 - 12/04/11 w/ unstable angina (pain involved her neck & throat, then across her chest to arms) and cath showed another 99% focal proxLAD stenosis within the prev stent & mod RCA & Circ disease (including a 90% mid-distal RCA lesion- felt too difficult to intervene); she had a PTCA/ cutting balloon for the LAD in stent restenosis; continue ASA/ Plavix; continue other meds including Metoprolol, Amlodipine, Avapro, Lasix, Simvastatin...  She was also noted to be ANEMIC w/ Hg in the 8-9 range, stools reported neg & she is on FeSO4;  Renal function is borderline w/ Creat in the 0.9-1.2 range & GFR 40-50 range (her Metformin was held in hosp & review of DM control has been good on MetformBid so we decided to restart at just 500m55mm)... Finally  she notes that "my nerves are shot" and we will rec Lorazepam 0.5mg-19m2 to 1 tab Tid as needed...    We reviewed prob list, meds, xrays and labs> see below for updates>> CXR 7/13 showed cardiomegaly & mild vasc congestion... LABS 7/13:  Chems- BS=144 BUN=16 Creat=1.17;  CBC- Hg=8.7  ~  April 20, 2012:  73mo R41mo Alexandra [name] wBethena Roysdm 10/20 - 03/24/12 by Cards w/ NSTEMI & cath showed in-stent restenosis LAD- s/p cutting balloon angioplasty; she also had resid dis in RCA & CIRC- continue med rx;  They also noted HBP w/ hypotension this adm, asymptomatic SVT on tele, valv heart dis w/ thickened/ calcified AoV & modMR, mod carotid stenoses, mild renal insuffic... Followed by DrRothbart & cardiology notes reviewed & Med Rec performed>>    AB, Hx Sarcoid> not currently on any meds for breathing...    HBP> on Metop50-1.5tabsBid, Amlod2.5, Avapro150, Lasix40; BP=124/56 & she is feeling ok- denies CP, palpit, ch in SOB, edema, etc...    CAD> on ASA81, Effient5, & off Plavix; Hosp 10/13 as above, now improved; she saw CardsPA 11/13- stable, continue same meds...    ASPVD> on above meds; known bilat 50-69% ICA stenoses but she is asymptomatic w/o cerebral ischemic symptoms; she is wat too sedentary 7 denies claud symptoms...    VI> she knows to elevate legs, elim sodium, use  support hose, etc...    Hyperlipid> on Cres10, off Simva due to Norvasc Rx added; we will need to f/u w/ FLP on the Cres10...    DM> on Glimep41m, off Metform due to mild RI; BS~110 & A1c=6.3; she knows to avoid sweets, watch sugars...    Overweight> weight = 187# w/ BMI=30; we reviewed diet & exercise...    GI> GERD, Divertics, IBS> on Protonix40Bid; she denies abd pain, n/v, c/d, blood seen...    Renal Insuffic> Creat~ 1.3-1.4 range, they stopped her Metform in hosp & used Glimep1100mat discharge...    DJD, FM> on Tylenol alone; she saw DrHarrison 2wks ago for bilat knee injections- improved...    Vit D defic> on VitD 1000u OTC; continue  same...    Anxiety> on Elavil10, off Lorazepam;     Anemia> on Fe, VitC, VitB12; Hg=10.1, MCV=89, continue supplements... We reviewed prob list, meds, xrays and labs> see below for updates >>  CXR 10/13 showed stable heart size, diffuse mitral annular calcif, clear lungs, DJD in TSpine LABS 10/13:  Chems- ok w/ BUN=25-29 Creat=1.3-1.4 BS=113-116 A1c=6.3;  CBC- Hg=9.9-10.1    ~  July 25, 2012:  53m60moV & Alexandra [name] quips "I'm still here" w/ continued cardiac problems, angina, & need for NTG rx... She notes her "nerves run away w/ me" & the Vistaril helps... She is not [place]... We reviewed the following medical problems during today's office visit >>     AB, Hx Sarcoid> not currently on any meds for breathing; stable, inactive dis...    HBP> on Metop50-1.5tabsBid, Amlod2.5, Avapro150, Lasix40; BP=118/72 & she is feeling ok- denies current CP, palpit, ch in SOB, edema, etc...    CAD> on ASA81, Effient5, & off Plavix; Hosp 10/13 as above, now improved; she saw CardsPA 11/13- stable, continue same meds...    ASPVD> on above meds; known bilat 50-69% ICA stenoses but she is asymptomatic w/o cerebral ischemic symptoms; she is wat too sedentary & denies claud symptoms...    VI> she knows to elevate legs, elim sodium, use support hose, etc...    Hyperlipid> on Cres10, off Simva due to Norvasc Rx added; we will need to f/u w/ FLP on the Cres10...    DM> on Glimep1mg69mff Metform due to mild RI; BS=85 & A1c=6.1; we decided to decr the Glimep1mg 55m1/2 tab/d...    Overweight> weight = 188# w/ BMI=30; we reviewed diet & exercise...    GI> GERD, Divertics, IBS> on Protonix40Bid; she denies abd pain, n/v, c/d, blood seen...    Renal Insuffic> Creat~ 1.3-1.4 range, they stopped her Metform in hosp & used Glimep1mg a22mischarge...    DJD, FM> on Tylenol alone; she saw DrHarrison 2wks ago for bilat knee injections- improved...    Vit D defic> on VitD 1000u OTC; continue same...     Anxiety> on Elavil10, and Hydroxyzine25 prn, off Lorazepam; states the Hydroxyzine helps...    Anemia> on Fe, VitC, VitB12; Hg=11.1, Fe=79 (27%sat); continue supplements... We reviewed prob list, meds, xrays and labs> see below for updates >>  LABS 2/14:  Chems- ok w/ Cr=1.3 BS=85 A1c=6.1;  CBC- Hg=11.1 Fe=79 (27%sat)...  ~  September 09, 2012:  6wk ROV & Alexandra [name] wBethena Roysnce again hosp in the interim> ADM 3/19 - 08/22/12 by Cards w/ CP & NSTEMI due to severe restenosis in the prox LAD stent, s/p DES to LAD, pt has refused CABG; she is a Plavix non-respnder; she also  had AFib on rate control strategy (not on anticoag), meds adjusted as indicated below...  Today she is c/o lower sternal & costochondral CWP which is sore & tender=> we discussed REST, HEAT, Tramadol tid prn...    HBP> on Metop50-decr to 1AM & 1/2PM, Lasix40, & off Amlod & Avapro; BP=120/72 & she is feeling ok- denies current CP, palpit, ch in SOB, edema, etc...    CAD> on ASA81, Effient5 & off Plavix; Hosp 10/13 & 3/14 as noted- w/ NSTEMI & PTCA/ DES to LAD- she has refused CABG; also had AFib & they decided on rate control; she had f/u [facility] 4/14- continue duel antiplat Rx w/ ASA & Effient, meds adjusted...     AFib> she was found to have AFib 3/14 adm 7 they decided on rate control strategy as noted...    ASPVD> on above meds; known bilat 50-69% ICA stenoses but she is asymptomatic w/o cerebral ischemic symptoms; she is wat too sedentary & denies claud symptoms...    VI> she knows to elevate legs, elim sodium, use support hose, etc...    Hyperlipid> on Cres10, off Simva due to Norvasc Rx added; we will need to f/u w/ FLP on the Cres10 (it was not checked during the recent hosp)...    DM> on Glimep22m, off Metform due to mild RI; BS=85 & A1c=6.1; we decided to decr the Glimep134mto 1/2 tab/d & have made this change in Epic...    Overweight> weight = 189# w/ BMI=30; we reviewed diet & exercise...    GI> GERD, Divertics, IBS> on Protonix40Bid; she  denies abd pain, n/v, c/d, blood seen...    Renal Insuffic> Creat~ 1.3-1.4 range, and stable during recent hosp...    Anxiety> on Elavil10, and Hydroxyzine25 prn, off Lorazepam; states the Hydroxyzine helps...    Anemia> on Fe, VitC, VitB12; Hg= 9-10 range, Fe=79 (27%sat); continue supplements... We reviewed prob list, meds, xrays and labs> see below for updates >>   ~  Oct 21, 2012:  6wk ROV & last visit JuBethena Roysas c/o CWP for which we rec Rest/ Heat/ Tramadol; we also decided to decr her Glimep1m21mack to 1/2 tab Qam; she tells me that stopped the Tramadol as she feels it reacted on her w/ confusion etc- she states that if she takes ASA81m70mone Tylenol325mg13mt she does just fine ("I take this at night & sleep like a baby")... She wanted me to know that she planned her funeral last week!    She had f/u w/ Cards 4/14> HBP, CAD- NSTEMI, AFib, Carotid stenosis> on ASA81 & Effient5 w/o recurrent ischemic symptoms; on Metoprolol & Lasix, BP= 120/64 & she notes heart "a flippin & a flappin on occas"...    Lipids controlled on Cres10 w/ last FLP 10/13 showing TChol 116, TG 97, HDL 46, LDL 51    BS has remained good per pt since we decr the Glimep1mg t43m/2 Qam; lab today shows BS= 100; continue same...    Her weight is down 3# to 186# today...    Anemia> on Fe, VitC, Vit B12, etc; Labs shows Hg= 11.8 & Fe= 42 (13%); Rec to continue Fe/ VitC etc... We reviewed prob list, meds, xrays and labs> see below for updates >>  LABS 5/14:  Chems- ok w/ BS=100, BUN=26, Cr=1.3;  CBC- ok w/ Hg=11.8 & Fe=42 (13%);  TSH=15.48  ADDENDUM>>  TSH= 15.48 & we will start SYNTHROID50mcg/74m Follow up labs 01/06/13 showed TSH= 2.80 therefore continue Levothyroid 50mcg/d38m ~Marland KitchenMarland Kitchen  January 23, 2013:  50moROV & Alexandra [name] is concerned about the $$ of her Effient & Crestor, but taking both regularly;  We reviewed the following medical problems during today's office visit >>     She had f/u w/ Cards 4/14> HBP, CAD- NSTEMI, AFib, Carotid  stenosis> on ASA81 & Effient5 w/o recurrent ischemic symptoms; on Metoprolol-50AM&25PM + Lasix40, BP= 138/64 & she denies recent CP, palpit, dizzy, ch in SOB/edema; DrRothbart has retired & she will be seeing DrKoneswaran soon...    Lipids controlled on Cres10 w/ last FLP 10/13 showing TChol 116, TG 97, HDL 46, LDL 51; reminded to come FASTING for blood work!    BS has remained good per pt since we decr the Glimep161mto 1/2 Qam; lab 5/14 shows BS= 100; & last A1c 2/14 = 6.1    Thyroid is now replaced on Synthroid50 w/ f/u TSH 8/14 = 2.80, continue same...    Her weight is up 3# to 188# today...    She still sees DrSHarrison every 3-56m99mor shots in her knees & she tells me she is going to see him tomorrow...    Anemia> on Fe, VitC, Vit B12, etc; Labs 5/14 showed Hg= 11.8 & Fe= 42 (13%); Rec to continue Fe/ VitC etc... We reviewed prob list, meds, xrays and labs> see below for updates >>   ~  November 15, 2013:  9-29m39mo and add-on appt for SOB> [name], [place], now back home... She is on home O2 24/7 and has her own pulse ox checker... She gives a long rambling hx but is overall doing well- has walker, oxygen, and does her own ADLs- no new complaints or concerns...    Hx AB, Hx Sarcoid, PulmHTN> on O2 at 2L/min 24/7; PAsys=75 (see 2DEcho below);     She is followed by DrKoEleonore Chiquito Cards, seen 4/15 for her HBP, ASHD-s/p MI, Chr diastolicCHF, AFib, & HL> on Metop50Bid, Lasix40, ASA81, Effient5; she has refused CABG, and is a Plavix nonresponder & INTOL to Imdur; no changes made & he checks her Q3-56mo.66mo   DM> on Glimep1mg- 73ming 1/2 tab qam; last labs 3/15 showed BS=109, A1c=6.6; advised same med, better diet, continue wt loss efforts (down 8# to 180# today)...     Chol> on Cres10 but c/o the cost; last FLP was 10/13 showing TChol 116, TG 97, HDL 46, LDL 51    Hypothyroid> on Synthroid50 & last TSH was 3/15 = 1.82, stable, continue same...     She  still sees DrHarrison, Ortho every 8mo fo66molat knee injections- last 4/15 and notes reviewed...     Anxiety> on Elavil10/d & Atarax25 prn We reviewed prob list, meds, xrays and labs> see below for updates >>   CXR 11/14 showed cardiomegaly, pulm vasc congestion, prom right hilum, boney demineralization...   2DEcho 12/14 showed severeLVH, hyperdynamic LV w/ EF=65-70%, mild intracavity gradient, mildAS, mild to mod MS, severely dil LA&RA, & severe pulmHTN w/ PAsys=75   LABS 3/15:  Chems- ok w/ BS=109, A1c=6.6, Cr=1.2;  CBC- ok w/ Hg=11.9;  TSH=1.82...          Problem List:  Hx of ASTHMATIC BRONCHITIS, ACUTE (ICD-466.0) - uses Mucinex as needed... she has chr stable DOE w/o change... she say she walks "some" but is obviously too sedentary due to her arthritis... she has PNEUMOVAX several yrs ago.  Hx of SARCOIDOSIS (ICD-135) - initially diagnosed in 1992 w/ CXR showing diffuse  ILDis, +Gallium scan, ACE=61, & Bronch w/ bx showing non-caseating granulomas... treated w/ Pred & weaned off w/ no active disease since then... last CTChest 7/04 w/ ca++ Ao & coronaries, +mediastinal fat, centilobular emphysema, some scarring... stable without new symptoms of cough, sputum, CP or dyspnea... ~  CXR 1/09 showed stable chr lung changes & prom right hilum... ~  CXR 2/11 showed diffuse ILDz, no adenopathy, NAD.[name]. ~  CXR 2/12 showed borderline cardiomeg, tort Ao, sl incr markings as before, NAD.[name]. ~  CXR 10/12 in hosp showed cardiomeg, calcif in Ao, vasc congestion w/ pulm edema & sm effusions... ~  CXR 7/13 in hosp showed cardiomegaly & mild vasc congestion... ~  CXR 10/13 showed stable heart size, diffuse mitral annular calcif, clear lungs, DJD in TSpine ~  CXR 11/14 showed cardiomegaly, pulm vasc congestion, prom right hilum, boney demineralization  HYPERTENSION (ICD-401.9) - controlled on meds: METOPROLOL 28m- 1.5 tabsBid, AMLODIPINE 2.586md, AVAPRO 15048m, LASIX 40m62m.. ~  Myoview 12/05 was neg-  no ischemia, no infarct... ~  EKG 2/11 showed NSR, rate 82/min, WNL... ~[name] 4/12:  DrHochrein increased her Metoprolol from 25Bid to 50Bid for her BP & ectopy... ~  6/12:  BP= 136/74 & denies HA, visual changes, CP, palipit, syncope, change in dyspnea, edema, etc... ~  3/13:  BP= 124/70 & she is feeling well, denies CP/ SOB/ ch in edema/ etc... ~  7/13:  BP= 124/60 & these meds were not changed during her recent hosp for angina w/ cath & PCI for LAD in-stent restenosis... ~  8/13:  BP= 130/60 & she denies CP, palpit, ch in SOB or edema... ~  11/13:  BP= 124/56 on same 4med42mgimen & she denies CP, palpit, SOB, edema... ~  2/14: on Metop50-1.5tabsBid, Amlod2.5, Avapro150, Lasix40; BP=118/72 & she is feeling ok- denies current CP, palpit, ch in SOB, edema, etc. ~  4/14: on Metop50-decr to 1AM & 1/2PM, Lasix40, & off Amlod & Avapro; BP=120/72 & she is feeling ok- denies current CP, palpit, ch in SOB, edema, etc. ~  6/15: [facility], seen 4/15 for her HBP, ASHD-s/p MI, Chr diastolicCHF, AFib, & HL> on Metop50Bid, Lasix40, ASA81, Effient5; she has refused CABG, and is a Plavix nonresponder & INTOL to Imdur; BP= 118/76, no changes made & he checks her Q3-70mo..10moVALVULAR HEART DISEASE>  PALPITATIONS & Ventric Ectopy>  Eval & Rx by DrHochrein, his notes are reviewed... On METOPROLOL 75mgBi19mshe uses HYDROXYZINE (Vistaril) 25mg Pr90mich she says is helpful... ~  Event Monitor done 4/12 and no definite AFib encountered, just PVCs...  ~  EKG shows just some PACs, otherw neg... ~  2DEcho 10/13 showed mild LVH, norm LVF w/ EF=65-70%; AoV w/ mildly thickened & calcif leaflets; heavy mitral annular calcif, mod thickened leaflets, & modMR... ~  2DEcho 12/14 showed severeLVH, hyperdynamic LV w/ EF=65-70%, mild intracavity gradient, mildAS, mild to mod MS, severely dil LA&RA, & severe pulmHTN w/ PAsys=75...  CORONARY ARTERY DISEASE >> on ASA 81mg/d &39mIENT5mg (off 70mvix now);  plus the meds above & followed by DrRothbart... ~  She had nonQ-MI 10/12 & cath showed severe 3 vessel CAD w/ PTCA/ stent placed in LAD (99% occluded) + med rx w/ Plavix added; there was AK anteriorly but LVF was preserved... ~  EKG 11/12 showed NSR, rate85, PACs, septal infarct, otherw neg... ~  DrRothbart did a Myoview scan 3/13 w/ SVT developing after the infusion w/ CP & abn STsegment changes;  she spont reverted to NSR w/o need for intervention; Myoview images showed scar anteroseptal area + soft tissue attenuation & minor ischemia at the apex & EF=77%; They incr her Metoprolol to 37mBid... ~  7/13:  [facility] showing 99% prox LAD in-stent restenosis- PCI w/ cutting ballon & improved; she had mod dis in RCA & Circ (including worsening 90% distal RCA stenosis- but intervention was r/o)... EKG showed NSR, PACs, rate66, otherw wnl, NAD... ~  10/13:  [facility] & cath showed in-stent restenosis LAD- s/p cutting balloon angioplasty; she also had resid dis in RCA & CIRC- continue med rx;  They also noted HBP w/ hypotension this adm, asymptomatic SVT on tele, valv heart dis w/ thickened/ calcified AoV & modMR, mod carotid stenoses, mild renal insuffic... Followed by DrRothbart & cardiology notes reviewed & Med Rec performed... ~  2/14: on ASA81, Effient5, & off Plavix; Hosp 10/13 as above, now improved; she saw CardsPA 11/13- stable, continue same meds... ~  4/14: on ASA81, Effient5 & off Plavix; Hosp 10/13 & 3/14 w/ NSTEMI & PTCA/ DES to LAD- she has refused CABG; also had AFib & they decided on rate control; she had f/u OV Cards 4/14- continue duel antiplat Rx w/ ASA & Effient, meds adjusted. ~  6/15: She is followed by DEleonore Chiquitofor Cards, seen 4/15 for her HBP, ASHD-s/p MI, Chr diastolicCHF, AFib, & HL> on Metop50Bid, Lasix40, ASA81, Effient5; she has refused CABG, and is a Plavix nonresponder & INTOL to Imdur; no changes made & he checks her Q3-470mo.    PERIPHERAL VASCULAR DISEASE (ICD-443.9) - on ASA 81, EFFIENT5... doing well w/o claud symptoms or cerebral ischemic symptoms... ~  Art Dopplers of LE's 6/06 showed biphasic waveforms in ant tib & peroneal arts, normal ABI's... ~  CDopplers 5/12 showed mild heterogeneous plaque bilat w/ 40-59% bilat ICA stenoses... ~  CDopplers 4/13 showed mild to mod plaque bilat, 50-69% bilat ICA stenoses (lower end but R>L)...  VENOUS INSUFFICIENCY (ICD-459.81) - on low sodium diet and the Lasix... she knows to elevate legs, wear support hose, etc... ~  7/13:  She has persistent bilat LE edema & weight is similar 192# on LASIX 4058m   HYPERLIPIDEMIA (ICD-272.4) - on SIMVASTATIN 59m10m.. ~  FLP [place]8 showed TChol 121, TG 120, HDL 46, LDL 51... looks great- continue Simva40. ~  FLP [place]9 showed TChol 137, TG 165, HDL 37, LDL 67 ~  FLP 9/09 showed TChol 133, TG 141, HDL 47, LDL 57 ~  FLP 5/10 showed TChol 131, TG 190, HDL 46, LDL 47 ~  FLP 2/11 showed TChol 129, TG 129, HDL 52, LDL 51 ~  FLP 2/12 showed TChol 134, TG 159, HDL 49, LDL 53 ~  FLP 3/13 on Simva40 showed TChol 112, TG 157, HDL 46, LDL 35... Continue same + better low fat diet. ~  FLP 10/13 on Cres10 showed TChol 116, TG 97, HDL 46, LDL 51... Changed to Cres10 during 10/13 adm...  DM (ICD-250.00) - on diet + GLIMEPIRIDE => see below... ~  labs 1/08 showed BS=130 and HgA1c=6.1 ~  labs 1/09 showed BS= 137, HgA1c= 6.2... diet and exercise are the keys!!! ~  labs 9/09 showed BS= 119, HgA1c= 6.3 ~  labs 5/10 showed BS= 134, A1c= 6.3 ~  labs 2/11 showed BS= 122, A1c= 6.3 ~  labs 6/11 showed BS= 106 ~  Labs 2/12 showed BS= 138, A1c= 6.3 ~  Labs 3/13 on Metform500Bid showed  BS= 126, A1c= 6.0.Marland KitchenMarland Kitchen Continue same, get wt down! ~  7/13:  Metformin was held 7/13 Hosp w/ Creat= 0.9-1.2 & GFR= 40-50 range; we decided to restart METFORMIN 553m Qam only... ~  Labs 8/13 on Metform500 showed BS= 118 ~  Labs 10/13 in hosp showed BS=113, A1c=6.3 ~  2/14: on  Glimep171m off Metform due to mild RI; BS=85 & A1c=6.1; we decided to decr the Glimep1m80mo 1/2 tab/d. ~  4/14: she didn't decr the Glimep as prev discussed> asked to decr the Glimep1mg10m 1/2 tab Qam... ~  Labs 5/14 on Glim1mg-64m daily and BS=100 ~  6/15: DM> on Glimep1mg- 55ming 1/2 tab qam; last labs 3/15 showed BS=109, A1c=6.6; advised same med, better diet, continue wt loss efforts (down 8# to 180# today)..   HYMarland KitchenOTHYROID >>  ~  Routine labs 5/14 showed TSH= 15.48 & we started SYNTHROID 50mcg/81m ~  Follow up Labds 8/14 on Synthroid50 showed TSH= 2.80, continue same... ~  6/15: on Synthroid50 & last TSH was 3/15 = 1.82, stable, continue same...  OBESITY (ICD-278.01) - prev wt 227#  & _0  tall for a BMI=41-42...  ~  weight 1/10 = 217# ~  weight 5/10 = 215# ~  weight 9/10 = 210# ~  weight 2/11 = 210# ~  weight 6/11 = 203# ~  weight 10/11 = 203# ~  Weight 6/12 = 195# ~  Weight 3/13 = 192# ~  Weight 7/13 = 192# ~  Weight 8/13 = 188# ~  Weight 11/13 = 187# ~  Weight 2/14 = 188# ~  Weight 5/14 = 186# ~  Weight 8/14 = 188# ~  Weight 6/15 = 180#  GERD (ICD-530.81) - pt was placed on PROTONIX 40mgBid47m3 due to reflux symptoms and Anemia... ~  She is encouraged to f/u w/ GI & she indicates that she will call DrRehman when she is ready...  DIVERTICULOSIS, COLON (ICD-562.10) - had colonoscopy by DrDBrodie 1997 that was neg x divertics... now [place] in [place]...  IRRITABLE BOWEL SYNDROME (ICD-564.1)  RENAL INSUFFICIENCY >> Creat ~1.2 and GFR ~ 40-50 range...  DEGENERATIVE JOINT DISEASE, ADVANCED (ICD-715.90) - she has mod severe bilat knee pain and sees DrHarrison, Ortho for shots "every three months"... she is opposed to surgery (see his EMR notes- reviewed)...  VITAMIN D DEFICIENCY (ICD-268.9) ~  labs 5/10 showed Vit D level =13... therefore rec Vit D 50000 u weekly. ~  labs 2/11 showed Vit D level = 32... OK to change to VitD 1000 u daily.. ~  BMD done 6/11 showed  TScores -1.3 in Spine, and -2.5 in left FemNeck... rec> start Alendronate 70mg/wk 17mshe refuses Bisphos Rx "I'll take my chances"...  FIBROMYALGIA (ICD-729.1)  ANXIETY (ICD-300.00) - we prev perscribed HYDROXYZINE PAMOATE 25mg Prn 54m"throat closing" symptoms... ~  7/13:  She admits that "my nerves are shot" after recent [facility]osp [facility] 1/2 t83m tab Tid as needed... ~  2/14:  She stopped the Ativan on her own & now using just the ELAVIL 10mg & HYDR61mINE 25mg prn... 76mMIA (ICD-285.9) - hx mild anemia w/ Hg = 10.8 Jan09 & started Fe daily at that time...  ~  labs 5/09 showed Hg= 11.7, Fe= 64 ~  labs 9/09 showed Hg= 11.4, Fe= 43... rec- take FeSO4 w/ Vit C 500mg/d. ~  la71m/10 showed Hg= 11.3,  Fe= 50 ~  labs 2/11 showed Hg= 10.7, Fe= 36 (12%)... rec> incr Fe Vit C to Bid. ~  labs  6/11 showed Hg= 10.9, Fe= 58 (19%sat)... continue Bid Fe ~  Labs 2/12 showed Hg= 11.6, Fe= 51 (16%sat)... Continue same. ~  Labs 3/13 showed Hg= 11.3.Marland KitchenMarland Kitchen Continue FeSO4 Bid... ~  Labs during 7/13 Hosp showed Hg= 8-9 range... rec to continue FeSO4 Bid... ~  Labs 8/13 showed Hg= 10.7 and Fe= 45 (16%)... Ok to decr Fe to one daily... ~  Labs 10/13 in hosp showed Hg= 10.1 ~  2/14: on Fe, VitC, VitB12; Hg=11.1, Fe=79 (27%sat); continue supplements. ~  4/14: on Fe, VitC, VitB12; Hg= 9-10 range; continue supplements... ~  5/14: on Fe, VitC, VitB12, etc; Labs shows Hg= 11.8 & Fe= 42 (13%); Rec to continue Fe/ VitC etc ~  6/15: she remains on Fe, VitC, VitB12, etc...  INTERMITTENT VERTIGO (ICD-780.4) - prev eval DrWeymann w/ Meclizine Prn...   Past Surgical History  Procedure Laterality Date  . Cholecystectomy    . Tonsillectomy    . Hernia repair    . Cataract extraction    . Coronary angioplasty with stent placement  03/21/2012    Outpatient Encounter Prescriptions as of 11/15/2013  Medication Sig  . acetaminophen (TYLENOL) 500 MG tablet Take 1 tablet (500 mg total) by mouth every 6 (six) hours  as needed for pain.  [name] amitriptyline (ELAVIL) 10 MG tablet TAKE (1) TABLET BY MOUTH ONCE DAILY.  [name] ascorbic acid (VITAMIN C) 500 MG tablet Take 500 mg by mouth 2 (two) times daily. Twice daily with iron tablet  . aspirin EC 81 MG tablet Take 81 mg by mouth every morning.   . cholecalciferol (VITAMIN D) 1000 UNITS tablet Take 1,000 Units by mouth daily.  [name] econazole nitrate 1 % cream Apply 1 application topically daily as needed. FOR FEET IRRITATION  . ferrous sulfate 324 (65 FE) MG TBEC Take 1 tablet by mouth 2 (two) times daily.   . furosemide (LASIX) 40 MG tablet TAKE 1 TABLET ONCE DAILY.  [name] glimepiride (AMARYL) 1 MG tablet TAKE (1/2) TABLET BY MOUTH DAILY.  [name] glucose blood (ONE TOUCH TEST STRIPS) test strip Test once daily. DX;  250.00  . hydrOXYzine (ATARAX/VISTARIL) 25 MG tablet TAKE 1 TABLET EVERY 6 HOURS AS NEEDED FOR ITCHING OR ANXIETY.  [name] levothyroxine (SYNTHROID, LEVOTHROID) 50 MCG tablet TAKE 1 TABLET DAILY BEFORE BREAKFAST.  [name] meclizine (ANTIVERT) 25 MG tablet TAKE 1 TABLET 3 TIMES A DAY AS NEEDED FOR DIZZINESS.  . metoprolol (LOPRESSOR) 50 MG tablet Take 50 mg in the AM and 50 mg tablet in the PM  . ONE TOUCH LANCETS MISC Test once daily. Dx:  250.00  . pantoprazole (PROTONIX) 40 MG tablet TAKE (1) TABLET BY MOUTH ONCE DAILY.  . prasugrel (EFFIENT) 5 MG TABS tablet Take 1 tablet (5 mg total) by mouth daily.  . rosuvastatin (CRESTOR) 10 MG tablet Take 1 tablet (10 mg total) by mouth daily.  . vitamin B-12 (CYANOCOBALAMIN) 1000 MCG tablet Take 1,000 mcg by mouth every morning.  . nitroGLYCERIN (NITROSTAT) 0.4 MG SL tablet Place 1 tablet (0.4 mg total) under the tongue every 5 (five) minutes as needed for chest pain (up to 3 doses).    Allergies  Allergen Reactions  . Codeine Other (See Comments)    Reaction: severe GI pain  . Diazepam Other (See Comments)    REACTION: causes pt unable to wake up after taking  . Lipitor [Atorvastatin Calcium] Other (See Comments)    Reaction:  causes vertigo  . Penicillins Other (See Comments)    Reaction: unknown (childhood )  .  Plavix [Clopidogrel Bisulfate]     Nonresponder  . Sulfonamide Derivatives Other (See Comments)    Reaction: unknown (childhood)  . Mango Flavor Rash    Current Medications, Allergies, Past Medical History, Past Surgical History, Family History, and Social History were reviewed in [employer] record.   Review of Systems        See HPI - all other systems neg except as noted... The patient complains of decreased hearing, dyspnea on exertion, peripheral edema, muscle weakness, and difficulty walking.  The patient denies anorexia, fever, weight loss, weight gain, vision loss, hoarseness, chest pain, syncope, prolonged cough, headaches, hemoptysis, abdominal pain, melena, hematochezia, severe indigestion/heartburn, hematuria, incontinence, suspicious skin lesions, transient blindness, depression, unusual weight change, abnormal bleeding, enlarged lymph nodes, and angioedema.   Objective:   Physical Exam      WD, Obese, 80 y/o WF in NAD... GENERAL:  Alert & oriented; pleasant & cooperative... she is very talkative... HEENT:  [place]/AT, Glasses, EOM-full, PERRLA, TMs-wax, NOSE-clear, THROAT-clear & wnl. NECK:  Supple w/ fairROM; no JVD; no carotid bruits; no thyromegaly or nodules palpated; no lymphadenopathy. CHEST:  decr BS bilat but clear w/o wheezing, rales or signs of consolidation... HEART:   sl irregular rhythm; without murmurs/ rubs/ or gallops detected... ABDOMEN:  Obese w/ panniculus, soft & nontender; normal bowel sounds; no organomegaly or masses palpated. EXT:  mod arthritic changes; no varicose veins/ +venous insuffic/ tr+edema bilat NEURO:  CN's intact, no focal neuro deficits... DERM:  few ecchymoses, no rash etc...  RADIOLOGY DATA:  Reviewed in the EPIC EMR & discussed w/ the patient...  LABORATORY DATA:  Reviewed in the EPIC EMR & discussed w/ the  patient...   Assessment & Plan:    AB>  Breathing is stable w/o exac, on continuous O2...  Hx Sarcoid>  CXR w/ chr changes, NAD...  CAD>  on ASA81, Effient5 & off Plavix; [place] 10/13, 3/14, 12/14 as noted- w/ NSTEMI & PTCA/ DES to LAD- she has refused CABG; also had AFib & they decided on rate control; she had f/u [facility] 4/14- continue duel antiplat Rx w/ ASA & Effient, meds adjusted. CWP>  We discussed rest, heat, Tramadol prn...  HBP>  Controlled on meds, continue same, take regularly...  Palpit, AFib>  On going eval & med adjustments from [facility]; better on the incr Metoprolol but notes that the Vistaril really helps too...  PVD>  On ASA & Effient now w/o cerebral ischemic symptoms; CDopp[place] 4/13 w/ sl progression- continue meds...  Ven Insuffic/ Edema>  We reviewed the nec sodium restriction and continue the Lasix40...  HYPERLIPID>  On Cres10 & due for f/u FLP on ret...  DM>  On Glimep27m- 1/2 daily & we will continue same for now...  Hypothyroid>  TSH looks good on Synthroid50- continue same...  GI>  GERD, Divertics, IBS>  Stable, continue Protonix40Bid...  DJD>  [place]  Anxiety>  Off Lorazepam, on Elavil & Atarax...   Patient's Medications  New Prescriptions   No medications on file  Previous Medications   ACETAMINOPHEN (TYLENOL) 500 MG TABLET    Take 1 tablet (500 mg total) by mouth every 6 (six) hours as needed for pain.   AMITRIPTYLINE (ELAVIL) 10 MG TABLET    TAKE (1) TABLET BY MOUTH ONCE DAILY.   ASCORBIC ACID (VITAMIN C) 500 MG TABLET    Take 500 mg by mouth 2 (two) times daily. Twice daily with iron tablet   ASPIRIN EC 81 MG TABLET  Take 81 mg by mouth every morning.    CHOLECALCIFEROL (VITAMIN D) 1000 UNITS TABLET    Take 1,000 Units by mouth daily.   ECONAZOLE NITRATE 1 % CREAM    Apply 1 application topically daily as needed. FOR FEET IRRITATION   FERROUS SULFATE 324 (65 FE) MG TBEC    Take 1 tablet by mouth 2 (two) times  daily.    FUROSEMIDE (LASIX) 40 MG TABLET    TAKE 1 TABLET ONCE DAILY.   GLUCOSE BLOOD (ONE TOUCH TEST STRIPS) TEST STRIP    Test once daily. DX;  250.00   HYDROXYZINE (ATARAX/VISTARIL) 25 MG TABLET    TAKE 1 TABLET EVERY 6 HOURS AS NEEDED FOR ITCHING OR ANXIETY.   LEVOTHYROXINE (SYNTHROID, LEVOTHROID) 50 MCG TABLET    TAKE 1 TABLET DAILY BEFORE BREAKFAST.   MECLIZINE (ANTIVERT) 25 MG TABLET    TAKE 1 TABLET 3 TIMES A DAY AS NEEDED FOR DIZZINESS.   METOPROLOL (LOPRESSOR) 50 MG TABLET    Take 50 mg in the AM and 50 mg tablet in the PM   ONE TOUCH LANCETS MISC    Test once daily. Dx:  250.00   PANTOPRAZOLE (PROTONIX) 40 MG TABLET    TAKE (1) TABLET BY MOUTH ONCE DAILY.   PRASUGREL (EFFIENT) 5 MG TABS TABLET    Take 1 tablet (5 mg total) by mouth daily.   ROSUVASTATIN (CRESTOR) 10 MG TABLET    Take 1 tablet (10 mg total) by mouth daily.   VITAMIN B-12 (CYANOCOBALAMIN) 1000 MCG TABLET    Take 1,000 mcg by mouth every morning.  Modified Medications   Modified Medication Previous Medication   GLIMEPIRIDE (AMARYL) 1 MG TABLET glimepiride (AMARYL) 1 MG tablet      TAKE 1/2 TABLET DAILY.    TAKE (1/2) TABLET BY MOUTH DAILY.   NITROGLYCERIN (NITROSTAT) 0.4 MG SL TABLET nitroGLYCERIN (NITROSTAT) 0.4 MG SL tablet      Place 1 tablet (0.4 mg total) under the tongue every 5 (five) minutes as needed for chest pain (up to 3 doses).    Place 1 tablet (0.4 mg total) under the tongue every 5 (five) minutes as needed for chest pain (up to 3 doses).  Discontinued Medications   No medications on file

## 2013-11-15 NOTE — Patient Instructions (Signed)
Today we updated your med list in our EPIC system...    Continue your current medications the same...  Remember to avoid sodium/ salt!!!  Call for any questions...  Let's plan a follow up visit in 82mo, sooner if needed for problems.[name]..[name]

## 2013-11-22 ENCOUNTER — Other Ambulatory Visit: Payer: Self-pay | Admitting: Pulmonary Disease

## 2013-11-27 ENCOUNTER — Telehealth: Payer: Self-pay | Admitting: *Deleted

## 2013-11-27 MED ORDER — NITROGLYCERIN 0.4 MG SL SUBL: 0.4000 mg | SUBLINGUAL_TABLET | SUBLINGUAL | Status: AC | PRN

## 2013-11-27 NOTE — Telephone Encounter (Signed)
Medication sent to pharmacy  

## 2013-11-27 NOTE — Telephone Encounter (Signed)
Needs refill on Nitrostat sent to [facility] [place]. / tgs

## 2013-12-19 ENCOUNTER — Ambulatory Visit: Payer: Medicare Other | Admitting: Orthopedic Surgery

## 2014-01-04 ENCOUNTER — Other Ambulatory Visit: Payer: Self-pay | Admitting: Pulmonary Disease

## 2014-01-10 ENCOUNTER — Encounter: Payer: Self-pay | Admitting: Cardiovascular Disease

## 2014-01-10 ENCOUNTER — Ambulatory Visit (INDEPENDENT_AMBULATORY_CARE_PROVIDER_SITE_OTHER): Payer: Medicare Other | Admitting: Cardiovascular Disease

## 2014-01-10 VITALS — BP 146/70 | HR 60 | Ht 66.0 in | Wt 180.0 lb

## 2014-01-10 DIAGNOSIS — I10 Essential (primary) hypertension: Secondary | ICD-10-CM

## 2014-01-10 DIAGNOSIS — R5381 Other malaise: Secondary | ICD-10-CM

## 2014-01-10 DIAGNOSIS — R5383 Other fatigue: Secondary | ICD-10-CM

## 2014-01-10 DIAGNOSIS — I4891 Unspecified atrial fibrillation: Secondary | ICD-10-CM

## 2014-01-10 DIAGNOSIS — I5032 Chronic diastolic (congestive) heart failure: Secondary | ICD-10-CM

## 2014-01-10 DIAGNOSIS — I7090 Unspecified atherosclerosis: Secondary | ICD-10-CM

## 2014-01-10 DIAGNOSIS — I2510 Atherosclerotic heart disease of native coronary artery without angina pectoris: Secondary | ICD-10-CM

## 2014-01-10 DIAGNOSIS — E785 Hyperlipidemia, unspecified: Secondary | ICD-10-CM

## 2014-01-10 MED ORDER — METOPROLOL TARTRATE 25 MG PO TABS: 25.0000 mg | ORAL_TABLET | Freq: Two times a day (BID) | ORAL | Status: DC

## 2014-01-10 NOTE — Progress Notes (Signed)
Patient ID: Alexandra [name], female   DOB: 01-18-1924, 80 y.o.   MRN: [id number]      SUBJECTIVE: Alexandra [name] is an 80 year old woman with CAD, status post hospitalization in March of 2014 with a non-ST elevation MI. She was found to have severe stenosis in the proximal LAD stent and complex CAD with moderately severe disease of the RCA and circumflex. She was offered CABG but refused. Per Dr. Clifton James, attempted PCI of the RCA or circumflex should only be done if she had recurrent symptoms. He felt that PCI would be very difficult given the degree of calcium and tortuosity. She was continued on DAPT (Plavix nonresponder, on Effient). She was not placed on Coumadin or Xarelto for atrial fibrillation in the setting of dual antiplatelet therapy.  Echocardiography on 05-01-2013 revealed hyperdynamic LV systolic function, EF 65-70%, with a mild intracavitary gradient, mild aortic stenosis, mild to moderate mitral stenosis, and severe pulmonary hypertension.  She was hospitalized in 05/2013 for acute diastolic heart failure.   At a prior visit, I started isosorbide dinitrate but this made her anxious and nervous. Thus, it was discontinued.  She denies cardiac chest pain and shortness of breath. She uses 2 L of oxygen at all times.  However, she has been feeling more fatigued in the past two weeks.  She was diagnosed with arthritis of the manubriosternal joints. She has pain related to this which is only relieved with Tylenol. She has tried SL nitro for this in the past but it didn't helped. She occasionally takes extra Lasix for leg swelling but hasn't taken any today.  She will be 49 tomorrow!   Review of Systems: As per "subjective", otherwise negative.  Allergies  Allergen Reactions  . Codeine Other (See Comments)    Reaction: severe GI pain  . Diazepam Other (See Comments)    REACTION: causes pt unable to wake up after taking  . Lipitor [Atorvastatin Calcium] Other (See Comments)    Reaction:  causes vertigo  . Penicillins Other (See Comments)    Reaction: unknown (childhood )  . Plavix [Clopidogrel Bisulfate]     Nonresponder  . Sulfonamide Derivatives Other (See Comments)    Reaction: unknown (childhood)  . Mango Flavor Rash    Current Outpatient Prescriptions  Medication Sig Dispense Refill  . acetaminophen (TYLENOL) 500 MG tablet Take 1 tablet (500 mg total) by mouth every 6 (six) hours as needed for pain.      [name] amitriptyline (ELAVIL) 10 MG tablet TAKE 1 TABLET ONCE DAILY.  30 tablet  5  . ascorbic acid (VITAMIN C) 500 MG tablet Take 500 mg by mouth 2 (two) times daily. Twice daily with iron tablet      . aspirin EC 81 MG tablet Take 81 mg by mouth every morning.       . cholecalciferol (VITAMIN D) 1000 UNITS tablet Take 1,000 Units by mouth daily.      [name] econazole nitrate 1 % cream Apply 1 application topically daily as needed. FOR FEET IRRITATION      . ferrous sulfate 324 (65 FE) MG TBEC Take 1 tablet by mouth 2 (two) times daily.       . furosemide (LASIX) 40 MG tablet TAKE 1 TABLET ONCE DAILY.  30 tablet  2  . glimepiride (AMARYL) 1 MG tablet TAKE 1/2 TABLET DAILY.  15 tablet  6  . glucose blood (ONE TOUCH TEST STRIPS) test strip Test once daily. DX;  250.00  50 each  6  .  hydrOXYzine (ATARAX/VISTARIL) 25 MG tablet TAKE 1 TABLET EVERY 6 HOURS AS NEEDED FOR ITCHING OR ANXIETY.  30 tablet  5  . levothyroxine (SYNTHROID, LEVOTHROID) 50 MCG tablet TAKE 1 TABLET DAILY BEFORE BREAKFAST.  30 tablet  5  . meclizine (ANTIVERT) 25 MG tablet TAKE 1 TABLET 3 TIMES A DAY AS NEEDED FOR DIZZINESS.  50 tablet  1  . metoprolol (LOPRESSOR) 50 MG tablet Take 50 mg in the AM and 50 mg tablet in the PM      . nitroGLYCERIN (NITROSTAT) 0.4 MG SL tablet Place 1 tablet (0.4 mg total) under the tongue every 5 (five) minutes as needed for chest pain (up to 3 doses).  25 tablet  4  . ONE TOUCH LANCETS MISC Test once daily. Dx:  250.00  200 each  6  . pantoprazole (PROTONIX) 40 MG tablet TAKE  (1) TABLET BY MOUTH ONCE DAILY.      . prasugrel (EFFIENT) 5 MG TABS tablet Take 1 tablet (5 mg total) by mouth daily.  90 tablet  3  . rosuvastatin (CRESTOR) 10 MG tablet Take 1 tablet (10 mg total) by mouth daily.  90 tablet  3  . vitamin B-12 (CYANOCOBALAMIN) 1000 MCG tablet Take 1,000 mcg by mouth every morning.       No current facility-administered medications for this visit.    Past Medical History  Diagnosis Date  . Asthmatic bronchitis   . Sarcoidosis     a. 1990s of lung tx with prednisone temporarily.  [name] HTN (hypertension)   . PVD (peripheral vascular disease)     a. 06/2012 Carotid U/S: < 50% bilat ICA stenosis.  . Hyperlipemia   . DM (diabetes mellitus)   . Obesity   . GERD (gastroesophageal reflux disease)   . Diverticulosis of colon   . IBS (irritable bowel syndrome)   . DJD (degenerative joint disease)     Hip and knee pain; hip bursitis; patellar subluxation  . Vitamin D deficiency disease   . Fibromyalgia   . Anxiety     a. Guaiac neg 03/2012.  . Intermittent vertigo   . Anemia   . Venous insufficiency   . PSVT (paroxysmal supraventricular tachycardia)     a. Prior hx of palpitations - developed SVT during stress Myoview 09/2011. b. Noted on tele 03/2012.  . Valvular heart disease     a. moderately thickened/mildly calcified aortic valve, mod MR 03/20/12  . CKD (chronic kidney disease), stage II   . PAC (premature atrial contraction)   . CAD (coronary artery disease) 03/2011    a. NSTEMI 03/2011 complicated by pulm edema s/p DES to subtotal LAD. b. [place] s/p PTCA/cutting balloon to prox LAD for ISR 12/01/11 (med rx for RCA/Cx dz).  c. NSTEMI 03/2012 s/p cutting angioplasty of ISR of prox LAD 03/21/12 (PLAVIX NONRESPONDER). d. DES to LAD for ISR 07/2012 (pt refused consideration of CABG).  [name] A-fib     a. Noted 07/2012 - not on anticoag due for need for DAPT and age.  . Thrombocytopenia     a. Noted 07/2012.  . On home oxygen therapy     2L/min with ambulation as of  07/2012.    Past Surgical History  Procedure Laterality Date  . Cholecystectomy    . Tonsillectomy    . Hernia repair    . Cataract extraction    . Coronary angioplasty with stent placement  03/21/2012    History   Social History  . Marital Status: Widowed  Spouse Name: N/A    Number of Children: 1  . Years of Education: N/A   Occupational History  . Not on file.   Social History Main Topics  . Smoking status: Never Smoker   . Smokeless tobacco: Never Used  . Alcohol Use: No     Comment: social  . Drug Use: No  . Sexual Activity: No   Other Topics Concern  . Not on file   Social History Narrative   Pt is only child. Husband Duanne GuessDewey passed away in 2004   Pt has 1 son in [place](his wife is MD doing [employer] Research )     BP 146/70 Pulse 60    PHYSICAL EXAM General: NAD  Neck: No JVD, no thyromegaly or thyroid nodule.  Lungs: Clear to auscultation bilaterally with normal respiratory effort.  CV: Nondisplaced PMI. Heart regular S1/S2, no S3/S4, II/VI holosystolic murmur. 1+ left leg pretibial edema. No carotid bruit. Normal pedal pulses.  Abdomen: Soft, nontender, no hepatosplenomegaly, no distention.  Neurologic: Alert and oriented x 3.  Psych: Normal affect.  Extremities: No clubbing or cyanosis.   ECG: reviewed and available in electronic records.      ASSESSMENT AND PLAN: 1. CAD with fatigue: Remains on ASA, Crestor, Effient, and metoprolol. She is more fatigued lately and this is likely a function of her severe CAD. Resting HR is 60 bpm. I will reduce metoprolol to see if this helps her symptoms. I will also obtain Dr. Gibson RampMcAlhany's opinion about coronary angiography/PCI, understanding that the risk is high.  2. Atrial fibrillation: Currently in a regular rhythm. Will reduce metoprolol to 25 mg bid. No anticoagulation due to being on dual antiplatelet therapy.   3. Hyperlipidemia: On Crestor.   4. Essential HTN: Reasonably controlled for age on current therapy.    5. Chronic diastolic heart failure: On Lasix 40 mg daily and did not tolerate isosorbide dinitrate.   Dispo: f/u 2 months.  Prentice DockerSuresh Koneswaran, M.D., F.A.C.C.

## 2014-01-10 NOTE — Patient Instructions (Addendum)
Your physician recommends that you schedule a follow-up appointment in:2 months    Your physician has recommended you make the following change in your medication:     DECREASE Lopressor to 25 mg twice a day     Thank you for choosing [facility] !

## 2014-01-31 ENCOUNTER — Other Ambulatory Visit: Payer: Self-pay | Admitting: Cardiovascular Disease

## 2014-02-12 ENCOUNTER — Other Ambulatory Visit: Payer: Self-pay | Admitting: Cardiovascular Disease

## 2014-02-12 NOTE — Telephone Encounter (Signed)
Medication sent to pharmacy  

## 2014-02-13 ENCOUNTER — Ambulatory Visit (INDEPENDENT_AMBULATORY_CARE_PROVIDER_SITE_OTHER): Payer: Medicare Other | Admitting: Orthopedic Surgery

## 2014-02-13 VITALS — BP 110/63 | Ht 66.0 in | Wt 180.0 lb

## 2014-02-13 DIAGNOSIS — IMO0002 Reserved for concepts with insufficient information to code with codable children: Secondary | ICD-10-CM

## 2014-02-13 DIAGNOSIS — M1710 Unilateral primary osteoarthritis, unspecified knee: Secondary | ICD-10-CM

## 2014-02-13 NOTE — Progress Notes (Signed)
Chief Complaint  Patient presents with  . Injections    3 months bilateral knee injections    Procedure note left knee injection verbal consent was obtained to inject left knee joint  Timeout was completed to confirm the site of injection  The medications used were 40 mg of Depo-Medrol and 1% lidocaine 3 cc  Anesthesia was provided by ethyl chloride and the skin was prepped with alcohol.  After cleaning the skin with alcohol a 20-gauge needle was used to inject the left knee joint. There were no complications. A sterile bandage was applied.  Procedure note right knee injection verbal consent was obtained to inject right knee joint  Timeout was completed to confirm the site of injection  The medications used were 40 mg of Depo-Medrol and 1% lidocaine 3 cc  Anesthesia was provided by ethyl chloride and the skin was prepped with alcohol.  After cleaning the skin with alcohol a 20-gauge needle was used to inject the right knee joint. There were no complications. A sterile bandage was applied.

## 2014-02-23 ENCOUNTER — Other Ambulatory Visit: Payer: Self-pay | Admitting: Cardiovascular Disease

## 2014-03-20 ENCOUNTER — Ambulatory Visit (INDEPENDENT_AMBULATORY_CARE_PROVIDER_SITE_OTHER): Payer: Medicare Other | Admitting: Cardiovascular Disease

## 2014-03-20 ENCOUNTER — Encounter: Payer: Self-pay | Admitting: Cardiovascular Disease

## 2014-03-20 VITALS — BP 138/82 | HR 87 | Ht 66.0 in | Wt 174.0 lb

## 2014-03-20 DIAGNOSIS — N183 Chronic kidney disease, stage 3 unspecified: Secondary | ICD-10-CM

## 2014-03-20 DIAGNOSIS — I4891 Unspecified atrial fibrillation: Secondary | ICD-10-CM

## 2014-03-20 DIAGNOSIS — I38 Endocarditis, valve unspecified: Secondary | ICD-10-CM

## 2014-03-20 DIAGNOSIS — E785 Hyperlipidemia, unspecified: Secondary | ICD-10-CM

## 2014-03-20 DIAGNOSIS — R5383 Other fatigue: Secondary | ICD-10-CM

## 2014-03-20 DIAGNOSIS — Z23 Encounter for immunization: Secondary | ICD-10-CM

## 2014-03-20 DIAGNOSIS — I2510 Atherosclerotic heart disease of native coronary artery without angina pectoris: Secondary | ICD-10-CM

## 2014-03-20 DIAGNOSIS — I10 Essential (primary) hypertension: Secondary | ICD-10-CM

## 2014-03-20 DIAGNOSIS — I5032 Chronic diastolic (congestive) heart failure: Secondary | ICD-10-CM

## 2014-03-20 NOTE — Progress Notes (Signed)
Patient ID: Alexandra SermonJudith T [name], female   DOB: Jan 02, 1924, 80 y.o.   MRN: [id number]      SUBJECTIVE: Alexandra [name] is an 80 year old woman with CAD, status post hospitalization in March of 2014 with a non-ST elevation MI. She was found to have severe stenosis in the proximal LAD stent and complex CAD with moderately severe disease of the RCA and circumflex. She was offered CABG but refused. Per Dr. Clifton JamesMcAlhany, attempted PCI of the RCA or circumflex should only be done if she had recurrent symptoms. He felt that PCI would be very difficult given the degree of calcium and tortuosity. She was continued on DAPT (Plavix nonresponder, on Effient). She was not placed on Coumadin or Xarelto for atrial fibrillation in the setting of dual antiplatelet therapy.  Echocardiography on 05-01-2013 revealed hyperdynamic LV systolic function, EF 65-70%, with a mild intracavitary gradient, mild aortic stenosis, mild to moderate mitral stenosis, and severe pulmonary hypertension.  She was hospitalized in 05/2013 for acute diastolic heart failure.  At a prior visit, I started isosorbide dinitrate but this made her anxious and nervous. Thus, it was discontinued.   She denies cardiac chest pain and shortness of breath. She uses 2 L of oxygen at all times.  She also has arthritis of the manubriosternal joints. She has pain related to this which is only relieved with Tylenol. She has tried SL nitro for this in the past but it did not help. She occasionally takes extra Lasix for leg swelling.  She did not take her medications this morning because she did not eat breakfast. She remains fatigued.    Review of Systems: As per "subjective", otherwise negative.  Allergies  Allergen Reactions  . Codeine Other (See Comments)    Reaction: severe GI pain  . Diazepam Other (See Comments)    REACTION: causes pt unable to wake up after taking  . Lipitor [Atorvastatin Calcium] Other (See Comments)    Reaction: causes vertigo  .  Penicillins Other (See Comments)    Reaction: unknown (childhood )  . Plavix [Clopidogrel Bisulfate]     Nonresponder  . Sulfonamide Derivatives Other (See Comments)    Reaction: unknown (childhood)  . Mango Flavor Rash    Current Outpatient Prescriptions  Medication Sig Dispense Refill  . acetaminophen (TYLENOL) 500 MG tablet Take 1 tablet (500 mg total) by mouth every 6 (six) hours as needed for pain.      [name]. amitriptyline (ELAVIL) 10 MG tablet TAKE 1 TABLET ONCE DAILY.  30 tablet  5  . ascorbic acid (VITAMIN C) 500 MG tablet Take 500 mg by mouth 2 (two) times daily. Twice daily with iron tablet      . aspirin EC 81 MG tablet Take 81 mg by mouth every morning.       . cholecalciferol (VITAMIN D) 1000 UNITS tablet Take 1,000 Units by mouth daily.      [name]. econazole nitrate 1 % cream Apply 1 application topically daily as needed. FOR FEET IRRITATION      . ferrous sulfate 324 (65 FE) MG TBEC Take 1 tablet by mouth 2 (two) times daily.       . furosemide (LASIX) 40 MG tablet TAKE 1 TABLET ONCE DAILY.  30 tablet  6  . glimepiride (AMARYL) 1 MG tablet TAKE 1/2 TABLET DAILY.  15 tablet  6  . glucose blood (ONE TOUCH TEST STRIPS) test strip Test once daily. DX;  250.00  50 each  6  . hydrOXYzine (ATARAX/VISTARIL) 25 MG  tablet TAKE 1 TABLET EVERY 6 HOURS AS NEEDED FOR ITCHING OR ANXIETY.  30 tablet  5  . levothyroxine (SYNTHROID, LEVOTHROID) 50 MCG tablet TAKE 1 TABLET DAILY BEFORE BREAKFAST.  30 tablet  5  . meclizine (ANTIVERT) 25 MG tablet TAKE 1 TABLET 3 TIMES A DAY AS NEEDED FOR DIZZINESS.  50 tablet  1  . metoprolol tartrate (LOPRESSOR) 25 MG tablet Take 1 tablet (25 mg total) by mouth 2 (two) times daily.  180 tablet  3  . nitroGLYCERIN (NITROSTAT) 0.4 MG SL tablet Place 1 tablet (0.4 mg total) under the tongue every 5 (five) minutes as needed for chest pain (up to 3 doses).  25 tablet  4  . ONE TOUCH LANCETS MISC Test once daily. Dx:  250.00  200 each  6  . pantoprazole (PROTONIX) 40 MG  tablet TAKE (1) TABLET BY MOUTH ONCE DAILY.      . pantoprazole (PROTONIX) 40 MG tablet TAKE (1) TABLET TWICE DAILY.  60 tablet  3  . prasugrel (EFFIENT) 5 MG TABS tablet Take 1 tablet (5 mg total) by mouth daily.  90 tablet  3  . rosuvastatin (CRESTOR) 10 MG tablet Take 1 tablet (10 mg total) by mouth daily.  90 tablet  3  . vitamin B-12 (CYANOCOBALAMIN) 1000 MCG tablet Take 1,000 mcg by mouth every morning.       No current facility-administered medications for this visit.    Past Medical History  Diagnosis Date  . Asthmatic bronchitis   . Sarcoidosis     a. 1990s of lung tx with prednisone temporarily.  [name] HTN (hypertension)   . PVD (peripheral vascular disease)     a. 06/2012 Carotid U/S: < 50% bilat ICA stenosis.  . Hyperlipemia   . DM (diabetes mellitus)   . Obesity   . GERD (gastroesophageal reflux disease)   . Diverticulosis of colon   . IBS (irritable bowel syndrome)   . DJD (degenerative joint disease)     Hip and knee pain; hip bursitis; patellar subluxation  . Vitamin D deficiency disease   . Fibromyalgia   . Anxiety     a. Guaiac neg 03/2012.  . Intermittent vertigo   . Anemia   . Venous insufficiency   . PSVT (paroxysmal supraventricular tachycardia)     a. Prior hx of palpitations - developed SVT during stress Myoview 09/2011. b. Noted on tele 03/2012.  . Valvular heart disease     a. moderately thickened/mildly calcified aortic valve, mod MR 03/20/12  . CKD (chronic kidney disease), stage II   . PAC (premature atrial contraction)   . CAD (coronary artery disease) 03/2011    a. NSTEMI 03/2011 complicated by pulm edema s/p DES to subtotal LAD. b. [place] s/p PTCA/cutting balloon to prox LAD for ISR 12/01/11 (med rx for RCA/Cx dz).  c. NSTEMI 03/2012 s/p cutting angioplasty of ISR of prox LAD 03/21/12 (PLAVIX NONRESPONDER). d. DES to LAD for ISR 07/2012 (pt refused consideration of CABG).  [name] A-fib     a. Noted 07/2012 - not on anticoag due for need for DAPT and age.  .  Thrombocytopenia     a. Noted 07/2012.  . On home oxygen therapy     2L/min with ambulation as of 07/2012.    Past Surgical History  Procedure Laterality Date  . Cholecystectomy    . Tonsillectomy    . Hernia repair    . Cataract extraction    . Coronary angioplasty with stent placement  03/21/2012  History   Social History  . Marital Status: Widowed    Spouse Name: N/A    Number of Children: 1  . Years of Education: N/A   Occupational History  . Not on file.   Social History Main Topics  . Smoking status: Never Smoker   . Smokeless tobacco: Never Used  . Alcohol Use: No     Comment: social  . Drug Use: No  . Sexual Activity: No   Other Topics Concern  . Not on file   Social History Narrative   Pt is only child. Husband Duanne GuessDewey passed away in 2004   Pt has 1 son in [place](his wife is MD doing [employer] )     BP 138/82  Pulse 87  SpO2 93% Weight 174 lb (78.926 kg) Height 5\' 6"  (1.676 m)    PHYSICAL EXAM General: NAD  Neck: No JVD, no thyromegaly or thyroid nodule.  Lungs: Clear to auscultation bilaterally with normal respiratory effort.  CV: Nondisplaced PMI. Irregular rhythm, normal S1/S2, no S3, II/VI holosystolic murmur along left sternal border. Trace left leg pretibial edema.  Abdomen: Soft, nontender, no hepatosplenomegaly, no distention.  Neurologic: Alert and oriented x 3.  Psych: Normal affect.  Skin: Normal. Musculoskeletal: Normal range of motion, no gross deformities. Extremities: No clubbing or cyanosis.   ECG: Most recent ECG reviewed.   ASSESSMENT AND PLAN: 1. CAD: Remains on ASA, Crestor, Effient, and metoprolol. I previously obtained Dr. Gibson RampMcAlhany's opinion about coronary angiography/PCI, understanding that the risk is high. He thought medical management was preferred, but would consider high-risk PCI if deemed necessary. 2. Atrial fibrillation: Rate is controlled on metoprolol 25 mg bid. No anticoagulation due to being on dual  antiplatelet therapy.  3. Hyperlipidemia: On Crestor. No changes. 4. Essential HTN: Well controlled for age on current therapy. No changes. 5. Chronic diastolic heart failure: On Lasix 40 mg daily and did not tolerate isosorbide dinitrate. Euvolemic, thus no changes to therapy. 6. Valvular heart disease with both aortic and mitral stenosis: No signs of heart failure at present. No changes to therapy.  Dispo: f/u 4 months.  Prentice DockerSuresh Koneswaran, M.D., F.A.C.C.

## 2014-03-20 NOTE — Patient Instructions (Signed)
Continue all current medications. Your physician wants you to follow up in:  4 months.  You will receive a reminder letter in the mail one-two months in advance.  If you don't receive a letter, please call our office to schedule the follow up appointment   

## 2014-03-22 ENCOUNTER — Other Ambulatory Visit: Payer: Self-pay | Admitting: Cardiovascular Disease

## 2014-04-06 ENCOUNTER — Other Ambulatory Visit: Payer: Self-pay | Admitting: Pulmonary Disease

## 2014-04-06 ENCOUNTER — Telehealth: Payer: Self-pay | Admitting: Pulmonary Disease

## 2014-04-06 MED ORDER — ONETOUCH LANCETS MISC: Status: DC

## 2014-04-06 MED ORDER — ONETOUCH LANCETS MISC: Status: AC

## 2014-04-06 MED ORDER — GLUCOSE BLOOD VI STRP: ORAL_STRIP | Status: AC

## 2014-04-06 MED ORDER — GLUCOSE BLOOD VI STRP: ORAL_STRIP | Status: DC

## 2014-04-06 NOTE — Telephone Encounter (Signed)
Called and spoke with pt and she is aware that these have been sent back to the pharmacy and nothing further is needed.

## 2014-04-06 NOTE — Telephone Encounter (Signed)
Called and spoke with pharmacy and they did take the VO for this rx for the lancets and the test strips.  They will get these filled and delivered to the pt.

## 2014-04-12 ENCOUNTER — Other Ambulatory Visit: Payer: Self-pay | Admitting: Cardiovascular Disease

## 2014-04-12 ENCOUNTER — Other Ambulatory Visit: Payer: Self-pay | Admitting: Pulmonary Disease

## 2014-04-17 ENCOUNTER — Ambulatory Visit (INDEPENDENT_AMBULATORY_CARE_PROVIDER_SITE_OTHER): Payer: Medicare Other | Admitting: Orthopedic Surgery

## 2014-04-17 ENCOUNTER — Encounter: Payer: Self-pay | Admitting: Orthopedic Surgery

## 2014-04-17 VITALS — BP 131/70 | Ht 66.0 in | Wt 174.0 lb

## 2014-04-17 DIAGNOSIS — M170 Bilateral primary osteoarthritis of knee: Secondary | ICD-10-CM

## 2014-04-17 NOTE — Progress Notes (Signed)
Patient ID: Traci SermonJudith T [name], female   DOB: [birth year], 80 y.o.   MRN: [id number]  Chief Complaint  Patient presents with  . Follow-up    2 month recheck, repeat injections bilateral knees    Procedure note right knee injection verbal consent was obtained to inject right knee joint  Timeout was completed to confirm the site of injection  The medications used were 40 mg of Depo-Medrol and 1% lidocaine 3 cc  Anesthesia was provided by ethyl chloride and the skin was prepped with alcohol.  After cleaning the skin with alcohol a 20-gauge needle was used to inject the right knee joint. There were no complications. A sterile bandage was applied.   Procedure note left knee injection verbal consent was obtained to inject left knee joint  Timeout was completed to confirm the site of injection  The medications used were 40 mg of Depo-Medrol and 1% lidocaine 3 cc  Anesthesia was provided by ethyl chloride and the skin was prepped with alcohol.  After cleaning the skin with alcohol a 20-gauge needle was used to inject the left knee joint. There were no complications. A sterile bandage was applied.

## 2014-05-04 ENCOUNTER — Other Ambulatory Visit: Payer: Self-pay | Admitting: Pulmonary Disease

## 2014-05-09 ENCOUNTER — Ambulatory Visit: Payer: Medicare Other | Admitting: Pulmonary Disease

## 2014-05-10 ENCOUNTER — Encounter (HOSPITAL_COMMUNITY): Payer: Self-pay | Admitting: Cardiology

## 2014-05-10 ENCOUNTER — Other Ambulatory Visit: Payer: Self-pay | Admitting: Cardiovascular Disease

## 2014-05-14 ENCOUNTER — Encounter: Payer: Self-pay | Admitting: Pulmonary Disease

## 2014-05-17 ENCOUNTER — Telehealth: Payer: Self-pay | Admitting: *Deleted

## 2014-05-17 NOTE — Telephone Encounter (Signed)
Patient called back. Asked that she be told today if she can take musinex or not

## 2014-05-17 NOTE — Telephone Encounter (Signed)
Patient informed that mucinex 600 mg twice daily is okay for her to use for her sinus problem and doesn't interact with her medications.

## 2014-05-17 NOTE — Telephone Encounter (Signed)
Message left on voice mail - requesting return call.  Attempted to return call - fast busy signal.

## 2014-05-18 IMAGING — CR DG CHEST 1V PORT
1 series · 1 of 1 positions shown · non-contrast
Comparison: Portable exam 0247 hr compared to 04/29/2013

CLINICAL DATA: Hypoxia, history diabetes, coronary artery disease,
atrial fibrillation, asthma, sarcoidosis, hypertension

EXAM:
PORTABLE CHEST - 1 VIEW

[portable]
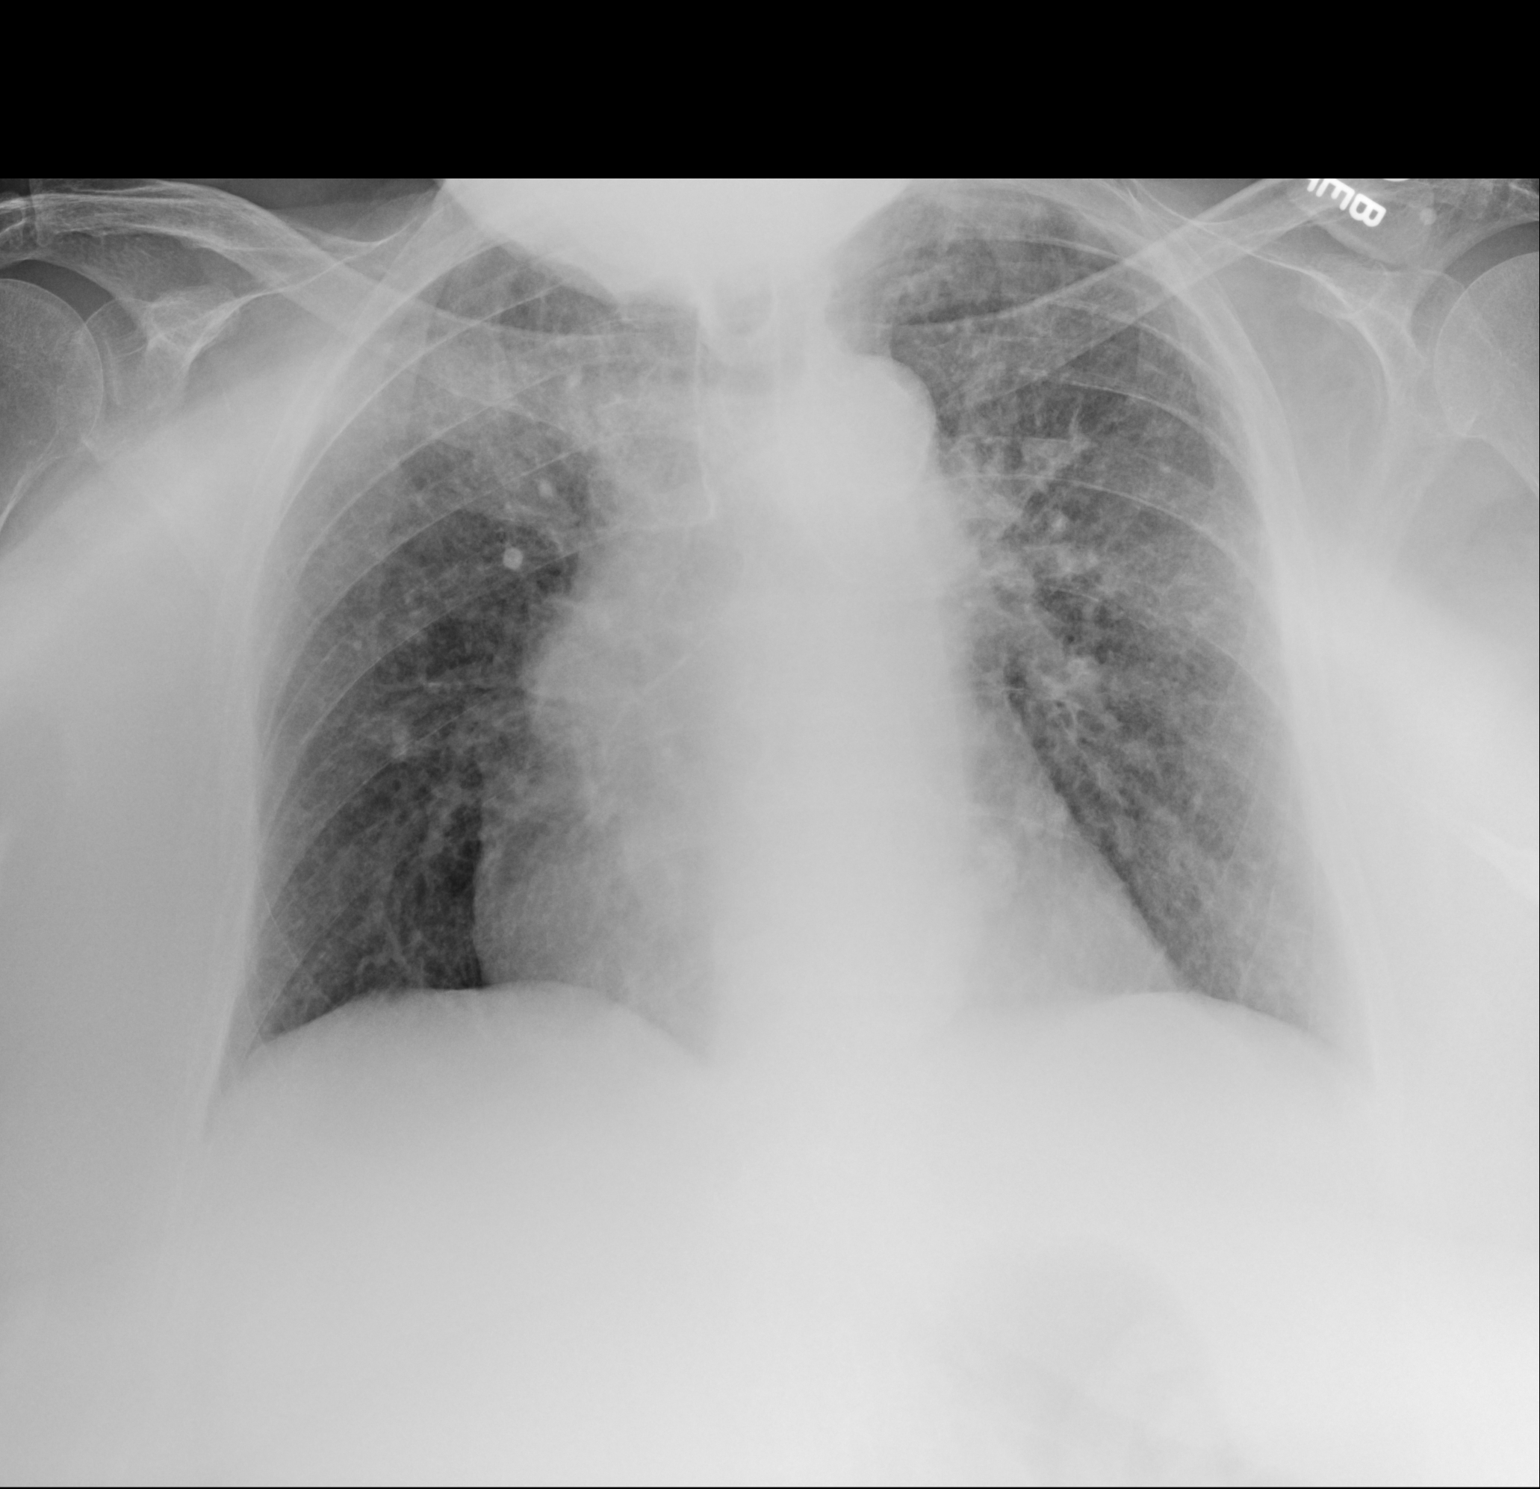

[1 of 1 positions shown; findings below may reference images not displayed]

FINDINGS: Enlargement of cardiac silhouette with pulmonary vascular
congestion.

Prominent right hilum question related to sarcoidosis.

Perihilar infiltrates slightly greater on left question pulmonary
edema and CHF.

No pleural effusion or pneumothorax.

Bones demineralized.
IMPRESSION: Enlargement of cardiac silhouette with pulmonary vascular congestion
and question mild CHF.

## 2014-06-07 ENCOUNTER — Other Ambulatory Visit: Payer: Self-pay | Admitting: Pulmonary Disease

## 2014-06-07 ENCOUNTER — Other Ambulatory Visit: Payer: Self-pay | Admitting: Cardiovascular Disease

## 2014-06-11 ENCOUNTER — Other Ambulatory Visit: Payer: Self-pay | Admitting: Cardiovascular Disease

## 2014-07-11 ENCOUNTER — Ambulatory Visit (INDEPENDENT_AMBULATORY_CARE_PROVIDER_SITE_OTHER): Payer: Medicare Other | Admitting: Pulmonary Disease

## 2014-07-11 ENCOUNTER — Other Ambulatory Visit (INDEPENDENT_AMBULATORY_CARE_PROVIDER_SITE_OTHER): Payer: Medicare Other

## 2014-07-11 ENCOUNTER — Encounter: Payer: Self-pay | Admitting: Pulmonary Disease

## 2014-07-11 VITALS — BP 120/76 | HR 72 | Temp 97.0°F | Ht 66.0 in | Wt 177.0 lb

## 2014-07-11 DIAGNOSIS — I5032 Chronic diastolic (congestive) heart failure: Secondary | ICD-10-CM

## 2014-07-11 DIAGNOSIS — R0600 Dyspnea, unspecified: Secondary | ICD-10-CM

## 2014-07-11 DIAGNOSIS — M159 Polyosteoarthritis, unspecified: Secondary | ICD-10-CM

## 2014-07-11 DIAGNOSIS — I481 Persistent atrial fibrillation: Secondary | ICD-10-CM

## 2014-07-11 DIAGNOSIS — E785 Hyperlipidemia, unspecified: Secondary | ICD-10-CM

## 2014-07-11 DIAGNOSIS — R7309 Other abnormal glucose: Secondary | ICD-10-CM

## 2014-07-11 DIAGNOSIS — I4819 Other persistent atrial fibrillation: Secondary | ICD-10-CM

## 2014-07-11 DIAGNOSIS — D649 Anemia, unspecified: Secondary | ICD-10-CM

## 2014-07-11 DIAGNOSIS — E559 Vitamin D deficiency, unspecified: Secondary | ICD-10-CM

## 2014-07-11 DIAGNOSIS — D869 Sarcoidosis, unspecified: Secondary | ICD-10-CM

## 2014-07-11 DIAGNOSIS — R609 Edema, unspecified: Secondary | ICD-10-CM

## 2014-07-11 DIAGNOSIS — I2510 Atherosclerotic heart disease of native coronary artery without angina pectoris: Secondary | ICD-10-CM

## 2014-07-11 DIAGNOSIS — I10 Essential (primary) hypertension: Secondary | ICD-10-CM

## 2014-07-11 DIAGNOSIS — E1140 Type 2 diabetes mellitus with diabetic neuropathy, unspecified: Secondary | ICD-10-CM

## 2014-07-11 DIAGNOSIS — F419 Anxiety disorder, unspecified: Secondary | ICD-10-CM

## 2014-07-11 DIAGNOSIS — M150 Primary generalized (osteo)arthritis: Secondary | ICD-10-CM

## 2014-07-11 DIAGNOSIS — I872 Venous insufficiency (chronic) (peripheral): Secondary | ICD-10-CM

## 2014-07-11 LAB — CBC WITH DIFFERENTIAL/PLATELET
Basophils Absolute: 0 10*3/uL (ref 0.0–0.1)
Basophils Relative: 0.2 % (ref 0.0–3.0)
Eosinophils Absolute: 0.1 10*3/uL (ref 0.0–0.7)
Eosinophils Relative: 1.8 % (ref 0.0–5.0)
HCT: 40.4 % (ref 36.0–46.0)
Hemoglobin: 13.5 g/dL (ref 12.0–15.0)
Lymphocytes Relative: 9.3 % — ABNORMAL LOW (ref 12.0–46.0)
Lymphs Abs: 0.8 10*3/uL (ref 0.7–4.0)
MCHC: 33.4 g/dL (ref 30.0–36.0)
MCV: 92.6 fl (ref 78.0–100.0)
Monocytes Absolute: 0.3 10*3/uL (ref 0.1–1.0)
Monocytes Relative: 3.6 % (ref 3.0–12.0)
Neutro Abs: 7.1 10*3/uL (ref 1.4–7.7)
Neutrophils Relative %: 85.1 % — ABNORMAL HIGH (ref 43.0–77.0)
Platelets: 142 10*3/uL — ABNORMAL LOW (ref 150.0–400.0)
RBC: 4.36 Mil/uL (ref 3.87–5.11)
RDW: 14.6 % (ref 11.5–15.5)
WBC: 8.3 10*3/uL (ref 4.0–10.5)

## 2014-07-11 LAB — HEPATIC FUNCTION PANEL
ALK PHOS: 98 U/L (ref 39–117)
ALT: 13 U/L (ref 0–35)
AST: 26 U/L (ref 0–37)
Albumin: 4.2 g/dL (ref 3.5–5.2)
BILIRUBIN DIRECT: 0.2 mg/dL (ref 0.0–0.3)
BILIRUBIN TOTAL: 0.8 mg/dL (ref 0.2–1.2)
TOTAL PROTEIN: 7.6 g/dL (ref 6.0–8.3)

## 2014-07-11 LAB — BASIC METABOLIC PANEL
BUN: 20 mg/dL (ref 6–23)
CALCIUM: 9.9 mg/dL (ref 8.4–10.5)
CO2: 29 meq/L (ref 19–32)
Chloride: 98 mEq/L (ref 96–112)
Creatinine, Ser: 1.24 mg/dL — ABNORMAL HIGH (ref 0.40–1.20)
GFR: 43.34 mL/min — AB (ref >60.00)
GLUCOSE: 126 mg/dL — AB (ref 70–99)
POTASSIUM: 4.2 meq/L (ref 3.5–5.1)
SODIUM: 137 meq/L (ref 135–145)

## 2014-07-11 LAB — BRAIN NATRIURETIC PEPTIDE: Pro B Natriuretic peptide (BNP): 978 pg/mL — ABNORMAL HIGH (ref 0.0–100.0)

## 2014-07-11 LAB — TSH: TSH: 3.47 u[IU]/mL (ref 0.35–4.50)

## 2014-07-11 LAB — HEMOGLOBIN A1C: Hgb A1c MFr Bld: 6.7 % — ABNORMAL HIGH (ref 4.6–6.5)

## 2014-07-11 NOTE — Patient Instructions (Signed)
Today we updated your med list in our EPIC system...    Continue your current medications the same...  Today we did your follow up blood work...    We will contact you w/ the results when available...   Call for any questions or if we can be of service in any way...  Let's plan a follow up visit in 73mo, sooner if needed for problems.[name]..[name]

## 2014-07-11 NOTE — Progress Notes (Signed)
Subjective:    Patient ID: Alexandra [name], female    DOB: 03-04-1924, 81 y.o.   MRN: [id number]  HPI 81 y/o WF here for a follow up visit... he has multiple medical problems including AB, hx inactive Sarcoid, HBP, PVD, VI, Hyperlipidemia, DM, Obesity, GERD/ Divertics/ IBS, DJD/ FM/ osteop, Anxiety, Anemia... ~  SEE PREV EPIC NOTES FOR OLDER DATA >>   ~  August 25, 2011:  She had nonQ-MI 10/12 & cath showed severe 3 vessel CAD w/ PTCA/ stent placed in LAD (99% occluded) + med rx w/ Plavix added;  ~  December 07, 2011:  Alexandra [name] was Adm 7/1 - 12/04/11 w/ unstable angina (pain involved her neck & throat, then across her chest to arms) and cath showed another 99% focal proxLAD stenosis within the prev stent & mod RCA & Circ disease (including a 90% mid-distal RCA lesion- felt too difficult to intervene); she had a PTCA/ cutting balloon for the LAD in stent restenosis;  ~  April 20, 2012:  Alexandra [name] was Adm 10/20 - 03/24/12 by Cards w/ NSTEMI & cath showed in-stent restenosis LAD- s/p cutting balloon angioplasty;   ~  July 25, 2012:  48moROV & Alexandra [name] quips "I'm still here" w/ continued cardiac problems, angina, & need for NTG rx... She notes her "nerves run away w/ me" & the Vistaril helps... [place]... We reviewed the following medical problems during today's office visit >>     AB, Hx Sarcoid> not currently on any meds for breathing; stable, inactive dis...    HBP> on Metop50-1.5tabsBid, Amlod2.5, Avapro150, Lasix40; BP=118/72 & she is feeling ok- denies current CP, palpit, ch in SOB, edema, etc...    CAD> on ASA81, Effient5, & off Plavix; Hosp 10/13 as above, now improved; she saw CardsPA 11/13- stable, continue same meds...    ASPVD> on above meds; known bilat 50-69% ICA stenoses but she is asymptomatic w/o cerebral ischemic symptoms; she is wat too sedentary & denies claud symptoms...    VI> she knows to elevate legs, elim sodium, use support hose, etc...     Hyperlipid> on Cres10, off Simva due to Norvasc Rx added; we will need to f/u w/ FLP on the Cres10...    DM> on Glimep128m off Metform due to mild RI; BS=85 & A1c=6.1; we decided to decr the Glimep1m57mo 1/2 tab/d...    Overweight> weight = 188# w/ BMI=30; we reviewed diet & exercise...    GI> GERD, Divertics, IBS> on Protonix40Bid; she denies abd pain, n/v, c/d, blood seen...    Renal Insuffic> Creat~ 1.3-1.4 range, they stopped her Metform in hosp & used Glimep1mg49m discharge...    DJD, FM> on Tylenol alone; she saw DrHarrison 2wks ago for bilat knee injections- improved...    Vit D defic> on VitD 1000u OTC; continue same...    Anxiety> on Elavil10, and Hydroxyzine25 prn, off Lorazepam; states the Hydroxyzine helps...    Anemia> on Fe, VitC, VitB12; Hg=11.1, Fe=79 (27%sat); continue supplements... We reviewed prob list, meds, xrays and labs> see below for updates >>  LABS 2/14:  Chems- ok w/ Cr=1.3 BS=85 A1c=6.1;  CBC- Hg=11.1 Fe=79 (27%sat)...  ~  September 09, 2012:  ADM 3/19 - 08/22/12 by Cards w/ CP & NSTEMI due to severe restenosis in the prox LAD stent, s/p DES to LAD, pt has refused CABG; she is a Plavix non-respnder; she also had AFib on rate control strategy (not on anticoag),  meds adjusted as indicated below...    ~  Oct 21, 2012:  6wk ROV & last visit Alexandra [name] was c/o CWP for which we rec Rest/ Heat/ Tramadol; we also decided to decr her Glimep22m back to 1/2 tab Qam; she tells me that stopped the Tramadol as she feels it reacted on her w/ confusion etc- she states that if she takes ASA839m& one Tylenol32538mhat she does just fine ("I take this at night & sleep like a baby")... She wanted me to know that she planned her funeral last week!    She had f/u w/ Cards 4/14> HBP, CAD- NSTEMI, AFib, Carotid stenosis> on ASA81 & Effient5 w/o recurrent ischemic symptoms; on Metoprolol & Lasix, BP= 120/64 & she notes heart "a flippin & a flappin on occas"...    Lipids controlled on Cres10 w/ last FLP  10/13 showing TChol 116, TG 97, HDL 46, LDL 51    BS has remained good per pt since we decr the Glimep1mg23m 1/2 Qam; lab today shows BS= 100; continue same...    Her weight is down 3# to 186# today...    Anemia> on Fe, VitC, Vit B12, etc; Labs shows Hg= 11.8 & Fe= 42 (13%); Rec to continue Fe/ VitC etc... We reviewed prob list, meds, xrays and labs> see below for updates >>  LABS 5/14:  Chems- ok w/ BS=100, BUN=26, Cr=1.3;  CBC- ok w/ Hg=11.8 & Fe=42 (13%);  TSH=15.48  ADDENDUM>>  TSH= 15.48 & we will start SYNTHROID50mc20m.. Follow up labs 01/06/13 showed TSH= 2.80 therefore continue Levothyroid 50mcg20m.  ~  January 23, 2013:  53mo RO253moJudy isBethena Royscerned about the $$ of her Effient & Crestor, but taking both regularly;  We reviewed the following medical problems during today's office visit >>     She had f/u w/ Cards 4/14> HBP, CAD- NSTEMI, AFib, Carotid stenosis> on ASA81 & Effient5 w/o recurrent ischemic symptoms; on Metoprolol-50AM&25PM + Lasix40, BP= 138/64 & she denies recent CP, palpit, dizzy, ch in SOB/edema; DrRothbart has retired & she will be seeing DrKoneswaran soon...    Lipids controlled on Cres10 w/ last FLP 10/13 showing TChol 116, TG 97, HDL 46, LDL 51; reminded to come FASTING for blood work!    BS has remained good per pt since we decr the Glimep1mg to 52m Qam; lab 5/14 shows BS= 100; & last A1c 2/14 = 6.1    Thyroid is now replaced on Synthroid50 w/ f/u TSH 8/14 = 2.80, continue same...    Her weight is up 3# to 188# today...    She still sees DrSHarrison every 3-101mo for 60mos in her knees & she tells me she is going to see him tomorrow...    Anemia> on Fe, VitC, Vit B12, etc; Labs 5/14 showed Hg= 11.8 & Fe= 42 (13%); Rec to continue Fe/ VitC etc... We reviewed prob list, meds, xrays and labs> see below for updates >>   ~  November 15, 2013:  9-45mo ROV 91modd-on appt for SOB> Alexandra [name] was HBethena [facility]ys12[facility]CH[facility], [place], now [place]... She is on home O2  24/7 and has her own pulse ox checker... She gives a long rambling hx but is overall doing well- has walker, oxygen, and does her own ADLs- no new complaints or concerns...    Hx AB, Hx Sarcoid, PulmHTN> on O2 at 2L/min 24/7; PAsys=75 (see 2DEcho below);     She is followed by DrKoneswarEleonore Chiquito, seen 4/15  for her HBP, ASHD-s/p MI, Chr diastolicCHF, AFib, & HL> on Metop50Bid, Lasix40, ASA81, Effient5; she has refused CABG, and is a Plavix nonresponder & INTOL to Imdur; no changes made & he checks her Q3-9mo..     DM> on Glimep136m taking 1/2 tab qam; last labs 3/15 showed BS=109, A1c=6.6; advised same med, better diet, continue wt loss efforts (down 8# to 180# today)...     Chol> on Cres10 but c/o the cost; last FLP was 10/13 showing TChol 116, TG 97, HDL 46, LDL 51    Hypothyroid> on Synthroid50 & last TSH was 3/15 = 1.82, stable, continue same...     She still sees DrHarrison, Ortho every 26m49mor bilat knee injections- last 4/15 and notes reviewed...     Anxiety> on Elavil10/d & Atarax25 prn We reviewed prob list, meds, xrays and labs> see below for updates >>   CXR 11/14 showed cardiomegaly, pulm vasc congestion, prom right hilum, boney demineralization...   2DEcho 12/14 showed severeLVH, hyperdynamic LV w/ EF=65-70%, mild intracavity gradient, mildAS, mild to mod MS, severely dil LA&RA, & severe pulmHTN w/ PAsys=75   LABS 3/15:  Chems- ok w/ BS=109, A1c=6.6, Cr=1.2;  CBC- ok w/ Hg=11.9;  TSH=1.82...  ~  July 11, 2014:  56mo65mo & JudyBethena Roysurns for a follow up visit, she appears to be stable but doesn't think she has long time remaining- she has taken care of all her final arrangements etc...  She [place] DJD & he gives her Cortisone shots every 3 months and they still help she says... She continues to f/u w/ CARDS- DrKoneswaran every 3-4 months for her CAD, hx non-STEMI, modMS & mild AS along w/ severe pulmHTN, and prev  cath w/ severe 3 vessel dis for which she refused to consider surg... She has a remote hx of Sarcoidosis (inactive) and AB (w/o recent exac)... Prob list as above & meds as follows>>    On ASA81, Effient5/d, Metoprolol25Bid, Lasix40; BP= 120/76 & she is sedentary but appears stable overall; BNP= 978 (improved) w/ Cr=1.24...    On Crestor10 and last FLP [place] looked good- reminded to ret FASTING for f/u FLP...    On Amaryl 1mg-29mking 1/2 tab daily; Labs 2/16 showed BS=126, A1c=6.7 and rec to continue same...    On Synthroid50mcg926mnd labs 2/16 showed TSH= 3.47 and rec to continue same...    On Protonix40 and she denies abd pain, n/v, c/d, blood seen...    Other meds include: Vits, Fe, Elavil 10mg/d76marax 25mg pr17m  We reviewed prob list, meds, xrays and labs> see below for updates >> she had the 2015 flu vaccine last Oct...  LABS 2/16:  Chems- ok w/ BS=126, A1c=6.7, Cr=1.24;  CBC- wnl w/ Hg= 13.5;  TSH=3.47;  BNP=978 (improved)...           Problem List:  Hx of ASTHMATIC BRONCHITIS, ACUTE (ICD-466.0) - uses Mucinex as needed... she has chr stable DOE w/o change... she say she walks "some" but is obviously too sedentary due to her arthritis... she has PNEUMOVAX several yrs ago, and gets the yearly flu vaccine...  Hx of SARCOIDOSIS (ICD-135) - initially diagnosed in 1992 w/ CXR showing diffuse ILDis, +Gallium scan, ACE=61, & Bronch w/ bx showing non-caseating granulomas... treated w/ Pred & weaned off w/ no active disease since then... last CTChest 7/04 w/ ca++ Ao & coronaries, +mediastinal fat, centilobular emphysema, some scarring... stable without new symptoms of cough,  sputum, CP or dyspnea... ~  CXR 1/09 showed stable chr lung changes & prom right hilum... ~  CXR 2/11 showed diffuse ILDz, no adenopathy, NAD.[name]. ~  CXR 2/12 showed borderline cardiomeg, tort Ao, sl incr markings as before, NAD.[name]. ~  CXR 10/12 in hosp showed cardiomeg, calcif in Ao, vasc congestion w/ pulm edema & sm  effusions... ~  CXR 7/13 in hosp showed cardiomegaly & mild vasc congestion... ~  CXR 10/13 showed stable heart size, diffuse mitral annular calcif, clear lungs, DJD in TSpine. ~  CXR 11/14 showed cardiomegaly, pulm vasc congestion, prom right hilum, boney demineralization. ~  njo signs of any dis activity...  HYPERTENSION (ICD-401.9) >>  ~  controlled on meds: METOPROLOL 68m- 1.5 tabsBid, AMLODIPINE 2.550md, AVAPRO 15085m, LASIX 70m20m.. ~  Myoview 12/05 was neg- no ischemia, no infarct... ~  EKG 2/11 showed NSR, rate 82/min, WNL... ~[name] 4/12:  DrHochrein increased her Metoprolol from 25Bid to 50Bid for her BP & ectopy... ~  6/12:  BP= 136/74 & denies HA, visual changes, CP, palipit, syncope, change in dyspnea, edema, etc... ~  3/13:  BP= 124/70 & she is feeling well, denies CP/ SOB/ ch in edema/ etc... ~  7/13:  BP= 124/60 & these meds were not changed during her recent hosp for angina w/ cath & PCI for LAD in-stent restenosis... ~  8/13:  BP= 130/60 & she denies CP, palpit, ch in SOB or edema... ~  11/13:  BP= 124/56 on same 4med75mgimen & she denies CP, palpit, SOB, edema... ~  2/14: on Metop50-1.5tabsBid, Amlod2.5, Avapro150, Lasix40; BP=118/72 & she is feeling ok- denies current CP, palpit, ch in SOB, edema, etc. ~  4/14: on Metop50-decr to 1AM & 1/2PM, Lasix40, & off Amlod & Avapro; BP=120/72 & she is feeling ok- denies current CP, palpit, ch in SOB, edema, etc. ~  6/15: She is [facility], seen 4/15 for her HBP, ASHD-s/p MI, Chr diastolicCHF, AFib, & HL> on Metop50Bid, Lasix40, ASA81, Effient5; she has refused CABG, and is a Plavix nonresponder & INTOL to Imdur; BP= 118/76, no changes made & he checks her Q3-6mo..36mo  2/16: she remains under the care of DrKoneswaran> on ASA81, Effient5/d, Metoprolol25Bid, Lasix40; BP= 120/76 & she is sedentary but appears stable overall; BNP= 978 (improved) w/ Cr=1.24...  VALVULAR HEART DISEASE>  PALPITATIONS & Ventric Ectopy>   Eval & Rx by DrHochrein, his notes are reviewed...  ~  On METOPROLOL 75mgBi58mshe uses HYDROXYZINE (Vistaril) 25mg Pr80mich she says is helpful... ~  Event Monitor done 4/12 and no definite AFib encountered, just PVCs...  ~  EKG shows just some PACs, otherw neg... ~  2DEcho 10/13 showed mild LVH, norm LVF w/ EF=65-70%; AoV w/ mildly thickened & calcif leaflets; heavy mitral annular calcif, mod thickened leaflets, & modMR... ~  2DEcho 12/14 showed severeLVH, hyperdynamic LV w/ EF=65-70%, mild intracavity gradient, mildAS, mild to mod MS, severely dil LA&RA, & severe pulmHTN w/ PAsys=75...  CORONARY ARTERY DISEASE >> on ASA 81mg/d &24mIENT5mg (off 79mvix now); plus the meds above & followed by DrRothbart... ~  She had nonQ-MI 10/12 & cath showed severe 3 vessel CAD w/ PTCA/ stent placed in LAD (99% occluded) + med rx w/ Plavix added; there was AK anteriorly but LVF was preserved... ~  EKG 11/12 showed NSR, rate85, PACs, septal infarct, otherw neg... ~  DrRothbart did a Myoview scan 3/13 w/ SVT developing after the infusion w/ CP & abn STsegment  changes; she spont reverted to NSR w/o need for intervention; Myoview images showed scar anteroseptal area + soft tissue attenuation & minor ischemia at the apex & EF=77%; They incr her Metoprolol to 67mBid... ~  7/13:  [facility] showing 99% prox LAD in-stent restenosis- PCI w/ cutting ballon & improved; she had mod dis in RCA & Circ (including worsening 90% distal RCA stenosis- but intervention was r/o)... EKG showed NSR, PACs, rate66, otherw wnl, NAD... ~  10[facility]13:  [facility] showed in-stent restenosis LAD- s/p cutting balloon angioplasty; she also had resid dis in RCA & CIRC- continue med rx;  They also noted HBP w/ hypotension this adm, asymptomatic SVT on tele, valv heart dis w/ thickened/ calcified AoV & modMR, mod carotid stenoses, mild renal insuffic... Followed by DrRothbart & cardiology notes reviewed &  Med Rec performed... ~  2/14: on ASA81, Effient5, & off Plavix; Hosp 10/13 as above, now improved; she saw CardsPA 11/13- stable, continue same meds... ~  4/14: on ASA81, Effient5 & off Plavix; Hosp 10/13 & 3/14 w/ NSTEMI & PTCA/ DES to LAD- she has refused CABG; also had AFib & they decided on rate control; she had f/u OV Cards 4/14- continue duel antiplat Rx w/ ASA & Effient, meds adjusted. ~  6/15: She is followed by DEleonore Chiquitofor Cards, seen 4/15 for her HBP, ASHD-s/p MI, Chr diastolicCHF, AFib, & HL> on Metop50Bid, Lasix40, ASA81, Effient5; she has refused CABG, and is a Plavix nonresponder & INTOL to Imdur; no changes made & he checks her Q3-436mo.  ~  2/16: 2/16: she remains under the care of DrKoneswaran> on ASA81, Effient5/d, Metoprolol25Bid, Lasix40; BP= 120/76 & she is sedentary but appears stable overall; BNP= 978 (improved) w/ Cr=1.24...  PERIPHERAL VASCULAR DISEASE (ICD-443.9) - on ASA 81, EFFIENT5... doing well w/o claud symptoms or cerebral ischemic symptoms... ~  Art Dopplers of LE's 6/06 showed biphasic waveforms in ant tib & peroneal arts, normal ABI's... ~  CDopplers 5/12 showed mild heterogeneous plaque bilat w/ 40-59% bilat ICA stenoses... ~  CDopplers 4/13 showed mild to mod plaque bilat, 50-69% bilat ICA stenoses (lower end but R>L)...  VENOUS INSUFFICIENCY (ICD-459.81) - on low sodium diet and the Lasix... she knows to elevate legs, wear support hose, etc... ~  7/13:  She has persistent bilat LE edema & weight is similar 192# on LASIX 4081m   HYPERLIPIDEMIA (ICD-272.4) - on SIMVASTATIN 73m50m.. ~  FLP [place]8 showed TChol 121, TG 120, HDL 46, LDL 51... looks great- continue Simva40. ~  FLP [place]9 showed TChol 137, TG 165, HDL 37, LDL 67 ~  FLP 9/09 showed TChol 133, TG 141, HDL 47, LDL 57 ~  FLP 5/10 showed TChol 131, TG 190, HDL 46, LDL 47 ~  FLP 2/11 showed TChol 129, TG 129, HDL 52, LDL 51 ~  FLP 2/12 showed TChol 134, TG 159, HDL 49, LDL 53 ~  FLP 3/13 on Simva40  showed TChol 112, TG 157, HDL 46, LDL 35... Continue same + better low fat diet. ~  FLP 10/13 on Cres10 showed TChol 116, TG 97, HDL 46, LDL 51... Changed to Cres10 during 10/13 adm... ~  She is asked to ret FASTING for FLP f/u...  DM (ICD-250.00) - on diet + GLIMEPIRIDE => see below... ~  labs 1/08 showed BS=130 and HgA1c=6.1 ~  labs 1/09 showed BS= 137, HgA1c= 6.2... diet and exercise are the keys!!! ~  labs 9/09 showed BS=  119, HgA1c= 6.3 ~  labs 5/10 showed BS= 134, A1c= 6.3 ~  labs 2/11 showed BS= 122, A1c= 6.3 ~  labs 6/11 showed BS= 106 ~  Labs 2/12 showed BS= 138, A1c= 6.3 ~  Labs 3/13 on Metform500Bid showed BS= 126, A1c= 6.0.Marland KitchenMarland Kitchen Continue same, get wt down! ~  7/13:  Metformin was held 7/13 Hosp w/ Creat= 0.9-1.2 & GFR= 40-50 range; we decided to restart METFORMIN 591m Qam only... ~  Labs 8/13 on Metform500 showed BS= 118 ~  Labs 10/13 in hosp showed BS=113, A1c=6.3 ~  2/14: on Glimep12m off Metform due to mild RI; BS=85 & A1c=6.1; we decided to decr the Glimep1m74mo 1/2 tab/d. ~  4/14: she didn't decr the Glimep as prev discussed> asked to decr the Glimep1mg68m 1/2 tab Qam... ~  Labs 5/14 on Glim1mg-61m daily and BS=100 ~  6/15: DM> on Glimep1mg- 7ming 1/2 tab qam; last labs 3/15 showed BS=109, A1c=6.6; advised same med, better diet, continue wt loss efforts (down 8# to 180# today)..  ~  2/16: On Amaryl 1mg- t9mng 1/2 tab daily; Labs 2/16 showed BS=126, A1c=6.7 and rec to continue same..  HYPOMarland KitchenHYROID >>  ~  Routine labs 5/14 showed TSH= 15.48 & we started SYNTHROID 50mcg/d59m~  Follow up Labds 8/14 on Synthroid50 showed TSH= 2.80, continue same... ~  6/15: on Synthroid50 & last TSH was 3/15 = 1.82, stable, continue same... ~  2/16: On Synthroid50mcg/d 38mlabs 2/16 showed TSH= 3.47 and rec to continue same.  OBESITY (ICD-278.01) - prev wt 227#  & 5' 2" tall for a BMI=41-42...  ~  weight 1/10 = 217# ~  weight 5/10 = 215# ~  weight 9/10 = 210# ~  weight 2/11 = 210# ~   weight 6/11 = 203# ~  weight 10/11 = 203# ~  Weight 6/12 = 195# ~  Weight 3/13 = 192# ~  Weight 7/13 = 192# ~  Weight 8/13 = 188# ~  Weight 11/13 = 187# ~  Weight 2/14 = 188# ~  Weight 5/14 = 186# ~  Weight 8/14 = 188# ~  Weight 6/15 = 180# ~  Weight 2/16 = 177#  GERD (ICD-530.81) - pt was placed on PROTONIX 40mgBid 711mdue to reflux symptoms and Anemia... ~  She is encouraged to f/u w/ GI & she indicates that she will call DrRehman when she is ready...  DIVERTICULOSIS, COLON (ICD-562.10) - had colonoscopy by DrDBrodie 1997 that was neg x divertics... now [place]...  IRRITABLE BOWEL SYNDROME (ICD-564.1)  RENAL INSUFFICIENCY >> Creat ~1.2 and GFR ~ 40-50 range...  DEGENERATIVE JOINT DISEASE, ADVANCED (ICD-715.90) - she has mod severe bilat knee pain and sees DrHarrison, Ortho for shots "every three months"... she is opposed to surgery (see his EMR notes- reviewed)...  VITAMIN D DEFICIENCY (ICD-268.9) ~  labs 5/10 showed Vit D level =13... therefore rec Vit D 50000 u weekly. ~  labs 2/11 showed Vit D level = 32... OK to change to VitD 1000 u daily.. ~  BMD done 6/11 showed TScores -1.3 in Spine, and -2.5 in left FemNeck... rec> start Alendronate 70mg/wk bu22me refuses Bisphos Rx "I'll take my chances"...  FIBROMYALGIA (ICD-729.1)  ANXIETY (ICD-300.00) - we prev perscribed HYDROXYZINE PAMOATE 25mg Prn fo41mhroat closing" symptoms... ~  7/13:  She admits that "my nerves are shot" after [facility]- 1/2 to 69mab Tid as needed... ~  2/14:  She stopped the Ativan on her own &  now using just the ELAVIL 60m & HYDROXYZINE 224mprn...  ANEMIA (ICD-285.9) - hx mild anemia w/ Hg = 10.8 Jan09 & started Fe daily at that time...  ~  labs 5/09 showed Hg= 11.7, Fe= 64 ~  labs 9/09 showed Hg= 11.4, Fe= 43... rec- take FeSO4 w/ Vit C 50069m. ~  labs 5/10 showed Hg= 11.3,  Fe= 50 ~  labs 2/11 showed Hg= 10.7, Fe= 36 (12%)... rec> incr Fe Vit C to  Bid. ~  labs 6/11 showed Hg= 10.9, Fe= 58 (19%sat)... continue Bid Fe ~  Labs 2/12 showed Hg= 11.6, Fe= 51 (16%sat)... Continue same. ~  Labs 3/13 showed Hg= 11.3... Marland KitchenMarland Kitchenntinue FeSO4 Bid... ~  Labs during 7/13 Hosp showed Hg= 8-9 range... rec to continue FeSO4 Bid... ~  Labs 8/13 showed Hg= 10.7 and Fe= 45 (16%)... Ok to decr Fe to one daily... ~  Labs 10/13 in hosp showed Hg= 10.1 ~  2/14: on Fe, VitC, VitB12; Hg=11.1, Fe=79 (27%sat); continue supplements. ~  4/14: on Fe, VitC, VitB12; Hg= 9-10 range; continue supplements... ~  5/14: on Fe, VitC, VitB12, etc; Labs shows Hg= 11.8 & Fe= 42 (13%); Rec to continue Fe/ VitC etc ~  6/15: she remains on Fe, VitC, VitB12, etc... ~  2/16: on Fe, Vit B12, C, D> labs showed Hg= 13.5& MCV= 93...  INTERMITTENT VERTIGO (ICD-780.4) - prev eval DrWeymann w/ Meclizine Prn...   Past Surgical History  Procedure Laterality Date  . Cholecystectomy    . Tonsillectomy    . Hernia repair    . Cataract extraction    . Coronary angioplasty with stent placement  03/21/2012  . Left heart catheterization with coronary angiogram N/A 12/01/2011    Procedure: LEFT HEART CATHETERIZATION WITH CORONARY ANGIOGRAM;  Surgeon: Peter M JorMartiniqueD;  [facility];  Service: Cardiovascular;  Laterality: N/A;  . Percutaneous coronary stent intervention (pci-s)  12/01/2011    Procedure: PERCUTANEOUS CORONARY STENT INTERVENTION (PCI-S);  Surgeon: Peter M JorMartiniqueD;  [facility];  Service: Cardiovascular;;  . Left heart catheterization with coronary angiogram N/A 03/21/2012    Procedure: LEFT HEART CATHETERIZATION WITH CORONARY ANGIOGRAM;  Surgeon: DalLarey DresserD;  [facility];  Service: Cardiovascular;  Laterality: N/A;  . Percutaneous coronary stent intervention (pci-s)  03/21/2012    Procedure: PERCUTANEOUS CORONARY STENT INTERVENTION (PCI-S);  Surgeon: ChrBurnell BlanksD;  [facility];  Service: Cardiovascular;;  . Left heart  catheterization with coronary angiogram N/A 08/18/2012    Procedure: LEFT HEART CATHETERIZATION WITH CORONARY ANGIOGRAM;  Surgeon: ChrBurnell BlanksD;  [facility];  Service: Cardiovascular;  Laterality: N/A;  . Percutaneous coronary stent intervention (pci-s)  08/18/2012    Procedure: PERCUTANEOUS CORONARY STENT INTERVENTION (PCI-S);  Surgeon: ChrBurnell BlanksD;  [facility];  Service: Cardiovascular;;   des to prox lad with ivus and cutting balloon to existing lad stent     Outpatient Encounter Prescriptions as of 07/11/2014  Medication Sig  . acetaminophen (TYLENOL) 500 MG tablet Take 1 tablet (500 mg total) by mouth every 6 (six) hours as needed for pain.  . aMarland Kitchenitriptyline (ELAVIL) 10 MG tablet TAKE 1 TABLET ONCE DAILY.  . aMarland Kitchencorbic acid (VITAMIN C) 500 MG tablet Take 500 mg by mouth 2 (two) times daily. Twice daily with iron tablet  . aspirin EC 81 MG tablet Take 81 mg by mouth every morning.   . cholecalciferol (VITAMIN D) 1000 UNITS tablet Take  1,000 Units by mouth daily.  . CRESTOR 10 MG tablet TAKE (1) TABLET BY MOUTH ONCE DAILY.  [name] econazole nitrate 1 % cream Apply 1 application topically daily as needed. FOR FEET IRRITATION  . EFFIENT 5 MG TABS tablet TAKE (1) TABLET BY MOUTH ONCE DAILY.  . ferrous sulfate 324 (65 FE) MG TBEC Take 1 tablet by mouth 2 (two) times daily.   . furosemide (LASIX) 40 MG tablet TAKE 1 TABLET ONCE DAILY.  [name] glimepiride (AMARYL) 1 MG tablet TAKE 1/2 TABLET DAILY.  [name] glucose blood (ONE TOUCH TEST STRIPS) test strip Test once daily. DX;  250.00  . guaiFENesin (MUCINEX) 600 MG 12 hr tablet Take 600 mg by mouth 2 (two) times daily.  . hydrOXYzine (ATARAX/VISTARIL) 25 MG tablet TAKE 1 TABLET EVERY 6 HOURS AS NEEDED FOR ITCHING OR ANXIETY.  [name] levothyroxine (SYNTHROID, LEVOTHROID) 50 MCG tablet TAKE 1 TABLET ONCE A DAY BEFORE BREAKFAST.  [name] meclizine (ANTIVERT) 25 MG tablet TAKE 1 TABLET 3 TIMES A DAY AS NEEDED FOR DIZZINESS.  .  metoprolol tartrate (LOPRESSOR) 25 MG tablet Take 1 tablet (25 mg total) by mouth 2 (two) times daily.  . nitroGLYCERIN (NITROSTAT) 0.4 MG SL tablet Place 1 tablet (0.4 mg total) under the tongue every 5 (five) minutes as needed for chest pain (up to 3 doses).  [name] ONE TOUCH LANCETS MISC Test once daily. Dx:  250.00  . pantoprazole (PROTONIX) 40 MG tablet TAKE (1) TABLET TWICE DAILY.  . vitamin B-12 (CYANOCOBALAMIN) 1000 MCG tablet Take 1,000 mcg by mouth every morning.  . [DISCONTINUED] EFFIENT 5 MG TABS tablet TAKE (1) TABLET BY MOUTH ONCE DAILY. (Patient not taking: Reported on 07/11/2014)  . [DISCONTINUED] EFFIENT 5 MG TABS tablet TAKE (1) TABLET BY MOUTH ONCE DAILY. (Patient not taking: Reported on 07/11/2014)  . [DISCONTINUED] pantoprazole (PROTONIX) 40 MG tablet TAKE (1) TABLET BY MOUTH ONCE DAILY.    Allergies  Allergen Reactions  . Codeine Other (See Comments)    Reaction: severe GI pain  . Diazepam Other (See Comments)    REACTION: causes pt unable to wake up after taking  . Lipitor [Atorvastatin Calcium] Other (See Comments)    Reaction: causes vertigo  . Penicillins Other (See Comments)    Reaction: unknown (childhood )  . Plavix [Clopidogrel Bisulfate]     Nonresponder  . Sulfonamide Derivatives Other (See Comments)    Reaction: unknown (childhood)  . Mango Flavor Rash    Current Medications, Allergies, Past Medical History, Past Surgical History, Family History, and Social History were reviewed in [employer] record.   Review of Systems        See HPI - all other systems neg except as noted... The patient complains of decreased hearing, dyspnea on exertion, peripheral edema, muscle weakness, and difficulty walking.  The patient denies anorexia, fever, weight loss, weight gain, vision loss, hoarseness, chest pain, syncope, prolonged cough, headaches, hemoptysis, abdominal pain, melena, hematochezia, severe indigestion/heartburn, hematuria,  incontinence, suspicious skin lesions, transient blindness, depression, unusual weight change, abnormal bleeding, enlarged lymph nodes, and angioedema.   Objective:   Physical Exam      WD, Obese, 81 y/o WF in NAD... GENERAL:  Alert & oriented; pleasant & cooperative... she is very talkative... HEENT:  [place]/AT, Glasses, EOM-full, PERRLA, TMs-wax, NOSE-clear, THROAT-clear & wnl. NECK:  Supple w/ fairROM; no JVD; no carotid bruits; no thyromegaly or nodules palpated; no lymphadenopathy. CHEST:  decr BS bilat but clear w/o wheezing, rales or signs of consolidation... HEART:  sl irregular rhythm; without murmurs/ rubs/ or gallops detected... ABDOMEN:  Obese w/ panniculus, soft & nontender; normal bowel sounds; no organomegaly or masses palpated. EXT:  mod arthritic changes; no varicose veins/ +venous insuffic/ tr+edema bilat NEURO:  CN's intact, no focal neuro deficits... DERM:  few ecchymoses, no rash etc...  RADIOLOGY DATA:  Reviewed in the EPIC EMR & discussed w/ the patient...  LABORATORY DATA:  Reviewed in the EPIC EMR & discussed w/ the patient...   Assessment & Plan:    AB>  Breathing is stable w/o exac, on continuous O2...  Hx Sarcoid>  Old CXR w/ chr changes, NAD...  CAD>  on ASA81, Effient5 & off Plavix; [place] 10/13, 3/14, 12/14 as noted- w/ NSTEMI & PTCA/ DES to LAD- she has refused CABG; also had AFib & they decided on rate control; she had f/u [facility] 4/14- continue duel antiplat Rx w/ ASA & Effient, meds adjusted, follwed by Eleonore Chiquito... CWP>  We discussed rest, heat, Tramadol prn...  HBP>  Controlled on meds, continue same, take regularly...  Palpit, AFib>  On going eval & med adjustments from [facility]; better on the incr Metoprolol but notes that the Vistaril really helps too...  PVD>  On ASA & Effient now w/o cerebral ischemic symptoms; [place] 4/13 w/ sl progression- continue meds...  Ven Insuffic/ Edema>  We reviewed the nec sodium restriction and continue  the Lasix40...  HYPERLIPID>  On Cres10 & due for f/u FLP on ret...  DM>  On Glimep56m- 1/2 daily & we will continue same for now...  Hypothyroid>  TSH looks good on Synthroid50- continue same...  GI>  GERD, Divertics, IBS>  Stable, continue Protonix40Bid...  DJD>  [place]  Anxiety>  Off Lorazepam, on Elavil & Atarax...   Patient's Medications  New Prescriptions   No medications on file  Previous Medications   ACETAMINOPHEN (TYLENOL) 500 MG TABLET    Take 1 tablet (500 mg total) by mouth every 6 (six) hours as needed for pain.   AMITRIPTYLINE (ELAVIL) 10 MG TABLET    TAKE 1 TABLET ONCE DAILY.   ASCORBIC ACID (VITAMIN C) 500 MG TABLET    Take 500 mg by mouth 2 (two) times daily. Twice daily with iron tablet   ASPIRIN EC 81 MG TABLET    Take 81 mg by mouth every morning.    CHOLECALCIFEROL (VITAMIN D) 1000 UNITS TABLET    Take 1,000 Units by mouth daily.   CRESTOR 10 MG TABLET    TAKE (1) TABLET BY MOUTH ONCE DAILY.   ECONAZOLE NITRATE 1 % CREAM    Apply 1 application topically daily as needed. FOR FEET IRRITATION   EFFIENT 5 MG TABS TABLET    TAKE (1) TABLET BY MOUTH ONCE DAILY.   FERROUS SULFATE 324 (65 FE) MG TBEC    Take 1 tablet by mouth 2 (two) times daily.    FUROSEMIDE (LASIX) 40 MG TABLET    TAKE 1 TABLET ONCE DAILY.   GLIMEPIRIDE (AMARYL) 1 MG TABLET    TAKE 1/2 TABLET DAILY.   GLUCOSE BLOOD (ONE TOUCH TEST STRIPS) TEST STRIP    Test once daily. DX;  250.00   GUAIFENESIN (MUCINEX) 600 MG 12 HR TABLET    Take 600 mg by mouth 2 (two) times daily.   HYDROXYZINE (ATARAX/VISTARIL) 25 MG TABLET    TAKE 1 TABLET EVERY 6 HOURS AS NEEDED FOR ITCHING OR ANXIETY.   LEVOTHYROXINE (SYNTHROID, LEVOTHROID) 50 MCG TABLET    TAKE 1  TABLET ONCE A DAY BEFORE BREAKFAST.   MECLIZINE (ANTIVERT) 25 MG TABLET    TAKE 1 TABLET 3 TIMES A DAY AS NEEDED FOR DIZZINESS.   METOPROLOL TARTRATE (LOPRESSOR) 25 MG TABLET    Take 1 tablet (25 mg total) by mouth 2 (two) times daily.    NITROGLYCERIN (NITROSTAT) 0.4 MG SL TABLET    Place 1 tablet (0.4 mg total) under the tongue every 5 (five) minutes as needed for chest pain (up to 3 doses).   ONE TOUCH LANCETS MISC    Test once daily. Dx:  250.00   PANTOPRAZOLE (PROTONIX) 40 MG TABLET    TAKE (1) TABLET TWICE DAILY.   VITAMIN B-12 (CYANOCOBALAMIN) 1000 MCG TABLET    Take 1,000 mcg by mouth every morning.  Modified Medications   No medications on file  Discontinued Medications   EFFIENT 5 MG TABS TABLET    TAKE (1) TABLET BY MOUTH ONCE DAILY.   EFFIENT 5 MG TABS TABLET    TAKE (1) TABLET BY MOUTH ONCE DAILY.   PANTOPRAZOLE (PROTONIX) 40 MG TABLET    TAKE (1) TABLET BY MOUTH ONCE DAILY.

## 2014-07-12 NOTE — Progress Notes (Signed)
Quick Note:  ATC line busy x 2 ______

## 2014-07-17 ENCOUNTER — Ambulatory Visit: Payer: Medicare Other | Admitting: Orthopedic Surgery

## 2014-08-02 ENCOUNTER — Other Ambulatory Visit: Payer: Self-pay | Admitting: Pulmonary Disease

## 2014-08-08 ENCOUNTER — Ambulatory Visit (HOSPITAL_COMMUNITY)
Admission: RE | Admit: 2014-08-08 | Discharge: 2014-08-08 | Disposition: A | Discharge status: Home / Self Care | Payer: Medicare Other | Source: Ambulatory Visit | Attending: Cardiovascular Disease | Admitting: Cardiovascular Disease

## 2014-08-08 ENCOUNTER — Other Ambulatory Visit: Payer: Self-pay | Admitting: Cardiovascular Disease

## 2014-08-08 ENCOUNTER — Ambulatory Visit (INDEPENDENT_AMBULATORY_CARE_PROVIDER_SITE_OTHER): Payer: Medicare Other | Admitting: Cardiovascular Disease

## 2014-08-08 ENCOUNTER — Encounter: Payer: Self-pay | Admitting: Cardiovascular Disease

## 2014-08-08 ENCOUNTER — Telehealth: Payer: Self-pay | Admitting: *Deleted

## 2014-08-08 VITALS — BP 137/76 | HR 82 | Ht 66.0 in | Wt 179.0 lb

## 2014-08-08 DIAGNOSIS — Z136 Encounter for screening for cardiovascular disorders: Secondary | ICD-10-CM | POA: Diagnosis not present

## 2014-08-08 DIAGNOSIS — I5033 Acute on chronic diastolic (congestive) heart failure: Secondary | ICD-10-CM | POA: Diagnosis not present

## 2014-08-08 DIAGNOSIS — I38 Endocarditis, valve unspecified: Secondary | ICD-10-CM

## 2014-08-08 DIAGNOSIS — R0602 Shortness of breath: Secondary | ICD-10-CM

## 2014-08-08 DIAGNOSIS — E785 Hyperlipidemia, unspecified: Secondary | ICD-10-CM

## 2014-08-08 DIAGNOSIS — R600 Localized edema: Secondary | ICD-10-CM

## 2014-08-08 DIAGNOSIS — I517 Cardiomegaly: Secondary | ICD-10-CM | POA: Diagnosis not present

## 2014-08-08 DIAGNOSIS — R5383 Other fatigue: Secondary | ICD-10-CM

## 2014-08-08 DIAGNOSIS — I2510 Atherosclerotic heart disease of native coronary artery without angina pectoris: Secondary | ICD-10-CM

## 2014-08-08 DIAGNOSIS — I10 Essential (primary) hypertension: Secondary | ICD-10-CM | POA: Diagnosis not present

## 2014-08-08 DIAGNOSIS — I130 Hypertensive heart and chronic kidney disease with heart failure and stage 1 through stage 4 chronic kidney disease, or unspecified chronic kidney disease: Secondary | ICD-10-CM

## 2014-08-08 DIAGNOSIS — I4891 Unspecified atrial fibrillation: Secondary | ICD-10-CM

## 2014-08-08 MED ORDER — FUROSEMIDE 80 MG PO TABS: 80.0000 mg | ORAL_TABLET | Freq: Every day | ORAL | Status: DC

## 2014-08-08 NOTE — Patient Instructions (Signed)
Your physician recommends that you schedule a follow-up appointment in: 1 MONTH WITH DR. Purvis SheffieldKONESWARAN  Your physician has recommended you make the following change in your medication:   INCREASE LASIX 80 MG DAILY IN THE MORNING  CONTINUE ALL OTHER MEDICATIONS AS DIRECTED   A chest x-ray takes a picture of the organs and structures inside the chest, including the heart, lungs, and blood vessels. This test can show several things, including, whether the heart is enlarges; whether fluid is building up in the lungs; and whether pacemaker / defibrillator leads are still in place.  Your physician recommends that you return for lab work Friday 08/10/14  Thank you for choosing [facility]!!

## 2014-08-08 NOTE — Telephone Encounter (Signed)
-----   Message from Suresh A Koneswaran, MD sent at 08/08/2014  3:44 PM EST ----- Please forward to PCP and pulmonary for follow up. Continue present plan with Lasix 80 mg daily. 

## 2014-08-08 NOTE — Progress Notes (Signed)
Patient ID: Alexandra [name], female   DOB: 1924/03/19, 81 y.o.   MRN: [id number]      SUBJECTIVE: Alexandra [name] is an 81 year old woman with CAD, status post hospitalization in March of 2014 with a non-ST elevation MI. She was found to have severe stenosis in the proximal LAD stent and complex CAD with moderately severe disease of the RCA and circumflex. She was offered CABG but refused. Per Dr. Clifton [name], attempted PCI of the RCA or circumflex should only be done if she had recurrent symptoms. He felt that PCI would be very difficult given the degree of calcium and tortuosity. She was continued on DAPT (Plavix nonresponder, on Effient). She was not placed on Coumadin or Xarelto for atrial fibrillation in the setting of dual antiplatelet therapy.  Echocardiography on 05-01-2013 revealed hyperdynamic LV systolic function, EF 65-70%, with a mild intracavitary gradient, mild aortic stenosis, mild to moderate mitral stenosis, and severe pulmonary hypertension.  She is chronically on 2L oxygen.  She denies chest pain and has not had to use nitroglycerin. Her legs and feet are more swollen and she had to cut her sneakers in order to get them on today. She is chronically short of breath. She is able to do the things that she wants to do such as knitting and doing crossword puzzles. She said she fatigues very easily.  ECG performed today demonstrates atrial fibrillation, heart rate 80 bpm, with inferior T-wave inversions.   Review of Systems: As per "subjective", otherwise negative.  Allergies  Allergen Reactions  . Codeine Other (See Comments)    Reaction: severe GI pain  . Diazepam Other (See Comments)    REACTION: causes pt unable to wake up after taking  . Lipitor [Atorvastatin Calcium] Other (See Comments)    Reaction: causes vertigo  . Penicillins Other (See Comments)    Reaction: unknown (childhood )  . Plavix [Clopidogrel Bisulfate]     Nonresponder  . Sulfonamide Derivatives Other (See  Comments)    Reaction: unknown (childhood)  . Mango Flavor Rash    Current Outpatient Prescriptions  Medication Sig Dispense Refill  . acetaminophen (TYLENOL) 500 MG tablet Take 1 tablet (500 mg total) by mouth every 6 (six) hours as needed for pain.    [name]. amitriptyline (ELAVIL) 10 MG tablet TAKE (1) TABLET BY MOUTH ONCE DAILY. 30 tablet 6  . ascorbic acid (VITAMIN C) 500 MG tablet Take 500 mg by mouth 2 (two) times daily. Twice daily with iron tablet    . aspirin EC 81 MG tablet Take 81 mg by mouth every morning.     . cholecalciferol (VITAMIN D) 1000 UNITS tablet Take 1,000 Units by mouth daily.    . CRESTOR 10 MG tablet TAKE (1) TABLET BY MOUTH ONCE DAILY. 30 tablet 6  . econazole nitrate 1 % cream Apply 1 application topically daily as needed. FOR FEET IRRITATION    . EFFIENT 5 MG TABS tablet TAKE (1) TABLET BY MOUTH ONCE DAILY. 30 tablet 6  . ferrous sulfate 324 (65 FE) MG TBEC Take 1 tablet by mouth 2 (two) times daily.     . furosemide (LASIX) 40 MG tablet TAKE 1 TABLET ONCE DAILY. 30 tablet 6  . glimepiride (AMARYL) 1 MG tablet TAKE 1/2 TABLET DAILY. 15 tablet 6  . glucose blood (ONE TOUCH TEST STRIPS) test strip Test once daily. DX;  250.00 50 each 6  . guaiFENesin (MUCINEX) 600 MG 12 hr tablet Take 600 mg by mouth 2 (two) times daily.    .[name]  hydrOXYzine (ATARAX/VISTARIL) 25 MG tablet TAKE 1 TABLET EVERY 6 HOURS AS NEEDED FOR ITCHING OR ANXIETY. 30 tablet 6  . levothyroxine (SYNTHROID, LEVOTHROID) 50 MCG tablet TAKE 1 TABLET ONCE A DAY BEFORE BREAKFAST. 30 tablet 6  . meclizine (ANTIVERT) 25 MG tablet TAKE 1 TABLET 3 TIMES A DAY AS NEEDED FOR DIZZINESS. 50 tablet 1  . metoprolol tartrate (LOPRESSOR) 25 MG tablet Take 1 tablet (25 mg total) by mouth 2 (two) times daily. 180 tablet 3  . nitroGLYCERIN (NITROSTAT) 0.4 MG SL tablet Place 1 tablet (0.4 mg total) under the tongue every 5 (five) minutes as needed for chest pain (up to 3 doses). 25 tablet 4  . ONE TOUCH LANCETS MISC Test once  daily. Dx:  250.00 200 each 6  . pantoprazole (PROTONIX) 40 MG tablet TAKE (1) TABLET TWICE DAILY. 60 tablet 3  . vitamin B-12 (CYANOCOBALAMIN) 1000 MCG tablet Take 1,000 mcg by mouth every morning.     No current facility-administered medications for this visit.    Past Medical History  Diagnosis Date  . Asthmatic bronchitis   . Sarcoidosis     a. 1990s of lung tx with prednisone temporarily.  [name] HTN (hypertension)   . PVD (peripheral vascular disease)     a. 06/2012 Carotid U/S: < 50% bilat ICA stenosis.  . Hyperlipemia   . DM (diabetes mellitus)   . Obesity   . GERD (gastroesophageal reflux disease)   . Diverticulosis of colon   . IBS (irritable bowel syndrome)   . DJD (degenerative joint disease)     Hip and knee pain; hip bursitis; patellar subluxation  . Vitamin D deficiency disease   . Fibromyalgia   . Anxiety     a. Guaiac neg 03/2012.  . Intermittent vertigo   . Anemia   . Venous insufficiency   . PSVT (paroxysmal supraventricular tachycardia)     a. Prior hx of palpitations - developed SVT during stress Myoview 09/2011. b. Noted on tele 03/2012.  . Valvular heart disease     a. moderately thickened/mildly calcified aortic valve, mod MR 03/20/12  . CKD (chronic kidney disease), stage II   . PAC (premature atrial contraction)   . CAD (coronary artery disease) 03/2011    a. NSTEMI 03/2011 complicated by pulm edema s/p DES to subtotal LAD. b. [place] s/p PTCA/cutting balloon to prox LAD for ISR 12/01/11 (med rx for RCA/Cx dz).  c. NSTEMI 03/2012 s/p cutting angioplasty of ISR of prox LAD 03/21/12 (PLAVIX NONRESPONDER). d. DES to LAD for ISR 07/2012 (pt refused consideration of CABG).  [name] A-fib     a. Noted 07/2012 - not on anticoag due for need for DAPT and age.  . Thrombocytopenia     a. Noted 07/2012.  . On home oxygen therapy     2L/min with ambulation as of 07/2012.    Past Surgical History  Procedure Laterality Date  . Cholecystectomy    . Tonsillectomy    . Hernia  repair    . Cataract extraction    . Coronary angioplasty with stent placement  03/21/2012  . Left heart catheterization with coronary angiogram N/A 12/01/2011    Procedure: LEFT HEART CATHETERIZATION WITH CORONARY ANGIOGRAM;  Surgeon: Peter M Swaziland, MD;  Location: [facility] CATH LAB;  Service: Cardiovascular;  Laterality: N/A;  . Percutaneous coronary stent intervention (pci-s)  12/01/2011    Procedure: PERCUTANEOUS CORONARY STENT INTERVENTION (PCI-S);  Surgeon: Peter M Swaziland, MD;  Location: [facility] CATH LAB;  Service: Cardiovascular;;  .  Left heart catheterization with coronary angiogram N/A 03/21/2012    Procedure: LEFT HEART CATHETERIZATION WITH CORONARY ANGIOGRAM;  Surgeon: Laurey Morale, MD;  Location: [facility] CATH LAB;  Service: Cardiovascular;  Laterality: N/A;  . Percutaneous coronary stent intervention (pci-s)  03/21/2012    Procedure: PERCUTANEOUS CORONARY STENT INTERVENTION (PCI-S);  Surgeon: Kathleene Hazel, MD;  Location: [facility] CATH LAB;  Service: Cardiovascular;;  . Left heart catheterization with coronary angiogram N/A 08/18/2012    Procedure: LEFT HEART CATHETERIZATION WITH CORONARY ANGIOGRAM;  Surgeon: Kathleene Hazel, MD;  Location: [facility] CATH LAB;  Service: Cardiovascular;  Laterality: N/A;  . Percutaneous coronary stent intervention (pci-s)  08/18/2012    Procedure: PERCUTANEOUS CORONARY STENT INTERVENTION (PCI-S);  Surgeon: Kathleene Hazel, MD;  Location: [facility] CATH LAB;  Service: Cardiovascular;;   des to prox lad with ivus and cutting balloon to existing lad stent     History   Social History  . Marital Status: Widowed    Spouse Name: N/A  . Number of Children: 1  . Years of Education: N/A   Occupational History  . Not on file.   Social History Main Topics  . Smoking status: Never Smoker   . Smokeless tobacco: Never Used  . Alcohol Use: No     Comment: social  . Drug Use: No  . Sexual Activity: No   Other Topics Concern  . Not on file   Social History  Narrative   Pt is only child. Husband Duanne Guess passed away in Oct 07, 2002   Pt has 1 son in [place](his wife is MD doing [employer] Research )      Filed Vitals:   08/08/14 1305  BP: 137/76  Pulse: 82  Height:  (1.676 m)  Weight: 179 lb (81.194 kg)  SpO2: 94%    PHYSICAL EXAM General: NAD, elderly, frail. Neck: No JVD, no thyromegaly or thyroid nodule.  Lungs: Bilateral rales, 1/4 up on right, at base on left.  CV: Nondisplaced PMI. Irregular rhythm, normal S1/S2, no S3, III/VI holosystolic murmur along left sternal border. Bilateral (1+) pitting pretibial and pedal edema.  Abdomen: Soft, nontender, no distention.  Neurologic: Alert and oriented.  Psych: Normal affect.  Skin: Normal. Musculoskeletal: No gross deformities. Extremities: No clubbing or cyanosis.   ECG: Most recent ECG reviewed.    ASSESSMENT AND PLAN: 1. CAD: Remains on ASA, Crestor, Effient, and metoprolol. I previously obtained Dr. Gibson Ramp opinion about coronary angiography/PCI, understanding that the risk is high. He thought medical management was preferred, but would consider high-risk PCI if deemed necessary. 2. Atrial fibrillation: Rate is controlled on metoprolol 25 mg bid. No anticoagulation due to being on dual antiplatelet therapy.  3. Hyperlipidemia: On Crestor. No changes. 4. Essential HTN: Well controlled for age on current therapy. No changes. 5. Acute on chronic diastolic heart failure with bilateral leg/feet edema: On Lasix 40 mg daily. I will obtain a chest xray and increase Lasix to 80 mg q am and check a BMET on 3/11. On 2/10, BUN 20 and SCr 1.24. 6. Valvular heart disease with both aortic and mitral stenosis: Increase diuretic dosage as noted per #5.  Dispo: f/u 1 month.   Prentice Docker, M.D., F.A.C.C.

## 2014-08-08 NOTE — Telephone Encounter (Signed)
LM for pt. Forwarded to Dr. Kriste BasqueNadel

## 2014-08-09 ENCOUNTER — Telehealth: Payer: Self-pay | Admitting: *Deleted

## 2014-08-09 NOTE — Telephone Encounter (Signed)
-----   Message from Laqueta LindenSuresh A Koneswaran, MD sent at 08/08/2014  3:44 PM EST ----- Please forward to PCP and pulmonary for follow up. Continue present plan with Lasix 80 mg daily.

## 2014-08-09 NOTE — Telephone Encounter (Signed)
Pt made aware, forwarded to DR. Kriste BasqueNadel

## 2014-08-10 ENCOUNTER — Other Ambulatory Visit: Payer: Self-pay | Admitting: Cardiovascular Disease

## 2014-08-10 LAB — BASIC METABOLIC PANEL
BUN: 20 mg/dL (ref 6–23)
CHLORIDE: 97 meq/L (ref 96–112)
CO2: 30 mEq/L (ref 19–32)
CREATININE: 1.29 mg/dL — AB (ref 0.50–1.10)
Calcium: 9.4 mg/dL (ref 8.4–10.5)
Glucose, Bld: 116 mg/dL — ABNORMAL HIGH (ref 70–99)
Potassium: 3.8 mEq/L (ref 3.5–5.3)
Sodium: 138 mEq/L (ref 135–145)

## 2014-08-13 ENCOUNTER — Telehealth: Payer: Self-pay | Admitting: Pulmonary Disease

## 2014-08-13 ENCOUNTER — Telehealth: Payer: Self-pay | Admitting: *Deleted

## 2014-08-13 NOTE — Telephone Encounter (Signed)
Notes Recorded by Lesle ChrisAngela G Hill, LPN on 6/96/29523/14/2016 at 4:23 PM Patient notified. Already has follow up scheduled for 08/28/2014 with Dr. Purvis SheffieldKoneswaran.

## 2014-08-13 NOTE — Telephone Encounter (Signed)
-----   Message from Laqueta LindenSuresh A Koneswaran, MD sent at 08/13/2014  9:01 AM EDT ----- Creatinine stable.

## 2014-08-13 NOTE — Telephone Encounter (Signed)
Called and spoke with pt and she is aware of SN recs of her cxr.  She will stay on the lasix 80 mg daily.  Pt will stay off of salt, stay on the lasix and appt has been scheduled with SN on 4/13.

## 2014-08-13 NOTE — Telephone Encounter (Signed)
I called spoke with pt. She saw cards last week and had CXR done (in epic). She reports she was told she needed to see SN. Pt reports she is not able to come in and be seen. She wants to know if SN can look at the report and see what is wrong. She reports she has not been told anything other than she needs to f/u with SN. Please advise thanks

## 2014-08-14 ENCOUNTER — Ambulatory Visit (INDEPENDENT_AMBULATORY_CARE_PROVIDER_SITE_OTHER): Payer: Medicare Other | Admitting: Orthopedic Surgery

## 2014-08-14 VITALS — BP 124/81 | Ht 66.0 in | Wt 179.0 lb

## 2014-08-14 DIAGNOSIS — M170 Bilateral primary osteoarthritis of knee: Secondary | ICD-10-CM | POA: Diagnosis not present

## 2014-08-14 NOTE — Progress Notes (Signed)
Chief Complaint  Patient presents with  . Follow-up    3 month recheck / repeat bilateral knee injections    BP 124/81 mmHg  Ht 5\' 6"  (1.676 m)  Wt 179 lb (81.194 kg)  BMI 28.91 kg/m2  Procedure note left knee injection verbal consent was obtained to inject left knee joint  Timeout was completed to confirm the site of injection  The medications used were 40 mg of Depo-Medrol and 1% lidocaine 3 cc  Anesthesia was provided by ethyl chloride and the skin was prepped with alcohol.  After cleaning the skin with alcohol a 20-gauge needle was used to inject the left knee joint. There were no complications. A sterile bandage was applied.   Procedure note right knee injection verbal consent was obtained to inject right knee joint  Timeout was completed to confirm the site of injection  The medications used were 40 mg of Depo-Medrol and 1% lidocaine 3 cc  Anesthesia was provided by ethyl chloride and the skin was prepped with alcohol.  After cleaning the skin with alcohol a 20-gauge needle was used to inject the right knee joint. There were no complications. A sterile bandage was applied.

## 2014-08-15 ENCOUNTER — Telehealth: Payer: Self-pay | Admitting: Orthopedic Surgery

## 2014-08-15 NOTE — Telephone Encounter (Signed)
Faxed as requested

## 2014-08-15 NOTE — Telephone Encounter (Signed)
Ms. Manson PasseyBrown called and asked if we could please fax the Rx to [facility] for her Lite Weight Wheel Chair fax # [phone] ph# [phone] she states that all of her medical equipment comes from them, they have all her medical insurance on file.

## 2014-08-28 ENCOUNTER — Encounter: Payer: Self-pay | Admitting: Cardiovascular Disease

## 2014-08-28 ENCOUNTER — Ambulatory Visit (INDEPENDENT_AMBULATORY_CARE_PROVIDER_SITE_OTHER): Payer: Medicare Other | Admitting: Cardiovascular Disease

## 2014-08-28 VITALS — BP 141/68 | HR 70 | Ht 66.0 in | Wt 170.0 lb

## 2014-08-28 DIAGNOSIS — I5032 Chronic diastolic (congestive) heart failure: Secondary | ICD-10-CM

## 2014-08-28 DIAGNOSIS — N183 Chronic kidney disease, stage 3 unspecified: Secondary | ICD-10-CM

## 2014-08-28 DIAGNOSIS — I482 Chronic atrial fibrillation, unspecified: Secondary | ICD-10-CM

## 2014-08-28 DIAGNOSIS — I2510 Atherosclerotic heart disease of native coronary artery without angina pectoris: Secondary | ICD-10-CM | POA: Diagnosis not present

## 2014-08-28 DIAGNOSIS — I38 Endocarditis, valve unspecified: Secondary | ICD-10-CM

## 2014-08-28 DIAGNOSIS — E785 Hyperlipidemia, unspecified: Secondary | ICD-10-CM

## 2014-08-28 DIAGNOSIS — I10 Essential (primary) hypertension: Secondary | ICD-10-CM

## 2014-08-28 DIAGNOSIS — I130 Hypertensive heart and chronic kidney disease with heart failure and stage 1 through stage 4 chronic kidney disease, or unspecified chronic kidney disease: Secondary | ICD-10-CM

## 2014-08-28 DIAGNOSIS — R600 Localized edema: Secondary | ICD-10-CM

## 2014-08-28 NOTE — Patient Instructions (Addendum)
   Prescription given today for light weight wheelchair . Continue all current medications. Follow up in  3 months    RX for wheelchair faxed to [facility], [place] at patient request.

## 2014-08-28 NOTE — Progress Notes (Signed)
Patient ID: Traci SermonJudith T Christoph, female   DOB: [birth year], 81 y.o.   MRN: [id number]      SUBJECTIVE: [name] is an 81 year old woman with CAD, status post hospitalization in March of 2014 with a non-ST elevation MI. She was found to have severe stenosis in the proximal LAD stent and complex CAD with moderately severe disease of the RCA and circumflex. She was offered CABG but refused. Per Dr. Clifton JamesMcAlhany, attempted PCI of the RCA or circumflex should only be done if she had recurrent symptoms. He felt that PCI would be very difficult given the degree of calcium and tortuosity. She was continued on DAPT (Plavix nonresponder, on Effient). She was not placed on Coumadin or Xarelto for atrial fibrillation in the setting of dual antiplatelet therapy.  Echocardiography on 05-01-2013 revealed hyperdynamic LV systolic function, EF 65-70%, with a mild intracavitary gradient, mild aortic stenosis, mild to moderate mitral stenosis, and severe pulmonary hypertension.  She is chronically on 2L oxygen.  At her last visit three weeks ago, she had leg and feet and swelling for which I increased her Lasix dose. I obtained a chest x-ray which demonstrated a right pleural effusion, small to moderate in size.  Wt 170 lbs today (179 lbs on 3/9).  She is feeling better. She requests a lightweight transport wheelchair. She is thinking about moving into assisted living. [place] She is here with her friend Rubin Payordith.    Review of Systems: As per "subjective", otherwise negative.  Allergies  Allergen Reactions  . Codeine Other (See Comments)    Reaction: severe GI pain  . Diazepam Other (See Comments)    REACTION: causes pt unable to wake up after taking  . Lipitor [Atorvastatin Calcium] Other (See Comments)    Reaction: causes vertigo  . Penicillins Other (See Comments)    Reaction: unknown (childhood )  . Plavix [Clopidogrel Bisulfate]     Nonresponder  . Sulfonamide Derivatives Other  (See Comments)    Reaction: unknown (childhood)  . Mango Flavor Rash    Current Outpatient Prescriptions  Medication Sig Dispense Refill  . acetaminophen (TYLENOL) 500 MG tablet Take 1 tablet (500 mg total) by mouth every 6 (six) hours as needed for pain.    [name]. amitriptyline (ELAVIL) 10 MG tablet TAKE (1) TABLET BY MOUTH ONCE DAILY. 30 tablet 6  . ascorbic acid (VITAMIN C) 500 MG tablet Take 500 mg by mouth 2 (two) times daily. Twice daily with iron tablet    . aspirin EC 81 MG tablet Take 81 mg by mouth every morning.     . cholecalciferol (VITAMIN D) 1000 UNITS tablet Take 1,000 Units by mouth daily.    . CRESTOR 10 MG tablet TAKE (1) TABLET BY MOUTH ONCE DAILY. 30 tablet 6  . econazole nitrate 1 % cream Apply 1 application topically daily as needed. FOR FEET IRRITATION    . EFFIENT 5 MG TABS tablet TAKE (1) TABLET BY MOUTH ONCE DAILY. 30 tablet 6  . ferrous sulfate 324 (65 FE) MG TBEC Take 1 tablet by mouth 2 (two) times daily.     . furosemide (LASIX) 80 MG tablet Take 1 tablet (80 mg total) by mouth daily. 90 tablet 3  . glimepiride (AMARYL) 1 MG tablet TAKE 1/2 TABLET DAILY. 15 tablet 6  . glucose blood (ONE TOUCH TEST STRIPS) test strip Test once daily. DX;  250.00 50 each 6  . guaiFENesin (MUCINEX) 600 MG 12 hr tablet Take 600 mg  by mouth 2 (two) times daily.    . hydrOXYzine (ATARAX/VISTARIL) 25 MG tablet TAKE 1 TABLET EVERY 6 HOURS AS NEEDED FOR ITCHING OR ANXIETY. 30 tablet 6  . levothyroxine (SYNTHROID, LEVOTHROID) 50 MCG tablet TAKE 1 TABLET ONCE A DAY BEFORE BREAKFAST. 30 tablet 6  . meclizine (ANTIVERT) 25 MG tablet TAKE 1 TABLET 3 TIMES A DAY AS NEEDED FOR DIZZINESS. 50 tablet 1  . metoprolol tartrate (LOPRESSOR) 25 MG tablet Take 1 tablet (25 mg total) by mouth 2 (two) times daily. 180 tablet 3  . nitroGLYCERIN (NITROSTAT) 0.4 MG SL tablet Place 1 tablet (0.4 mg total) under the tongue every 5 (five) minutes as needed for chest pain (up to 3 doses). 25 tablet 4  . ONE TOUCH  LANCETS MISC Test once daily. Dx:  250.00 200 each 6  . pantoprazole (PROTONIX) 40 MG tablet TAKE (1) TABLET TWICE DAILY. 60 tablet 3  . vitamin B-12 (CYANOCOBALAMIN) 1000 MCG tablet Take 1,000 mcg by mouth every morning.     No current facility-administered medications for this visit.    Past Medical History  Diagnosis Date  . Asthmatic bronchitis   . Sarcoidosis     a. 1990s of lung tx with prednisone temporarily.  [name] HTN (hypertension)   . PVD (peripheral vascular disease)     a. 06/2012 Carotid U/S: < 50% bilat ICA stenosis.  . Hyperlipemia   . DM (diabetes mellitus)   . Obesity   . GERD (gastroesophageal reflux disease)   . Diverticulosis of colon   . IBS (irritable bowel syndrome)   . DJD (degenerative joint disease)     Hip and knee pain; hip bursitis; patellar subluxation  . Vitamin D deficiency disease   . Fibromyalgia   . Anxiety     a. Guaiac neg 03/2012.  . Intermittent vertigo   . Anemia   . Venous insufficiency   . PSVT (paroxysmal supraventricular tachycardia)     a. Prior hx of palpitations - developed SVT during stress Myoview 09/2011. b. Noted on tele 03/2012.  . Valvular heart disease     a. moderately thickened/mildly calcified aortic valve, mod MR 03/20/12  . CKD (chronic kidney disease), stage II   . PAC (premature atrial contraction)   . CAD (coronary artery disease) 03/2011    a. NSTEMI 03/2011 complicated by pulm edema s/p DES to subtotal LAD. b. [place] s/p PTCA/cutting balloon to prox LAD for ISR 12/01/11 (med rx for RCA/Cx dz).  c. NSTEMI 03/2012 s/p cutting angioplasty of ISR of prox LAD 03/21/12 (PLAVIX NONRESPONDER). d. DES to LAD for ISR 07/2012 (pt refused consideration of CABG).  [name] A-fib     a. Noted 07/2012 - not on anticoag due for need for DAPT and age.  . Thrombocytopenia     a. Noted 07/2012.  . On home oxygen therapy     2L/min with ambulation as of 07/2012.    Past Surgical History  Procedure Laterality Date  . Cholecystectomy    .  Tonsillectomy    . Hernia repair    . Cataract extraction    . Coronary angioplasty with stent placement  03/21/2012  . Left heart catheterization with coronary angiogram N/A 12/01/2011    Procedure: LEFT HEART CATHETERIZATION WITH CORONARY ANGIOGRAM;  Surgeon: Peter M Swaziland, MD;  Location: [facility] CATH LAB;  Service: Cardiovascular;  Laterality: N/A;  . Percutaneous coronary stent intervention (pci-s)  12/01/2011    Procedure: PERCUTANEOUS CORONARY STENT INTERVENTION (PCI-S);  Surgeon: Peter M Swaziland, MD;  Location: [facility] CATH LAB;  Service: Cardiovascular;;  . Left heart catheterization with coronary angiogram N/A 03/21/2012    Procedure: LEFT HEART CATHETERIZATION WITH CORONARY ANGIOGRAM;  Surgeon: Laurey Morale, MD;  Location: [facility] CATH LAB;  Service: Cardiovascular;  Laterality: N/A;  . Percutaneous coronary stent intervention (pci-s)  03/21/2012    Procedure: PERCUTANEOUS CORONARY STENT INTERVENTION (PCI-S);  Surgeon: Kathleene Hazel, MD;  Location: [facility] CATH LAB;  Service: Cardiovascular;;  . Left heart catheterization with coronary angiogram N/A 08/18/2012    Procedure: LEFT HEART CATHETERIZATION WITH CORONARY ANGIOGRAM;  Surgeon: Kathleene Hazel, MD;  Location: [facility] CATH LAB;  Service: Cardiovascular;  Laterality: N/A;  . Percutaneous coronary stent intervention (pci-s)  08/18/2012    Procedure: PERCUTANEOUS CORONARY STENT INTERVENTION (PCI-S);  Surgeon: Kathleene Hazel, MD;  Location: [facility] CATH LAB;  Service: Cardiovascular;;   des to prox lad with ivus and cutting balloon to existing lad stent     History   Social History  . Marital Status: Widowed    Spouse Name: N/A  . Number of Children: 1  . Years of Education: N/A   Occupational History  . Not on file.   Social History Main Topics  . Smoking status: Never Smoker   . Smokeless tobacco: Never Used  . Alcohol Use: No     Comment: social  . Drug Use: No  . Sexual Activity: No   Other Topics Concern  . Not on file     Social History Narrative   Pt is only child. Husband Duanne Guess passed away in 10-25-02   Pt has 1 son in [place](his wife is MD doing [employer] )      Filed Vitals:   08/28/14 1323  BP: 141/68  Pulse: 70  Height:  (1.676 m)  Weight: 170 lb (77.111 kg)  SpO2: 92%    PHYSICAL EXAM General: NAD, elderly, frail. Neck: No JVD, no thyromegaly or thyroid nodule.  Lungs: Diminished sounds at right base, no rales.  CV: Nondisplaced PMI. Irregular rhythm, normal S1/S2, no S3, III/VI holosystolic murmur along left sternal border. Trivial pretibial and pedal edema.  Abdomen: Soft, nontender, no distention.  Neurologic: Alert and oriented.  Psych: Normal affect.  Skin: Normal. Musculoskeletal: No gross deformities. Extremities: No clubbing or cyanosis.   ECG: Most recent ECG reviewed.      ASSESSMENT AND PLAN: 1. CAD: Remains on ASA, Crestor, Effient, and metoprolol. I previously obtained Dr. Gibson Ramp opinion about coronary angiography/PCI, understanding that the risk is high. He thought medical management was preferred, but would consider high-risk PCI if deemed necessary. 2. Atrial fibrillation: Rate is controlled on metoprolol 25 mg bid. No anticoagulation due to being on dual antiplatelet therapy.  3. Hyperlipidemia: On Crestor. No changes. 4. Essential HTN: Well controlled for age on current therapy. No changes. 5. Chronic diastolic heart failure: Euvolemic and stable. Weight is down 9 lbs since last visit. Continue Lasix 80 mg. BUN 20, SCr 1.29 on 08/10/14. 6. Valvular heart disease with both aortic and mitral stenosis: Stable. Will continue to monitor.  Dispo: f/u 3 months.   Prentice Docker, M.D., F.A.C.C.

## 2014-09-11 ENCOUNTER — Other Ambulatory Visit: Payer: Self-pay | Admitting: Cardiovascular Disease

## 2014-09-12 ENCOUNTER — Other Ambulatory Visit (INDEPENDENT_AMBULATORY_CARE_PROVIDER_SITE_OTHER): Payer: Medicare Other

## 2014-09-12 ENCOUNTER — Ambulatory Visit (INDEPENDENT_AMBULATORY_CARE_PROVIDER_SITE_OTHER): Payer: Medicare Other | Admitting: Pulmonary Disease

## 2014-09-12 ENCOUNTER — Ambulatory Visit (INDEPENDENT_AMBULATORY_CARE_PROVIDER_SITE_OTHER)
Admission: RE | Admit: 2014-09-12 | Discharge: 2014-09-12 | Disposition: A | Discharge status: Home / Self Care | Payer: Medicare Other | Source: Ambulatory Visit | Attending: Pulmonary Disease | Admitting: Pulmonary Disease

## 2014-09-12 ENCOUNTER — Encounter: Payer: Self-pay | Admitting: Pulmonary Disease

## 2014-09-12 VITALS — BP 118/70 | HR 67 | Temp 97.1°F | Ht 63.0 in | Wt 166.0 lb

## 2014-09-12 DIAGNOSIS — D869 Sarcoidosis, unspecified: Secondary | ICD-10-CM | POA: Diagnosis not present

## 2014-09-12 DIAGNOSIS — I5032 Chronic diastolic (congestive) heart failure: Secondary | ICD-10-CM

## 2014-09-12 DIAGNOSIS — I481 Persistent atrial fibrillation: Secondary | ICD-10-CM

## 2014-09-12 DIAGNOSIS — I2510 Atherosclerotic heart disease of native coronary artery without angina pectoris: Secondary | ICD-10-CM

## 2014-09-12 DIAGNOSIS — R0600 Dyspnea, unspecified: Secondary | ICD-10-CM | POA: Diagnosis not present

## 2014-09-12 DIAGNOSIS — I10 Essential (primary) hypertension: Secondary | ICD-10-CM

## 2014-09-12 DIAGNOSIS — R609 Edema, unspecified: Secondary | ICD-10-CM

## 2014-09-12 DIAGNOSIS — I4819 Other persistent atrial fibrillation: Secondary | ICD-10-CM

## 2014-09-12 DIAGNOSIS — I872 Venous insufficiency (chronic) (peripheral): Secondary | ICD-10-CM

## 2014-09-12 DIAGNOSIS — J90 Pleural effusion, not elsewhere classified: Secondary | ICD-10-CM | POA: Insufficient documentation

## 2014-09-12 LAB — BASIC METABOLIC PANEL
BUN: 31 mg/dL — ABNORMAL HIGH (ref 6–23)
CHLORIDE: 95 meq/L — AB (ref 96–112)
CO2: 33 meq/L — AB (ref 19–32)
Calcium: 10 mg/dL (ref 8.4–10.5)
Creatinine, Ser: 1.31 mg/dL — ABNORMAL HIGH (ref 0.40–1.20)
GFR: 40.66 mL/min — ABNORMAL LOW (ref >60.00)
Glucose, Bld: 128 mg/dL — ABNORMAL HIGH (ref 70–99)
Potassium: 3.9 mEq/L (ref 3.5–5.1)
SODIUM: 137 meq/L (ref 135–145)

## 2014-09-12 LAB — BRAIN NATRIURETIC PEPTIDE: PRO B NATRI PEPTIDE: 1324 pg/mL — AB (ref 0.0–100.0)

## 2014-09-12 NOTE — Patient Instructions (Signed)
Today we updated your med list in our EPIC system...    Continue your current medications the same...  Today we checked a follow up CXR & blood work...    We will contact you w/ the results when available...   Keep up the good work!!!  Call for any questions...  Let's plan a follow up visit in 30mo, sooner if needed for problems.[name]..[name]

## 2014-09-12 NOTE — Progress Notes (Signed)
Subjective:    Patient ID: Alexandra [name], female    DOB: [birth year], 81 y.o.   MRN: [id number]  HPI 81 y/o WF here for a follow up visit... he has multiple medical problems including AB, hx inactive Sarcoid, HBP, PVD, VI, Hyperlipidemia, DM, Obesity, GERD/ Divertics/ IBS, DJD/ FM/ osteop, Anxiety, Anemia... ~  SEE PREV EPIC NOTES FOR OLDER DATA >>   ~  August 25, 2011:  She had nonQ-MI 10/12 & cath showed severe 3 vessel CAD w/ PTCA/ stent placed in LAD (99% occluded) + med rx w/ Plavix added;  ~  December 07, 2011:  [name] was Adm 7/1 - 12/04/11 w/ unstable angina (pain involved her neck & throat, then across her chest to arms) and cath showed another 99% focal proxLAD stenosis within the prev stent & mod RCA & Circ disease (including a 90% mid-distal RCA lesion- felt too difficult to intervene); she had a PTCA/ cutting balloon for the LAD in stent restenosis;  ~  April 20, 2012:  [name] was Adm 10/20 - 03/24/12 by Cards w/ NSTEMI & cath showed in-stent restenosis LAD- s/p cutting balloon angioplasty;   LABS 2/14:  Chems- ok w/ Cr=1.3 BS=85 A1c=6.1;  CBC- Hg=11.1 Fe=79 (27%sat)... ~  September 09, 2012:  ADM 3/19 - 08/22/12 by Cards w/ CP & NSTEMI due to severe restenosis in the prox LAD stent, s/p DES to LAD, pt has refused CABG; she is a Plavix non-respnder; she also had AFib on rate control strategy (not on anticoag), meds adjusted as indicated below...    LABS 5/14:  Chems- ok w/ BS=100, BUN=26, Cr=1.3;  CBC- ok w/ Hg=11.8 & Fe=42 (13%);  TSH=15.48 & we will start SYNTHROID63mg/d... Follow up labs 01/06/13 showed TSH= 2.80 therefore continue Levothyroid 56m/d...  ~  November 15, 2013:  9-1056moV and add-on appt for SOB> [facility] Di[facility], [place], now back [place]... She is on home O2 24/7 and has her own pulse ox checker... She gives a long rambling hx but is overall doing well- has walker, oxygen, and does her own ADLs- no new complaints or concerns...    Hx AB, Hx  Sarcoid, PulmHTN> on O2 at 2L/min 24/7; PAsys=75 (see 2DEcho below);     She is followed by DrKEleonore Chiquitor Cards, seen 4/15 for her HBP, ASHD-s/p MI, Chr diastolicCHF, AFib, & HL> on Metop50Bid, Lasix40, ASA81, Effient5; she has refused CABG, and is a Plavix nonresponder & INTOL to Imdur; no changes made & he checks her Q3-60mo77mo    DM> on Glimep1mg-62mking 1/2 tab qam; last labs 3/15 showed BS=109, A1c=6.6; advised same med, better diet, continue wt loss efforts (down 8# to 180# today)...     Chol> on Cres10 but c/o the cost; last FLP was 10/13 showing TChol 116, TG 97, HDL 46, LDL 51    Hypothyroid> on Synthroid50 & last TSH was 3/15 = 1.82, stable, continue same...     She still sees DrHarrison, Ortho every 71mo f38moilat knee injections- last 4/15 and notes reviewed...     Anxiety> on Elavil10/d & Atarax25 prn We reviewed prob list, meds, xrays and labs> see below for updates >>   CXR 11/14 showed cardiomegaly, pulm vasc congestion, prom right hilum, boney demineralization...   2DEcho 12/14 showed severeLVH, hyperdynamic LV w/ EF=65-70%, mild intracavity gradient, mildAS, mild to mod MS, severely dil LA&RA, & severe pulmHTN w/ PAsys=75   LABS 3/15:  Chems- ok w/ BS=109, A1c=6.6, Cr=1.2;  CBC- ok w/ Hg=11.9;  TSH=1.82...  ~  July 11, 2014:  49moROV & JBethena Roysreturns for a follow up visit, she appears to be stable but doesn't think she has long time remaining- she has taken care of all her final arrangements etc...  She [place] & he gives her Cortisone shots every 3 months and they still help she says... She continues to f/u w/ CARDS- DrKoneswaran every 3-4 months for her CAD, hx non-STEMI, modMS & mild AS along w/ severe pulmHTN, and prev cath w/ severe 3 vessel dis for which she refused to consider surg... She has a remote hx of Sarcoidosis (inactive) and AB (w/o recent exac)... Prob list as above & meds as follows>>     On ASA81, Effient5/d, Metoprolol25Bid, Lasix40; BP= 120/76 & she is sedentary but appears stable overall; BNP= 978 (improved) w/ Cr=1.24...    On Crestor10 and last FFirebaughin 2013 looked good- reminded to ret FASTING for f/u FLP...    On Amaryl 173m taking 1/2 tab daily; Labs 2/16 showed BS=126, A1c=6.7 and rec to continue same...    On Synthroid505md and labs 2/16 showed TSH= 3.47 and rec to continue same...    On Protonix40 and she denies abd pain, n/v, c/d, blood seen...    Other meds include: Vits, Fe, Elavil 83m31m Atarax 25mg77m...  We reviewed prob list, meds, xrays and labs> see below for updates >> she had the 2015 flu vaccine last Oct...  LABS 2/16:  Chems- ok w/ BS=126, A1c=6.7, Cr=1.24;  CBC- wnl w/ Hg= 13.5;  TSH=3.47;  BNP=978 (improved)...   ~  September 12, 2014:  76mo R104mo recheck> [name] hBethena Roysards f/u [id number] Chiquitoen> CAD, hx NSTEMI cath w/ severe stenosis in proxLAD stent & complex modCAD in Circ & RCA (she refused CABG), on DAPT (Plavix nonresponder on Effient); she also has mild AS & modMS, and severe pulmHTN on oxygen at 2L/min; she had some increased edema & CXR showed right effusion so they increased her Lasix to 80mg/d19mhe is improved having diuresed 11# down to 166# today...  We reviewed prob list, meds, xrays and labs> see below for updates >>   CXR 08/2014 showed cardiomeg, mitral annular calcif & Ao ectasia, prom hilae & sl increased interstitial markings, improved w/ decr edema...  LABS 4/16:  Chems- BS=128, BUN=31, Cr=1.31;  BNP= 1324 on Lasix80mg/d.71mLAN>>  rec to continue the Lasix at 80mg/d, 37malt, elev legs, etc...           Problem List:  Hx of ASTHMATIC BRONCHITIS, ACUTE (ICD-466.0) - uses Mucinex as needed... she has chr stable DOE w/o change... she say she walks "some" but is obviously too sedentary due to her arthritis... she has PNEUMOVAX several yrs ago, and gets the yearly flu vaccine...  Hx of SARCOIDOSIS (ICD-135) - initially  diagnosed in 1992 w/ CXR showing diffuse ILDis, +Gallium scan, ACE=61, & Bronch w/ bx showing non-caseating granulomas... treated w/ Pred & weaned off w/ no active disease since then... last CTChest 7/04 w/ ca++ Ao & coronaries, +mediastinal fat, centilobular emphysema, some scarring... stable without new symptoms of cough, sputum, CP or dyspnea... ~  CXR 1/09 showed stable chr lung changes & prom right hilum... ~  CXR 2/11 showed diffuse ILDz, no adenopathy, NAD... ~  CXRMarland Kitchen2/12 showed borderline cardiomeg, tort Ao, sl incr markings as before, NAD... ~  CXRMarland Kitchen10/12 in hosp  showed cardiomeg, calcif in Ao, vasc congestion w/ pulm edema & sm effusions... ~  CXR 7/13 in hosp showed cardiomegaly & mild vasc congestion... ~  CXR 10/13 showed stable heart size, diffuse mitral annular calcif, clear lungs, DJD in TSpine. ~  CXR 11/14 showed cardiomegaly, pulm vasc congestion, prom right hilum, boney demineralization. ~  njo signs of any dis activity...  HYPERTENSION (ICD-401.9) >>  ~  controlled on meds: METOPROLOL 61m- 1.5 tabsBid, AMLODIPINE 2.537md, AVAPRO 15079m, LASIX 70m81m.. ~  Myoview 12/05 was neg- no ischemia, no infarct... ~  EKG 2/11 showed NSR, rate 82/min, WNL... ~[name] 4/12:  DrHochrein increased her Metoprolol from 25Bid to 50Bid for her BP & ectopy... ~  6/12:  BP= 136/74 & denies HA, visual changes, CP, palipit, syncope, change in dyspnea, edema, etc... ~  3/13:  BP= 124/70 & she is feeling well, denies CP/ SOB/ ch in edema/ etc... ~  7/13:  BP= 124/60 & these meds were not changed during her recent hosp for angina w/ cath & PCI for LAD in-stent restenosis... ~  8/13:  BP= 130/60 & she denies CP, palpit, ch in SOB or edema... ~  11/13:  BP= 124/56 on same 4med70mgimen & she denies CP, palpit, SOB, edema... ~  2/14: on Metop50-1.5tabsBid, Amlod2.5, Avapro150, Lasix40; BP=118/72 & she is feeling ok- denies current CP, palpit, ch in SOB, edema, etc. ~  4/14: on Metop50-decr to 1AM & 1/2PM,  Lasix40, & off Amlod & Avapro; BP=120/72 & she is feeling ok- denies current CP, palpit, ch in SOB, edema, etc. ~  6/15: [facility], seen 4/15 for her HBP, ASHD-s/p MI, Chr diastolicCHF, AFib, & HL> on Metop50Bid, Lasix40, ASA81, Effient5; she has refused CABG, and is a Plavix nonresponder & INTOL to Imdur; BP= 118/76, no changes made & he checks her Q3-11mo..35mo  2/16: she remains under the care of DrKoneswaran> on ASA81, Effient5/d, Metoprolol25Bid, Lasix40; BP= 120/76 & she is sedentary but appears stable overall; BNP= 978 (improved) w/ Cr=1.24...  VALVULAR HEART DISEASE>  PALPITATIONS & Ventric Ectopy>  Eval & Rx by DrHochrein, his notes are reviewed...  ~  On METOPROLOL 75mgBi107mshe uses HYDROXYZINE (Vistaril) 25mg Pr25mich she says is helpful... ~  Event Monitor done 4/12 and no definite AFib encountered, just PVCs...  ~  EKG shows just some PACs, otherw neg... ~  2DEcho 10/13 showed mild LVH, norm LVF w/ EF=65-70%; AoV w/ mildly thickened & calcif leaflets; heavy mitral annular calcif, mod thickened leaflets, & modMR... ~  2DEcho 12/14 showed severeLVH, hyperdynamic LV w/ EF=65-70%, mild intracavity gradient, mildAS, mild to mod MS, severely dil LA&RA, & severe pulmHTN w/ PAsys=75...  CORONARY ARTERY DISEASE >> on ASA 81mg/d &85mIENT5mg (off 28mvix now); plus the meds above & followed by DrRothbart... ~  She had nonQ-MI 10/12 & cath showed severe 3 vessel CAD w/ PTCA/ stent placed in LAD (99% occluded) + med rx w/ Plavix added; there was AK anteriorly but LVF was preserved... ~  EKG 11/12 showed NSR, rate85, PACs, septal infarct, otherw neg... ~  DrRothbart did a Myoview scan 3/13 w/ SVT developing after the infusion w/ CP & abn STsegment changes; she spont reverted to NSR w/o need for intervention; Myoview images showed scar anteroseptal area + soft tissue attenuation & minor ischemia at the apex & EF=77%; They incr her Metoprolol to 75mgBid...18m7/13:  [facility]/ [facility] showing 99%  prox LAD in-stent restenosis- PCI w/ cutting ballon & improved; she had mod dis in RCA & Circ (including worsening 90% distal RCA stenosis- but intervention was r/o)... EKG showed NSR, PACs, rate66, otherw wnl, NAD... ~  10/13:  She was [facility] w/ NSTEMI & cath showed in-stent restenosis LAD- s/p cutting balloon angioplasty; she also had resid dis in RCA & CIRC- continue med rx;  They also noted HBP w/ hypotension this adm, asymptomatic SVT on tele, valv heart dis w/ thickened/ calcified AoV & modMR, mod carotid stenoses, mild renal insuffic... Followed by DrRothbart & cardiology notes reviewed & Med Rec performed... ~  2/14: on ASA81, Effient5, & off Plavix; Hosp 10/13 as above, now improved; she saw CardsPA 11/13- stable, continue same meds... ~  4/14: on ASA81, Effient5 & off Plavix; Hosp 10/13 & 3/14 w/ NSTEMI & PTCA/ DES to LAD- she has refused CABG; also had AFib & they decided on rate control; she had f/u OV Cards 4/14- continue duel antiplat Rx w/ ASA & Effient, meds adjusted. ~  6/15: She is followed by Eleonore Chiquito for Cards, seen 4/15 for her HBP, ASHD-s/p MI, Chr diastolicCHF, AFib, & HL> on Metop50Bid, Lasix40, ASA81, Effient5; she has refused CABG, and is a Plavix nonresponder & INTOL to Imdur; no changes made & he checks her Q3-32mo..  ~  2/16: 2/16: she remains under the care of DrKoneswaran> on ASA81, Effient5/d, Metoprolol25Bid, Lasix40; BP= 120/76 & she is sedentary but appears stable overall; BNP= 978 (improved) w/ Cr=1.24... ~  3/16: Cards f/u [id number] DEleonore Chiquitoin [place]> CAD, hx NSTEMI cath w/ severe stenosis in proxLAD stent & complex modCAD in Circ & RCA (she refused CABG), on DAPT (Plavix nonresponder on Effient); she also has mild AS & modMS, and severe pulmHTN on oxygen at 2L/min; she had some increased edema & CXR showed right effusion so they increased her Lasix to 826md & she is improved having diuresed 11# down to 166# by 4/16...    PERIPHERAL VASCULAR DISEASE (ICD-443.9) - on ASA 81, EFFIENT5... doing well w/o claud symptoms or cerebral ischemic symptoms... ~  Art Dopplers of LE's 6/06 showed biphasic waveforms in ant tib & peroneal arts, normal ABI's... ~  CDopplers 5/12 showed mild heterogeneous plaque bilat w/ 40-59% bilat ICA stenoses... ~  CDopplers 4/13 showed mild to mod plaque bilat, 50-69% bilat ICA stenoses (lower end but R>L)...  VENOUS INSUFFICIENCY (ICD-459.81) - on low sodium diet and the Lasix... she knows to elevate legs, wear support hose, etc... ~  7/13:  She has persistent bilat LE edema & weight is similar 192# on LASIX 4016m   HYPERLIPIDEMIA (ICD-272.4) - on SIMVASTATIN 88m16m.. ~  FLP [place]8 showed TChol 121, TG 120, HDL 46, LDL 51... looks great- continue Simva40. ~  FLP [place]9 showed TChol 137, TG 165, HDL 37, LDL 67 ~  FLP 9/09 showed TChol 133, TG 141, HDL 47, LDL 57 ~  FLP 5/10 showed TChol 131, TG 190, HDL 46, LDL 47 ~  FLP 2/11 showed TChol 129, TG 129, HDL 52, LDL 51 ~  FLP 2/12 showed TChol 134, TG 159, HDL 49, LDL 53 ~  FLP 3/13 on Simva40 showed TChol 112, TG 157, HDL 46, LDL 35... Continue same + better low fat diet. ~  FLP 10/13 on Cres10 showed TChol 116, TG 97, HDL 46, LDL 51... Changed to Cres10 during 10/13 adm... ~  She is asked to ret FASTING for FLP f/u...  DM (ICD-250.00) - on diet + GLIMEPIRIDE =>  see below... ~  labs 1/08 showed BS=130 and HgA1c=6.1 ~  labs 1/09 showed BS= 137, HgA1c= 6.2... diet and exercise are the keys!!! ~  labs 9/09 showed BS= 119, HgA1c= 6.3 ~  labs 5/10 showed BS= 134, A1c= 6.3 ~  labs 2/11 showed BS= 122, A1c= 6.3 ~  labs 6/11 showed BS= 106 ~  Labs 2/12 showed BS= 138, A1c= 6.3 ~  Labs 3/13 on Metform500Bid showed BS= 126, A1c= 6.0.Marland KitchenMarland Kitchen Continue same, get wt down! ~  7/13:  Metformin was held 7/13 Hosp w/ Creat= 0.9-1.2 & GFR= 40-50 range; we decided to restart METFORMIN 520m Qam only... ~  Labs 8/13 on Metform500 showed BS= 118 ~  Labs 10/13  in hosp showed BS=113, A1c=6.3 ~  2/14: on Glimep181m off Metform due to mild RI; BS=85 & A1c=6.1; we decided to decr the Glimep1m79mo 1/2 tab/d. ~  4/14: she didn't decr the Glimep as prev discussed> asked to decr the Glimep1mg39m 1/2 tab Qam... ~  Labs 5/14 on Glim1mg-28m daily and BS=100 ~  6/15: DM> on Glimep1mg- 39ming 1/2 tab qam; last labs 3/15 showed BS=109, A1c=6.6; advised same med, better diet, continue wt loss efforts (down 8# to 180# today)..  ~  2/16: On Amaryl 1mg- t30mng 1/2 tab daily; Labs 2/16 showed BS=126, A1c=6.7 and rec to continue same..  HYPOMarland KitchenHYROID >>  ~  Routine labs 5/14 showed TSH= 15.48 & we started SYNTHROID 50mcg/d17m~  Follow up Labds 8/14 on Synthroid50 showed TSH= 2.80, continue same... ~  6/15: on Synthroid50 & last TSH was 3/15 = 1.82, stable, continue same... ~  2/16: On Synthroid50mcg/d 2mlabs 2/16 showed TSH= 3.47 and rec to continue same.  OBESITY (ICD-278.01) - prev wt 227#  & _0  tall for a BMI=41-42...  ~  weight 1/10 = 217# ~  weight 5/10 = 215# ~  weight 9/10 = 210# ~  weight 2/11 = 210# ~  weight 6/11 = 203# ~  weight 10/11 = 203# ~  Weight 6/12 = 195# ~  Weight 3/13 = 192# ~  Weight 7/13 = 192# ~  Weight 8/13 = 188# ~  Weight 11/13 = 187# ~  Weight 2/14 = 188# ~  Weight 5/14 = 186# ~  Weight 8/14 = 188# ~  Weight 6/15 = 180# ~  Weight 2/16 = 177# ~  Weight 4/16 = 166#  GERD (ICD-530.81) - pt was placed on PROTONIX 40mgBid 747mdue to reflux symptoms and Anemia... ~  She is encouraged to f/u w/ GI & she indicates that she will call DrRehman when she is ready...  DIVERTICULOSIS, COLON (ICD-562.10) - had colonoscopy by DrDBrodie 1997 that was neg x divertics... now followed in [place]...  IRRITABLE BOWEL SYNDROME (ICD-564.1)  RENAL INSUFFICIENCY >> Creat ~1.2 and GFR ~ 40-50 range...  DEGENERATIVE JOINT DISEASE, ADVANCED (ICD-715.90) - she has mod severe bilat knee pain and sees DrHarrison, Ortho for shots "every three  months"... she is opposed to surgery (see his EMR notes- reviewed)...  VITAMIN D DEFICIENCY (ICD-268.9) ~  labs 5/10 showed Vit D level =13... therefore rec Vit D 50000 u weekly. ~  labs 2/11 showed Vit D level = 32... OK to change to VitD 1000 u daily.. ~  BMD done 6/11 showed TScores -1.3 in Spine, and -2.5 in left FemNeck... rec> start Alendronate 70mg/wk [id number] refuses Bisphos Rx "I'll take my chances"...  FIBROMYALGIA (ICD-729.1)  ANXIETY (ICD-300.00) - we prev perscribed HYDROXYZINE PAMOATE 25mg Prn fo73mhroat closing"  symptoms... ~  7/13:  She admits that "my nerves are shot" after recent [facility] & Rx LORAZEPAM 0.67m- 1/2 to 1 tab Tid as needed... ~  2/14:  She stopped the Ativan on her own & now using just the ELAVIL 19m& HYDROXYZINE 2564mrn...  ANEMIA (ICD-285.9) - hx mild anemia w/ Hg = 10.8 Jan09 & started Fe daily at that time...  ~  labs 5/09 showed Hg= 11.7, Fe= 64 ~  labs 9/09 showed Hg= 11.4, Fe= 43... rec- take FeSO4 w/ Vit C 500m4m ~  labs 5/10 showed Hg= 11.3,  Fe= 50 ~  labs 2/11 showed Hg= 10.7, Fe= 36 (12%)... rec> incr Fe Vit C to Bid. ~  labs 6/11 showed Hg= 10.9, Fe= 58 (19%sat)... continue Bid Fe ~  Labs 2/12 showed Hg= 11.6, Fe= 51 (16%sat)... Continue same. ~  Labs 3/13 showed Hg= 11.3... CMarland KitchenMarland Kitchentinue FeSO4 Bid... ~  Labs during 7/13 Hosp showed Hg= 8-9 range... rec to continue FeSO4 Bid... ~  Labs 8/13 showed Hg= 10.7 and Fe= 45 (16%)... Ok to decr Fe to one daily... ~  Labs 10/13 in hosp showed Hg= 10.1 ~  2/14: on Fe, VitC, VitB12; Hg=11.1, Fe=79 (27%sat); continue supplements. ~  4/14: on Fe, VitC, VitB12; Hg= 9-10 range; continue supplements... ~  5/14: on Fe, VitC, VitB12, etc; Labs shows Hg= 11.8 & Fe= 42 (13%); Rec to continue Fe/ VitC etc ~  6/15: she remains on Fe, VitC, VitB12, etc... ~  2/16: on Fe, Vit B12, C, D> labs showed Hg= 13.5& MCV= 93...  INTERMITTENT VERTIGO (ICD-780.4) - prev eval DrWeymann w/ Meclizine Prn...   Past Surgical History   Procedure Laterality Date  . Cholecystectomy    . Tonsillectomy    . Hernia repair    . Cataract extraction    . Coronary angioplasty with stent placement  03/21/2012  . Left heart catheterization with coronary angiogram N/A 12/01/2011    Procedure: LEFT HEART CATHETERIZATION WITH CORONARY ANGIOGRAM;  Surgeon: Peter M JordMartinique;  [facility];  Service: Cardiovascular;  Laterality: N/A;  . Percutaneous coronary stent intervention (pci-s)  12/01/2011    Procedure: PERCUTANEOUS CORONARY STENT INTERVENTION (PCI-S);  Surgeon: Peter M JordMartinique;  [facility];  Service: Cardiovascular;;  . Left heart catheterization with coronary angiogram N/A 03/21/2012    Procedure: LEFT HEART CATHETERIZATION WITH CORONARY ANGIOGRAM;  Surgeon: DaltLarey Dresser;  [facility];  Service: Cardiovascular;  Laterality: N/A;  . Percutaneous coronary stent intervention (pci-s)  03/21/2012    Procedure: PERCUTANEOUS CORONARY STENT INTERVENTION (PCI-S);  Surgeon: ChriBurnell Blanks;  [facility];  Service: Cardiovascular;;  . Left heart catheterization with coronary angiogram N/A 08/18/2012    Procedure: LEFT HEART CATHETERIZATION WITH CORONARY ANGIOGRAM;  Surgeon: ChriBurnell Blanks;  [facility];  Service: Cardiovascular;  Laterality: N/A;  . Percutaneous coronary stent intervention (pci-s)  08/18/2012    Procedure: PERCUTANEOUS CORONARY STENT INTERVENTION (PCI-S);  Surgeon: ChriBurnell Blanks;  [facility];  Service: Cardiovascular;;   des to prox lad with ivus and cutting balloon to existing lad stent     Outpatient Encounter Prescriptions as of 09/12/2014  Medication Sig  . acetaminophen (TYLENOL) 500 MG tablet Take 1 tablet (500 mg total) by mouth every 6 (six) hours as needed for pain.  . amMarland Kitchentriptyline (ELAVIL) 10 MG tablet TAKE (1) TABLET BY MOUTH ONCE DAILY.  . asMarland Kitchenorbic acid (VITAMIN C) 500 MG  tablet Take 500 mg by mouth 2 (two)  times daily. Twice daily with iron tablet  . aspirin EC 81 MG tablet Take 81 mg by mouth every morning.   . cholecalciferol (VITAMIN D) 1000 UNITS tablet Take 1,000 Units by mouth daily.  . CRESTOR 10 MG tablet TAKE (1) TABLET BY MOUTH ONCE DAILY.  [name] econazole nitrate 1 % cream Apply 1 application topically daily as needed. FOR FEET IRRITATION  . EFFIENT 5 MG TABS tablet TAKE (1) TABLET BY MOUTH ONCE DAILY.  . ferrous sulfate 324 (65 FE) MG TBEC Take 1 tablet by mouth 2 (two) times daily.   . furosemide (LASIX) 80 MG tablet Take 1 tablet (80 mg total) by mouth daily.  [name] glimepiride (AMARYL) 1 MG tablet TAKE 1/2 TABLET DAILY.  [name] glucose blood (ONE TOUCH TEST STRIPS) test strip Test once daily. DX;  250.00  . guaiFENesin (MUCINEX) 600 MG 12 hr tablet Take 600 mg by mouth 2 (two) times daily.  . hydrOXYzine (ATARAX/VISTARIL) 25 MG tablet TAKE 1 TABLET EVERY 6 HOURS AS NEEDED FOR ITCHING OR ANXIETY.  [name] levothyroxine (SYNTHROID, LEVOTHROID) 50 MCG tablet TAKE 1 TABLET ONCE A DAY BEFORE BREAKFAST.  [name] meclizine (ANTIVERT) 25 MG tablet TAKE 1 TABLET 3 TIMES A DAY AS NEEDED FOR DIZZINESS.  . metoprolol tartrate (LOPRESSOR) 25 MG tablet TAKE (1) TABLET TWICE DAILY.  . nitroGLYCERIN (NITROSTAT) 0.4 MG SL tablet Place 1 tablet (0.4 mg total) under the tongue every 5 (five) minutes as needed for chest pain (up to 3 doses).  [name] ONE TOUCH LANCETS MISC Test once daily. Dx:  250.00  . pantoprazole (PROTONIX) 40 MG tablet TAKE (1) TABLET TWICE DAILY.  . vitamin B-12 (CYANOCOBALAMIN) 1000 MCG tablet Take 1,000 mcg by mouth every morning.    Allergies  Allergen Reactions  . Codeine Other (See Comments)    Reaction: severe GI pain  . Diazepam Other (See Comments)    REACTION: causes pt unable to wake up after taking  . Lipitor [Atorvastatin Calcium] Other (See Comments)    Reaction: causes vertigo  . Penicillins Other (See Comments)    Reaction: unknown (childhood )  . Plavix [Clopidogrel Bisulfate]      Nonresponder  . Sulfonamide Derivatives Other (See Comments)    Reaction: unknown (childhood)  . Mango Flavor Rash    Current Medications, Allergies, Past Medical History, Past Surgical History, Family History, and Social History were reviewed in [employer] record.   Review of Systems        See HPI - all other systems neg except as noted... The patient complains of decreased hearing, dyspnea on exertion, peripheral edema, muscle weakness, and difficulty walking.  The patient denies anorexia, fever, weight loss, weight gain, vision loss, hoarseness, chest pain, syncope, prolonged cough, headaches, hemoptysis, abdominal pain, melena, hematochezia, severe indigestion/heartburn, hematuria, incontinence, suspicious skin lesions, transient blindness, depression, unusual weight change, abnormal bleeding, enlarged lymph nodes, and angioedema.   Objective:   Physical Exam      WD, Obese, 81 y/o WF in NAD... GENERAL:  Alert & oriented; pleasant & cooperative... she is very talkative... HEENT:  [place]/AT, Glasses, EOM-full, PERRLA, TMs-wax, NOSE-clear, THROAT-clear & wnl. NECK:  Supple w/ fairROM; no JVD; no carotid bruits; no thyromegaly or nodules palpated; no lymphadenopathy. CHEST:  decr BS bilat but clear w/o wheezing, rales or signs of consolidation... HEART:   sl irregular rhythm; without murmurs/ rubs/ or gallops detected... ABDOMEN:  Obese w/ panniculus, soft & nontender; normal bowel  sounds; no organomegaly or masses palpated. EXT:  mod arthritic changes; no varicose veins/ +venous insuffic/ tr+edema bilat NEURO:  CN's intact, no focal neuro deficits... DERM:  few ecchymoses, no rash etc...  RADIOLOGY DATA:  Reviewed in the EPIC EMR & discussed w/ the patient...  LABORATORY DATA:  Reviewed in the EPIC EMR & discussed w/ the patient...   Assessment & Plan:    AB>  Breathing is stable w/o exac, on continuous O2...  Hx Sarcoid>  Old CXR w/ chr changes,  NAD...  CAD>  on ASA81, Effient5 & off Plavix; [place] 10/13, 3/14, 12/14 as noted- w/ NSTEMI & PTCA/ DES to LAD- she has refused CABG; also had AFib & they decided on rate control; she had f/u OV Cards 4/14- continue duel antiplat Rx w/ ASA & Effient, meds adjusted, followed by Eleonore Chiquito... CWP>  We discussed rest, heat, Tramadol prn...  HBP>  Controlled on meds, continue same, take regularly...  Palpit, AFib>  On going eval & med adjustments from [facility]; better on the incr Metoprolol but notes that the Vistaril really helps too...  PVD>  On ASA & Effient now w/o cerebral ischemic symptoms; CDoppler 4/13 w/ sl progression- continue meds...  Ven Insuffic/ Edema>  We reviewed the nec sodium restriction and continue the Lasix80...  HYPERLIPID>  On Cres10 & due for f/u FLP on ret...  DM>  On Glimep58m- 1/2 daily & we will continue same for now...  Hypothyroid>  TSH looks good on Synthroid50- continue same...  GI>  GERD, Divertics, IBS>  Stable, continue Protonix40Bid...  DJD>  [place]  [place]>  Off Lorazepam, on Elavil & Atarax...   Patient's Medications  New Prescriptions   No medications on file  Previous Medications   ACETAMINOPHEN (TYLENOL) 500 MG TABLET    Take 1 tablet (500 mg total) by mouth every 6 (six) hours as needed for pain.   AMITRIPTYLINE (ELAVIL) 10 MG TABLET    TAKE (1) TABLET BY MOUTH ONCE DAILY.   ASCORBIC ACID (VITAMIN C) 500 MG TABLET    Take 500 mg by mouth 2 (two) times daily. Twice daily with iron tablet   ASPIRIN EC 81 MG TABLET    Take 81 mg by mouth every morning.    CHOLECALCIFEROL (VITAMIN D) 1000 UNITS TABLET    Take 1,000 Units by mouth daily.   CRESTOR 10 MG TABLET    TAKE (1) TABLET BY MOUTH ONCE DAILY.   ECONAZOLE NITRATE 1 % CREAM    Apply 1 application topically daily as needed. FOR FEET IRRITATION   EFFIENT 5 MG TABS TABLET    TAKE (1) TABLET BY MOUTH ONCE DAILY.   FERROUS SULFATE 324 (65 FE) MG TBEC    Take 1  tablet by mouth 2 (two) times daily.    FUROSEMIDE (LASIX) 80 MG TABLET    Take 1 tablet (80 mg total) by mouth daily.   GLIMEPIRIDE (AMARYL) 1 MG TABLET    TAKE 1/2 TABLET DAILY.   GLUCOSE BLOOD (ONE TOUCH TEST STRIPS) TEST STRIP    Test once daily. DX;  250.00   GUAIFENESIN (MUCINEX) 600 MG 12 HR TABLET    Take 600 mg by mouth 2 (two) times daily.   HYDROXYZINE (ATARAX/VISTARIL) 25 MG TABLET    TAKE 1 TABLET EVERY 6 HOURS AS NEEDED FOR ITCHING OR ANXIETY.   LEVOTHYROXINE (SYNTHROID, LEVOTHROID) 50 MCG TABLET    TAKE 1 TABLET ONCE A DAY BEFORE BREAKFAST.   MECLIZINE (ANTIVERT) 25 MG  TABLET    TAKE 1 TABLET 3 TIMES A DAY AS NEEDED FOR DIZZINESS.   METOPROLOL TARTRATE (LOPRESSOR) 25 MG TABLET    TAKE (1) TABLET TWICE DAILY.   NITROGLYCERIN (NITROSTAT) 0.4 MG SL TABLET    Place 1 tablet (0.4 mg total) under the tongue every 5 (five) minutes as needed for chest pain (up to 3 doses).   ONE TOUCH LANCETS MISC    Test once daily. Dx:  250.00   PANTOPRAZOLE (PROTONIX) 40 MG TABLET    TAKE (1) TABLET TWICE DAILY.   VITAMIN B-12 (CYANOCOBALAMIN) 1000 MCG TABLET    Take 1,000 mcg by mouth every morning.  Modified Medications   No medications on file  Discontinued Medications   No medications on file

## 2014-09-26 ENCOUNTER — Ambulatory Visit: Payer: Medicare Other | Admitting: Cardiovascular Disease

## 2014-10-16 ENCOUNTER — Ambulatory Visit: Payer: Medicare Other | Admitting: Orthopedic Surgery

## 2014-11-06 ENCOUNTER — Telehealth: Payer: Self-pay | Admitting: Cardiovascular Disease

## 2014-11-06 NOTE — Telephone Encounter (Signed)
Patient has questions about her Crestor

## 2014-11-07 NOTE — Telephone Encounter (Signed)
Wanted to discuss the cost since going generic.  Stated she had to pay $85.83 for 30 day supply.  Was paying $34.00 at [facility].  Informed patient that I will call pharmacy to see what going on with insurance.

## 2014-11-08 ENCOUNTER — Other Ambulatory Visit: Payer: Self-pay | Admitting: Pulmonary Disease

## 2014-11-12 ENCOUNTER — Other Ambulatory Visit: Payer: Self-pay | Admitting: Cardiovascular Disease

## 2014-11-13 ENCOUNTER — Telehealth: Payer: Self-pay | Admitting: Orthopedic Surgery

## 2014-11-13 ENCOUNTER — Ambulatory Visit (INDEPENDENT_AMBULATORY_CARE_PROVIDER_SITE_OTHER): Payer: Medicare Other | Admitting: Orthopedic Surgery

## 2014-11-13 VITALS — BP 110/67 | Ht 63.0 in | Wt 166.0 lb

## 2014-11-13 DIAGNOSIS — M170 Bilateral primary osteoarthritis of knee: Secondary | ICD-10-CM

## 2014-11-13 NOTE — Telephone Encounter (Signed)
Patient called at approximately 4:00pm today, 11/13/14, to relay that there was bleeding in one of her knees that she noticed shortly after the injection.  Per our nurse conversation with patient, she  then spoke with Dr Romeo Apple; he advised to have patient come immediately back to office.  I called directly back to patient to relay this information, and patient stated that she cannot come back to office this evening.  States she is very tired and already in bed, and that her caregiver/friend was helping to apply some pressure to the area, and that the bleeding was stopping.  I relayed to patient that if any recurrence tonight, to go directly to the Emergency Room.

## 2014-11-13 NOTE — Progress Notes (Signed)
Patient ID: Alexandra [name], female   DOB: 1923-08-13, 81 y.o.   MRN: [id number]   Chief Complaint  Patient presents with  . Follow-up    2 month follow up + repeat bilateral knee injections     Chief Complaint  Patient presents with  . Follow-up    2 month follow up + repeat bilateral knee injections   Procedure note left knee injection verbal consent was obtained to inject left knee joint  Timeout was completed to confirm the site of injection  The medications used were 40 mg of Depo-Medrol and 1% lidocaine 3 cc  Anesthesia was provided by ethyl chloride and the skin was prepped with alcohol.  After cleaning the skin with alcohol a 20-gauge needle was used to inject the left knee joint. There were no complications. A sterile bandage was applied.    Procedure note right knee injection verbal consent was obtained to inject right knee joint  Timeout was completed to confirm the site of injection  The medications used were 40 mg of Depo-Medrol and 1% lidocaine 3 cc  Anesthesia was provided by ethyl chloride and the skin was prepped with alcohol.  After cleaning the skin with alcohol a 20-gauge needle was used to inject the right knee joint. There were no complications. A sterile bandage was applied.

## 2014-11-14 ENCOUNTER — Encounter (HOSPITAL_COMMUNITY): Payer: Self-pay

## 2014-11-14 ENCOUNTER — Ambulatory Visit: Payer: Medicare Other | Admitting: Pulmonary Disease

## 2014-11-14 ENCOUNTER — Emergency Department (HOSPITAL_COMMUNITY)
Admission: EM | Admit: 2014-11-14 | Discharge: 2014-11-14 | Disposition: A | Discharge status: Home / Self Care | Payer: Medicare Other | Attending: Emergency Medicine | Admitting: Emergency Medicine

## 2014-11-14 DIAGNOSIS — Z9981 Dependence on supplemental oxygen: Secondary | ICD-10-CM | POA: Diagnosis not present

## 2014-11-14 DIAGNOSIS — E559 Vitamin D deficiency, unspecified: Secondary | ICD-10-CM | POA: Insufficient documentation

## 2014-11-14 DIAGNOSIS — I2510 Atherosclerotic heart disease of native coronary artery without angina pectoris: Secondary | ICD-10-CM | POA: Insufficient documentation

## 2014-11-14 DIAGNOSIS — Z862 Personal history of diseases of the blood and blood-forming organs and certain disorders involving the immune mechanism: Secondary | ICD-10-CM | POA: Insufficient documentation

## 2014-11-14 DIAGNOSIS — K219 Gastro-esophageal reflux disease without esophagitis: Secondary | ICD-10-CM | POA: Diagnosis not present

## 2014-11-14 DIAGNOSIS — M25562 Pain in left knee: Secondary | ICD-10-CM | POA: Diagnosis present

## 2014-11-14 DIAGNOSIS — L7622 Postprocedural hemorrhage and hematoma of skin and subcutaneous tissue following other procedure: Secondary | ICD-10-CM | POA: Diagnosis not present

## 2014-11-14 DIAGNOSIS — N182 Chronic kidney disease, stage 2 (mild): Secondary | ICD-10-CM | POA: Diagnosis not present

## 2014-11-14 DIAGNOSIS — Z7982 Long term (current) use of aspirin: Secondary | ICD-10-CM | POA: Insufficient documentation

## 2014-11-14 DIAGNOSIS — R58 Hemorrhage, not elsewhere classified: Secondary | ICD-10-CM

## 2014-11-14 DIAGNOSIS — Z8659 Personal history of other mental and behavioral disorders: Secondary | ICD-10-CM | POA: Insufficient documentation

## 2014-11-14 DIAGNOSIS — E669 Obesity, unspecified: Secondary | ICD-10-CM | POA: Insufficient documentation

## 2014-11-14 DIAGNOSIS — Z79899 Other long term (current) drug therapy: Secondary | ICD-10-CM | POA: Diagnosis not present

## 2014-11-14 DIAGNOSIS — D649 Anemia, unspecified: Secondary | ICD-10-CM | POA: Insufficient documentation

## 2014-11-14 DIAGNOSIS — I129 Hypertensive chronic kidney disease with stage 1 through stage 4 chronic kidney disease, or unspecified chronic kidney disease: Secondary | ICD-10-CM | POA: Diagnosis not present

## 2014-11-14 DIAGNOSIS — Z8739 Personal history of other diseases of the musculoskeletal system and connective tissue: Secondary | ICD-10-CM | POA: Diagnosis not present

## 2014-11-14 DIAGNOSIS — J45909 Unspecified asthma, uncomplicated: Secondary | ICD-10-CM | POA: Insufficient documentation

## 2014-11-14 DIAGNOSIS — I4891 Unspecified atrial fibrillation: Secondary | ICD-10-CM | POA: Insufficient documentation

## 2014-11-14 DIAGNOSIS — E119 Type 2 diabetes mellitus without complications: Secondary | ICD-10-CM | POA: Diagnosis not present

## 2014-11-14 DIAGNOSIS — Z880 Allergy status to penicillin: Secondary | ICD-10-CM | POA: Insufficient documentation

## 2014-11-14 DIAGNOSIS — E785 Hyperlipidemia, unspecified: Secondary | ICD-10-CM | POA: Diagnosis not present

## 2014-11-14 LAB — CBC WITH DIFFERENTIAL/PLATELET
BASOS PCT: 0 % (ref 0–1)
Basophils Absolute: 0 10*3/uL (ref 0.0–0.1)
EOS ABS: 0 10*3/uL (ref 0.0–0.7)
EOS PCT: 0 % (ref 0–5)
HCT: 34.2 % — ABNORMAL LOW (ref 36.0–46.0)
Hemoglobin: 11.1 g/dL — ABNORMAL LOW (ref 12.0–15.0)
Lymphocytes Relative: 6 % — ABNORMAL LOW (ref 12–46)
Lymphs Abs: 0.4 10*3/uL — ABNORMAL LOW (ref 0.7–4.0)
MCH: 30.7 pg (ref 26.0–34.0)
MCHC: 32.5 g/dL (ref 30.0–36.0)
MCV: 94.7 fL (ref 78.0–100.0)
Monocytes Absolute: 0.3 10*3/uL (ref 0.1–1.0)
Monocytes Relative: 4 % (ref 3–12)
Neutro Abs: 6.5 10*3/uL (ref 1.7–7.7)
Neutrophils Relative %: 90 % — ABNORMAL HIGH (ref 43–77)
PLATELETS: 126 10*3/uL — AB (ref 150–400)
RBC: 3.61 MIL/uL — ABNORMAL LOW (ref 3.87–5.11)
RDW: 14.6 % (ref 11.5–15.5)
WBC: 7.1 10*3/uL (ref 4.0–10.5)

## 2014-11-14 LAB — PROTIME-INR
INR: 1.23 (ref 0.00–1.49)
PROTHROMBIN TIME: 15.7 s — AB (ref 11.6–15.2)

## 2014-11-14 LAB — APTT: aPTT: 32 seconds (ref 24–37)

## 2014-11-14 MED ORDER — SILVER NITRATE-POT NITRATE 75-25 % EX MISC
CUTANEOUS | Status: AC
  Filled 2014-11-14: qty 1

## 2014-11-14 NOTE — Telephone Encounter (Signed)
Patient has called this morning about 10:40 stating that she has been up all night attending to the knee bleed, she stated that she wrapped it during the night and when she woke up this morning her bed looked like "someone killed hogs in the bed" she stated that she had someone coming over to take her to [facility] ER.

## 2014-11-14 NOTE — ED Provider Notes (Signed)
CSN: [id number]     Arrival date & time 11/14/14  1140 History   First MD Initiated Contact with Patient 11/14/14 1302     Chief Complaint  Patient presents with  . knee bleeding      (Consider location/radiation/quality/duration/timing/severity/associated sxs/prior Treatment) Patient is a 81 y.o. female presenting with knee pain. The history is provided by the patient (pt complains of bleeding from her left knee after injection yesterday).  Knee Pain Lower extremity pain location: left knee. Injury: no   Pain details:    Quality:  Aching   Severity:  Mild   Onset quality:  Sudden Associated symptoms: no back pain and no fatigue     Past Medical History  Diagnosis Date  . Asthmatic bronchitis   . Sarcoidosis     a. 1990s of lung tx with prednisone temporarily.  [name] HTN (hypertension)   . PVD (peripheral vascular disease)     a. 06/2012 Carotid U/S: < 50% bilat ICA stenosis.  . Hyperlipemia   . DM (diabetes mellitus)   . Obesity   . GERD (gastroesophageal reflux disease)   . Diverticulosis of colon   . IBS (irritable bowel syndrome)   . DJD (degenerative joint disease)     Hip and knee pain; hip bursitis; patellar subluxation  . Vitamin D deficiency disease   . Fibromyalgia   . Anxiety     a. Guaiac neg 03/2012.  . Intermittent vertigo   . Anemia   . Venous insufficiency   . PSVT (paroxysmal supraventricular tachycardia)     a. Prior hx of palpitations - developed SVT during stress Myoview 09/2011. b. Noted on tele 03/2012.  . Valvular heart disease     a. moderately thickened/mildly calcified aortic valve, mod MR 03/20/12  . CKD (chronic kidney disease), stage II   . PAC (premature atrial contraction)   . CAD (coronary artery disease) 03/2011    a. NSTEMI 03/2011 complicated by pulm edema s/p DES to subtotal LAD. b. [place] s/p PTCA/cutting balloon to prox LAD for ISR 12/01/11 (med rx for RCA/Cx dz).  c. NSTEMI 03/2012 s/p cutting angioplasty of ISR of prox LAD 03/21/12  (PLAVIX NONRESPONDER). d. DES to LAD for ISR 07/2012 (pt refused consideration of CABG).  [name] A-fib     a. Noted 07/2012 - not on anticoag due for need for DAPT and age.  . Thrombocytopenia     a. Noted 07/2012.  . On home oxygen therapy     2L/min with ambulation as of 07/2012.   Past Surgical History  Procedure Laterality Date  . Cholecystectomy    . Tonsillectomy    . Hernia repair    . Cataract extraction    . Coronary angioplasty with stent placement  03/21/2012  . Left heart catheterization with coronary angiogram N/A 12/01/2011    Procedure: LEFT HEART CATHETERIZATION WITH CORONARY ANGIOGRAM;  Surgeon: Peter M Swaziland, MD;  Location: [facility] CATH LAB;  Service: Cardiovascular;  Laterality: N/A;  . Percutaneous coronary stent intervention (pci-s)  12/01/2011    Procedure: PERCUTANEOUS CORONARY STENT INTERVENTION (PCI-S);  Surgeon: Peter M Swaziland, MD;  Location: [facility] CATH LAB;  Service: Cardiovascular;;  . Left heart catheterization with coronary angiogram N/A 03/21/2012    Procedure: LEFT HEART CATHETERIZATION WITH CORONARY ANGIOGRAM;  Surgeon: Laurey Morale, MD;  Location: [facility] CATH LAB;  Service: Cardiovascular;  Laterality: N/A;  . Percutaneous coronary stent intervention (pci-s)  03/21/2012    Procedure: PERCUTANEOUS CORONARY STENT INTERVENTION (PCI-S);  Surgeon: Kathleene Hazel,  MD;  Location: [facility] CATH LAB;  Service: Cardiovascular;;  . Left heart catheterization with coronary angiogram N/A 08/18/2012    Procedure: LEFT HEART CATHETERIZATION WITH CORONARY ANGIOGRAM;  Surgeon: Kathleene Hazel, MD;  Location: [facility] CATH LAB;  Service: Cardiovascular;  Laterality: N/A;  . Percutaneous coronary stent intervention (pci-s)  08/18/2012    Procedure: PERCUTANEOUS CORONARY STENT INTERVENTION (PCI-S);  Surgeon: Kathleene Hazel, MD;  Location: [facility] CATH LAB;  Service: Cardiovascular;;   des to prox lad with ivus and cutting balloon to existing lad stent    Family History  Problem Relation  Age of Onset  . Heart failure Father     died at 81  . Leukemia Mother     died at 76   History  Substance Use Topics  . Smoking status: Never Smoker   . Smokeless tobacco: Never Used  . Alcohol Use: No     Comment: social   OB History    No data available     Review of Systems  Constitutional: Negative for appetite change and fatigue.  HENT: Negative for congestion, ear discharge and sinus pressure.   Eyes: Negative for discharge.  Respiratory: Negative for cough.   Cardiovascular: Negative for chest pain.  Gastrointestinal: Negative for abdominal pain and diarrhea.  Genitourinary: Negative for frequency and hematuria.  Musculoskeletal: Negative for back pain.       Oozing blood from needle hole in left knee from injection yesterday  Skin: Negative for rash.  Neurological: Negative for seizures and headaches.  Psychiatric/Behavioral: Negative for hallucinations.      Allergies  Codeine; Diazepam; Lipitor; Penicillins; Plavix; Sulfonamide derivatives; and Mango flavor  Home Medications   Prior to Admission medications   Medication Sig Start Date End Date Taking? Authorizing Provider  acetaminophen (TYLENOL) 500 MG tablet Take 1 tablet (500 mg total) by mouth every 6 (six) hours as needed for pain. 08/22/12  Yes Dayna N Dunn, PA-C  amitriptyline (ELAVIL) 10 MG tablet TAKE (1) TABLET BY MOUTH ONCE DAILY. 08/02/14  Yes [name], MD  ascorbic acid (VITAMIN C) 500 MG tablet Take 500 mg by mouth 2 (two) times daily. Twice daily with iron tablet   Yes Historical Provider, MD  aspirin EC 81 MG tablet Take 81 mg by mouth every morning.    Yes Historical Provider, MD  cholecalciferol (VITAMIN D) 1000 UNITS tablet Take 1,000 Units by mouth daily.   Yes Historical Provider, MD  CRESTOR 10 MG tablet TAKE (1) TABLET BY MOUTH ONCE DAILY. 06/07/14  Yes Laqueta Linden, MD  econazole nitrate 1 % cream Apply 1 application topically daily as needed. FOR FEET IRRITATION   Yes  Historical Provider, MD  EFFIENT 5 MG TABS tablet TAKE (1) TABLET BY MOUTH ONCE DAILY. 04/12/14  Yes Laqueta Linden, MD  ferrous sulfate 324 (65 FE) MG TBEC Take 1 tablet by mouth 2 (two) times daily.    Yes Historical Provider, MD  furosemide (LASIX) 80 MG tablet Take 1 tablet (80 mg total) by mouth daily. 08/08/14  Yes Laqueta Linden, MD  glimepiride (AMARYL) 1 MG tablet TAKE 1/2 TABLET DAILY. 06/07/14  Yes [name], MD  glucose blood (ONE TOUCH TEST STRIPS) test strip Test once daily. DX;  250.00 04/06/14  Yes [name], MD  guaiFENesin (MUCINEX) 600 MG 12 hr tablet Take 600 mg by mouth 2 (two) times daily.   Yes Historical Provider, MD  hydrOXYzine (ATARAX/VISTARIL) 25 MG tablet TAKE 1 TABLET EVERY  6 HOURS AS NEEDED FOR ITCHING OR ANXIETY. 05/04/14  Yes [name], MD  levothyroxine (SYNTHROID, LEVOTHROID) 50 MCG tablet TAKE (1) TABLET DAILY BEFORE BREAKFAST. 11/08/14  Yes [name], MD  meclizine (ANTIVERT) 25 MG tablet TAKE 1 TABLET 3 TIMES A DAY AS NEEDED FOR DIZZINESS.   Yes [name], MD  metoprolol tartrate (LOPRESSOR) 25 MG tablet TAKE (1) TABLET TWICE DAILY. 11/12/14  Yes Laqueta Linden, MD  nitroGLYCERIN (NITROSTAT) 0.4 MG SL tablet Place 1 tablet (0.4 mg total) under the tongue every 5 (five) minutes as needed for chest pain (up to 3 doses). 11/27/13 11/27/14 Yes Antoine Poche, MD  ONE [facility] LANCETS MISC Test once daily. Dx:  250.00 04/06/14  Yes [name], MD  pantoprazole (PROTONIX) 40 MG tablet TAKE (1) TABLET TWICE DAILY. 02/12/14  Yes Laqueta Linden, MD  vitamin B-12 (CYANOCOBALAMIN) 1000 MCG tablet Take 1,000 mcg by mouth every morning.   Yes Historical Provider, MD  metoprolol tartrate (LOPRESSOR) 25 MG tablet TAKE (1) TABLET TWICE DAILY. Patient not taking: Reported on 11/14/2014 09/11/14   Laqueta Linden, MD   BP 102/53 mmHg  Pulse 66  Temp(Src) 98.1 F (36.7 C) (Oral)  Resp 20  Ht 5\' 6"  (1.676 m)  Wt 171 lb (77.565 kg)  BMI 27.61  kg/m2  SpO2 97% Physical Exam  Constitutional: She is oriented to person, place, and time. She appears well-developed.  HENT:  Head: Normocephalic.  Eyes: Conjunctivae and EOM are normal. No scleral icterus.  Neck: Neck supple. No thyromegaly present.  Cardiovascular: Normal rate and regular rhythm.  Exam reveals no gallop and no friction rub.   No murmur heard. Pulmonary/Chest: No stridor. She has no wheezes. She has no rales. She exhibits no tenderness.  Abdominal: She exhibits no distension. There is no tenderness. There is no rebound.  Musculoskeletal: She exhibits no edema.  Puncture wound left knee,  bleeding  Lymphadenopathy:    She has no cervical adenopathy.  Neurological: She is oriented to person, place, and time. She exhibits normal muscle tone. Coordination normal.  Skin: No rash noted. No erythema.  Psychiatric: She has a normal mood and affect. Her behavior is normal.    ED Course  Procedures (including critical care time) Labs Review Labs Reviewed  CBC WITH DIFFERENTIAL/PLATELET - Abnormal; Notable for the following:    RBC 3.61 (*)    Hemoglobin 11.1 (*)    HCT 34.2 (*)    Platelets 126 (*)    Neutrophils Relative % 90 (*)    Lymphocytes Relative 6 (*)    Lymphs Abs 0.4 (*)    All other components within normal limits  PROTIME-INR - Abnormal; Notable for the following:    Prothrombin Time 15.7 (*)    All other components within normal limits  APTT    Imaging Review No results found.   EKG Interpretation None     Procedure:   Silver nitrate used to stop bleeding MDM   Final diagnoses:  Bleeding        Bethann Berkshire, MD 11/14/14 1410

## 2014-11-14 NOTE — Telephone Encounter (Signed)
No answer

## 2014-11-14 NOTE — Discharge Instructions (Signed)
Follow up tomorrow am if still bleeding.

## 2014-11-14 NOTE — ED Notes (Signed)
Pt reports had a steroid injection in left knee by Dr. Romeo Apple yesterday.  Reports had difficulty getting the bleeding to stop.  Reports knee started bleeding again around midnight last night and can't get it to stop.  Pt has dressing to knee at this time.

## 2014-11-14 NOTE — ED Notes (Signed)
MD Zammit at bedside. 

## 2014-11-19 ENCOUNTER — Encounter: Payer: Self-pay | Admitting: Cardiovascular Disease

## 2014-11-19 ENCOUNTER — Ambulatory Visit (INDEPENDENT_AMBULATORY_CARE_PROVIDER_SITE_OTHER): Payer: Medicare Other | Admitting: Cardiovascular Disease

## 2014-11-19 VITALS — BP 121/78 | HR 79 | Ht 66.0 in | Wt 171.0 lb

## 2014-11-19 DIAGNOSIS — N183 Chronic kidney disease, stage 3 unspecified: Secondary | ICD-10-CM

## 2014-11-19 DIAGNOSIS — I2510 Atherosclerotic heart disease of native coronary artery without angina pectoris: Secondary | ICD-10-CM | POA: Diagnosis not present

## 2014-11-19 DIAGNOSIS — R0602 Shortness of breath: Secondary | ICD-10-CM

## 2014-11-19 DIAGNOSIS — I10 Essential (primary) hypertension: Secondary | ICD-10-CM

## 2014-11-19 DIAGNOSIS — I38 Endocarditis, valve unspecified: Secondary | ICD-10-CM

## 2014-11-19 DIAGNOSIS — R5383 Other fatigue: Secondary | ICD-10-CM | POA: Diagnosis not present

## 2014-11-19 DIAGNOSIS — I482 Chronic atrial fibrillation, unspecified: Secondary | ICD-10-CM

## 2014-11-19 DIAGNOSIS — E785 Hyperlipidemia, unspecified: Secondary | ICD-10-CM

## 2014-11-19 DIAGNOSIS — I5032 Chronic diastolic (congestive) heart failure: Secondary | ICD-10-CM | POA: Diagnosis not present

## 2014-11-19 DIAGNOSIS — R600 Localized edema: Secondary | ICD-10-CM

## 2014-11-19 MED ORDER — ATORVASTATIN CALCIUM 40 MG PO TABS: 40.0000 mg | ORAL_TABLET | Freq: Every day | ORAL | Status: AC

## 2014-11-19 NOTE — Progress Notes (Signed)
Patient ID: Alexandra [name], female   DOB: June 11, 1923, 81 y.o.   MRN: [id number]      SUBJECTIVE: Alexandra [name] is an 81 year old woman with CAD, status post hospitalization in March of 2014 with a non-ST elevation MI. She was found to have severe stenosis in the proximal LAD stent and complex CAD with moderately severe disease of the RCA and circumflex. She was offered CABG but refused. Per Dr. Clifton James, attempted PCI of the RCA or circumflex should only be done if she had recurrent symptoms. He felt that PCI would be very difficult given the degree of calcium and tortuosity. She was continued on DAPT (Plavix nonresponder, on Effient). She was not placed on Coumadin or Xarelto for atrial fibrillation in the setting of dual antiplatelet therapy.  Echocardiography on 05-01-2013 revealed hyperdynamic LV systolic function, EF 65-70%, with a mild intracavitary gradient, mild aortic stenosis, mild to moderate mitral stenosis, and severe pulmonary hypertension.  She is chronically on 2L oxygen.  Wt 171 lbs today (170 lbs on 3/29).  Her appetite is somewhat diminished. She would like to switch from Crestor to something generic. She has very little energy and fatigues with 15 feet of walking. She has occasional upper left sided chest pains.  She has a son who lives in [place]. She is here with her friend Rubin Payor.    Review of Systems: As per "subjective", otherwise negative.  Allergies  Allergen Reactions  . Codeine Other (See Comments)    Reaction: severe GI pain  . Diazepam Other (See Comments)    REACTION: causes pt unable to wake up after taking  . Lipitor [Atorvastatin Calcium] Other (See Comments)    Reaction: causes vertigo  . Penicillins Other (See Comments)    Reaction: unknown (childhood )  . Plavix [Clopidogrel Bisulfate]     Nonresponder  . Sulfonamide Derivatives Other (See Comments)    Reaction: unknown (childhood)  . Mango Flavor Rash    Current Outpatient Prescriptions    Medication Sig Dispense Refill  . acetaminophen (TYLENOL) 500 MG tablet Take 1 tablet (500 mg total) by mouth every 6 (six) hours as needed for pain.    [name] amitriptyline (ELAVIL) 10 MG tablet TAKE (1) TABLET BY MOUTH ONCE DAILY. 30 tablet 6  . ascorbic acid (VITAMIN C) 500 MG tablet Take 500 mg by mouth 2 (two) times daily. Twice daily with iron tablet    . aspirin EC 81 MG tablet Take 81 mg by mouth every morning.     . cholecalciferol (VITAMIN D) 1000 UNITS tablet Take 1,000 Units by mouth daily.    . CRESTOR 10 MG tablet TAKE (1) TABLET BY MOUTH ONCE DAILY. 30 tablet 6  . econazole nitrate 1 % cream Apply 1 application topically daily as needed. FOR FEET IRRITATION    . EFFIENT 5 MG TABS tablet TAKE (1) TABLET BY MOUTH ONCE DAILY. 30 tablet 6  . ferrous sulfate 324 (65 FE) MG TBEC Take 1 tablet by mouth 2 (two) times daily.     . furosemide (LASIX) 80 MG tablet Take 1 tablet (80 mg total) by mouth daily. 90 tablet 3  . glimepiride (AMARYL) 1 MG tablet TAKE 1/2 TABLET DAILY. 15 tablet 6  . glucose blood (ONE TOUCH TEST STRIPS) test strip Test once daily. DX;  250.00 50 each 6  . guaiFENesin (MUCINEX) 600 MG 12 hr tablet Take 600 mg by mouth 2 (two) times daily.    . hydrOXYzine (ATARAX/VISTARIL) 25 MG tablet TAKE 1 TABLET EVERY  6 HOURS AS NEEDED FOR ITCHING OR ANXIETY. 30 tablet 6  . levothyroxine (SYNTHROID, LEVOTHROID) 50 MCG tablet TAKE (1) TABLET DAILY BEFORE BREAKFAST. 30 tablet 2  . meclizine (ANTIVERT) 25 MG tablet TAKE 1 TABLET 3 TIMES A DAY AS NEEDED FOR DIZZINESS. 50 tablet 1  . metoprolol tartrate (LOPRESSOR) 25 MG tablet TAKE (1) TABLET TWICE DAILY. 60 tablet 3  . metoprolol tartrate (LOPRESSOR) 25 MG tablet TAKE (1) TABLET TWICE DAILY. 60 tablet 6  . nitroGLYCERIN (NITROSTAT) 0.4 MG SL tablet Place 1 tablet (0.4 mg total) under the tongue every 5 (five) minutes as needed for chest pain (up to 3 doses). 25 tablet 4  . ONE TOUCH LANCETS MISC Test once daily. Dx:  250.00 200 each 6   . pantoprazole (PROTONIX) 40 MG tablet TAKE (1) TABLET TWICE DAILY. 60 tablet 3  . vitamin B-12 (CYANOCOBALAMIN) 1000 MCG tablet Take 1,000 mcg by mouth every morning.     No current facility-administered medications for this visit.    Past Medical History  Diagnosis Date  . Asthmatic bronchitis   . Sarcoidosis     a. 1990s of lung tx with prednisone temporarily.  [name] HTN (hypertension)   . PVD (peripheral vascular disease)     a. 06/2012 Carotid U/S: < 50% bilat ICA stenosis.  . Hyperlipemia   . DM (diabetes mellitus)   . Obesity   . GERD (gastroesophageal reflux disease)   . Diverticulosis of colon   . IBS (irritable bowel syndrome)   . DJD (degenerative joint disease)     Hip and knee pain; hip bursitis; patellar subluxation  . Vitamin D deficiency disease   . Fibromyalgia   . Anxiety     a. Guaiac neg 03/2012.  . Intermittent vertigo   . Anemia   . Venous insufficiency   . PSVT (paroxysmal supraventricular tachycardia)     a. Prior hx of palpitations - developed SVT during stress Myoview 09/2011. b. Noted on tele 03/2012.  . Valvular heart disease     a. moderately thickened/mildly calcified aortic valve, mod MR 03/20/12  . CKD (chronic kidney disease), stage II   . PAC (premature atrial contraction)   . CAD (coronary artery disease) 03/2011    a. NSTEMI 03/2011 complicated by pulm edema s/p DES to subtotal LAD. b. [place] s/p PTCA/cutting balloon to prox LAD for ISR 12/01/11 (med rx for RCA/Cx dz).  c. NSTEMI 03/2012 s/p cutting angioplasty of ISR of prox LAD 03/21/12 (PLAVIX NONRESPONDER). d. DES to LAD for ISR 07/2012 (pt refused consideration of CABG).  [name] A-fib     a. Noted 07/2012 - not on anticoag due for need for DAPT and age.  . Thrombocytopenia     a. Noted 07/2012.  . On home oxygen therapy     2L/min with ambulation as of 07/2012.    Past Surgical History  Procedure Laterality Date  . Cholecystectomy    . Tonsillectomy    . Hernia repair    . Cataract extraction     . Coronary angioplasty with stent placement  03/21/2012  . Left heart catheterization with coronary angiogram N/A 12/01/2011    Procedure: LEFT HEART CATHETERIZATION WITH CORONARY ANGIOGRAM;  Surgeon: Peter M Swaziland, MD;  Location: [facility] CATH LAB;  Service: Cardiovascular;  Laterality: N/A;  . Percutaneous coronary stent intervention (pci-s)  12/01/2011    Procedure: PERCUTANEOUS CORONARY STENT INTERVENTION (PCI-S);  Surgeon: Peter M Swaziland, MD;  Location: [facility] CATH LAB;  Service: Cardiovascular;;  . Left heart  catheterization with coronary angiogram N/A 03/21/2012    Procedure: LEFT HEART CATHETERIZATION WITH CORONARY ANGIOGRAM;  Surgeon: Laurey Morale, MD;  Location: [facility] CATH LAB;  Service: Cardiovascular;  Laterality: N/A;  . Percutaneous coronary stent intervention (pci-s)  03/21/2012    Procedure: PERCUTANEOUS CORONARY STENT INTERVENTION (PCI-S);  Surgeon: Kathleene Hazel, MD;  Location: [facility] CATH LAB;  Service: Cardiovascular;;  . Left heart catheterization with coronary angiogram N/A 08/18/2012    Procedure: LEFT HEART CATHETERIZATION WITH CORONARY ANGIOGRAM;  Surgeon: Kathleene Hazel, MD;  Location: [facility] CATH LAB;  Service: Cardiovascular;  Laterality: N/A;  . Percutaneous coronary stent intervention (pci-s)  08/18/2012    Procedure: PERCUTANEOUS CORONARY STENT INTERVENTION (PCI-S);  Surgeon: Kathleene Hazel, MD;  Location: [facility] CATH LAB;  Service: Cardiovascular;;   des to prox lad with ivus and cutting balloon to existing lad stent     History   Social History  . Marital Status: Widowed    Spouse Name: N/A  . Number of Children: 1  . Years of Education: N/A   Occupational History  . Not on file.   Social History Main Topics  . Smoking status: Never Smoker   . Smokeless tobacco: Never Used  . Alcohol Use: No     Comment: social  . Drug Use: No  . Sexual Activity: No   Other Topics Concern  . Not on file   Social History Narrative   Pt is only child. Husband  Duanne Guess passed away in 10/15/02   Pt has 1 son in [place](his wife is MD doing [employer] )      Filed Vitals:   11/19/14 1346  BP: 121/78  Pulse: 79  Height:  (1.676 m)  Weight: 171 lb (77.565 kg)    PHYSICAL EXAM General: NAD, elderly, frail. Neck: No JVD, no thyromegaly or thyroid nodule.  Lungs: Diminished sounds at right base, no rales.  CV: Nondisplaced PMI. Irregular rhythm, normal S1/S2, no S3, III/VI holosystolic murmur along left sternal border. Trivial pretibial and pedal edema.  Abdomen: Soft, nontender, no distention.  Neurologic: Alert and oriented.  Psych: Normal affect.  Skin: Normal. Musculoskeletal: No gross deformities. Extremities: No clubbing or cyanosis.    ECG: Most recent ECG reviewed.      ASSESSMENT AND PLAN: 1. CAD: Remains on ASA, Crestor, Effient, and metoprolol. I previously obtained Dr. Gibson Ramp opinion about coronary angiography/PCI, understanding that the risk is high. He thought medical management was preferred, but would consider high-risk PCI if deemed necessary. I will arrange for an office visit for her to discuss the possibility of this with him.  2. Atrial fibrillation: Rate is controlled on metoprolol 25 mg bid. No anticoagulation due to being on dual antiplatelet therapy.   3. Hyperlipidemia: On Crestor. Due to her desire to switch to a generic medication, will switch to Lipitor 40 mg.  4. Essential HTN: Well controlled on current therapy. No changes.  5. Chronic diastolic heart failure: Euvolemic and stable. Weight is stable. Continue Lasix 80 mg. BUN 20, SCr 1.29 on 08/10/14.  6. Valvular heart disease with both aortic and mitral stenosis: Stable. Will continue to monitor.  Dispo: f/u 6 months. Will arrange for earlier appt with Dr. Clifton James.   Prentice Docker, M.D., F.A.C.C.

## 2014-11-19 NOTE — Telephone Encounter (Signed)
Patient in office today for 3 month follow up.  Crestor stopped & changed to Lipitor.

## 2014-11-19 NOTE — Patient Instructions (Signed)
   Stop Crestor  Begin Lipitor 40mg  daily - new sent to pharmacy today. Continue all other medications.   Referral to Dr. Sanjuana Kava for severe CAD. Your physician wants you to follow up in: 6 months.  You will receive a reminder letter in the mail one-two months in advance.  If you don't receive a letter, please call our office to schedule the follow up appointment

## 2014-11-20 ENCOUNTER — Telehealth: Payer: Self-pay | Admitting: Cardiovascular Disease

## 2014-11-20 NOTE — Telephone Encounter (Signed)
Mrs. Rowlee called asking to speak to Dr. Purvis Sheffield nurse in regards to her medications.

## 2014-11-21 NOTE — Telephone Encounter (Signed)
Patient called to inform our office that her paperwork say's she is allergic to lipitor. Nurse advised patient that she should go ahead and start the lipitor and if she has side effects after starting it, to contact our office. Patient verbalized understanding of plan.

## 2014-12-13 ENCOUNTER — Other Ambulatory Visit: Payer: Self-pay | Admitting: Cardiovascular Disease

## 2014-12-20 ENCOUNTER — Other Ambulatory Visit: Payer: Self-pay | Admitting: Cardiovascular Disease

## 2014-12-31 ENCOUNTER — Encounter: Payer: Medicare Other | Admitting: Physician Assistant

## 2015-01-01 ENCOUNTER — Encounter: Payer: Self-pay | Admitting: Cardiovascular Disease

## 2015-01-14 ENCOUNTER — Other Ambulatory Visit: Payer: Self-pay | Admitting: Cardiovascular Disease

## 2015-01-31 ENCOUNTER — Other Ambulatory Visit: Payer: Self-pay | Admitting: Pulmonary Disease

## 2015-03-04 ENCOUNTER — Other Ambulatory Visit: Payer: Self-pay | Admitting: Pulmonary Disease

## 2015-03-05 ENCOUNTER — Telehealth: Payer: Self-pay | Admitting: Pulmonary Disease

## 2015-03-05 MED ORDER — AMITRIPTYLINE HCL 10 MG PO TABS: ORAL_TABLET | ORAL | Status: AC

## 2015-03-05 NOTE — Telephone Encounter (Signed)
Rx has been sent in. Pt is aware. Nothing further was needed. 

## 2015-03-14 ENCOUNTER — Other Ambulatory Visit: Payer: Self-pay | Admitting: Cardiovascular Disease

## 2015-03-14 ENCOUNTER — Other Ambulatory Visit: Payer: Self-pay | Admitting: Pulmonary Disease

## 2015-03-25 ENCOUNTER — Ambulatory Visit (INDEPENDENT_AMBULATORY_CARE_PROVIDER_SITE_OTHER): Payer: Medicare Other | Admitting: Adult Health

## 2015-03-25 ENCOUNTER — Encounter: Payer: Self-pay | Admitting: Adult Health

## 2015-03-25 ENCOUNTER — Telehealth: Payer: Self-pay | Admitting: Cardiovascular Disease

## 2015-03-25 VITALS — BP 130/68 | HR 92 | Ht 66.0 in | Wt 173.0 lb

## 2015-03-25 DIAGNOSIS — I10 Essential (primary) hypertension: Secondary | ICD-10-CM

## 2015-03-25 DIAGNOSIS — I5041 Acute combined systolic (congestive) and diastolic (congestive) heart failure: Secondary | ICD-10-CM | POA: Diagnosis not present

## 2015-03-25 DIAGNOSIS — I2510 Atherosclerotic heart disease of native coronary artery without angina pectoris: Secondary | ICD-10-CM

## 2015-03-25 MED ORDER — POTASSIUM CHLORIDE CRYS ER 20 MEQ PO TBCR: 20.0000 meq | EXTENDED_RELEASE_TABLET | Freq: Every day | ORAL | Status: AC

## 2015-03-25 MED ORDER — TORSEMIDE 20 MG PO TABS: ORAL_TABLET | ORAL | Status: DC

## 2015-03-25 NOTE — Telephone Encounter (Signed)
Alexandra [name] called the office today stating that she is having continued swelling in her legs and feet. States that she is having problems trying to get her shoes on. States she has no chest pains or shortness of breath.  She was requesting appointment to be seen. Spoke with Hoover BrunetteGayle Hill, LPN and she advised that it was ok for patient To wait until the 3:30pm appointment [facility].

## 2015-03-25 NOTE — Progress Notes (Signed)
Name: Alexandra [name]    DOB: 10-07-1923  Age: 81 y.o.  MR#: [id number]       PCP:  Michele McalpineNADEL,SCOTT M, MD      Insurance: Payor: MEDICARE / Plan: MEDICARE PART A AND B / Product Type: *No Product type* /   CC:   No chief complaint on file.   VS Filed Vitals:   03/25/15 1537  BP: 130/68  Pulse: 92  Height: 5\' 6"  (1.676 m)  Weight: 173 lb (78.472 kg)  SpO2: 90%    Weights Current Weight  03/25/15 173 lb (78.472 kg)  11/19/14 171 lb (77.565 kg)  11/14/14 171 lb (77.565 kg)    Blood Pressure  BP Readings from Last 3 Encounters:  03/25/15 130/68  11/19/14 121/78  11/14/14 114/65     Admit date:  (Not on file) Last encounter with RMR:  Visit date not found   Allergy Codeine; Diazepam; Lipitor; Penicillins; Plavix; Sulfonamide derivatives; and Mango flavor  Current Outpatient Prescriptions  Medication Sig Dispense Refill  . acetaminophen (TYLENOL) 500 MG tablet Take 1 tablet (500 mg total) by mouth every 6 (six) hours as needed for pain.    [name]. amitriptyline (ELAVIL) 10 MG tablet TAKE (1) TABLET BY MOUTH ONCE DAILY. 30 tablet 5  . ascorbic acid (VITAMIN C) 500 MG tablet Take 500 mg by mouth 2 (two) times daily. Twice daily with iron tablet    . aspirin EC 81 MG tablet Take 81 mg by mouth every morning.     [name]. atorvastatin (LIPITOR) 40 MG tablet Take 1 tablet (40 mg total) by mouth daily. 30 tablet 6  . cholecalciferol (VITAMIN D) 1000 UNITS tablet Take 1,000 Units by mouth daily.    [name]. econazole nitrate 1 % cream Apply 1 application topically daily as needed. FOR FEET IRRITATION    . EFFIENT 5 MG TABS tablet TAKE (1) TABLET BY MOUTH ONCE DAILY. 30 tablet 6  . ferrous sulfate 324 (65 FE) MG TBEC Take 1 tablet by mouth 2 (two) times daily.     . furosemide (LASIX) 80 MG tablet Take 1 tablet (80 mg total) by mouth daily. 90 tablet 3  . glimepiride (AMARYL) 1 MG tablet TAKE (1/2) TABLET BY MOUTH DAILY. 15 tablet 3  . glucose blood (ONE TOUCH TEST STRIPS) test strip Test once daily. DX;   250.00 50 each 6  . guaiFENesin (MUCINEX) 600 MG 12 hr tablet Take 600 mg by mouth 2 (two) times daily.    . hydrOXYzine (ATARAX/VISTARIL) 25 MG tablet TAKE 1 TABLET EVERY 6 HOURS AS NEEDED FOR ITCHING OR ANXIETY. 30 tablet 6  . levothyroxine (SYNTHROID, LEVOTHROID) 50 MCG tablet TAKE 1 TABLET DAILY BEFORE BREAKFAST. 30 tablet 3  . meclizine (ANTIVERT) 25 MG tablet TAKE 1 TABLET 3 TIMES A DAY AS NEEDED FOR DIZZINESS. 50 tablet 1  . metoprolol tartrate (LOPRESSOR) 25 MG tablet TAKE (1) TABLET TWICE DAILY. 60 tablet 3  . metoprolol tartrate (LOPRESSOR) 25 MG tablet TAKE (1) TABLET TWICE DAILY. 60 tablet 6  . ONE TOUCH LANCETS MISC Test once daily. Dx:  250.00 200 each 6  . pantoprazole (PROTONIX) 40 MG tablet TAKE (1) TABLET TWICE DAILY. 60 tablet 6  . vitamin B-12 (CYANOCOBALAMIN) 1000 MCG tablet Take 1,000 mcg by mouth every morning.    . nitroGLYCERIN (NITROSTAT) 0.4 MG SL tablet Place 1 tablet (0.4 mg total) under the tongue every 5 (five) minutes as needed for chest pain (up to 3 doses). 25 tablet 4  No current facility-administered medications for this visit.    Discontinued Meds:    Medications Discontinued During This Encounter  Medication Reason  . EFFIENT 5 MG TABS tablet Error    Patient Active Problem List   Diagnosis Date Noted  . Pleural effusion on left 09/12/2014  . Chronic diastolic CHF (congestive heart failure) (HCC) 11/15/2013  . Edema 11/15/2013  . Tendinitis of left ankle 06/06/2013  . Left ankle pain 06/06/2013  . CHF exacerbation (HCC) 04/29/2013  . Acute CHF (congestive heart failure) (HCC) 04/29/2013  . NSTEMI (non-ST elevated myocardial infarction) (HCC) 08/22/2012  . Atrial fibrillation (HCC) 08/21/2012  . Arrhythmia 08/20/2012  . Cardiorenal syndrome 04/01/2012  . Chronic kidney disease, stage III (moderate) 03/30/2012  . Venous insufficiency (chronic) (peripheral) 12/07/2011  . OA (osteoarthritis) of knee 09/23/2011  . Anxiety 08/25/2011  . DJD  (degenerative joint disease)   . Arteriosclerotic cardiovascular disease (ASCVD) 03/02/2011  . Cerebrovascular disease 09/23/2010  . Vitamin D deficiency 02/26/2009  . Anemia, normocytic normochromic 10/18/2007  . Sarcoidosis (HCC) 06/14/2007  . Diabetes mellitus, type II (HCC) 06/14/2007  . Hyperlipidemia 06/14/2007  . Overweight(278.02) 06/14/2007  . Hypertension 06/14/2007  . PERIPHERAL VASCULAR DISEASE 06/14/2007  . Irritable bowel syndrome 06/14/2007    LABS    Component Value Date/Time   NA 137 09/12/2014 1256   NA 138 08/10/2014 1425   NA 137 07/11/2014 1312   K 3.9 09/12/2014 1256   K 3.8 08/10/2014 1425   K 4.2 07/11/2014 1312   CL 95* 09/12/2014 1256   CL 97 08/10/2014 1425   CL 98 07/11/2014 1312   CO2 33* 09/12/2014 1256   CO2 30 08/10/2014 1425   CO2 29 07/11/2014 1312   GLUCOSE 128* 09/12/2014 1256   GLUCOSE 116* 08/10/2014 1425   GLUCOSE 126* 07/11/2014 1312   BUN 31* 09/12/2014 1256   BUN 20 08/10/2014 1425   BUN 20 07/11/2014 1312   CREATININE 1.31* 09/12/2014 1256   CREATININE 1.29* 08/10/2014 1425   CREATININE 1.24* 07/11/2014 1312   CREATININE 1.2 08/14/2013 1613   CREATININE 1.43* 03/24/2012 1512   CREATININE 0.98 04/20/2011 1135   CALCIUM 10.0 09/12/2014 1256   CALCIUM 9.4 08/10/2014 1425   CALCIUM 9.9 07/11/2014 1312   GFRNONAA 38* 05/04/2013 0502   GFRNONAA 40* 05/03/2013 0505   GFRNONAA 38* 05/02/2013 0523   GFRAA 44* 05/04/2013 0502   GFRAA 46* 05/03/2013 0505   GFRAA 44* 05/02/2013 0523   CMP     Component Value Date/Time   NA 137 09/12/2014 1256   K 3.9 09/12/2014 1256   CL 95* 09/12/2014 1256   CO2 33* 09/12/2014 1256   GLUCOSE 128* 09/12/2014 1256   BUN 31* 09/12/2014 1256   CREATININE 1.31* 09/12/2014 1256   CREATININE 1.29* 08/10/2014 1425   CALCIUM 10.0 09/12/2014 1256   PROT 7.6 07/11/2014 1312   ALBUMIN 4.2 07/11/2014 1312   AST 26 07/11/2014 1312   ALT 13 07/11/2014 1312   ALKPHOS 98 07/11/2014 1312   BILITOT 0.8  07/11/2014 1312   GFRNONAA 38* 05/04/2013 0502   GFRAA 44* 05/04/2013 0502       Component Value Date/Time   WBC 7.1 11/14/2014 1246   WBC 8.3 07/11/2014 1312   WBC 8.3 08/14/2013 1613   HGB 11.1* 11/14/2014 1246   HGB 13.5 07/11/2014 1312   HGB 11.9* 08/14/2013 1613   HCT 34.2* 11/14/2014 1246   HCT 40.4 07/11/2014 1312   HCT 35.8* 08/14/2013 1613   MCV 94.7  11/14/2014 1246   MCV 92.6 07/11/2014 1312   MCV 89.3 08/14/2013 1613    Lipid Panel     Component Value Date/Time   CHOL 116 03/21/2012 0031   TRIG 97 03/21/2012 0031   HDL 46 03/21/2012 0031   CHOLHDL 2.5 03/21/2012 0031   VLDL 19 03/21/2012 0031   LDLCALC 51 03/21/2012 0031    ABG No results found for: PHART, PCO2ART, PO2ART, HCO3, TCO2, ACIDBASEDEF, O2SAT   Lab Results  Component Value Date   TSH 3.47 07/11/2014   BNP (last 3 results) No results for input(s): BNP in the last 8760 hours.  ProBNP (last 3 results)  Recent Labs  07/11/14 1312 09/12/14 1256  PROBNP 978.0* 1324.0*    Cardiac Panel (last 3 results) No results for input(s): CKTOTAL, CKMB, TROPONINI, RELINDX in the last 72 hours.  Iron/TIBC/Ferritin/ %Sat    Component Value Date/Time   IRON 42 10/21/2012 1337   IRONPCTSAT 13.0* 10/21/2012 1337     EKG Orders placed or performed in visit on 08/08/14  . EKG 12-Lead     Prior Assessment and Plan Problem List as of 03/25/2015      Cardiovascular and Mediastinum   Hypertension   Last Assessment & Plan 08/14/2013 Office Visit Written 08/14/2013  3:32 PM by Julio Sicks, NP    Controlled on rx       PERIPHERAL VASCULAR DISEASE   Arteriosclerotic cardiovascular disease (ASCVD)   Last Assessment & Plan 12/07/2012 Office Visit Edited 12/07/2012  4:37 PM by Jodelle Gross, NP    She is asymptomatic with the exception of some elevated HR with exertion, walking from one room to the other, but with rest, heart rate returns to normal. She will continue on DAPT. She is very sedentary. She  is anxious about her heart disease and life expectancy. She has been told that she will not survive another MI.  I have reassured her that we cannot predict a future event, although she has significant disease. She wishes medical management only with close follow up. She would like to see Dr. Beulah Gandy on next visit to be established with him as well.       Atrial fibrillation [facility])   Last Assessment & Plan 12/07/2012 Office Visit Written 12/07/2012  4:36 PM by Jodelle Gross, NP    Heart rate is well controlled. She will continue on current medications. No changes or planned testing at this time.      Cerebrovascular disease   Last Assessment & Plan 06/13/2012 Office Visit Written 06/13/2012 10:49 PM by Kathlen Brunswick, MD    Progression in carotid artery disease between 2012 and 2013. A repeat study will be obtained.      Venous insufficiency (chronic) (peripheral)   Cardiorenal syndrome   Last Assessment & Plan 09/02/2012 Office Visit Written 09/02/2012  3:37 PM by Jodelle Gross, NP    Avapro has been discontinued in this setting. She continues on Lasix 40 mg daily. No evidence of fluid retention at this time or CHF      Arrhythmia   NSTEMI (non-ST elevated myocardial infarction) [facility])   CHF exacerbation (HCC)   Acute CHF (congestive heart failure) [facility])   Last Assessment & Plan 08/14/2013 Office Visit Written 08/14/2013  3:34 PM by Julio Sicks, NP    Recent admission 04/2013  Now on O2 at 2 l  Portable tank order sent to DME       Chronic diastolic CHF (congestive heart failure) (HCC)  Respiratory   Pleural effusion on left     Digestive   Irritable bowel syndrome     Endocrine   Diabetes mellitus, type II [facility])   Last Assessment & Plan 08/14/2013 Office Visit Written 08/14/2013  3:32 PM by Julio Sicks, NP    Diet discussed  Check labs        Musculoskeletal and Integument   DJD (degenerative joint disease)   OA (osteoarthritis) of knee   Tendinitis of  left ankle     Genitourinary   Chronic kidney disease, stage III (moderate)   Last Assessment & Plan 08/14/2013 Office Visit Written 08/14/2013  3:32 PM by Julio Sicks, NP    Labs today ,  Avoid nsaids         Other   Hyperlipidemia   Last Assessment & Plan 12/07/2012 Office Visit Written 12/07/2012  4:38 PM by Jodelle Gross, NP    Continued management with statin. Follow up labs in 6 months.      Sarcoidosis (HCC)   Vitamin D deficiency   Overweight(278.02)   Last Assessment & Plan 09/23/2010 Office Visit Written 09/23/2010  2:56 PM by Rollene Rotunda, MD    She understands the need for weight loss with dietary restriction.      Anemia, normocytic normochromic   Last Assessment & Plan 08/14/2013 Office Visit Written 08/14/2013  3:31 PM by Julio Sicks, NP    Labs today       Anxiety   Left ankle pain   Edema       Imaging: No results found.

## 2015-03-25 NOTE — Progress Notes (Signed)
Cardiology Office Note   Date:  03/25/2015   ID:  Alexandra [name], DOB [birth year], MRN [id number]  PCP:  [name], MD  Cardiologist:  Inis Sizer, NP   Chief Complaint  Patient presents with  . Congestive Heart Failure      History of Present Illness: Alexandra [name] is a 81 y.o. female normally seen in the [facility] office added on to my schedule, who presents for ongoing assessment and management of CAD, hx of NSTEMI in 07/2012, severe stenosis in the proximal LAD stent and complex CAD with moderately severe disease of the RCA and circumflex. She was offered CABG but refused. Per Dr. Clifton James, attempted PCI of the RCA or circumflex should only be done if she had recurrent symptoms. He felt that PCI would be very difficult given the degree of calcium and tortuosity. She was continued on DAPT (Plavix nonresponder, on Effient). She was not placed on Coumadin or Xarelto for atrial fibrillation in the setting of dual antiplatelet therapy.  She comes today with complaints of LEE that has worsened over the last 3 days. She has some weeping on the left pre-tibial area with 2+-3+ pitting edema. She denies dietary non-compliance. She states that she actually has not been eating much over the last couple of months as she hasn't felt well. She states her wt has been unchanged despite not eating. She denies chest pain or dyspnea. She has not taken her medications today as she is hasn't felt well.   Past Medical History  Diagnosis Date  . Asthmatic bronchitis   . Sarcoidosis (HCC)     a. 1990s of lung tx with prednisone temporarily.  [name] HTN (hypertension)   . PVD (peripheral vascular disease) (HCC)     a. 06/2012 Carotid U/S: < 50% bilat ICA stenosis.  . Hyperlipemia   . DM (diabetes mellitus) (HCC)   . Obesity   . GERD (gastroesophageal reflux disease)   . Diverticulosis of colon   . IBS (irritable bowel syndrome)   . DJD (degenerative joint disease)     Hip and knee pain; hip  bursitis; patellar subluxation  . Vitamin D deficiency disease   . Fibromyalgia   . Anxiety     a. Guaiac neg 03/2012.  . Intermittent vertigo   . Anemia   . Venous insufficiency   . PSVT (paroxysmal supraventricular tachycardia) (HCC)     a. Prior hx of palpitations - developed SVT during stress Myoview 09/2011. b. Noted on tele 03/2012.  . Valvular heart disease     a. moderately thickened/mildly calcified aortic valve, mod MR 03/20/12  . CKD (chronic kidney disease), stage II   . PAC (premature atrial contraction)   . CAD (coronary artery disease) 03/2011    a. NSTEMI 03/2011 complicated by pulm edema s/p DES to subtotal LAD. b. [place] s/p PTCA/cutting balloon to prox LAD for ISR 12/01/11 (med rx for RCA/Cx dz).  c. NSTEMI 03/2012 s/p cutting angioplasty of ISR of prox LAD 03/21/12 (PLAVIX NONRESPONDER). d. DES to LAD for ISR 07/2012 (pt refused consideration of CABG).  [name] A-fib (HCC)     a. Noted 07/2012 - not on anticoag due for need for DAPT and age.  . Thrombocytopenia (HCC)     a. Noted 07/2012.  . On home oxygen therapy     2L/min with ambulation as of 07/2012.    Past Surgical History  Procedure Laterality Date  . Cholecystectomy    . Tonsillectomy    . Hernia repair    .  Cataract extraction    . Coronary angioplasty with stent placement  03/21/2012  . Left heart catheterization with coronary angiogram N/A 12/01/2011    Procedure: LEFT HEART CATHETERIZATION WITH CORONARY ANGIOGRAM;  Surgeon: Peter M Swaziland, MD;  Location: [facility] CATH LAB;  Service: Cardiovascular;  Laterality: N/A;  . Percutaneous coronary stent intervention (pci-s)  12/01/2011    Procedure: PERCUTANEOUS CORONARY STENT INTERVENTION (PCI-S);  Surgeon: Peter M [place]aziland, MD;  Location: [facility] CATH LAB;  Service: Cardiovascular;;  . Left heart catheterization with coronary angiogram N/A 03/21/2012    Procedure: LEFT HEART CATHETERIZATION WITH CORONARY ANGIOGRAM;  Surgeon: Laurey Morale, MD;  Location: [facility] CATH LAB;  Service:  Cardiovascular;  Laterality: N/A;  . Percutaneous coronary stent intervention (pci-s)  03/21/2012    Procedure: PERCUTANEOUS CORONARY STENT INTERVENTION (PCI-S);  Surgeon: Kathleene Hazel, MD;  Location: [facility] CATH LAB;  Service: Cardiovascular;;  . Left heart catheterization with coronary angiogram N/A 08/18/2012    Procedure: LEFT HEART CATHETERIZATION WITH CORONARY ANGIOGRAM;  Surgeon: Kathleene Hazel, MD;  Location: [facility] CATH LAB;  Service: Cardiovascular;  Laterality: N/A;  . Percutaneous coronary stent intervention (pci-s)  08/18/2012    Procedure: PERCUTANEOUS CORONARY STENT INTERVENTION (PCI-S);  Surgeon: Kathleene Hazel, MD;  Location: [facility] CATH LAB;  Service: Cardiovascular;;   des to prox lad with ivus and cutting balloon to existing lad stent      Current Outpatient Prescriptions  Medication Sig Dispense Refill  . acetaminophen (TYLENOL) 500 MG tablet Take 1 tablet (500 mg total) by mouth every 6 (six) hours as needed for pain.    [name] amitriptyline (ELAVIL) 10 MG tablet TAKE (1) TABLET BY MOUTH ONCE DAILY. 30 tablet 5  . ascorbic acid (VITAMIN C) 500 MG tablet Take 500 mg by mouth 2 (two) times daily. Twice daily with iron tablet    . aspirin EC 81 MG tablet Take 81 mg by mouth every morning.     [name] atorvastatin (LIPITOR) 40 MG tablet Take 1 tablet (40 mg total) by mouth daily. 30 tablet 6  . cholecalciferol (VITAMIN D) 1000 UNITS tablet Take 1,000 Units by mouth daily.    [name] econazole nitrate 1 % cream Apply 1 application topically daily as needed. FOR FEET IRRITATION    . EFFIENT 5 MG TABS tablet TAKE (1) TABLET BY MOUTH ONCE DAILY. 30 tablet 6  . ferrous sulfate 324 (65 FE) MG TBEC Take 1 tablet by mouth 2 (two) times daily.     . furosemide (LASIX) 80 MG tablet Take 1 tablet (80 mg total) by mouth daily. 90 tablet 3  . glimepiride (AMARYL) 1 MG tablet TAKE (1/2) TABLET BY MOUTH DAILY. 15 tablet 3  . glucose blood (ONE TOUCH TEST STRIPS) test strip Test once daily. DX;   250.00 50 each 6  . guaiFENesin (MUCINEX) 600 MG 12 hr tablet Take 600 mg by mouth 2 (two) times daily.    . hydrOXYzine (ATARAX/VISTARIL) 25 MG tablet TAKE 1 TABLET EVERY 6 HOURS AS NEEDED FOR ITCHING OR ANXIETY. 30 tablet 6  . levothyroxine (SYNTHROID, LEVOTHROID) 50 MCG tablet TAKE 1 TABLET DAILY BEFORE BREAKFAST. 30 tablet 3  . meclizine (ANTIVERT) 25 MG tablet TAKE 1 TABLET 3 TIMES A DAY AS NEEDED FOR DIZZINESS. 50 tablet 1  . metoprolol tartrate (LOPRESSOR) 25 MG tablet TAKE (1) TABLET TWICE DAILY. 60 tablet 3  . metoprolol tartrate (LOPRESSOR) 25 MG tablet TAKE (1) TABLET TWICE DAILY. 60 tablet 6  . ONE TOUCH LANCETS MISC Test once  daily. Dx:  250.00 200 each 6  . pantoprazole (PROTONIX) 40 MG tablet TAKE (1) TABLET TWICE DAILY. 60 tablet 6  . vitamin B-12 (CYANOCOBALAMIN) 1000 MCG tablet Take 1,000 mcg by mouth every morning.    . nitroGLYCERIN (NITROSTAT) 0.4 MG SL tablet Place 1 tablet (0.4 mg total) under the tongue every 5 (five) minutes as needed for chest pain (up to 3 doses). 25 tablet 4   No current facility-administered medications for this visit.    Allergies:   Codeine; Diazepam; Lipitor; Penicillins; Plavix; Sulfonamide derivatives; and Mango flavor    Social History:  The patient  reports that she has never smoked. She has never used smokeless tobacco. She reports that she does not drink alcohol or use illicit drugs.   Family History:  The patient's family history includes Heart failure in her father; Leukemia in her mother.    ROS: All other systems are reviewed and negative. Unless otherwise mentioned in H&P   Echocardiogram 05/01/2013.  Study data: Technically difficult study. - Left ventricle: The cavity size was mildly reduced. Wall thickness was increased in a pattern of severe LVH. Systolic function was vigorous. The estimated ejection fraction was in the range of 65% to 70%. There is a mild intracavitary gradient created by cavity  obliteration. Wall motion was normal; there were no regional wall motion abnormalities. Diastolic function is indeterminate. There is evidence of elevated left atrial pressure (E/e' 12) - Aortic valve: Moderately calcified annulus. Trileaflet; moderately thickened leaflets. There was mild stenosis. - Mitral valve: Moderately to severely calcified annulus. Moderately thickened leaflets . The findings are consistent with mild to moderate stenosis. - Left atrium: The atrium was severely dilated. - Right ventricle: The cavity size was mildly dilated. Wall thickness was increased. - Right atrium: The atrium was moderately to severely dilated. - Pulmonary arteries: Systolic pressure was severely increased. PA peak pressure: 75mm Hg (S).  PHYSICAL EXAM: VS:  BP 130/68 mmHg  Pulse 92  Ht  (1.676 m)  Wt 173 lb (78.472 kg)  BMI 27.94 kg/m2  SpO2 90% , BMI Body mass index is 27.94 kg/(m^2). GEN: Well nourished, well developed, in no acute distress HEENT: normal Neck: no JVD, carotid bruits, or masses Cardiac: RRR; no murmurs, rubs, or gallops,no edema  Respiratory:  Clear to auscultation bilaterally, normal work of breathing GI: soft, nontender, nondistended, + BS MS: Kyphosis is noted 2+-3+ pitting pre-tibial edema to the feet and knees.  Skin: warm and dry, no rash Neuro:  Strength and sensation are diminished.  Psych: euthymic mood, full affect   Recent Labs: 07/11/2014: ALT 13; TSH 3.47 09/12/2014: BUN 31*; Creatinine, Ser 1.31*; Potassium 3.9; Pro B Natriuretic peptide (BNP) 1324.0*; Sodium 137 11/14/2014: Hemoglobin 11.1*; Platelets 126*    Lipid Panel    Component Value Date/Time   CHOL 116 03/21/2012 0031   TRIG 97 03/21/2012 0031   HDL 46 03/21/2012 0031   CHOLHDL 2.5 03/21/2012 0031   VLDL 19 03/21/2012 0031   LDLCALC 51 03/21/2012 0031      Wt Readings from Last 3 Encounters:  03/25/15 173 lb (78.472 kg)  11/19/14 171 lb (77.565 kg)   11/14/14 171 lb (77.565 kg)        ASSESSMENT AND PLAN:  1. Acute on Chronic Diastolic CHF:  Will change her lasix to Demdex to 60 mg in am and 40 mg in pm. She will have potassium 20 mEq started as well. Will check CMET and CBC. Since she is not eating,  she may have a lower dry wt than previously recorded. She is not dyspneic but is having some pain in her lower extremities from the fluid. Will see her on close follow up in 3 days. Follow up BMET will be ordered. She is to go to ER if she does not diurese as she may have some bowel edema. Torsemide is being used for better bioavailability.   2. Hypertension: BP is controlled currently Will check this again on follow up. May need medication adjustments.   3. CAD: No chest pain. Last echo demonstrated normal EF with LA enlargement. No plans to test further at this time. Continue DAPT with Effient and ASA. Checking CBC for anemia in the setting of chronic anemia.       Current medicines are reviewed at length with the patient today.    Labs/ tests ordered today include: CMET, CBC, follow up labs in 3 days, BMET.  Orders Placed This Encounter  Procedures  . Comprehensive Metabolic Panel (CMET)  . CBC with Differential  . Basic Metabolic Panel (BMET)     Disposition:   FU with in 3 days.  Signed, Joni ReiningKathryn Sarina Robleto, NP  03/25/2015 4:34 PM    [facility] [address], [place], [place] [address] Phone: [phone]) [phone]; Fax: [phone]

## 2015-03-25 NOTE — Patient Instructions (Addendum)
Your physician recommends that you schedule a follow-up appointment in: ON Friday   Your physician has recommended you make the following change in your medication:   STOP TAKING LASIX  START: TAKING TORSEMIDE 60 MG IN THE MORNING AND 40 MG IN THE EVENING                 POTASSIUM 20 MEQ DAILY Your physician recommends that you return for lab work in: TODAY  HAVE LAB WORK DONE ON Friday MORNING (BMET)  If you need a refill on your cardiac medications before your next appointment, please call your pharmacy.  Thank you for choosing  [facility]!

## 2015-03-26 LAB — CBC WITH DIFFERENTIAL/PLATELET
BASOS PCT: 0 % (ref 0–1)
Basophils Absolute: 0 10*3/uL (ref 0.0–0.1)
Eosinophils Absolute: 0.1 10*3/uL (ref 0.0–0.7)
Eosinophils Relative: 1 % (ref 0–5)
HCT: 39.1 % (ref 36.0–46.0)
HEMOGLOBIN: 13.1 g/dL (ref 12.0–15.0)
LYMPHS ABS: 0.6 10*3/uL — AB (ref 0.7–4.0)
Lymphocytes Relative: 6 % — ABNORMAL LOW (ref 12–46)
MCH: 30 pg (ref 26.0–34.0)
MCHC: 33.5 g/dL (ref 30.0–36.0)
MCV: 89.7 fL (ref 78.0–100.0)
MONO ABS: 0.5 10*3/uL (ref 0.1–1.0)
MONOS PCT: 5 % (ref 3–12)
MPV: 10.3 fL (ref 8.6–12.4)
Neutro Abs: 8.9 10*3/uL — ABNORMAL HIGH (ref 1.7–7.7)
Neutrophils Relative %: 88 % — ABNORMAL HIGH (ref 43–77)
Platelets: 171 10*3/uL (ref 150–400)
RBC: 4.36 MIL/uL (ref 3.87–5.11)
RDW: 14 % (ref 11.5–15.5)
WBC: 10.1 10*3/uL (ref 4.0–10.5)

## 2015-03-26 LAB — COMPREHENSIVE METABOLIC PANEL
ALK PHOS: 98 U/L (ref 33–130)
ALT: 17 U/L (ref 6–29)
AST: 47 U/L — AB (ref 10–35)
Albumin: 3.8 g/dL (ref 3.6–5.1)
BILIRUBIN TOTAL: 1.9 mg/dL — AB (ref 0.2–1.2)
BUN: 24 mg/dL (ref 7–25)
CO2: 29 mmol/L (ref 20–31)
Calcium: 9.1 mg/dL (ref 8.6–10.4)
Chloride: 95 mmol/L — ABNORMAL LOW (ref 98–110)
Creat: 1.51 mg/dL — ABNORMAL HIGH (ref 0.60–0.88)
GLUCOSE: 154 mg/dL — AB (ref 65–99)
Potassium: 3.1 mmol/L — ABNORMAL LOW (ref 3.5–5.3)
Sodium: 140 mmol/L (ref 135–146)
Total Protein: 6.6 g/dL (ref 6.1–8.1)

## 2015-03-28 ENCOUNTER — Other Ambulatory Visit: Payer: Self-pay

## 2015-03-28 MED ORDER — TORSEMIDE 20 MG PO TABS: 20.0000 mg | ORAL_TABLET | Freq: Every day | ORAL | Status: AC

## 2015-03-29 ENCOUNTER — Emergency Department (HOSPITAL_COMMUNITY): Payer: Medicare Other

## 2015-03-29 ENCOUNTER — Encounter (HOSPITAL_COMMUNITY): Payer: Self-pay | Admitting: *Deleted

## 2015-03-29 ENCOUNTER — Encounter: Payer: Self-pay | Admitting: Adult Health

## 2015-03-29 ENCOUNTER — Ambulatory Visit (INDEPENDENT_AMBULATORY_CARE_PROVIDER_SITE_OTHER): Payer: Medicare Other | Admitting: Adult Health

## 2015-03-29 ENCOUNTER — Emergency Department (HOSPITAL_COMMUNITY)
Admission: EM | Admit: 2015-03-29 | Discharge: 2015-03-29 | Disposition: A | Discharge status: Home / Self Care | Payer: Medicare Other | Attending: Emergency Medicine | Admitting: Emergency Medicine

## 2015-03-29 VITALS — BP 102/62 | HR 89 | Ht 66.0 in

## 2015-03-29 DIAGNOSIS — Z9861 Coronary angioplasty status: Secondary | ICD-10-CM | POA: Diagnosis not present

## 2015-03-29 DIAGNOSIS — I4891 Unspecified atrial fibrillation: Secondary | ICD-10-CM | POA: Insufficient documentation

## 2015-03-29 DIAGNOSIS — K219 Gastro-esophageal reflux disease without esophagitis: Secondary | ICD-10-CM | POA: Insufficient documentation

## 2015-03-29 DIAGNOSIS — I481 Persistent atrial fibrillation: Secondary | ICD-10-CM | POA: Diagnosis not present

## 2015-03-29 DIAGNOSIS — R630 Anorexia: Secondary | ICD-10-CM | POA: Insufficient documentation

## 2015-03-29 DIAGNOSIS — R600 Localized edema: Secondary | ICD-10-CM | POA: Insufficient documentation

## 2015-03-29 DIAGNOSIS — Z7982 Long term (current) use of aspirin: Secondary | ICD-10-CM | POA: Diagnosis not present

## 2015-03-29 DIAGNOSIS — J45909 Unspecified asthma, uncomplicated: Secondary | ICD-10-CM | POA: Diagnosis not present

## 2015-03-29 DIAGNOSIS — Z880 Allergy status to penicillin: Secondary | ICD-10-CM | POA: Diagnosis not present

## 2015-03-29 DIAGNOSIS — E119 Type 2 diabetes mellitus without complications: Secondary | ICD-10-CM | POA: Diagnosis not present

## 2015-03-29 DIAGNOSIS — Z8739 Personal history of other diseases of the musculoskeletal system and connective tissue: Secondary | ICD-10-CM | POA: Insufficient documentation

## 2015-03-29 DIAGNOSIS — Z9889 Other specified postprocedural states: Secondary | ICD-10-CM | POA: Insufficient documentation

## 2015-03-29 DIAGNOSIS — Z79899 Other long term (current) drug therapy: Secondary | ICD-10-CM | POA: Diagnosis not present

## 2015-03-29 DIAGNOSIS — I129 Hypertensive chronic kidney disease with stage 1 through stage 4 chronic kidney disease, or unspecified chronic kidney disease: Secondary | ICD-10-CM | POA: Diagnosis not present

## 2015-03-29 DIAGNOSIS — D649 Anemia, unspecified: Secondary | ICD-10-CM | POA: Insufficient documentation

## 2015-03-29 DIAGNOSIS — M7989 Other specified soft tissue disorders: Secondary | ICD-10-CM | POA: Diagnosis not present

## 2015-03-29 DIAGNOSIS — R531 Weakness: Secondary | ICD-10-CM | POA: Diagnosis not present

## 2015-03-29 DIAGNOSIS — F419 Anxiety disorder, unspecified: Secondary | ICD-10-CM | POA: Insufficient documentation

## 2015-03-29 DIAGNOSIS — E669 Obesity, unspecified: Secondary | ICD-10-CM | POA: Diagnosis not present

## 2015-03-29 DIAGNOSIS — Z9981 Dependence on supplemental oxygen: Secondary | ICD-10-CM | POA: Diagnosis not present

## 2015-03-29 DIAGNOSIS — I4819 Other persistent atrial fibrillation: Secondary | ICD-10-CM

## 2015-03-29 DIAGNOSIS — E785 Hyperlipidemia, unspecified: Secondary | ICD-10-CM | POA: Diagnosis not present

## 2015-03-29 DIAGNOSIS — N182 Chronic kidney disease, stage 2 (mild): Secondary | ICD-10-CM | POA: Diagnosis not present

## 2015-03-29 DIAGNOSIS — I2510 Atherosclerotic heart disease of native coronary artery without angina pectoris: Secondary | ICD-10-CM | POA: Insufficient documentation

## 2015-03-29 DIAGNOSIS — R609 Edema, unspecified: Secondary | ICD-10-CM

## 2015-03-29 LAB — BASIC METABOLIC PANEL
Anion gap: 14 (ref 5–15)
BUN: 29 mg/dL — ABNORMAL HIGH (ref 6–20)
CHLORIDE: 95 mmol/L — AB (ref 101–111)
CO2: 29 mmol/L (ref 22–32)
CREATININE: 1.62 mg/dL — AB (ref 0.44–1.00)
Calcium: 8.7 mg/dL — ABNORMAL LOW (ref 8.9–10.3)
GFR calc non Af Amer: 27 mL/min — ABNORMAL LOW (ref >60)
GFR, EST AFRICAN AMERICAN: 31 mL/min — AB (ref >60)
GLUCOSE: 122 mg/dL — AB (ref 65–99)
Potassium: 3.1 mmol/L — ABNORMAL LOW (ref 3.5–5.1)
Sodium: 138 mmol/L (ref 135–145)

## 2015-03-29 LAB — CBC WITH DIFFERENTIAL/PLATELET
BASOS PCT: 0 %
Basophils Absolute: 0 10*3/uL (ref 0.0–0.1)
Eosinophils Absolute: 0.2 10*3/uL (ref 0.0–0.7)
Eosinophils Relative: 1 %
HEMATOCRIT: 41.3 % (ref 36.0–46.0)
HEMOGLOBIN: 13.9 g/dL (ref 12.0–15.0)
LYMPHS ABS: 0.9 10*3/uL (ref 0.7–4.0)
Lymphocytes Relative: 7 %
MCH: 31.3 pg (ref 26.0–34.0)
MCHC: 33.7 g/dL (ref 30.0–36.0)
MCV: 93 fL (ref 78.0–100.0)
MONO ABS: 0.5 10*3/uL (ref 0.1–1.0)
Monocytes Relative: 4 %
NEUTROS ABS: 10.8 10*3/uL — AB (ref 1.7–7.7)
NEUTROS PCT: 88 %
Platelets: 163 10*3/uL (ref 150–400)
RBC: 4.44 MIL/uL (ref 3.87–5.11)
RDW: 14.4 % (ref 11.5–15.5)
WBC: 12.4 10*3/uL — ABNORMAL HIGH (ref 4.0–10.5)

## 2015-03-29 MED ORDER — CLINDAMYCIN HCL 150 MG PO CAPS: 150.0000 mg | ORAL_CAPSULE | Freq: Three times a day (TID) | ORAL | Status: AC

## 2015-03-29 MED ORDER — FUROSEMIDE 10 MG/ML IJ SOLN: 80.0000 mg | Freq: Once | INTRAMUSCULAR | Status: DC

## 2015-03-29 MED ORDER — CLINDAMYCIN HCL 150 MG PO CAPS
150.0000 mg | ORAL_CAPSULE | Freq: Once | ORAL | Status: AC
  Administered 2015-03-29: 150 mg via ORAL
  Filled 2015-03-29: qty 1

## 2015-03-29 NOTE — ED Provider Notes (Signed)
CSN: [id number]     Arrival date & time 03/29/15  1530 History   First MD Initiated Contact with Patient 03/29/15 1615     Chief Complaint  Patient presents with  . Leg Swelling      HPI  Patient presents for evaluation of swelling in her legs.  History of congestive heart failure follows with CHF cardiology in the [facility] clinic adjacent the hospital. Was there today and was referred here because of some persistence of swelling in her legs and some erythema of her leg.  She was changed from Lasix to torsemide in the last few weeks. At her torsemide increased on Tuesday or Wednesday. But her creatinine bumped and it was dropped back down a few days later. She has had some improvement of dyspnea and some swelling. However she continues to have clear fluid weeping from her left leg and presents here. The nurse practitioner at cardiology was concerned about the possibility of cellulitis. Patient denies pain. There is some minimal erythema. She's had no fever shakes chills or other new or worsening symptoms.  Past Medical History  Diagnosis Date  . Asthmatic bronchitis   . Sarcoidosis (HCC)     a. 1990s of lung tx with prednisone temporarily.  [name] HTN (hypertension)   . PVD (peripheral vascular disease) (HCC)     a. 06/2012 Carotid U/S: < 50% bilat ICA stenosis.  . Hyperlipemia   . DM (diabetes mellitus) (HCC)   . Obesity   . GERD (gastroesophageal reflux disease)   . Diverticulosis of colon   . IBS (irritable bowel syndrome)   . DJD (degenerative joint disease)     Hip and knee pain; hip bursitis; patellar subluxation  . Vitamin D deficiency disease   . Fibromyalgia   . Anxiety     a. Guaiac neg 03/2012.  . Intermittent vertigo   . Anemia   . Venous insufficiency   . PSVT (paroxysmal supraventricular tachycardia) (HCC)     a. Prior hx of palpitations - developed SVT during stress Myoview 09/2011. b. Noted on tele 03/2012.  . Valvular heart disease     a. moderately  thickened/mildly calcified aortic valve, mod MR 03/20/12  . CKD (chronic kidney disease), stage II   . PAC (premature atrial contraction)   . CAD (coronary artery disease) 03/2011    a. NSTEMI 03/2011 complicated by pulm edema s/p DES to subtotal LAD. b. [place] s/p PTCA/cutting balloon to prox LAD for ISR 12/01/11 (med rx for RCA/Cx dz).  c. NSTEMI 03/2012 s/p cutting angioplasty of ISR of prox LAD 03/21/12 (PLAVIX NONRESPONDER). d. DES to LAD for ISR 07/2012 (pt refused consideration of CABG).  [name] A-fib (HCC)     a. Noted 07/2012 - not on anticoag due for need for DAPT and age.  . Thrombocytopenia (HCC)     a. Noted 07/2012.  . On home oxygen therapy     2L/min with ambulation as of 07/2012.   Past Surgical History  Procedure Laterality Date  . Cholecystectomy    . Tonsillectomy    . Hernia repair    . Cataract extraction    . Coronary angioplasty with stent placement  03/21/2012  . Left heart catheterization with coronary angiogram N/A 12/01/2011    Procedure: LEFT HEART CATHETERIZATION WITH CORONARY ANGIOGRAM;  Surgeon: Peter M Swaziland, MD;  Location: [facility] CATH LAB;  Service: Cardiovascular;  Laterality: N/A;  . Percutaneous coronary stent intervention (pci-s)  12/01/2011    Procedure: PERCUTANEOUS CORONARY STENT INTERVENTION (PCI-S);  Surgeon: Peter M Swaziland, MD;  Location: [facility] CATH LAB;  Service: Cardiovascular;;  . Left heart catheterization with coronary angiogram N/A 03/21/2012    Procedure: LEFT HEART CATHETERIZATION WITH CORONARY ANGIOGRAM;  Surgeon: Laurey Morale, MD;  Location: [facility] CATH LAB;  Service: Cardiovascular;  Laterality: N/A;  . Percutaneous coronary stent intervention (pci-s)  03/21/2012    Procedure: PERCUTANEOUS CORONARY STENT INTERVENTION (PCI-S);  Surgeon: Kathleene Hazel, MD;  Location: [facility] CATH LAB;  Service: Cardiovascular;;  . Left heart catheterization with coronary angiogram N/A 08/18/2012    Procedure: LEFT HEART CATHETERIZATION WITH CORONARY ANGIOGRAM;  Surgeon:  Kathleene Hazel, MD;  Location: [facility] CATH LAB;  Service: Cardiovascular;  Laterality: N/A;  . Percutaneous coronary stent intervention (pci-s)  08/18/2012    Procedure: PERCUTANEOUS CORONARY STENT INTERVENTION (PCI-S);  Surgeon: Kathleene Hazel, MD;  Location: [facility] CATH LAB;  Service: Cardiovascular;;   des to prox lad with ivus and cutting balloon to existing lad stent    Family History  Problem Relation Age of Onset  . Heart failure Father     died at 68  . Leukemia Mother     died at 43   Social History  Substance Use Topics  . Smoking status: Never Smoker   . Smokeless tobacco: Never Used  . Alcohol Use: No     Comment: social   OB History    No data available     Review of Systems  Constitutional: Positive for activity change and appetite change. Negative for fever, chills, diaphoresis and fatigue.  HENT: Negative for mouth sores, sore throat and trouble swallowing.   Eyes: Negative for visual disturbance.  Respiratory: Negative for cough, chest tightness, shortness of breath and wheezing.   Cardiovascular: Positive for leg swelling. Negative for chest pain.  Gastrointestinal: Negative for nausea, vomiting, abdominal pain, diarrhea and abdominal distention.  Endocrine: Negative for polydipsia, polyphagia and polyuria.  Genitourinary: Negative for dysuria, frequency and hematuria.  Musculoskeletal: Negative for gait problem.  Skin: Negative for color change, pallor and rash.  Neurological: Positive for weakness. Negative for dizziness, syncope, light-headedness and headaches.  Hematological: Does not bruise/bleed easily.  Psychiatric/Behavioral: Negative for behavioral problems and confusion.      Allergies  Codeine; Diazepam; Lipitor; Penicillins; Plavix; Sulfonamide derivatives; and Mango flavor  Home Medications   Prior to Admission medications   Medication Sig Start Date End Date Taking? Authorizing Provider  acetaminophen (TYLENOL) 500 MG tablet Take  1 tablet (500 mg total) by mouth every 6 (six) hours as needed for pain. 08/22/12   Dayna N Dunn, PA-C  amitriptyline (ELAVIL) 10 MG tablet TAKE (1) TABLET BY MOUTH ONCE DAILY. 03/05/15   [name], MD  ascorbic acid (VITAMIN C) 500 MG tablet Take 500 mg by mouth 2 (two) times daily. Twice daily with iron tablet    Historical Provider, MD  aspirin EC 81 MG tablet Take 81 mg by mouth every morning.     Historical Provider, MD  atorvastatin (LIPITOR) 40 MG tablet Take 1 tablet (40 mg total) by mouth daily. 11/19/14   Laqueta Linden, MD  cholecalciferol (VITAMIN D) 1000 UNITS tablet Take 1,000 Units by mouth daily.    Historical Provider, MD  econazole nitrate 1 % cream Apply 1 application topically daily as needed. FOR FEET IRRITATION    Historical Provider, MD  EFFIENT 5 MG TABS tablet TAKE (1) TABLET BY MOUTH ONCE DAILY. 03/14/15   Laqueta Linden, MD  ferrous sulfate 324 (65 FE)  MG TBEC Take 1 tablet by mouth 2 (two) times daily.     Historical Provider, MD  furosemide (LASIX) 80 MG tablet  03/14/15   Historical Provider, MD  glimepiride (AMARYL) 1 MG tablet TAKE (1/2) TABLET BY MOUTH DAILY. 01/31/15   Michele McalpineScott M Nadel, MD  glucose blood (ONE TOUCH TEST STRIPS) test strip Test once daily. DX;  250.00 04/06/14   Michele McalpineScott M Nadel, MD  guaiFENesin (MUCINEX) 600 MG 12 hr tablet Take 600 mg by mouth 2 (two) times daily.    Historical Provider, MD  hydrOXYzine (ATARAX/VISTARIL) 25 MG tablet TAKE 1 TABLET EVERY 6 HOURS AS NEEDED FOR ITCHING OR ANXIETY. 05/04/14   Michele McalpineScott M Nadel, MD  levothyroxine (SYNTHROID, LEVOTHROID) 50 MCG tablet TAKE 1 TABLET DAILY BEFORE BREAKFAST. 03/14/15   Michele McalpineScott M Nadel, MD  meclizine (ANTIVERT) 25 MG tablet TAKE 1 TABLET 3 TIMES A DAY AS NEEDED FOR DIZZINESS.    Michele McalpineScott M Nadel, MD  metoprolol tartrate (LOPRESSOR) 25 MG tablet TAKE (1) TABLET TWICE DAILY. 09/11/14   Laqueta LindenSuresh A Koneswaran, MD  metoprolol tartrate (LOPRESSOR) 25 MG tablet TAKE (1) TABLET TWICE DAILY. 11/12/14   Laqueta LindenSuresh A  Koneswaran, MD  nitroGLYCERIN (NITROSTAT) 0.4 MG SL tablet Place 1 tablet (0.4 mg total) under the tongue every 5 (five) minutes as needed for chest pain (up to 3 doses). 11/27/13 11/27/14  Antoine PocheJonathan F Branch, MD  ONE TOUCH LANCETS MISC Test once daily. Dx:  250.00 04/06/14   Michele McalpineScott M Nadel, MD  pantoprazole (PROTONIX) 40 MG tablet TAKE (1) TABLET TWICE DAILY. 12/20/14   Laqueta LindenSuresh A Koneswaran, MD  potassium chloride SA (KLOR-CON M20) 20 MEQ tablet Take 1 tablet (20 mEq total) by mouth daily. 03/25/15   Jodelle GrossKathryn M Lawrence, NP  torsemide (DEMADEX) 20 MG tablet Take 1 tablet (20 mg total) by mouth daily. 03/28/15   Jodelle GrossKathryn M Lawrence, NP  vitamin B-12 (CYANOCOBALAMIN) 1000 MCG tablet Take 1,000 mcg by mouth every morning.    Historical Provider, MD   BP 114/63 mmHg  Pulse 80  Temp(Src) 97.9 F (36.6 C) (Oral)  Resp 20  Ht 5\' 6"  (1.676 m)  Wt 175 lb (79.379 kg)  BMI 28.26 kg/m2  SpO2 94% Physical Exam  Constitutional: She is oriented to person, place, and time. She appears well-developed and well-nourished. No distress.  HENT:  Head: Normocephalic.  Eyes: Conjunctivae are normal. Pupils are equal, round, and reactive to light. No scleral icterus.  Neck: Normal range of motion. Neck supple. No thyromegaly present.  Cardiovascular: Normal rate and regular rhythm.  Exam reveals no gallop and no friction rub.   No murmur heard. Pulmonary/Chest: Effort normal and breath sounds normal. No respiratory distress. She has no wheezes. She has no rales.  Abdominal: Soft. Bowel sounds are normal. She exhibits no distension. There is no tenderness. There is no rebound.  Musculoskeletal: Normal range of motion.  Neurological: She is alert and oriented to person, place, and time.  Skin: Skin is warm and dry. No rash noted.  2-3+ edema in the bilateral kidneys. Some weeping of clear fluid from the anterior aspect the left lower leg. Minimal erythema to the anterior aspect without warmth. Does not appear obviously  cellulitic but is slightly asymmetric versus comparison to the right leg.  Psychiatric: She has a normal mood and affect. Her behavior is normal.    ED Course  Procedures (including critical care time) Labs Review Labs Reviewed  CBC WITH DIFFERENTIAL/PLATELET - Abnormal; Notable for the following:  WBC 12.4 (*)    Neutro Abs 10.8 (*)    All other components within normal limits  BASIC METABOLIC PANEL - Abnormal; Notable for the following:    Potassium 3.1 (*)    Chloride 95 (*)    Glucose, Bld 122 (*)    BUN 29 (*)    Creatinine, Ser 1.62 (*)    Calcium 8.7 (*)    GFR calc non Af Amer 27 (*)    GFR calc Af Amer 31 (*)    All other components within normal limits    Imaging Review Dg Chest 2 View  03/29/2015  CLINICAL DATA:  Bilateral lower extremity swelling with drainage for 3 days. History of asthma. EXAM: CHEST  2 VIEW COMPARISON:  09/12/2014 FINDINGS: There is moderate cardiac enlargement. Aortic atherosclerosis. Thickened mitral valve calcifications identified. No pleural effusion or edema. No airspace consolidation. Coarsened interstitial markings are noted bilaterally, likely chronic. IMPRESSION: 1. No acute cardiopulmonary abnormalities. 2. Chronic changes as above. Electronically Signed   By: Signa Kell M.D.   On: 03/29/2015 17:34   I have personally reviewed and evaluated these images and lab results as part of my medical decision-making.   EKG Interpretation None      MDM   Final diagnoses:  Dependent edema    Has some slight leukocytosis but clinically does not appear to be cellulitic. We'll plan low-dose short course clindamycin. We discussed admission and the patient immediately and repeatedly declines. She does not have frank anasarca thus I doubt bowel edema although a possibility. Does not clinically have dyspnea and has clear lungs and normal x-ray. Her creatinine has slightly increased to 1.6, last I do not think additional diuretics are an option at  this time. Placed in absorbent and compressive dressing to the left lower extremity from foot to knee. Given a referral to [facility] wound healing Center and a written prescription as well. Asked to follow closely with her cardiologist, and primary care physician.    Rolland Porter, MD 03/29/15 [phone]

## 2015-03-29 NOTE — ED Notes (Signed)
Bilateral lower extremity swelling with drainage. Sent to ED by cardiologist.

## 2015-03-29 NOTE — Discharge Instructions (Signed)
°  Strict elevation of your legs above the level of your heart for one hour at least twice per day. Try to avoid sitting with your legs below you for any prolonged periods of time Call the wound care center in [place] to arrange twice per week dressing changes  Peripheral Edema You have swelling in your legs (peripheral edema). This swelling is due to excess accumulation of salt and water in your body. Edema may be a sign of heart, kidney or liver disease, or a side effect of a medication. It may also be due to problems in the leg veins. Elevating your legs and using special support stockings may be very helpful, if the cause of the swelling is due to poor venous circulation. Avoid long periods of standing, whatever the cause. Treatment of edema depends on identifying the cause. Chips, pretzels, pickles and other salty foods should be avoided. Restricting salt in your diet is almost always needed. Water pills (diuretics) are often used to remove the excess salt and water from your body via urine. These medicines prevent the kidney from reabsorbing sodium. This increases urine flow. Diuretic treatment may also result in lowering of potassium levels in your body. Potassium supplements may be needed if you have to use diuretics daily. Daily weights can help you keep track of your progress in clearing your edema. You should call your caregiver for follow up care as recommended. SEEK IMMEDIATE MEDICAL CARE IF:   You have increased swelling, pain, redness, or heat in your legs.  You develop shortness of breath, especially when lying down.  You develop chest or abdominal pain, weakness, or fainting.  You have a fever.   This information is not intended to replace advice given to you by your health care provider. Make sure you discuss any questions you have with your health care provider.   Document Released: 06/25/2004 Document Revised: 08/10/2011 Document Reviewed: 11/28/2014 Elsevier Interactive Patient  Education [employer]2016 [employer]

## 2015-03-29 NOTE — Progress Notes (Deleted)
Name: Alexandra [name]    DOB: 05-14-1924  Age: 81 y.o.  MR#: [id number]       PCP:  [name], MD      Insurance: Payor: MEDICARE / Plan: MEDICARE PART A AND B / Product Type: *No Product type* /   CC:   No chief complaint on file.   VS Filed Vitals:   03/29/15 1457  BP: 102/62  Pulse: 89  Height:  (1.676 m)  SpO2: 93%    Weights Current Weight  03/25/15 173 lb (78.472 kg)  11/19/14 171 lb (77.565 kg)  11/14/14 171 lb (77.565 kg)    Blood Pressure  BP Readings from Last 3 Encounters:  03/29/15 102/62  03/25/15 130/68  11/19/14 121/78     Admit date:  (Not on file) Last encounter with RMR:  03/25/2015   Allergy Codeine; Diazepam; Lipitor; Penicillins; Plavix; Sulfonamide derivatives; and Mango flavor  Current Outpatient Prescriptions  Medication Sig Dispense Refill  . acetaminophen (TYLENOL) 500 MG tablet Take 1 tablet (500 mg total) by mouth every 6 (six) hours as needed for pain.    [name] amitriptyline (ELAVIL) 10 MG tablet TAKE (1) TABLET BY MOUTH ONCE DAILY. 30 tablet 5  . ascorbic acid (VITAMIN C) 500 MG tablet Take 500 mg by mouth 2 (two) times daily. Twice daily with iron tablet    . aspirin EC 81 MG tablet Take 81 mg by mouth every morning.     [name] atorvastatin (LIPITOR) 40 MG tablet Take 1 tablet (40 mg total) by mouth daily. 30 tablet 6  . cholecalciferol (VITAMIN D) 1000 UNITS tablet Take 1,000 Units by mouth daily.    [name] econazole nitrate 1 % cream Apply 1 application topically daily as needed. FOR FEET IRRITATION    . EFFIENT 5 MG TABS tablet TAKE (1) TABLET BY MOUTH ONCE DAILY. 30 tablet 6  . ferrous sulfate 324 (65 FE) MG TBEC Take 1 tablet by mouth 2 (two) times daily.     [name] glimepiride (AMARYL) 1 MG tablet TAKE (1/2) TABLET BY MOUTH DAILY. 15 tablet 3  . glucose blood (ONE TOUCH TEST STRIPS) test strip Test once daily. DX;  250.00 50 each 6  . guaiFENesin (MUCINEX) 600 MG 12 hr tablet Take 600 mg by mouth 2 (two) times daily.    . hydrOXYzine  (ATARAX/VISTARIL) 25 MG tablet TAKE 1 TABLET EVERY 6 HOURS AS NEEDED FOR ITCHING OR ANXIETY. 30 tablet 6  . levothyroxine (SYNTHROID, LEVOTHROID) 50 MCG tablet TAKE 1 TABLET DAILY BEFORE BREAKFAST. 30 tablet 3  . meclizine (ANTIVERT) 25 MG tablet TAKE 1 TABLET 3 TIMES A DAY AS NEEDED FOR DIZZINESS. 50 tablet 1  . metoprolol tartrate (LOPRESSOR) 25 MG tablet TAKE (1) TABLET TWICE DAILY. 60 tablet 3  . metoprolol tartrate (LOPRESSOR) 25 MG tablet TAKE (1) TABLET TWICE DAILY. 60 tablet 6  . ONE TOUCH LANCETS MISC Test once daily. Dx:  250.00 200 each 6  . pantoprazole (PROTONIX) 40 MG tablet TAKE (1) TABLET TWICE DAILY. 60 tablet 6  . potassium chloride SA (KLOR-CON M20) 20 MEQ tablet Take 1 tablet (20 mEq total) by mouth daily. 30 tablet 11  . torsemide (DEMADEX) 20 MG tablet Take 1 tablet (20 mg total) by mouth daily. 90 tablet 3  . vitamin B-12 (CYANOCOBALAMIN) 1000 MCG tablet Take 1,000 mcg by mouth every morning.    . nitroGLYCERIN (NITROSTAT) 0.4 MG SL tablet Place 1 tablet (0.4 mg total) under the tongue every 5 (five) minutes as  needed for chest pain (up to 3 doses). 25 tablet 4   No current facility-administered medications for this visit.    Discontinued Meds:   There are no discontinued medications.  Patient Active Problem List   Diagnosis Date Noted  . Pleural effusion on left 09/12/2014  . Chronic diastolic CHF (congestive heart failure) (HCC) 11/15/2013  . Edema 11/15/2013  . Tendinitis of left ankle 06/06/2013  . Left ankle pain 06/06/2013  . CHF exacerbation (HCC) 04/29/2013  . Acute CHF (congestive heart failure) (HCC) 04/29/2013  . NSTEMI (non-ST elevated myocardial infarction) (HCC) 08/22/2012  . Atrial fibrillation (HCC) 08/21/2012  . Arrhythmia 08/20/2012  . Cardiorenal syndrome 04/01/2012  . Chronic kidney disease, stage III (moderate) 03/30/2012  . Venous insufficiency (chronic) (peripheral) 12/07/2011  . OA (osteoarthritis) of knee 09/23/2011  . Anxiety  08/25/2011  . DJD (degenerative joint disease)   . Arteriosclerotic cardiovascular disease (ASCVD) 03/02/2011  . Cerebrovascular disease 09/23/2010  . Vitamin D deficiency 02/26/2009  . Anemia, normocytic normochromic 10/18/2007  . Sarcoidosis (HCC) 06/14/2007  . Diabetes mellitus, type II (HCC) 06/14/2007  . Hyperlipidemia 06/14/2007  . Overweight(278.02) 06/14/2007  . Hypertension 06/14/2007  . PERIPHERAL VASCULAR DISEASE 06/14/2007  . Irritable bowel syndrome 06/14/2007    LABS    Component Value Date/Time   NA 140 03/25/2015 1212   NA 137 09/12/2014 1256   NA 138 08/10/2014 1425   K 3.1* 03/25/2015 1212   K 3.9 09/12/2014 1256   K 3.8 08/10/2014 1425   CL 95* 03/25/2015 1212   CL 95* 09/12/2014 1256   CL 97 08/10/2014 1425   CO2 29 03/25/2015 1212   CO2 33* 09/12/2014 1256   CO2 30 08/10/2014 1425   GLUCOSE 154* 03/25/2015 1212   GLUCOSE 128* 09/12/2014 1256   GLUCOSE 116* 08/10/2014 1425   BUN 24 03/25/2015 1212   BUN 31* 09/12/2014 1256   BUN 20 08/10/2014 1425   CREATININE 1.51* 03/25/2015 1212   CREATININE 1.31* 09/12/2014 1256   CREATININE 1.29* 08/10/2014 1425   CREATININE 1.24* 07/11/2014 1312   CREATININE 1.2 08/14/2013 1613   CREATININE 1.43* 03/24/2012 1512   CALCIUM 9.1 03/25/2015 1212   CALCIUM 10.0 09/12/2014 1256   CALCIUM 9.4 08/10/2014 1425   GFRNONAA 38* 05/04/2013 0502   GFRNONAA 40* 05/03/2013 0505   GFRNONAA 38* 05/02/2013 0523   GFRAA 44* 05/04/2013 0502   GFRAA 46* 05/03/2013 0505   GFRAA 44* 05/02/2013 0523   CMP     Component Value Date/Time   NA 140 03/25/2015 1212   K 3.1* 03/25/2015 1212   CL 95* 03/25/2015 1212   CO2 29 03/25/2015 1212   GLUCOSE 154* 03/25/2015 1212   BUN 24 03/25/2015 1212   CREATININE 1.51* 03/25/2015 1212   CREATININE 1.31* 09/12/2014 1256   CALCIUM 9.1 03/25/2015 1212   PROT 6.6 03/25/2015 1212   ALBUMIN 3.8 03/25/2015 1212   AST 47* 03/25/2015 1212   ALT 17 03/25/2015 1212   ALKPHOS 98 03/25/2015  1212   BILITOT 1.9* 03/25/2015 1212   GFRNONAA 38* 05/04/2013 0502   GFRAA 44* 05/04/2013 0502       Component Value Date/Time   WBC 10.1 03/25/2015 1212   WBC 7.1 11/14/2014 1246   WBC 8.3 07/11/2014 1312   HGB 13.1 03/25/2015 1212   HGB 11.1* 11/14/2014 1246   HGB 13.5 07/11/2014 1312   HCT 39.1 03/25/2015 1212   HCT 34.2* 11/14/2014 1246   HCT 40.4 07/11/2014 1312   MCV 89.7 03/25/2015  1212   MCV 94.7 11/14/2014 1246   MCV 92.6 07/11/2014 1312    Lipid Panel     Component Value Date/Time   CHOL 116 03/21/2012 0031   TRIG 97 03/21/2012 0031   HDL 46 03/21/2012 0031   CHOLHDL 2.5 03/21/2012 0031   VLDL 19 03/21/2012 0031   LDLCALC 51 03/21/2012 0031    ABG No results found for: PHART, PCO2ART, PO2ART, HCO3, TCO2, ACIDBASEDEF, O2SAT   Lab Results  Component Value Date   TSH 3.47 07/11/2014   BNP (last 3 results) No results for input(s): BNP in the last 8760 hours.  ProBNP (last 3 results)  Recent Labs  07/11/14 1312 09/12/14 1256  PROBNP 978.0* 1324.0*    Cardiac Panel (last 3 results) No results for input(s): CKTOTAL, CKMB, TROPONINI, RELINDX in the last 72 hours.  Iron/TIBC/Ferritin/ %Sat    Component Value Date/Time   IRON 42 10/21/2012 1337   IRONPCTSAT 13.0* 10/21/2012 1337     EKG Orders placed or performed in visit on 08/08/14  . EKG 12-Lead     Prior Assessment and Plan Problem List as of 03/29/2015      Cardiovascular and Mediastinum   Hypertension   Last Assessment & Plan 08/14/2013 Office Visit Written 08/14/2013  3:32 PM by Julio Sicks, NP    Controlled on rx       PERIPHERAL VASCULAR DISEASE   Arteriosclerotic cardiovascular disease (ASCVD)   Last Assessment & Plan 12/07/2012 Office Visit Edited 12/07/2012  4:37 PM by Jodelle Gross, NP    She is asymptomatic with the exception of some elevated HR with exertion, walking from one room to the other, but with rest, heart rate returns to normal. She will continue on DAPT. She is  very sedentary. She is anxious about her heart disease and life expectancy. She has been told that she will not survive another MI.  I have reassured her that we cannot predict a future event, although she has significant disease. She wishes medical management only with close follow up. She would like to see Dr. Beulah Gandy on next visit to be established with him as well.       Atrial fibrillation [facility])   Last Assessment & Plan 12/07/2012 Office Visit Written 12/07/2012  4:36 PM by Jodelle Gross, NP    Heart rate is well controlled. She will continue on current medications. No changes or planned testing at this time.      Cerebrovascular disease   Last Assessment & Plan 06/13/2012 Office Visit Written 06/13/2012 10:49 PM by Kathlen Brunswick, MD    Progression in carotid artery disease between 2012 and 2013. A repeat study will be obtained.      Venous insufficiency (chronic) (peripheral)   Cardiorenal syndrome   Last Assessment & Plan 09/02/2012 Office Visit Written 09/02/2012  3:37 PM by Jodelle Gross, NP    Avapro has been discontinued in this setting. She continues on Lasix 40 mg daily. No evidence of fluid retention at this time or CHF      Arrhythmia   NSTEMI (non-ST elevated myocardial infarction) [facility])   CHF exacerbation (HCC)   Acute CHF (congestive heart failure) [facility])   Last Assessment & Plan 08/14/2013 Office Visit Written 08/14/2013  3:34 PM by Julio Sicks, NP    Recent admission 04/2013  Now on O2 at 2 l  Portable tank order sent to DME       Chronic diastolic CHF (congestive heart failure) (HCC)  Respiratory   Pleural effusion on left     Digestive   Irritable bowel syndrome     Endocrine   Diabetes mellitus, type II [facility](HCC)   Last Assessment & Plan 08/14/2013 Office Visit Written 08/14/2013  3:32 PM by Julio Sicksammy S Parrett, NP    Diet discussed  Check labs        Musculoskeletal and Integument   DJD (degenerative joint disease)   OA (osteoarthritis) of knee    Tendinitis of left ankle     Genitourinary   Chronic kidney disease, stage III (moderate)   Last Assessment & Plan 08/14/2013 Office Visit Written 08/14/2013  3:32 PM by Julio Sicksammy S Parrett, NP    Labs today ,  Avoid nsaids         Other   Hyperlipidemia   Last Assessment & Plan 12/07/2012 Office Visit Written 12/07/2012  4:38 PM by Jodelle GrossKathryn M Lawrence, NP    Continued management with statin. Follow up labs in 6 months.      Sarcoidosis (HCC)   Vitamin D deficiency   Overweight(278.02)   Last Assessment & Plan 09/23/2010 Office Visit Written 09/23/2010  2:56 PM by Rollene RotundaJames Hochrein, MD    She understands the need for weight loss with dietary restriction.      Anemia, normocytic normochromic   Last Assessment & Plan 08/14/2013 Office Visit Written 08/14/2013  3:31 PM by Julio Sicksammy S Parrett, NP    Labs today       Anxiety   Left ankle pain   Edema       Imaging: No results found.

## 2015-03-29 NOTE — Progress Notes (Signed)
Cardiology Office Note   Date:  03/29/2015   ID:  Alexandra [name], DOB 1923/08/31, MRN [id number]  PCP:  [name], MD  Cardiologist: Inis Sizer, NP   Chief Complaint  Patient presents with  . Edema  . Congestive Heart Failure      History of Present Illness: Alexandra [name] is a 81 y.o. female who presents for close follow up normally seen in the [place] office added on to my schedule, who presents for ongoing assessment and management of CAD, hx of NSTEMI in 07/2012, severe stenosis in the proximal LAD stent and complex CAD with moderately severe disease of the RCA and circumflex. She was offered CABG but refused. Per Dr. Clifton James, attempted PCI of the RCA or circumflex should only be done if she had recurrent symptoms.   On last office visit, she complained of LEE with wt gain and was diagnosed with diastolic CHF. She was treated with change from lasix to torsemide, and labs were completed. She had not been eating and drinking well and dry wt was to be adjusted once she diuresed. .   Na+ 140, Potassium 3.1, Creatinine 1.51. She was given potassium replacement and decreased on torsemide to 20 mg daily. She is here for close evaluation.  She states she is feeling worse. She continues to have weeping from her left leg. She is having some diarrhea and feels bad. She states that the change in medication helped some with fluid retention but she is having worsening weeping of her left leg. Denies pain or dyspnea.     Past Medical History  Diagnosis Date  . Asthmatic bronchitis   . Sarcoidosis (HCC)     a. 1990s of lung tx with prednisone temporarily.  [name] HTN (hypertension)   . PVD (peripheral vascular disease) (HCC)     a. 06/2012 Carotid U/S: < 50% bilat ICA stenosis.  . Hyperlipemia   . DM (diabetes mellitus) (HCC)   . Obesity   . GERD (gastroesophageal reflux disease)   . Diverticulosis of colon   . IBS (irritable bowel syndrome)   . DJD (degenerative joint  disease)     Hip and knee pain; hip bursitis; patellar subluxation  . Vitamin D deficiency disease   . Fibromyalgia   . Anxiety     a. Guaiac neg 03/2012.  . Intermittent vertigo   . Anemia   . Venous insufficiency   . PSVT (paroxysmal supraventricular tachycardia) (HCC)     a. Prior hx of palpitations - developed SVT during stress Myoview 09/2011. b. Noted on tele 03/2012.  . Valvular heart disease     a. moderately thickened/mildly calcified aortic valve, mod MR 03/20/12  . CKD (chronic kidney disease), stage II   . PAC (premature atrial contraction)   . CAD (coronary artery disease) 03/2011    a. NSTEMI 03/2011 complicated by pulm edema s/p DES to subtotal LAD. b. [place] s/p PTCA/cutting balloon to prox LAD for ISR 12/01/11 (med rx for RCA/Cx dz).  c. NSTEMI 03/2012 s/p cutting angioplasty of ISR of prox LAD 03/21/12 (PLAVIX NONRESPONDER). d. DES to LAD for ISR 07/2012 (pt refused consideration of CABG).  [name] A-fib (HCC)     a. Noted 07/2012 - not on anticoag due for need for DAPT and age.  . Thrombocytopenia (HCC)     a. Noted 07/2012.  . On home oxygen therapy     2L/min with ambulation as of 07/2012.    Past Surgical History  Procedure Laterality Date  .  Cholecystectomy    . Tonsillectomy    . Hernia repair    . Cataract extraction    . Coronary angioplasty with stent placement  03/21/2012  . Left heart catheterization with coronary angiogram N/A 12/01/2011    Procedure: LEFT HEART CATHETERIZATION WITH CORONARY ANGIOGRAM;  Surgeon: Peter M Swaziland, MD;  Location: [facility] CATH LAB;  Service: Cardiovascular;  Laterality: N/A;  . Percutaneous coronary stent intervention (pci-s)  12/01/2011    Procedure: PERCUTANEOUS CORONARY STENT INTERVENTION (PCI-S);  Surgeon: Peter M Swaziland, MD;  Location: [facility] CATH LAB;  Service: Cardiovascular;;  . Left heart catheterization with coronary angiogram N/A 03/21/2012    Procedure: LEFT HEART CATHETERIZATION WITH CORONARY ANGIOGRAM;  Surgeon: Laurey Morale, MD;   Location: [facility] CATH LAB;  Service: Cardiovascular;  Laterality: N/A;  . Percutaneous coronary stent intervention (pci-s)  03/21/2012    Procedure: PERCUTANEOUS CORONARY STENT INTERVENTION (PCI-S);  Surgeon: Kathleene Hazel, MD;  Location: [facility] CATH LAB;  Service: Cardiovascular;;  . Left heart catheterization with coronary angiogram N/A 08/18/2012    Procedure: LEFT HEART CATHETERIZATION WITH CORONARY ANGIOGRAM;  Surgeon: Kathleene Hazel, MD;  Location: [facility] CATH LAB;  Service: Cardiovascular;  Laterality: N/A;  . Percutaneous coronary stent intervention (pci-s)  08/18/2012    Procedure: PERCUTANEOUS CORONARY STENT INTERVENTION (PCI-S);  Surgeon: Kathleene Hazel, MD;  Location: [facility] CATH LAB;  Service: Cardiovascular;;   des to prox lad with ivus and cutting balloon to existing lad stent      Current Outpatient Prescriptions  Medication Sig Dispense Refill  . acetaminophen (TYLENOL) 500 MG tablet Take 1 tablet (500 mg total) by mouth every 6 (six) hours as needed for pain.    [name] amitriptyline (ELAVIL) 10 MG tablet TAKE (1) TABLET BY MOUTH ONCE DAILY. 30 tablet 5  . ascorbic acid (VITAMIN C) 500 MG tablet Take 500 mg by mouth 2 (two) times daily. Twice daily with iron tablet    . aspirin EC 81 MG tablet Take 81 mg by mouth every morning.     [name] atorvastatin (LIPITOR) 40 MG tablet Take 1 tablet (40 mg total) by mouth daily. 30 tablet 6  . cholecalciferol (VITAMIN D) 1000 UNITS tablet Take 1,000 Units by mouth daily.    [name] econazole nitrate 1 % cream Apply 1 application topically daily as needed. FOR FEET IRRITATION    . EFFIENT 5 MG TABS tablet TAKE (1) TABLET BY MOUTH ONCE DAILY. 30 tablet 6  . ferrous sulfate 324 (65 FE) MG TBEC Take 1 tablet by mouth 2 (two) times daily.     [name] glimepiride (AMARYL) 1 MG tablet TAKE (1/2) TABLET BY MOUTH DAILY. 15 tablet 3  . glucose blood (ONE TOUCH TEST STRIPS) test strip Test once daily. DX;  250.00 50 each 6  . guaiFENesin (MUCINEX) 600 MG 12 hr  tablet Take 600 mg by mouth 2 (two) times daily.    . hydrOXYzine (ATARAX/VISTARIL) 25 MG tablet TAKE 1 TABLET EVERY 6 HOURS AS NEEDED FOR ITCHING OR ANXIETY. 30 tablet 6  . levothyroxine (SYNTHROID, LEVOTHROID) 50 MCG tablet TAKE 1 TABLET DAILY BEFORE BREAKFAST. 30 tablet 3  . meclizine (ANTIVERT) 25 MG tablet TAKE 1 TABLET 3 TIMES A DAY AS NEEDED FOR DIZZINESS. 50 tablet 1  . metoprolol tartrate (LOPRESSOR) 25 MG tablet TAKE (1) TABLET TWICE DAILY. 60 tablet 3  . metoprolol tartrate (LOPRESSOR) 25 MG tablet TAKE (1) TABLET TWICE DAILY. 60 tablet 6  . ONE TOUCH LANCETS MISC Test once daily. Dx:  250.00 200 each 6  . pantoprazole (PROTONIX) 40 MG tablet TAKE (1) TABLET TWICE DAILY. 60 tablet 6  . potassium chloride SA (KLOR-CON M20) 20 MEQ tablet Take 1 tablet (20 mEq total) by mouth daily. 30 tablet 11  . torsemide (DEMADEX) 20 MG tablet Take 1 tablet (20 mg total) by mouth daily. 90 tablet 3  . vitamin B-12 (CYANOCOBALAMIN) 1000 MCG tablet Take 1,000 mcg by mouth every morning.    . nitroGLYCERIN (NITROSTAT) 0.4 MG SL tablet Place 1 tablet (0.4 mg total) under the tongue every 5 (five) minutes as needed for chest pain (up to 3 doses). 25 tablet 4   No current facility-administered medications for this visit.    Allergies:   Codeine; Diazepam; Lipitor; Penicillins; Plavix; Sulfonamide derivatives; and Mango flavor    Social History:  The patient  reports that she has never smoked. She has never used smokeless tobacco. She reports that she does not drink alcohol or use illicit drugs.   Family History:  The patient's family history includes Heart failure in her father; Leukemia in her mother.    ROS: All other systems are reviewed and negative. Unless otherwise mentioned in H&P    PHYSICAL EXAM: VS:  BP 102/62 mmHg  Pulse 89  Ht 5\' 6"  (1.676 m)  Wt   SpO2 93% , BMI There is no weight on file to calculate BMI. GEN: Well nourished, well developed, ill appearing,  HEENT: normal Neck:  no JVD, carotid bruits, or masses Cardiac: RRR;  2/6 systolic murmur, l work of breathing GI: soft, nontender, nondistended, + BS MS: no deformity or atrophy Skin: warm and dry, erythema and weeping of the left lower leg.  Neuro:  Strength and sensation are intact Psych:  Depressed and cracky  Recent Labs: 07/11/2014: TSH 3.47 09/12/2014: Pro B Natriuretic peptide (BNP) 1324.0* 03/25/2015: ALT 17; BUN 24; Creat 1.51*; Hemoglobin 13.1; Platelets 171; Potassium 3.1*; Sodium 140    Lipid Panel    Component Value Date/Time   CHOL 116 03/21/2012 0031   TRIG 97 03/21/2012 0031   HDL 46 03/21/2012 0031   CHOLHDL 2.5 03/21/2012 0031   VLDL 19 03/21/2012 0031   LDLCALC 51 03/21/2012 0031      Wt Readings from Last 3 Encounters:  03/29/15 175 lb (79.379 kg)  03/25/15 173 lb (78.472 kg)  11/19/14 171 lb (77.565 kg)     ASSESSMENT AND PLAN:  1. Lower extremity edema with possible cellulitis of the left lower leg: Continue weeping and now with erythema. She did not respond well to change in lasix to torsemide and states the edema is unchanged She may need hospitalization for diastolic CHF with IV antibiotics. She will need  More labs.  She will need antibiotics as well and need for wound care. I will send her to ER for more thorough evaluation than cardiology needs to complete. Last labs determined [facility]. She is afebrile currently.   2. Atrial fibrillation: Heart rate is regular and well controlled. Continue metoprolol. She will remain on Eliquis for now.   3. Hypertension: Low normal.   Due to multiple complaints, no improvement in edema, and possible infected lower left leg with erythema and weeping, will send to ER for more thorough evaluation. She has not seen her PCP Dr. Lennon AlstromNadal is a long while. She will need close follow up with him.  Current medicines are reviewed at length with the patient today.    Labs/ tests ordered today include:  None No orders of the  defined types were placed in this encounter.     Disposition:   FU with Dr. Purvis Sheffield in [place]    Signed, Joni Reining, NP  03/29/2015 3:56 PM    [facility] [address] [place], [place] [address] Phone: [phone]; Fax: [phone]

## 2015-03-29 NOTE — ED Notes (Signed)
Pt refused " liquid lasix " [place] a [place] poole if she took the lasix

## 2015-03-30 LAB — BASIC METABOLIC PANEL
BUN: 27 mg/dL — ABNORMAL HIGH (ref 7–25)
CO2: 28 mmol/L (ref 20–31)
Calcium: 8.3 mg/dL — ABNORMAL LOW (ref 8.6–10.4)
Chloride: 95 mmol/L — ABNORMAL LOW (ref 98–110)
Creat: 1.49 mg/dL — ABNORMAL HIGH (ref 0.60–0.88)
Glucose, Bld: 153 mg/dL — ABNORMAL HIGH (ref 65–99)
Potassium: 3.4 mmol/L — ABNORMAL LOW (ref 3.5–5.3)
SODIUM: 137 mmol/L (ref 135–146)

## 2015-03-30 NOTE — ED Notes (Signed)
Called in a prescription for clindamycin, 150mg  tabs 1 po tid x 7 days to [facility] per pt's request.

## 2015-04-16 ENCOUNTER — Other Ambulatory Visit: Payer: Self-pay | Admitting: Cardiovascular Disease

## 2015-05-04 ENCOUNTER — Inpatient Hospital Stay (HOSPITAL_COMMUNITY)
Admission: EM | Admit: 2015-05-04 | Discharge: 2015-06-02 | DRG: 872 | Disposition: E | Payer: Medicare Other | Attending: Internal Medicine | Admitting: Internal Medicine

## 2015-05-04 ENCOUNTER — Emergency Department (HOSPITAL_COMMUNITY): Payer: Medicare Other

## 2015-05-04 ENCOUNTER — Encounter (HOSPITAL_COMMUNITY): Payer: Self-pay

## 2015-05-04 DIAGNOSIS — A047 Enterocolitis due to Clostridium difficile: Secondary | ICD-10-CM | POA: Diagnosis present

## 2015-05-04 DIAGNOSIS — K529 Noninfective gastroenteritis and colitis, unspecified: Secondary | ICD-10-CM | POA: Diagnosis not present

## 2015-05-04 DIAGNOSIS — M797 Fibromyalgia: Secondary | ICD-10-CM | POA: Diagnosis present

## 2015-05-04 DIAGNOSIS — E8809 Other disorders of plasma-protein metabolism, not elsewhere classified: Secondary | ICD-10-CM | POA: Diagnosis present

## 2015-05-04 DIAGNOSIS — N183 Chronic kidney disease, stage 3 (moderate): Secondary | ICD-10-CM | POA: Diagnosis present

## 2015-05-04 DIAGNOSIS — M1990 Unspecified osteoarthritis, unspecified site: Secondary | ICD-10-CM | POA: Diagnosis present

## 2015-05-04 DIAGNOSIS — I248 Other forms of acute ischemic heart disease: Secondary | ICD-10-CM | POA: Diagnosis present

## 2015-05-04 DIAGNOSIS — A419 Sepsis, unspecified organism: Secondary | ICD-10-CM | POA: Diagnosis not present

## 2015-05-04 DIAGNOSIS — I5032 Chronic diastolic (congestive) heart failure: Secondary | ICD-10-CM | POA: Diagnosis present

## 2015-05-04 DIAGNOSIS — I130 Hypertensive heart and chronic kidney disease with heart failure and stage 1 through stage 4 chronic kidney disease, or unspecified chronic kidney disease: Secondary | ICD-10-CM | POA: Diagnosis present

## 2015-05-04 DIAGNOSIS — I482 Chronic atrial fibrillation: Secondary | ICD-10-CM | POA: Diagnosis present

## 2015-05-04 DIAGNOSIS — Z7982 Long term (current) use of aspirin: Secondary | ICD-10-CM | POA: Diagnosis not present

## 2015-05-04 DIAGNOSIS — E1122 Type 2 diabetes mellitus with diabetic chronic kidney disease: Secondary | ICD-10-CM | POA: Diagnosis present

## 2015-05-04 DIAGNOSIS — I872 Venous insufficiency (chronic) (peripheral): Secondary | ICD-10-CM | POA: Diagnosis present

## 2015-05-04 DIAGNOSIS — Z8249 Family history of ischemic heart disease and other diseases of the circulatory system: Secondary | ICD-10-CM | POA: Diagnosis not present

## 2015-05-04 DIAGNOSIS — Z955 Presence of coronary angioplasty implant and graft: Secondary | ICD-10-CM

## 2015-05-04 DIAGNOSIS — I4891 Unspecified atrial fibrillation: Secondary | ICD-10-CM | POA: Diagnosis present

## 2015-05-04 DIAGNOSIS — I739 Peripheral vascular disease, unspecified: Secondary | ICD-10-CM | POA: Diagnosis present

## 2015-05-04 DIAGNOSIS — Z7901 Long term (current) use of anticoagulants: Secondary | ICD-10-CM

## 2015-05-04 DIAGNOSIS — Z806 Family history of leukemia: Secondary | ICD-10-CM

## 2015-05-04 DIAGNOSIS — I10 Essential (primary) hypertension: Secondary | ICD-10-CM | POA: Diagnosis present

## 2015-05-04 DIAGNOSIS — E876 Hypokalemia: Secondary | ICD-10-CM | POA: Diagnosis present

## 2015-05-04 DIAGNOSIS — Z66 Do not resuscitate: Secondary | ICD-10-CM | POA: Diagnosis present

## 2015-05-04 DIAGNOSIS — Z9981 Dependence on supplemental oxygen: Secondary | ICD-10-CM | POA: Diagnosis not present

## 2015-05-04 DIAGNOSIS — N179 Acute kidney failure, unspecified: Secondary | ICD-10-CM | POA: Diagnosis present

## 2015-05-04 DIAGNOSIS — R110 Nausea: Secondary | ICD-10-CM

## 2015-05-04 DIAGNOSIS — E871 Hypo-osmolality and hyponatremia: Secondary | ICD-10-CM | POA: Diagnosis present

## 2015-05-04 DIAGNOSIS — E1140 Type 2 diabetes mellitus with diabetic neuropathy, unspecified: Secondary | ICD-10-CM

## 2015-05-04 DIAGNOSIS — I2510 Atherosclerotic heart disease of native coronary artery without angina pectoris: Secondary | ICD-10-CM

## 2015-05-04 DIAGNOSIS — K56609 Unspecified intestinal obstruction, unspecified as to partial versus complete obstruction: Secondary | ICD-10-CM

## 2015-05-04 DIAGNOSIS — T368X5A Adverse effect of other systemic antibiotics, initial encounter: Secondary | ICD-10-CM | POA: Diagnosis present

## 2015-05-04 DIAGNOSIS — E119 Type 2 diabetes mellitus without complications: Secondary | ICD-10-CM

## 2015-05-04 DIAGNOSIS — D869 Sarcoidosis, unspecified: Secondary | ICD-10-CM | POA: Diagnosis present

## 2015-05-04 DIAGNOSIS — K219 Gastro-esophageal reflux disease without esophagitis: Secondary | ICD-10-CM | POA: Diagnosis present

## 2015-05-04 DIAGNOSIS — I481 Persistent atrial fibrillation: Secondary | ICD-10-CM | POA: Diagnosis not present

## 2015-05-04 DIAGNOSIS — I252 Old myocardial infarction: Secondary | ICD-10-CM | POA: Diagnosis not present

## 2015-05-04 DIAGNOSIS — A0472 Enterocolitis due to Clostridium difficile, not specified as recurrent: Secondary | ICD-10-CM | POA: Diagnosis present

## 2015-05-04 LAB — URINE MICROSCOPIC-ADD ON

## 2015-05-04 LAB — PROCALCITONIN: Procalcitonin: 0.34 ng/mL

## 2015-05-04 LAB — URINALYSIS, ROUTINE W REFLEX MICROSCOPIC
Glucose, UA: NEGATIVE mg/dL
Hgb urine dipstick: NEGATIVE
Leukocytes, UA: NEGATIVE
Nitrite: NEGATIVE
Specific Gravity, Urine: 1.025 (ref 1.005–1.030)
pH: 5.5 (ref 5.0–8.0)

## 2015-05-04 LAB — CBC WITH DIFFERENTIAL/PLATELET
Basophils Absolute: 0 10*3/uL (ref 0.0–0.1)
Basophils Relative: 0 %
Eosinophils Absolute: 0 10*3/uL (ref 0.0–0.7)
Eosinophils Relative: 0 %
HCT: 41 % (ref 36.0–46.0)
Hemoglobin: 13.9 g/dL (ref 12.0–15.0)
Lymphocytes Relative: 3 %
Lymphs Abs: 0.7 10*3/uL (ref 0.7–4.0)
MCH: 31.1 pg (ref 26.0–34.0)
MCHC: 33.9 g/dL (ref 30.0–36.0)
MCV: 91.7 fL (ref 78.0–100.0)
Monocytes Absolute: 0.9 10*3/uL (ref 0.1–1.0)
Monocytes Relative: 4 %
Neutro Abs: 21.6 10*3/uL — ABNORMAL HIGH (ref 1.7–7.7)
Neutrophils Relative %: 93 %
Platelets: 130 10*3/uL — ABNORMAL LOW (ref 150–400)
RBC: 4.47 MIL/uL (ref 3.87–5.11)
RDW: 15.2 % (ref 11.5–15.5)
WBC: 23.2 10*3/uL — ABNORMAL HIGH (ref 4.0–10.5)

## 2015-05-04 LAB — BASIC METABOLIC PANEL
Anion gap: 16 — ABNORMAL HIGH (ref 5–15)
BUN: 26 mg/dL — ABNORMAL HIGH (ref 6–20)
CO2: 30 mmol/L (ref 22–32)
Calcium: 8.1 mg/dL — ABNORMAL LOW (ref 8.9–10.3)
Chloride: 88 mmol/L — ABNORMAL LOW (ref 101–111)
Creatinine, Ser: 1.31 mg/dL — ABNORMAL HIGH (ref 0.44–1.00)
GFR calc Af Amer: 41 mL/min — ABNORMAL LOW (ref >60)
GFR calc non Af Amer: 35 mL/min — ABNORMAL LOW (ref >60)
Glucose, Bld: 105 mg/dL — ABNORMAL HIGH (ref 65–99)
Potassium: 2.9 mmol/L — ABNORMAL LOW (ref 3.5–5.1)
Sodium: 134 mmol/L — ABNORMAL LOW (ref 135–145)

## 2015-05-04 LAB — LACTIC ACID, PLASMA: Lactic Acid, Venous: 1.3 mmol/L (ref 0.5–2.0)

## 2015-05-04 LAB — GLUCOSE, CAPILLARY: Glucose-Capillary: 102 mg/dL — ABNORMAL HIGH (ref 65–99)

## 2015-05-04 LAB — APTT: aPTT: 34 seconds (ref 24–37)

## 2015-05-04 LAB — PROTIME-INR
INR: 1.5 — ABNORMAL HIGH (ref 0.00–1.49)
PROTHROMBIN TIME: 18.2 s — AB (ref 11.6–15.2)

## 2015-05-04 LAB — TROPONIN I
TROPONIN I: 0.06 ng/mL — AB (ref <0.031)
Troponin I: 0.07 ng/mL — ABNORMAL HIGH (ref <0.031)

## 2015-05-04 MED ORDER — ONDANSETRON HCL 4 MG PO TABS: 4.0000 mg | ORAL_TABLET | Freq: Four times a day (QID) | ORAL | Status: DC | PRN

## 2015-05-04 MED ORDER — OXYCODONE HCL 5 MG PO TABS
5.0000 mg | ORAL_TABLET | ORAL | Status: DC | PRN
  Administered 2015-05-05 – 2015-05-06 (×2): 5 mg via ORAL
  Filled 2015-05-04 (×2): qty 1

## 2015-05-04 MED ORDER — SODIUM CHLORIDE 0.9 % IV SOLN: INTRAVENOUS | Status: DC

## 2015-05-04 MED ORDER — POTASSIUM CHLORIDE CRYS ER 20 MEQ PO TBCR
40.0000 meq | EXTENDED_RELEASE_TABLET | Freq: Once | ORAL | Status: DC
  Filled 2015-05-04: qty 2

## 2015-05-04 MED ORDER — ATORVASTATIN CALCIUM 40 MG PO TABS
40.0000 mg | ORAL_TABLET | Freq: Every day | ORAL | Status: DC
  Filled 2015-05-04: qty 1

## 2015-05-04 MED ORDER — CIPROFLOXACIN IN D5W 400 MG/200ML IV SOLN
400.0000 mg | Freq: Two times a day (BID) | INTRAVENOUS | Status: DC
  Administered 2015-05-05: 400 mg via INTRAVENOUS
  Filled 2015-05-04: qty 200

## 2015-05-04 MED ORDER — FUROSEMIDE 80 MG PO TABS: 80.0000 mg | ORAL_TABLET | Freq: Every day | ORAL | Status: DC

## 2015-05-04 MED ORDER — METOPROLOL TARTRATE 25 MG PO TABS
25.0000 mg | ORAL_TABLET | Freq: Two times a day (BID) | ORAL | Status: DC
  Filled 2015-05-04: qty 1

## 2015-05-04 MED ORDER — PROMETHAZINE HCL 25 MG/ML IJ SOLN
INTRAMUSCULAR | Status: AC
  Filled 2015-05-04: qty 1

## 2015-05-04 MED ORDER — INFLUENZA VAC SPLIT QUAD 0.5 ML IM SUSY
0.5000 mL | PREFILLED_SYRINGE | INTRAMUSCULAR | Status: DC
  Filled 2015-05-04: qty 0.5

## 2015-05-04 MED ORDER — LEVOTHYROXINE SODIUM 50 MCG PO TABS
50.0000 ug | ORAL_TABLET | Freq: Every day | ORAL | Status: DC
  Administered 2015-05-06: 50 ug via ORAL
  Filled 2015-05-04 (×2): qty 1

## 2015-05-04 MED ORDER — VITAMIN B-12 1000 MCG PO TABS
1000.0000 ug | ORAL_TABLET | Freq: Every morning | ORAL | Status: DC
  Filled 2015-05-04: qty 1

## 2015-05-04 MED ORDER — PROMETHAZINE HCL 25 MG RE SUPP
25.0000 mg | Freq: Four times a day (QID) | RECTAL | Status: DC | PRN
  Administered 2015-05-04 – 2015-05-05 (×2): 25 mg via RECTAL
  Filled 2015-05-04 (×2): qty 1

## 2015-05-04 MED ORDER — ONDANSETRON HCL 4 MG/2ML IJ SOLN
4.0000 mg | Freq: Once | INTRAMUSCULAR | Status: AC
  Administered 2015-05-04: 4 mg via INTRAVENOUS
  Filled 2015-05-04: qty 2

## 2015-05-04 MED ORDER — METRONIDAZOLE IN NACL 5-0.79 MG/ML-% IV SOLN
500.0000 mg | Freq: Once | INTRAVENOUS | Status: AC
  Administered 2015-05-04: 500 mg via INTRAVENOUS
  Filled 2015-05-04: qty 100

## 2015-05-04 MED ORDER — PROMETHAZINE HCL 25 MG/ML IJ SOLN
12.5000 mg | Freq: Once | INTRAMUSCULAR | Status: AC
  Administered 2015-05-04: 12.5 mg via INTRAVENOUS

## 2015-05-04 MED ORDER — HEPARIN SODIUM (PORCINE) 5000 UNIT/ML IJ SOLN
5000.0000 [IU] | Freq: Three times a day (TID) | INTRAMUSCULAR | Status: DC
  Administered 2015-05-04 – 2015-05-06 (×8): 5000 [IU] via SUBCUTANEOUS
  Filled 2015-05-04 (×8): qty 1

## 2015-05-04 MED ORDER — ONDANSETRON HCL 4 MG/2ML IJ SOLN
4.0000 mg | Freq: Four times a day (QID) | INTRAMUSCULAR | Status: DC | PRN
  Administered 2015-05-04 – 2015-05-06 (×5): 4 mg via INTRAVENOUS
  Filled 2015-05-04 (×7): qty 2

## 2015-05-04 MED ORDER — ACETAMINOPHEN 325 MG PO TABS: 650.0000 mg | ORAL_TABLET | Freq: Four times a day (QID) | ORAL | Status: DC | PRN

## 2015-05-04 MED ORDER — CIPROFLOXACIN IN D5W 400 MG/200ML IV SOLN
400.0000 mg | Freq: Once | INTRAVENOUS | Status: AC
  Administered 2015-05-04: 400 mg via INTRAVENOUS
  Filled 2015-05-04: qty 200

## 2015-05-04 MED ORDER — CETYLPYRIDINIUM CHLORIDE 0.05 % MT LIQD
7.0000 mL | Freq: Two times a day (BID) | OROMUCOSAL | Status: DC
  Administered 2015-05-04 – 2015-05-06 (×5): 7 mL via OROMUCOSAL

## 2015-05-04 MED ORDER — SODIUM CHLORIDE 0.9 % IV BOLUS (SEPSIS)
500.0000 mL | Freq: Once | INTRAVENOUS | Status: AC
  Administered 2015-05-04: 500 mL via INTRAVENOUS

## 2015-05-04 MED ORDER — PRASUGREL HCL 10 MG PO TABS
5.0000 mg | ORAL_TABLET | Freq: Every day | ORAL | Status: DC
  Administered 2015-05-06: 5 mg via ORAL
  Filled 2015-05-04 (×4): qty 1

## 2015-05-04 MED ORDER — POTASSIUM CHLORIDE 10 MEQ/100ML IV SOLN
10.0000 meq | INTRAVENOUS | Status: AC
  Administered 2015-05-04 (×2): 10 meq via INTRAVENOUS
  Filled 2015-05-04 (×3): qty 100

## 2015-05-04 MED ORDER — SODIUM CHLORIDE 0.9 % IJ SOLN
3.0000 mL | Freq: Two times a day (BID) | INTRAMUSCULAR | Status: DC
  Administered 2015-05-04 – 2015-05-06 (×2): 3 mL via INTRAVENOUS

## 2015-05-04 MED ORDER — METRONIDAZOLE IN NACL 5-0.79 MG/ML-% IV SOLN
500.0000 mg | Freq: Three times a day (TID) | INTRAVENOUS | Status: DC
  Administered 2015-05-04 – 2015-05-06 (×6): 500 mg via INTRAVENOUS
  Filled 2015-05-04 (×6): qty 100

## 2015-05-04 MED ORDER — INSULIN ASPART 100 UNIT/ML ~~LOC~~ SOLN
0.0000 [IU] | Freq: Three times a day (TID) | SUBCUTANEOUS | Status: DC
  Administered 2015-05-06: 2 [IU] via SUBCUTANEOUS
  Administered 2015-05-06: 1 [IU] via SUBCUTANEOUS

## 2015-05-04 MED ORDER — ACETAMINOPHEN 650 MG RE SUPP: 650.0000 mg | Freq: Four times a day (QID) | RECTAL | Status: DC | PRN

## 2015-05-04 MED ORDER — ASPIRIN EC 81 MG PO TBEC
81.0000 mg | DELAYED_RELEASE_TABLET | Freq: Every morning | ORAL | Status: DC
  Administered 2015-05-06: 81 mg via ORAL
  Filled 2015-05-04 (×2): qty 1

## 2015-05-04 MED ORDER — METOPROLOL TARTRATE 1 MG/ML IV SOLN: 2.5000 mg | Freq: Four times a day (QID) | INTRAVENOUS | Status: DC | PRN

## 2015-05-04 NOTE — Progress Notes (Signed)
Patients BP is 118/54, Paged on call MD to make them aware, will follow any new orders received and continue to monitor the patient.

## 2015-05-04 NOTE — ED Provider Notes (Signed)
CSN: [id number]     Arrival date & time 05/19/2015  1146 History   First MD Initiated Contact with Patient 05/11/2015 1203     Chief Complaint  Patient presents with  . Nausea     (Consider location/radiation/quality/duration/timing/severity/associated sxs/prior Treatment) HPI   81 year old female with nausea and vomiting.  Patient is generally very poor historian and is difficult to get a coherent history from her. Symptom onset around Monday. Diffuse abdominal pain. No urinary complaints. Some loose stools. No blood or melena. No actual vomiting. She cannot specifically give me anything that exacerbates relieving her symptoms. Denies any sick contacts.  Past Medical History  Diagnosis Date  . Asthmatic bronchitis   . Sarcoidosis (HCC)     a. 1990s of lung tx with prednisone temporarily.  [name]. HTN (hypertension)   . PVD (peripheral vascular disease) (HCC)     a. 06/2012 Carotid U/S: < 50% bilat ICA stenosis.  . Hyperlipemia   . DM (diabetes mellitus) (HCC)   . Obesity   . GERD (gastroesophageal reflux disease)   . Diverticulosis of colon   . IBS (irritable bowel syndrome)   . DJD (degenerative joint disease)     Hip and knee pain; hip bursitis; patellar subluxation  . Vitamin D deficiency disease   . Fibromyalgia   . Anxiety     a. Guaiac neg 03/2012.  . Intermittent vertigo   . Anemia   . Venous insufficiency   . PSVT (paroxysmal supraventricular tachycardia) (HCC)     a. Prior hx of palpitations - developed SVT during stress Myoview 09/2011. b. Noted on tele 03/2012.  . Valvular heart disease     a. moderately thickened/mildly calcified aortic valve, mod MR 03/20/12  . CKD (chronic kidney disease), stage II   . PAC (premature atrial contraction)   . CAD (coronary artery disease) 03/2011    a. NSTEMI 03/2011 complicated by pulm edema s/p DES to subtotal LAD. b. [place] s/p PTCA/cutting balloon to prox LAD for ISR 12/01/11 (med rx for RCA/Cx dz).  c. NSTEMI 03/2012 s/p cutting  angioplasty of ISR of prox LAD 03/21/12 (PLAVIX NONRESPONDER). d. DES to LAD for ISR 07/2012 (pt refused consideration of CABG).  [name]. A-fib (HCC)     a. Noted 07/2012 - not on anticoag due for need for DAPT and age.  . Thrombocytopenia (HCC)     a. Noted 07/2012.  . On home oxygen therapy     2L/min with ambulation as of 07/2012.   Past Surgical History  Procedure Laterality Date  . Cholecystectomy    . Tonsillectomy    . Hernia repair    . Cataract extraction    . Coronary angioplasty with stent placement  03/21/2012  . Left heart catheterization with coronary angiogram N/A 12/01/2011    Procedure: LEFT HEART CATHETERIZATION WITH CORONARY ANGIOGRAM;  Surgeon: Peter M Swazil[place]Jordan, MD;  [facility];  Service: Cardiovascular;  Laterality: N/A;  . Percutaneous coronary stent intervention (pci-s)  12/01/2011    Procedure: PERCUTANEOUS CORONARY STENT INTERVENTION (PCI-S);  Surgeon: Peter M [place]Jordan, MD;  [facility];  Service: Cardiovascular;;  . Left heart catheterization with coronary angiogram N/A 03/21/2012    Procedure: LEFT HEART CATHETERIZATION WITH CORONARY ANGIOGRAM;  Surgeon: Laurey Moralealton S McLean, MD;  [facility];  Service: Cardiovascular;  Laterality: N/A;  . Percutaneous coronary stent intervention (pci-s)  03/21/2012    Procedure: PERCUTANEOUS CORONARY STENT INTERVENTION (PCI-S);  Surgeon: Kathleene Hazelhristopher D McAlhany, MD;  [facility];  Service: Cardiovascular;;  . Left heart catheterization with coronary angiogram N/A 08/18/2012    Procedure: LEFT HEART CATHETERIZATION WITH CORONARY ANGIOGRAM;  Surgeon: Kathleene Hazel, MD;  Location: [facility] CATH LAB;  Service: Cardiovascular;  Laterality: N/A;  . Percutaneous coronary stent intervention (pci-s)  08/18/2012    Procedure: PERCUTANEOUS CORONARY STENT INTERVENTION (PCI-S);  Surgeon: Kathleene Hazel, MD;  Location: [facility] CATH LAB;  Service: Cardiovascular;;   des to prox lad with ivus and cutting balloon to existing  lad stent    Family History  Problem Relation Age of Onset  . Heart failure Father     died at 71  . Leukemia Mother     died at 49   Social History  Substance Use Topics  . Smoking status: Never Smoker   . Smokeless tobacco: Never Used  . Alcohol Use: No     Comment: social   OB History    No data available     Review of Systems  All systems reviewed and negative, other than as noted in HPI.   Allergies  Codeine; Diazepam; Lipitor; Penicillins; Plavix; Sulfonamide derivatives; and Mango flavor  Home Medications   Prior to Admission medications   Medication Sig Start Date End Date Taking? Authorizing Provider  acetaminophen (TYLENOL) 500 MG tablet Take 1 tablet (500 mg total) by mouth every 6 (six) hours as needed for pain. 08/22/12  Yes Dayna N Dunn, PA-C  amitriptyline (ELAVIL) 10 MG tablet TAKE (1) TABLET BY MOUTH ONCE DAILY. 03/05/15  Yes [name], MD  ascorbic acid (VITAMIN C) 500 MG tablet Take 500 mg by mouth 2 (two) times daily. Twice daily with iron tablet   Yes Historical Provider, MD  aspirin EC 81 MG tablet Take 81 mg by mouth every morning.    Yes Historical Provider, MD  atorvastatin (LIPITOR) 40 MG tablet Take 1 tablet (40 mg total) by mouth daily. 11/19/14  Yes Laqueta Linden, MD  cholecalciferol (VITAMIN D) 1000 UNITS tablet Take 1,000 Units by mouth daily.   Yes Historical Provider, MD  econazole nitrate 1 % cream Apply 1 application topically daily as needed. FOR FEET IRRITATION   Yes Historical Provider, MD  EFFIENT 5 MG TABS tablet TAKE (1) TABLET BY MOUTH ONCE DAILY. 03/14/15  Yes Laqueta Linden, MD  ferrous sulfate 324 (65 FE) MG TBEC Take 1 tablet by mouth 2 (two) times daily.    Yes Historical Provider, MD  furosemide (LASIX) 80 MG tablet Take 80 mg by mouth daily.  03/14/15  Yes Historical Provider, MD  glimepiride (AMARYL) 1 MG tablet TAKE (1/2) TABLET BY MOUTH DAILY. 01/31/15  Yes [name], MD  guaiFENesin (MUCINEX) 600 MG 12 hr  tablet Take 600 mg by mouth 2 (two) times daily.   Yes Historical Provider, MD  hydrOXYzine (ATARAX/VISTARIL) 25 MG tablet TAKE 1 TABLET EVERY 6 HOURS AS NEEDED FOR ITCHING OR ANXIETY. 05/04/14  Yes [name], MD  levothyroxine (SYNTHROID, LEVOTHROID) 50 MCG tablet TAKE 1 TABLET DAILY BEFORE BREAKFAST. 03/14/15  Yes [name], MD  meclizine (ANTIVERT) 25 MG tablet TAKE 1 TABLET 3 TIMES A DAY AS NEEDED FOR DIZZINESS.   Yes [name], MD  metoprolol tartrate (LOPRESSOR) 25 MG tablet TAKE (1) TABLET TWICE DAILY. 09/11/14  Yes Laqueta Linden, MD  nitroGLYCERIN (NITROSTAT) 0.4 MG SL tablet Place 1 tablet (0.4 mg total) under the tongue every 5 (five) minutes as needed for chest pain (up to 3 doses). 11/27/13  Yes Antoine Poche, MD  pantoprazole (PROTONIX) 40 MG tablet TAKE (1) TABLET TWICE DAILY. 12/20/14  Yes Laqueta Linden, MD  potassium chloride SA (KLOR-CON M20) 20 MEQ tablet Take 1 tablet (20 mEq total) by mouth daily. 03/25/15  Yes Jodelle Gross, NP  torsemide (DEMADEX) 20 MG tablet Take 1 tablet (20 mg total) by mouth daily. 03/28/15  Yes Jodelle Gross, NP  vitamin B-12 (CYANOCOBALAMIN) 1000 MCG tablet Take 1,000 mcg by mouth every morning.   Yes Historical Provider, MD  clindamycin (CLEOCIN) 150 MG capsule Take 1 capsule (150 mg total) by mouth 3 (three) times daily. Patient not taking: Reported on 06-02-15 03/29/15   Rolland Porter, MD  glucose blood (ONE TOUCH TEST STRIPS) test strip Test once daily. DX;  250.00 04/06/14   [name], MD  ONE [facility] LANCETS MISC Test once daily. Dx:  250.00 04/06/14   [name], MD   BP 109/41 mmHg  Pulse 96  Temp(Src) 98.2 F (36.8 C) (Oral)  Resp 17  Ht  (1.676 m)  Wt 175 lb (79.379 kg)  BMI 28.26 kg/m2  SpO2 100% Physical Exam  Constitutional: She appears well-developed and well-nourished. No distress.  HENT:  Head: Normocephalic and atraumatic.  Eyes: Conjunctivae are normal. Right eye exhibits no discharge.  Left eye exhibits no discharge.  Neck: Neck supple.  Cardiovascular: Normal rate, regular rhythm and normal heart sounds.  Exam reveals no gallop and no friction rub.   No murmur heard. Pulmonary/Chest: Effort normal and breath sounds normal. No respiratory distress.  Abdominal: Soft. She exhibits no distension. There is tenderness. There is no rebound and no guarding.  Mild, diffuse abdominal pain. No rebound or guarding. No distention.  Musculoskeletal: She exhibits no edema or tenderness.  Neurological: She is alert.  Skin: Skin is warm and dry.  Psychiatric: She has a normal mood and affect. Her behavior is normal. Thought content normal.  Nursing note and vitals reviewed.   ED Course  Procedures (including critical care time) Labs Review Labs Reviewed  CBC WITH DIFFERENTIAL/PLATELET - Abnormal; Notable for the following:    WBC 23.2 (*)    Platelets 130 (*)    Neutro Abs 21.6 (*)    All other components within normal limits  BASIC METABOLIC PANEL - Abnormal; Notable for the following:    Sodium 134 (*)    Potassium 2.9 (*)    Chloride 88 (*)    Glucose, Bld 105 (*)    BUN 26 (*)    Creatinine, Ser 1.31 (*)    Calcium 8.1 (*)    GFR calc non Af Amer 35 (*)    GFR calc Af Amer 41 (*)    Anion gap 16 (*)    All other components within normal limits  TROPONIN I - Abnormal; Notable for the following:    Troponin I 0.07 (*)    All other components within normal limits  URINALYSIS, ROUTINE W REFLEX MICROSCOPIC (NOT AT [facility])    Imaging Review No results found.   Ct Abdomen Pelvis Wo Contrast  2015-06-02  CLINICAL DATA:  Nausea. Clinical diagnosis of small bowel obstruction. Previous cholecystectomy and hernia repair. EXAM: CT ABDOMEN AND PELVIS WITHOUT CONTRAST TECHNIQUE: Multidetector CT imaging of the abdomen and pelvis was performed following the standard protocol without IV contrast. COMPARISON:  Radiographs obtained earlier today. FINDINGS: Lower chest:  No acute  findings. Hepatobiliary: Mildly lobulated liver contours with a mildly prominent lateral segment left lobe. Surgically  absent gallbladder. No visible biliary ductal dilatation. Pancreas: Diffusely atrophied and predominantly fatty replaced. Spleen: Within normal limits in size. Adrenals/Urinary Tract: No evidence of urolithiasis or hydronephrosis. No definite mass visualized on this un-enhanced exam. Stomach/Bowel: Small sliding hiatal hernia. Concentric, medium and low density wall thickening involving the splenic flexure, descending colon and proximal sigmoid colon with mild pericolonic soft tissue stranding. Similar changes are noted involving the cecum and inferior right colon. There is less wall thickening involving the transverse colon with mild gaseous distention of the transverse colon. Mildly prominent gas distended small bowel loops. Normal sized stomach. The appendix is not definitely identified. Vascular/Lymphatic: Atheromatous arterial calcifications, including the coronary arteries. Dense mitral valve annulus calcifications. No enlarged lymph nodes. Reproductive: No mass or other significant abnormality. Other: Small amount of free peritoneal fluid. Diffuse subcutaneous edema. Small to moderate-sized umbilical hernia containing fat. Musculoskeletal: Lumbar lower thoracic spine degenerative changes. These include facet degenerative changes with associated mild anterolisthesis at the L4-5 level. No pars defects or fractures are seen. IMPRESSION: 1. Pain colitis, most pronounced involving the cecum, and inferior right colon, splenic flexure and descending colon. This could be infectious or inflammatory nature. 2. Small amount of free peritoneal fluid. 3. Mild colonic and small bowel ileus. 4. Small hiatal hernia. 5. Extensive atheromatous arterial calcifications, including the coronary arteries. 6. Small to moderate-sized umbilical hernia containing fat. 7. Diffuse anasarca. 8. Possible early changes of  cirrhosis of the liver. Electronically Signed   By: Beckie Salts M.D.   On: 2015/05/30 15:29   I have personally reviewed and evaluated these images and lab results as part of my medical decision-making.   EKG Interpretation   Date/Time:  Saturday May 30, 2015 12:05:18 EST Ventricular Rate:  84 PR Interval:    QRS Duration: 86 QT Interval:  359 QTC Calculation: 424 R Axis:   130 Text Interpretation:  Atrial fibrillation Right axis deviation Borderline  repolarization abnormality Confirmed by Juleen China  MD, Alante Weimann ([id number]) on  2015/05/30 1:40:38 PM      MDM   Final diagnoses:  Nausea  Colitis   Annual female with chief complaint of nausea and vomiting. Mild diffuse tenderness on exam. CT significant for pan colitis. Significant leukocytosis. Ordered antibiotics for possible infectious etiology. Mildly elevated troponin clear significance. She denies any chest pain. Discussed with medicine for admisison.   Raeford Razor, MD 05/11/15 [phone]

## 2015-05-04 NOTE — ED Notes (Signed)
Pt is complaining of nausea and dry heaves since Monday. Pt denies other symptoms. Pt has 3 +  Pitting edema to lower legs. States this is normal

## 2015-05-04 NOTE — ED Notes (Signed)
Pt c/o IV hurting, IV removed. New IV attempted x 2, unsucesssful. Another nurse to attempt IV.

## 2015-05-04 NOTE — Progress Notes (Signed)
Patient c/o nausea, too soon for Zofran. Paged on call MD will follow any new orders received and continue to monitor.

## 2015-05-04 NOTE — H&P (Signed)
[facility] History and Physical  [name]:[id number] DOB: [birth year] DOA: 05/03/2015   PCP: [name], MD  Specialists: She is followed by cardiology as well. Dr. Bronson Ing  Chief Complaint: nauseous since Monday  HPI: Alexandra [name] is a 81 y.o. female with a past medical history of coronary artery disease, atrial fibrillation on anticoagulation, diabetes mellitus type 2 who was in her usual state of health until this past Monday when she started feeling nauseated. She had a few episodes of dry heaves. Denies any diarrhea, per se, but did have a soft stool in the emergency department. She's also had some vague abdominal pain. Patient is a very poor historian. Denies any history of fever or chills. Denies any chest pain. No discomfort with urination. She initially denied that she had been on any antibiotics recently. However, review of chart showed that she was prescribed clindamycin on October 28 for lower extremity cellulitis. Patient is accompanied by 2 friends. However, they're also not able to provide much information. Patient doesn't have any family in this area.  In the emergency department evaluation revealed a significantly elevated WBC, CT scan showed pancolitis and she had atrial fibrillation with mild RVR. Medicine was consulted for hospitalization.  Home Medications: Prior to Admission medications   Medication Sig Start Date End Date Taking? Authorizing Provider  acetaminophen (TYLENOL) 500 MG tablet Take 1 tablet (500 mg total) by mouth every 6 (six) hours as needed for pain. 08/22/12  Yes Dayna N Dunn, PA-C  amitriptyline (ELAVIL) 10 MG tablet TAKE (1) TABLET BY MOUTH ONCE DAILY. 03/05/15  Yes [name], MD  ascorbic acid (VITAMIN C) 500 MG tablet Take 500 mg by mouth 2 (two) times daily. Twice daily with iron tablet   Yes Historical Provider, MD  aspirin EC 81 MG tablet Take 81 mg by mouth every morning.    Yes Historical Provider, MD  atorvastatin  (LIPITOR) 40 MG tablet Take 1 tablet (40 mg total) by mouth daily. 11/19/14  Yes Herminio Commons, MD  cholecalciferol (VITAMIN D) 1000 UNITS tablet Take 1,000 Units by mouth daily.   Yes Historical Provider, MD  econazole nitrate 1 % cream Apply 1 application topically daily as needed. FOR FEET IRRITATION   Yes Historical Provider, MD  EFFIENT 5 MG TABS tablet TAKE (1) TABLET BY MOUTH ONCE DAILY. 03/14/15  Yes Herminio Commons, MD  ferrous sulfate 324 (65 FE) MG TBEC Take 1 tablet by mouth 2 (two) times daily.    Yes Historical Provider, MD  furosemide (LASIX) 80 MG tablet Take 80 mg by mouth daily.  03/14/15  Yes Historical Provider, MD  glimepiride (AMARYL) 1 MG tablet TAKE (1/2) TABLET BY MOUTH DAILY. 01/31/15  Yes [name], MD  guaiFENesin (MUCINEX) 600 MG 12 hr tablet Take 600 mg by mouth 2 (two) times daily.   Yes Historical Provider, MD  hydrOXYzine (ATARAX/VISTARIL) 25 MG tablet TAKE 1 TABLET EVERY 6 HOURS AS NEEDED FOR ITCHING OR ANXIETY. 05/04/14  Yes [name], MD  levothyroxine (SYNTHROID, LEVOTHROID) 50 MCG tablet TAKE 1 TABLET DAILY BEFORE BREAKFAST. 03/14/15  Yes [name], MD  meclizine (ANTIVERT) 25 MG tablet TAKE 1 TABLET 3 TIMES A DAY AS NEEDED FOR DIZZINESS.   Yes [name], MD  metoprolol tartrate (LOPRESSOR) 25 MG tablet TAKE (1) TABLET TWICE DAILY. 09/11/14  Yes Herminio Commons, MD  nitroGLYCERIN (NITROSTAT) 0.4 MG SL tablet Place 1 tablet (0.4 mg total) under the tongue every  5 (five) minutes as needed for chest pain (up to 3 doses). 11/27/13  Yes Arnoldo Lenis, MD  pantoprazole (PROTONIX) 40 MG tablet TAKE (1) TABLET TWICE DAILY. 12/20/14  Yes Herminio Commons, MD  potassium chloride SA (KLOR-CON M20) 20 MEQ tablet Take 1 tablet (20 mEq total) by mouth daily. 03/25/15  Yes Lendon Colonel, NP  torsemide (DEMADEX) 20 MG tablet Take 1 tablet (20 mg total) by mouth daily. 03/28/15  Yes Lendon Colonel, NP  vitamin B-12 (CYANOCOBALAMIN) 1000 MCG  tablet Take 1,000 mcg by mouth every morning.   Yes Historical Provider, MD  clindamycin (CLEOCIN) 150 MG capsule Take 1 capsule (150 mg total) by mouth 3 (three) times daily. Patient not taking: Reported on 05/25/2015 03/29/15   Tanna Furry, MD  glucose blood (ONE TOUCH TEST STRIPS) test strip Test once daily. DX;  250.00 04/06/14   [name], MD  ONE [facility] LANCETS MISC Test once daily. Dx:  250.00 04/06/14   [name], MD    Allergies:  Allergies  Allergen Reactions  . Codeine Other (See Comments)    Reaction: severe GI pain  . Diazepam Other (See Comments)    REACTION: causes pt unable to wake up after taking  . Lipitor [Atorvastatin Calcium] Other (See Comments)    Reaction: causes vertigo  . Penicillins Other (See Comments)    Reaction: unknown (childhood )  . Plavix [Clopidogrel Bisulfate]     Nonresponder  . Sulfonamide Derivatives Other (See Comments)    Reaction: unknown (childhood)  . Mango Flavor Rash    Past Medical History: Past Medical History  Diagnosis Date  . Asthmatic bronchitis   . Sarcoidosis ([place])     a. 1990s of lung tx with prednisone temporarily.  [name] HTN (hypertension)   . PVD (peripheral vascular disease) ([place])     a. 06/2012 Carotid U/S: < 50% bilat ICA stenosis.  . Hyperlipemia   . DM (diabetes mellitus) ([place])   . Obesity   . GERD (gastroesophageal reflux disease)   . Diverticulosis of colon   . IBS (irritable bowel syndrome)   . DJD (degenerative joint disease)     Hip and knee pain; hip bursitis; patellar subluxation  . Vitamin D deficiency disease   . Fibromyalgia   . Anxiety     a. Guaiac neg 03/2012.  . Intermittent vertigo   . Anemia   . Venous insufficiency   . PSVT (paroxysmal supraventricular tachycardia) ([place])     a. Prior hx of palpitations - developed SVT during stress Myoview 09/2011. b. Noted on tele 03/2012.  . Valvular heart disease     a. moderately thickened/mildly calcified aortic valve, mod MR 03/20/12  . CKD  (chronic kidney disease), stage II   . PAC (premature atrial contraction)   . CAD (coronary artery disease) 03/2011    a. NSTEMI 27/7412 complicated by pulm edema s/p DES to subtotal LAD. b. [place] s/p PTCA/cutting balloon to prox LAD for ISR 12/01/11 (med rx for RCA/Cx dz).  c. NSTEMI 03/2012 s/p cutting angioplasty of ISR of prox LAD 03/21/12 (PLAVIX NONRESPONDER). d. DES to LAD for ISR 07/2012 (pt refused consideration of CABG).  [name] A-fib ([place])     a. Noted 07/2012 - not on anticoag due for need for DAPT and age.  . Thrombocytopenia ([place])     a. Noted 07/2012.  . On home oxygen therapy     2L/min with ambulation as of 07/2012.    Past Surgical History  Procedure Laterality Date  . Cholecystectomy    . Tonsillectomy    . Hernia repair    . Cataract extraction    . Coronary angioplasty with stent placement  03/21/2012  . Left heart catheterization with coronary angiogram N/A 12/01/2011    Procedure: LEFT HEART CATHETERIZATION WITH CORONARY ANGIOGRAM;  Surgeon: Peter M Martinique, MD;  Location: [facility] CATH LAB;  Service: Cardiovascular;  Laterality: N/A;  . Percutaneous coronary stent intervention (pci-s)  12/01/2011    Procedure: PERCUTANEOUS CORONARY STENT INTERVENTION (PCI-S);  Surgeon: Peter M Martinique, MD;  Location: [facility] CATH LAB;  Service: Cardiovascular;;  . Left heart catheterization with coronary angiogram N/A 03/21/2012    Procedure: LEFT HEART CATHETERIZATION WITH CORONARY ANGIOGRAM;  Surgeon: Larey Dresser, MD;  Location: [facility] CATH LAB;  Service: Cardiovascular;  Laterality: N/A;  . Percutaneous coronary stent intervention (pci-s)  03/21/2012    Procedure: PERCUTANEOUS CORONARY STENT INTERVENTION (PCI-S);  Surgeon: Burnell Blanks, MD;  Location: [facility] C[facility] LAB;  Service: Cardiovascular;;  . Left heart catheterization with coronary angiogram N/A 08/18/2012    Procedure: LEFT HEART CATHETERIZATION WITH CORONARY ANGIOGRAM;  Surgeon: Burnell Blanks, MD;  Location: [facility] CATH LAB;  Service:  Cardiovascular;  Laterality: N/A;  . Percutaneous coronary stent intervention (pci-s)  08/18/2012    Procedure: PERCUTANEOUS CORONARY STENT INTERVENTION (PCI-S);  Surgeon: Burnell Blanks, MD;  Location: [facility] CATH LAB;  Service: Cardiovascular;;   des to prox lad with ivus and cutting balloon to existing lad stent     Social History: patient lives in [place] in a senior living apartment complex. Denies any history of smoking, alcohol use or illicit drug use. Uses a walker to ambulate.  Family History:  Family History  Problem Relation Age of Onset  . Heart failure Father     died at 52  . Leukemia Mother     died at 59     Review of Systems - unable to do as she is a poor historian.  Physical Examination  Filed Vitals:   05/31/2015 1430 05/27/2015 1445 06/01/2015 1500 05/03/2015 1515  BP: 108/80  95/70   Pulse: 95 98 102 104  Temp:      TempSrc:      Resp: _0 Height:      Weight:      SpO2: 99% 99% 98% 100%    BP 95/70 mmHg  Pulse 104  Temp(Src) 98.2 F (36.8 C) (Oral)  Resp 17  Ht 5' 6" (1.676 m)  Wt 79.379 kg (175 lb)  BMI 28.26 kg/m2  SpO2 100%  General appearance: cooperative, appears stated age, distracted and no distress Head: Normocephalic, without obvious abnormality, atraumatic Eyes: conjunctivae/corneas clear. PERRL, EOM's intact.  Throat: extremely dry mucous membranes Neck: no adenopathy, no carotid bruit, no JVD, supple, symmetrical, trachea midline and thyroid not enlarged, symmetric, no tenderness/mass/nodules Resp: clear to auscultation bilaterally Cardio: S1, S2 is irregularly irregular. No S3, S4. No rubs, murmurs or bruit. 1-2+ pitting edema bilateral lower extremities GI: soft. Vague mild tenderness diffusely without any rebound, rigidity or guarding. No masses or organomegaly. Bowel sounds are present. Extremities: 1-2+ pitting edema bilateral lower extremities Pulses: 2+ and symmetric Skin: chronic skin changes lower  extremities Lymph nodes: Cervical, supraclavicular, and axillary nodes normal. Neurologic: alert. Distracted. No obvious focal neurological deficits.  Laboratory Data: Results for orders placed or performed during the hospital encounter of 05/19/2015 (from the past 48 hour(s))  CBC with Differential  Status: Abnormal   Collection Time: 05/11/2015 12:15 PM  Result Value Ref Range   WBC 23.2 (H) 4.0 - 10.5 K/uL   RBC 4.47 3.87 - 5.11 MIL/uL   Hemoglobin 13.9 12.0 - 15.0 g/dL   HCT 41.0 36.0 - 46.0 %   MCV 91.7 78.0 - 100.0 fL   MCH 31.1 26.0 - 34.0 pg   MCHC 33.9 30.0 - 36.0 g/dL   RDW 15.2 11.5 - 15.5 %   Platelets 130 (L) 150 - 400 K/uL   Neutrophils Relative % 93 %   Lymphocytes Relative 3 %   Monocytes Relative 4 %   Eosinophils Relative 0 %   Basophils Relative 0 %   Neutro Abs 21.6 (H) 1.7 - 7.7 K/uL   Lymphs Abs 0.7 0.7 - 4.0 K/uL   Monocytes Absolute 0.9 0.1 - 1.0 K/uL   Eosinophils Absolute 0.0 0.0 - 0.7 K/uL   Basophils Absolute 0.0 0.0 - 0.1 K/uL   WBC Morphology MILD LEFT SHIFT (1-5% METAS, OCC MYELO, OCC BANDS)   Basic metabolic panel     Status: Abnormal   Collection Time: 05/18/2015 12:15 PM  Result Value Ref Range   Sodium 134 (L) 135 - 145 mmol/L   Potassium 2.9 (L) 3.5 - 5.1 mmol/L   Chloride 88 (L) 101 - 111 mmol/L   CO2 30 22 - 32 mmol/L   Glucose, Bld 105 (H) 65 - 99 mg/dL   BUN 26 (H) 6 - 20 mg/dL   Creatinine, Ser 1.31 (H) 0.44 - 1.00 mg/dL   Calcium 8.1 (L) 8.9 - 10.3 mg/dL   GFR calc non Af Amer 35 (L) >60 mL/min   GFR calc Af Amer 41 (L) >60 mL/min    Comment: (NOTE) The eGFR has been calculated using the CKD EPI equation. This calculation has not been validated in all clinical situations. eGFR's persistently <60 mL/min signify possible Chronic Kidney Disease.    Anion gap 16 (H) 5 - 15  Troponin I     Status: Abnormal   Collection Time: 05/31/2015 12:15 PM  Result Value Ref Range   Troponin I 0.07 (H) <0.031 ng/mL    Comment:          PERSISTENTLY INCREASED TROPONIN VALUES IN THE RANGE OF 0.04-0.49 ng/mL CAN BE SEEN IN:       -UNSTABLE ANGINA       -CONGESTIVE HEART FAILURE       -MYOCARDITIS       -CHEST TRAUMA       -ARRYHTHMIAS       -LATE PRESENTING MYOCARDIAL INFARCTION       -COPD   CLINICAL FOLLOW-UP RECOMMENDED.     Radiology Reports: Ct Abdomen Pelvis Wo Contrast  05/15/2015  CLINICAL DATA:  Nausea. Clinical diagnosis of small bowel obstruction. Previous cholecystectomy and hernia repair. EXAM: CT ABDOMEN AND PELVIS WITHOUT CONTRAST TECHNIQUE: Multidetector CT imaging of the abdomen and pelvis was performed following the standard protocol without IV contrast. COMPARISON:  Radiographs obtained earlier today. FINDINGS: Lower chest:  No acute findings. Hepatobiliary: Mildly lobulated liver contours with a mildly prominent lateral segment left lobe. Surgically absent gallbladder. No visible biliary ductal dilatation. Pancreas: Diffusely atrophied and predominantly fatty replaced. Spleen: Within normal limits in size. Adrenals/Urinary Tract: No evidence of urolithiasis or hydronephrosis. No definite mass visualized on this un-enhanced exam. Stomach/Bowel: Small sliding hiatal hernia. Concentric, medium and low density wall thickening involving the splenic flexure, descending colon and proximal sigmoid colon with mild pericolonic soft tissue  stranding. Similar changes are noted involving the cecum and inferior right colon. There is less wall thickening involving the transverse colon with mild gaseous distention of the transverse colon. Mildly prominent gas distended small bowel loops. Normal sized stomach. The appendix is not definitely identified. Vascular/Lymphatic: Atheromatous arterial calcifications, including the coronary arteries. Dense mitral valve annulus calcifications. No enlarged lymph nodes. Reproductive: No mass or other significant abnormality. Other: Small amount of free peritoneal fluid. Diffuse subcutaneous  edema. Small to moderate-sized umbilical hernia containing fat. Musculoskeletal: Lumbar lower thoracic spine degenerative changes. These include facet degenerative changes with associated mild anterolisthesis at the L4-5 level. No pars defects or fractures are seen. IMPRESSION: 1. Pain colitis, most pronounced involving the cecum, and inferior right colon, splenic flexure and descending colon. This could be infectious or inflammatory nature. 2. Small amount of free peritoneal fluid. 3. Mild colonic and small bowel ileus. 4. Small hiatal hernia. 5. Extensive atheromatous arterial calcifications, including the coronary arteries. 6. Small to moderate-sized umbilical hernia containing fat. 7. Diffuse anasarca. 8. Possible early changes of cirrhosis of the liver. Electronically Signed   By: Claudie Revering M.D.   On: 05/23/2015 15:29   Dg Abd 2 Views  05/03/2015  CLINICAL DATA:  Nausea. History of atrial fibrillation, coronary artery disease, hypertension, diabetes. EXAM: ABDOMEN - 2 VIEW COMPARISON:  None. FINDINGS: Small bowel within the central abdomen and left lower quadrant is distended with gas, mild to moderate in degree. Relatively decompressed small bowel is identified in the right lower quadrant. No evidence of soft tissue mass or abnormal fluid collection. No free intraperitoneal air seen. There are extensive atherosclerotic changes of the splenic artery. Degenerative changes are noted throughout the thoracolumbar spine, at least moderate in degree. No acute- appearing osseous abnormality identified. Dense tubular calcific-appearing structure is again identified over the left heart border, previously described as mitral valve calcification. IMPRESSION: Distended gas-filled loops of small bowel throughout the central abdomen and left lower quadrant, mild to moderate in degree, suspicious for developing small bowel obstruction. Would consider CT for confirmation. Additional chronic/incidental findings detailed  above. Electronically Signed   By: Franki Cabot M.D.   On: 05/12/2015 13:42    My interpretation of Electrocardiogram: atrial fibrillation at 84 bpm. Normal axis. Normal intervals. Nonspecific T changes. No concerning ST changes.  Problem List  Principal Problem:   Colitis Active Problems:   Sarcoidosis ([place])   Diabetes mellitus, type II ([place])   Hypertension   Arteriosclerotic cardiovascular disease (ASCVD)   Atrial fibrillation ([place])   Assessment: This is a 81 year old female with past medical history as stated earlier, who presents with nausea for the last 6 days and is found to have CT evidence of pan colitis. It appears the patient was recently prescribed clindamycin for lower extremity cellulitis. However, patient hasn't had any significant diarrhea that she mentions. History at the same time is not fully reliable. UA does not suggest infection. She does have evidence for sepsis with colitis being the source.  Plan: #1 Pancolitis with sepsis: She'll be hospitalized. Lactic acid level and blood cultures will be obtained. She'll be placed on ciprofloxacin and Flagyl for now. Stool studies will be ordered. Clinical suspicion for C. Difficile remains high, even though she hasn't had any diarrhea per se based on history. If patient gets worse or doesn't improve as anticipated there should be low threshold for initiating oral vancomycin. Check pro-calcitonin.  #2 Mildly elevated troponin: Most likely secondary to acute illness. Patient denies any chest pain  per se. EKG does not show any ischemic changes. She does have a known history of coronary artery disease and atrial fibrillation. We will continue to trend her troponins. Monitor her on telemetry.  #3 history of chronic atrial fibrillation: She does have mild RVR. Continue with her beta blocker. Give gentle IV hydration. She is not on anticoagulation due to the fact that she is on dual antiplatelet agents.  #4 history of coronary artery  disease: Stable. See above. She is followed by cardiology closely. She is on dual antiplatelet agents. She has a history of non-ST elevation MI in 2014. At that time CABG was offered but the patient refused. Continue medical management.  #5 history of diabetes mellitus type 2: Hold her oral agent. Sliding scale insulin coverage will be initiated. Last available. HbA1c is from February, which was 6.7.  #6 Mild hyponatremia and hypokalemia in the setting of chronic kidney disease stage III: Potassium has been repleted in the emergency department. She will be given gentle IV hydration.  #7 History of chronic diastolic CHF: She does have lower extremity edema. However, she appears to be intravascularly depleted. Give her gentle hydration for now. Hold Lasix for now. Resume from tomorrow.  DVT Prophylaxis: subcutaneous heparin Code Status: Full Code Family Communication: discussed with the patient.  Disposition Plan: admit to telemetry   Further management decisions will depend on results of further testing and patient's response to treatment.   [facility]  [facility] Pager [phone]  If 7PM-7AM, please contact night-coverage [email or web address] Password [facility]  05/09/2015, 4:41 PM

## 2015-05-05 ENCOUNTER — Inpatient Hospital Stay (HOSPITAL_COMMUNITY): Payer: Medicare Other

## 2015-05-05 DIAGNOSIS — A0472 Enterocolitis due to Clostridium difficile, not specified as recurrent: Secondary | ICD-10-CM | POA: Diagnosis present

## 2015-05-05 DIAGNOSIS — A419 Sepsis, unspecified organism: Secondary | ICD-10-CM | POA: Diagnosis present

## 2015-05-05 DIAGNOSIS — N183 Chronic kidney disease, stage 3 (moderate): Secondary | ICD-10-CM

## 2015-05-05 DIAGNOSIS — E1122 Type 2 diabetes mellitus with diabetic chronic kidney disease: Secondary | ICD-10-CM

## 2015-05-05 LAB — GLUCOSE, CAPILLARY
GLUCOSE-CAPILLARY: 84 mg/dL (ref 65–99)
GLUCOSE-CAPILLARY: 89 mg/dL (ref 65–99)
GLUCOSE-CAPILLARY: 105 mg/dL — AB (ref 65–99)
Glucose-Capillary: 85 mg/dL (ref 65–99)
Glucose-Capillary: 76 mg/dL (ref 65–99)

## 2015-05-05 LAB — CBC
HCT: 42.1 % (ref 36.0–46.0)
HEMOGLOBIN: 14.1 g/dL (ref 12.0–15.0)
MCH: 30.9 pg (ref 26.0–34.0)
MCHC: 33.5 g/dL (ref 30.0–36.0)
MCV: 92.3 fL (ref 78.0–100.0)
PLATELETS: 127 10*3/uL — AB (ref 150–400)
RBC: 4.56 MIL/uL (ref 3.87–5.11)
RDW: 15.4 % (ref 11.5–15.5)
WBC: 25.5 10*3/uL — AB (ref 4.0–10.5)

## 2015-05-05 LAB — TROPONIN I
Troponin I: 0.06 ng/mL — ABNORMAL HIGH (ref <0.031)
Troponin I: 0.06 ng/mL — ABNORMAL HIGH (ref <0.031)

## 2015-05-05 LAB — C DIFFICILE QUICK SCREEN W PCR REFLEX
C Diff antigen: POSITIVE — AB
C Diff interpretation: POSITIVE
C Diff toxin: POSITIVE — AB

## 2015-05-05 LAB — COMPREHENSIVE METABOLIC PANEL
ALK PHOS: 78 U/L (ref 38–126)
ALT: 13 U/L — AB (ref 14–54)
ANION GAP: 15 (ref 5–15)
AST: 21 U/L (ref 15–41)
Albumin: 2.7 g/dL — ABNORMAL LOW (ref 3.5–5.0)
BUN: 27 mg/dL — ABNORMAL HIGH (ref 6–20)
CALCIUM: 7.8 mg/dL — AB (ref 8.9–10.3)
CHLORIDE: 93 mmol/L — AB (ref 101–111)
CO2: 27 mmol/L (ref 22–32)
CREATININE: 1.28 mg/dL — AB (ref 0.44–1.00)
GFR, EST AFRICAN AMERICAN: 42 mL/min — AB (ref >60)
GFR, EST NON AFRICAN AMERICAN: 36 mL/min — AB (ref >60)
Glucose, Bld: 71 mg/dL (ref 65–99)
Potassium: 3 mmol/L — ABNORMAL LOW (ref 3.5–5.1)
SODIUM: 135 mmol/L (ref 135–145)
Total Bilirubin: 1.9 mg/dL — ABNORMAL HIGH (ref 0.3–1.2)
Total Protein: 5.8 g/dL — ABNORMAL LOW (ref 6.5–8.1)

## 2015-05-05 LAB — LACTIC ACID, PLASMA: LACTIC ACID, VENOUS: 1.3 mmol/L (ref 0.5–2.0)

## 2015-05-05 MED ORDER — METOCLOPRAMIDE HCL 5 MG/ML IJ SOLN
5.0000 mg | Freq: Once | INTRAMUSCULAR | Status: AC
  Administered 2015-05-05: 5 mg via INTRAVENOUS
  Filled 2015-05-05: qty 2

## 2015-05-05 MED ORDER — SACCHAROMYCES BOULARDII 250 MG PO CAPS
250.0000 mg | ORAL_CAPSULE | Freq: Two times a day (BID) | ORAL | Status: DC
  Administered 2015-05-05 – 2015-05-06 (×3): 250 mg via ORAL
  Filled 2015-05-05 (×4): qty 1

## 2015-05-05 MED ORDER — VANCOMYCIN 50 MG/ML ORAL SOLUTION
250.0000 mg | Freq: Four times a day (QID) | ORAL | Status: DC
  Administered 2015-05-05 – 2015-05-06 (×6): 250 mg via ORAL
  Filled 2015-05-05 (×13): qty 5

## 2015-05-05 MED ORDER — FUROSEMIDE 10 MG/ML IJ SOLN: 40.0000 mg | Freq: Every day | INTRAMUSCULAR | Status: DC

## 2015-05-05 MED ORDER — METOPROLOL TARTRATE 1 MG/ML IV SOLN
2.5000 mg | Freq: Four times a day (QID) | INTRAVENOUS | Status: DC
Start: 2015-05-05 — End: 2015-05-07
  Administered 2015-05-05 – 2015-05-06 (×6): 2.5 mg via INTRAVENOUS
  Filled 2015-05-05 (×7): qty 5

## 2015-05-05 MED ORDER — POTASSIUM CHLORIDE CRYS ER 20 MEQ PO TBCR
40.0000 meq | EXTENDED_RELEASE_TABLET | ORAL | Status: AC
  Filled 2015-05-05: qty 2

## 2015-05-05 MED ORDER — MORPHINE SULFATE (PF) 2 MG/ML IV SOLN
1.0000 mg | INTRAVENOUS | Status: DC | PRN
  Administered 2015-05-05 – 2015-05-06 (×3): 1 mg via INTRAVENOUS
  Filled 2015-05-05 (×4): qty 1

## 2015-05-05 MED ORDER — DEXTROSE-NACL 5-0.45 % IV SOLN
INTRAVENOUS | Status: DC
  Administered 2015-05-05 – 2015-05-06 (×2): via INTRAVENOUS

## 2015-05-05 MED ORDER — SODIUM CHLORIDE 0.9 % IV BOLUS (SEPSIS)
500.0000 mL | Freq: Once | INTRAVENOUS | Status: AC
  Administered 2015-05-05: 500 mL via INTRAVENOUS

## 2015-05-05 MED ORDER — FUROSEMIDE 40 MG PO TABS
40.0000 mg | ORAL_TABLET | Freq: Every day | ORAL | Status: DC
  Filled 2015-05-05: qty 1

## 2015-05-05 MED ORDER — POTASSIUM CHLORIDE 10 MEQ/100ML IV SOLN
10.0000 meq | INTRAVENOUS | Status: AC
  Administered 2015-05-05 (×4): 10 meq via INTRAVENOUS
  Filled 2015-05-05: qty 100

## 2015-05-05 NOTE — Progress Notes (Signed)
[facility] PROGRESS NOTE  DLISA BARNWELL [name]:[id number] DOB: 16-Feb-1924 DOA: 05/26/2015  PCP: [name], MD  Brief HPI: 81 year old female lives in the apartment complex meant for seniors who presented with nausea. She also had abdominal pain. She was an extremely poor historian. CT scan revealed pan colitis. She had recently taken clindamycin for cellulitis.  Past medical history:  Past Medical History  Diagnosis Date  . Asthmatic bronchitis   . Sarcoidosis (HCC)     a. 1990s of lung tx with prednisone temporarily.  [name] HTN (hypertension)   . PVD (peripheral vascular disease) (HCC)     a. 06/2012 Carotid U/S: < 50% bilat ICA stenosis.  . Hyperlipemia   . DM (diabetes mellitus) (HCC)   . Obesity   . GERD (gastroesophageal reflux disease)   . Diverticulosis of colon   . IBS (irritable bowel syndrome)   . DJD (degenerative joint disease)     Hip and knee pain; hip bursitis; patellar subluxation  . Vitamin D deficiency disease   . Fibromyalgia   . Anxiety     a. Guaiac neg 03/2012.  . Intermittent vertigo   . Anemia   . Venous insufficiency   . PSVT (paroxysmal supraventricular tachycardia) (HCC)     a. Prior hx of palpitations - developed SVT during stress Myoview 09/2011. b. Noted on tele 03/2012.  . Valvular heart disease     a. moderately thickened/mildly calcified aortic valve, mod MR 03/20/12  . CKD (chronic kidney disease), stage II   . PAC (premature atrial contraction)   . CAD (coronary artery disease) 03/2011    a. NSTEMI 03/2011 complicated by pulm edema s/p DES to subtotal LAD. b. [place] s/p PTCA/cutting balloon to prox LAD for ISR 12/01/11 (med rx for RCA/Cx dz).  c. NSTEMI 03/2012 s/p cutting angioplasty of ISR of prox LAD 03/21/12 (PLAVIX NONRESPONDER). d. DES to LAD for ISR 07/2012 (pt refused consideration of CABG).  [name] A-fib (HCC)     a. Noted 07/2012 - not on anticoag due for need for DAPT and age.  . Thrombocytopenia (HCC)     a. Noted 07/2012.  . On home  oxygen therapy     2L/min with ambulation as of 07/2012.    Consultants: None  Procedures: None  Antibiotics: Started on Cipro and Flagyl. On 12/3 Vancomycin added on 12/4 Ciprofloxacin discontinued on 12/4  Subjective: Patient for some reason does not answer questions appropriately. She keeps on rambling about other topics. It is very difficult to get her to focus on what I'm trying to ask. She did tell me that she's been having diarrhea all night. This was corroborated by the nursing staff. Feels very thirsty currently.  Objective: Vital Signs  Filed Vitals:   05/10/2015 1850 05/05/2015 1950 05/19/2015 2050 05/05/15 0501  BP: 94/42 118/54 109/45 136/57  Pulse: 90 100 97 106  Temp: 98.3 F (36.8 C) 98.2 F (36.8 C) 98.2 F (36.8 C) 98.3 F (36.8 C)  TempSrc: Oral Oral Oral Oral  Resp: Height:      Weight:      SpO2: 100% 100% 98% 97%   No intake or output data in the 24 hours ending 05/05/15 1009 Filed Weights   05/21/2015 1154 05/19/2015 1804  Weight: 79.379 kg (175 lb) 79.561 kg (175 lb 6.4 oz)    General appearance: alert, cooperative, appears stated age and no distress Resp: clear to auscultation bilaterally Cardio: regular rate and rhythm, S1, S2 normal, no  murmur, click, rub or gallop GI: Soft. Mildly tender diffusely without any rebound, rigidity or guarding. No masses or organomegaly. Bowel sounds are present. Extremities: edema 2+ in the lower extremities Pulses: 2+ and symmetric Neurologic: She is alert. She is oriented to place, person and time. No facial asymmetry. Moving all her extremities. Does have generalized weakness.  Lab Results:  Basic Metabolic Panel:  Recent Labs Lab 05/19/2015 1215 05/05/15 0548  NA 134* 135  K 2.9* 3.0*  CL 88* 93*  CO2 30 27  GLUCOSE 105* 71  BUN 26* 27*  CREATININE 1.31* 1.28*  CALCIUM 8.1* 7.8*   Liver Function Tests:  Recent Labs Lab 05/05/15 0548  AST 21  ALT 13*  ALKPHOS 78  BILITOT 1.9*  PROT  5.8*  ALBUMIN 2.7*   CBC:  Recent Labs Lab 05/22/2015 1215 05/05/15 0548  WBC 23.2* 25.5*  NEUTROABS 21.6*  --   HGB 13.9 14.1  HCT 41.0 42.1  MCV 91.7 92.3  PLT 130* 127*   Cardiac Enzymes:  Recent Labs Lab 05/20/2015 1215 05/13/2015 2106 05/05/15 0029 05/05/15 0548  TROPONINI 0.07* 0.06* 0.06* 0.06*   CBG:  Recent Labs Lab 05/10/2015 1803 05/26/2015 2126 05/05/15 0849  GLUCAP 102* 84 85    Recent Results (from the past 240 hour(s))  Culture, blood (x 2)     Status: None (Preliminary result)   Collection Time: 05/20/2015  9:06 PM  Result Value Ref Range Status   Specimen Description LEFT ANTECUBITAL  Final   Special Requests BOTTLES DRAWN AEROBIC AND ANAEROBIC 6CC  Final   Culture PENDING  Incomplete   Report Status PENDING  Incomplete  Culture, blood (x 2)     Status: None (Preliminary result)   Collection Time: 05/29/2015  9:11 PM  Result Value Ref Range Status   Specimen Description LEFT ANTECUBITAL  Final   Special Requests BOTTLES DRAWN AEROBIC AND ANAEROBIC 6CC  Final   Culture PENDING  Incomplete   Report Status PENDING  Incomplete  C difficile quick scan w PCR reflex     Status: Abnormal   Collection Time: 05/05/15  4:05 AM  Result Value Ref Range Status   C Diff antigen POSITIVE (A) NEGATIVE Final   C Diff toxin POSITIVE (A) NEGATIVE Final   C Diff interpretation Positive for toxigenic C. difficile  Final    Comment: CRITICAL RESULT CALLED TO, READ BACK BY AND VERIFIED WITH: BULLINS, M AT 0918 ON 05/05/2015 BY WOODS, M       Studies/Results: Ct Abdomen Pelvis Wo Contrast  05/25/2015  CLINICAL DATA:  Nausea. Clinical diagnosis of small bowel obstruction. Previous cholecystectomy and hernia repair. EXAM: CT ABDOMEN AND PELVIS WITHOUT CONTRAST TECHNIQUE: Multidetector CT imaging of the abdomen and pelvis was performed following the standard protocol without IV contrast. COMPARISON:  Radiographs obtained earlier today. FINDINGS: Lower chest:  No acute findings.  Hepatobiliary: Mildly lobulated liver contours with a mildly prominent lateral segment left lobe. Surgically absent gallbladder. No visible biliary ductal dilatation. Pancreas: Diffusely atrophied and predominantly fatty replaced. Spleen: Within normal limits in size. Adrenals/Urinary Tract: No evidence of urolithiasis or hydronephrosis. No definite mass visualized on this un-enhanced exam. Stomach/Bowel: Small sliding hiatal hernia. Concentric, medium and low density wall thickening involving the splenic flexure, descending colon and proximal sigmoid colon with mild pericolonic soft tissue stranding. Similar changes are noted involving the cecum and inferior right colon. There is less wall thickening involving the transverse colon with mild gaseous distention of the transverse colon. Mildly prominent  gas distended small bowel loops. Normal sized stomach. The appendix is not definitely identified. Vascular/Lymphatic: Atheromatous arterial calcifications, including the coronary arteries. Dense mitral valve annulus calcifications. No enlarged lymph nodes. Reproductive: No mass or other significant abnormality. Other: Small amount of free peritoneal fluid. Diffuse subcutaneous edema. Small to moderate-sized umbilical hernia containing fat. Musculoskeletal: Lumbar lower thoracic spine degenerative changes. These include facet degenerative changes with associated mild anterolisthesis at the L4-5 level. No pars defects or fractures are seen. IMPRESSION: 1. Pain colitis, most pronounced involving the cecum, and inferior right colon, splenic flexure and descending colon. This could be infectious or inflammatory nature. 2. Small amount of free peritoneal fluid. 3. Mild colonic and small bowel ileus. 4. Small hiatal hernia. 5. Extensive atheromatous arterial calcifications, including the coronary arteries. 6. Small to moderate-sized umbilical hernia containing fat. 7. Diffuse anasarca. 8. Possible early changes of cirrhosis  of the liver. Electronically Signed   By: Beckie Salts M.D.   On: 05/23/2015 15:29   Dg Abd 2 Views  05/15/2015  CLINICAL DATA:  Nausea. History of atrial fibrillation, coronary artery disease, hypertension, diabetes. EXAM: ABDOMEN - 2 VIEW COMPARISON:  None. FINDINGS: Small bowel within the central abdomen and left lower quadrant is distended with gas, mild to moderate in degree. Relatively decompressed small bowel is identified in the right lower quadrant. No evidence of soft tissue mass or abnormal fluid collection. No free intraperitoneal air seen. There are extensive atherosclerotic changes of the splenic artery. Degenerative changes are noted throughout the thoracolumbar spine, at least moderate in degree. No acute- appearing osseous abnormality identified. Dense tubular calcific-appearing structure is again identified over the left heart border, previously described as mitral valve calcification. IMPRESSION: Distended gas-filled loops of small bowel throughout the central abdomen and left lower quadrant, mild to moderate in degree, suspicious for developing small bowel obstruction. Would consider CT for confirmation. Additional chronic/incidental findings detailed above. Electronically Signed   By: Bary Richard M.D.   On: 05/16/2015 13:42    Medications:  Scheduled: . antiseptic oral rinse  7 mL Mouth Rinse BID  . aspirin EC  81 mg Oral q morning - 10a  . atorvastatin  40 mg Oral Daily  . ciprofloxacin  400 mg Intravenous Q12H  . furosemide  40 mg Oral Daily  . heparin  5,000 Units Subcutaneous 3 times per day  . Influenza vac split quadrivalent PF  0.5 mL Intramuscular Tomorrow-1000  . insulin aspart  0-9 Units Subcutaneous TID WC  . levothyroxine  50 mcg Oral QAC breakfast  . metoprolol tartrate  25 mg Oral BID  . metronidazole  500 mg Intravenous Q8H  . potassium chloride SA  40 mEq Oral Once  . potassium chloride  40 mEq Oral Q4H  . prasugrel  5 mg Oral Daily  . sodium chloride  3  mL Intravenous Q12H  . vancomycin  250 mg Oral 4 times per day  . vitamin B-12  1,000 mcg Oral q morning - 10a   Continuous: . dextrose 5 % and 0.45% NaCl     GNF:AOZHYQMVHQION **OR** acetaminophen, metoprolol, ondansetron **OR** ondansetron (ZOFRAN) IV, oxyCODONE, promethazine  Assessment/Plan:  Principal Problem:   C. difficile colitis Active Problems:   Sarcoidosis (HCC)   Diabetes mellitus, type II (HCC)   Hypertension   Arteriosclerotic cardiovascular disease (ASCVD)   Atrial fibrillation (HCC)    C. difficile colitis with sepsis  Patient was recently prescribed clindamycin for lower extremity cellulitis. This is the most likely reason for her  C. difficile infection. She was started on Cipro and Flagyl yesterday. Cipro will be discontinued. Oral vancomycin will be added due to severity of her illness. Add probiotics as well. Lactic acid was normal. Pro-calcitonin 0.34. Continue clear liquids for now.  Mildly elevated troponin Most likely secondary to acute illness/demand ischemia. Patient denies any chest pain. EKG did not show any ischemic changes. She does have a known history of coronary artery disease and atrial fibrillation. Troponin is not significantly elevated. No further workup anticipated.   History of chronic atrial fibrillation with mild RVR Heart rate improved with beta blocker. Heart rate was elevated, likely due to sepsis. She is not on anticoagulation due to the fact that she is on dual antiplatelet agents.  History of coronary artery disease Stable. See above. She is followed by cardiology closely. She is on dual antiplatelet agents. She has a history of non-ST elevation MI in 2014. At that time CABG was offered but the patient refused. Continue medical management.  History of diabetes mellitus type 2 Holding her oral agent. Blood glucose is borderline low. Continue Sliding scale insulin coverage. Last available HbA1c is from February, which was 6.7.  Mild  hyponatremia and hypokalemia in the setting of chronic kidney disease stage III Potassium to be repleted again today. Sodium has improved. Renal function is stable. Continue gentle IV hydration for another day.  History of chronic diastolic CHF She does have lower extremity edema. However, she appeared to be intravascularly depleted at the time of admission and she was given IV fluids. Okay to resume Lasix at a lower dose.   DVT Prophylaxis: Subcutaneous heparin    Code Status: Full code  Family Communication: Discussed with the patient. No family at bedside  Disposition Plan: Continue current treatment for now. PT and OT to evaluate starting tomorrow.    LOS: 1 day   Transformations Surgery CenterKRISHNAN,Hetty Linhart  [facility] Pager [phone] 05/05/2015, 10:09 AM  If 7PM-7AM, please contact night-coverage at [email or web address], password [facility]

## 2015-05-05 NOTE — Progress Notes (Signed)
Patient having nausea and dry heaves through out the day as well as small amount of emesis.  Unable to tolerated pill medications.  MD made aware, some meds changed to IV.  MD also made aware of patients low BP and increased HR this afternoon.  Orders to continue with IV metoprolol and give bolus. Will continue to monitor.

## 2015-05-05 NOTE — Progress Notes (Signed)
CRITICAL VALUE ALERT  Critical value received:  Positive c. diff  Date of notification:  05/05/15  Time of notification:  0930  Critical value read back:Yes.    Nurse who received alert:  M Dontel Harshberger RN  MD notified (1st page):  Dr. Krishnan  Time of first page:  0937  MD notified (2nd page):  Time of second page:  Responding MD:  Krishnan  Time MD responded:  Appropriate orders already in place. Dr. Paged and notified of positive confirmation 

## 2015-05-06 ENCOUNTER — Encounter (HOSPITAL_COMMUNITY): Payer: Self-pay | Admitting: Gastroenterology

## 2015-05-06 DIAGNOSIS — N179 Acute kidney failure, unspecified: Secondary | ICD-10-CM

## 2015-05-06 DIAGNOSIS — A047 Enterocolitis due to Clostridium difficile: Secondary | ICD-10-CM

## 2015-05-06 LAB — COMPREHENSIVE METABOLIC PANEL
ALBUMIN: 2.3 g/dL — AB (ref 3.5–5.0)
ALT: 17 U/L (ref 14–54)
AST: 36 U/L (ref 15–41)
Alkaline Phosphatase: 98 U/L (ref 38–126)
Anion gap: 13 (ref 5–15)
BUN: 37 mg/dL — AB (ref 6–20)
CHLORIDE: 94 mmol/L — AB (ref 101–111)
CO2: 23 mmol/L (ref 22–32)
CREATININE: 2.22 mg/dL — AB (ref 0.44–1.00)
Calcium: 7.2 mg/dL — ABNORMAL LOW (ref 8.9–10.3)
GFR calc Af Amer: 21 mL/min — ABNORMAL LOW (ref >60)
GFR calc non Af Amer: 18 mL/min — ABNORMAL LOW (ref >60)
GLUCOSE: 156 mg/dL — AB (ref 65–99)
Potassium: 3.9 mmol/L (ref 3.5–5.1)
SODIUM: 130 mmol/L — AB (ref 135–145)
Total Bilirubin: 1.9 mg/dL — ABNORMAL HIGH (ref 0.3–1.2)
Total Protein: 5.3 g/dL — ABNORMAL LOW (ref 6.5–8.1)

## 2015-05-06 LAB — GLUCOSE, CAPILLARY
GLUCOSE-CAPILLARY: 154 mg/dL — AB (ref 65–99)
GLUCOSE-CAPILLARY: 150 mg/dL — AB (ref 65–99)
Glucose-Capillary: 106 mg/dL — ABNORMAL HIGH (ref 65–99)
Glucose-Capillary: 112 mg/dL — ABNORMAL HIGH (ref 65–99)

## 2015-05-06 LAB — URINE MICROSCOPIC-ADD ON: RBC / HPF: NONE SEEN RBC/hpf (ref 0–5)

## 2015-05-06 LAB — CBC
HCT: 47.9 % — ABNORMAL HIGH (ref 36.0–46.0)
HCT: 46.6 % — ABNORMAL HIGH (ref 36.0–46.0)
Hemoglobin: 16.4 g/dL — ABNORMAL HIGH (ref 12.0–15.0)
Hemoglobin: 16.4 g/dL — ABNORMAL HIGH (ref 12.0–15.0)
MCH: 31.2 pg (ref 26.0–34.0)
MCH: 31.8 pg (ref 26.0–34.0)
MCHC: 34.2 g/dL (ref 30.0–36.0)
MCHC: 35.2 g/dL (ref 30.0–36.0)
MCV: 91.2 fL (ref 78.0–100.0)
MCV: 90.5 fL (ref 78.0–100.0)
PLATELETS: 149 10*3/uL — AB (ref 150–400)
Platelets: 159 10*3/uL (ref 150–400)
RBC: 5.25 MIL/uL — AB (ref 3.87–5.11)
RBC: 5.15 MIL/uL — AB (ref 3.87–5.11)
RDW: 15.3 % (ref 11.5–15.5)
RDW: 15.1 % (ref 11.5–15.5)
WBC: 49.7 10*3/uL — AB (ref 4.0–10.5)
WBC: 58.1 10*3/uL — AB (ref 4.0–10.5)

## 2015-05-06 LAB — CREATININE, URINE, RANDOM: CREATININE, URINE: 133.11 mg/dL

## 2015-05-06 LAB — URINALYSIS, ROUTINE W REFLEX MICROSCOPIC
GLUCOSE, UA: NEGATIVE mg/dL
Hgb urine dipstick: NEGATIVE
KETONES UR: NEGATIVE mg/dL
LEUKOCYTES UA: NEGATIVE
NITRITE: NEGATIVE
PH: 5 (ref 5.0–8.0)
Protein, ur: 30 mg/dL — AB

## 2015-05-06 LAB — SODIUM, URINE, RANDOM: Sodium, Ur: <10 mmol/L

## 2015-05-06 MED ORDER — DEXTROSE-NACL 5-0.9 % IV SOLN
INTRAVENOUS | Status: DC
Start: 2015-05-06 — End: 2015-05-07
  Administered 2015-05-06: 16:00:00 via INTRAVENOUS

## 2015-05-06 MED ORDER — PROMETHAZINE HCL 25 MG/ML IJ SOLN
12.5000 mg | Freq: Four times a day (QID) | INTRAMUSCULAR | Status: DC | PRN
  Administered 2015-05-06: 12.5 mg via INTRAVENOUS
  Filled 2015-05-06: qty 1

## 2015-05-06 MED ORDER — VANCOMYCIN HCL 500 MG IV SOLR
500.0000 mg | Freq: Four times a day (QID) | Status: DC
  Administered 2015-05-06: 500 mg via RECTAL
  Filled 2015-05-06 (×9): qty 500

## 2015-05-06 MED ORDER — SODIUM CHLORIDE 0.9 % IV BOLUS (SEPSIS)
500.0000 mL | Freq: Once | INTRAVENOUS | Status: AC
  Administered 2015-05-06: 500 mL via INTRAVENOUS

## 2015-05-06 NOTE — Progress Notes (Signed)
PT Cancellation Note  Patient Details Name: Alexandra SermonJudith T [name] MRN: [id number] DOB: November 21, 1923   Cancelled Treatment:    Reason Eval/Treat Not Completed: Patient not medically ready.  BP is quite low and pt c/o continued nausea.  Will try again tomorrow.   Myrlene BrokerBrown, Malik Ruffino L  PT 05/16/2015, 10:22 AM [phone]

## 2015-05-06 NOTE — Consult Note (Addendum)
Referring Provider: Dr. Rito Ehrlich Primary Care Physician:  [name], MD Primary Gastroenterologist:  Dr. Jena Gauss   Date of Admission: 05/05/2015 Date of Consultation: 06/02/2015  Reason for Consultation:  Clostridium difficile infection  HPI:  Alexandra [name] is an 81 y.o. year old female presenting on 12/3 nausea, vomiting, and diarrhea. Cdiff PCR quick scan noted positive Cdiff infection. CT with pancolitis, mild colonic and small bowel ileus. Further findings as outlined below. Leukocytosis with WBC 23.2, now 49.7. Creatinine worsening, now 2.22. Unknown baseline but appears to be in the 1.3-1.6 range. Exposure to antibiotics in October (clindamycin). Originally started on Cipro and Flagyl until Cdiff results returned, then Cipro discontinued and Vancomycin added. Currently on Vancomycin 250 mg QID and Flagyl 500 mg IV TID. Florastor BID. Currently, nausea resolved. 1 loose stool this morning thus far per nursing staff. Appears 4 BMs in last 24 hours. Reports epigastric pain, improved with medication.   Past Medical History  Diagnosis Date  . Asthmatic bronchitis   . Sarcoidosis (HCC)     a. 1990s of lung tx with prednisone temporarily.  [name] HTN (hypertension)   . PVD (peripheral vascular disease) (HCC)     a. 06/2012 Carotid U/S: < 50% bilat ICA stenosis.  . Hyperlipemia   . DM (diabetes mellitus) (HCC)   . Obesity   . GERD (gastroesophageal reflux disease)   . Diverticulosis of colon   . IBS (irritable bowel syndrome)   . DJD (degenerative joint disease)     Hip and knee pain; hip bursitis; patellar subluxation  . Vitamin D deficiency disease   . Fibromyalgia   . Anxiety     a. Guaiac neg 03/2012.  . Intermittent vertigo   . Anemia   . Venous insufficiency   . PSVT (paroxysmal supraventricular tachycardia) (HCC)     a. Prior hx of palpitations - developed SVT during stress Myoview 09/2011. b. Noted on tele 03/2012.  . Valvular heart disease     a. moderately thickened/mildly  calcified aortic valve, mod MR 03/20/12  . CKD (chronic kidney disease), stage II   . PAC (premature atrial contraction)   . CAD (coronary artery disease) 03/2011    a. NSTEMI 03/2011 complicated by pulm edema s/p DES to subtotal LAD. b. [place] s/p PTCA/cutting balloon to prox LAD for ISR 12/01/11 (med rx for RCA/Cx dz).  c. NSTEMI 03/2012 s/p cutting angioplasty of ISR of prox LAD 03/21/12 (PLAVIX NONRESPONDER). d. DES to LAD for ISR 07/2012 (pt refused consideration of CABG).  [name] A-fib (HCC)     a. Noted 07/2012 - not on anticoag due for need for DAPT and age.  . Thrombocytopenia (HCC)     a. Noted 07/2012.  . On home oxygen therapy     2L/min with ambulation as of 07/2012.    Past Surgical History  Procedure Laterality Date  . Cholecystectomy    . Tonsillectomy    . Hernia repair    . Cataract extraction    . Coronary angioplasty with stent placement  03/21/2012  . Left heart catheterization with coronary angiogram N/A 12/01/2011    Procedure: LEFT HEART CATHETERIZATION WITH CORONARY ANGIOGRAM;  Surgeon: Peter M Swaziland, MD;  Location: [facility] CATH LAB;  Service: Cardiovascular;  Laterality: N/A;  . Percutaneous coronary stent intervention (pci-s)  12/01/2011    Procedure: PERCUTANEOUS CORONARY STENT INTERVENTION (PCI-S);  Surgeon: Peter M Swaziland, MD;  Location: [facility] CATH LAB;  Service: Cardiovascular;;  . Left heart catheterization with coronary angiogram N/A 03/21/2012  Procedure: LEFT HEART CATHETERIZATION WITH CORONARY ANGIOGRAM;  Surgeon: Laurey Moralealton S McLean, MD;  [facility];  Service: Cardiovascular;  Laterality: N/A;  . Percutaneous coronary stent intervention (pci-s)  03/21/2012    Procedure: PERCUTANEOUS CORONARY STENT INTERVENTION (PCI-S);  Surgeon: Kathleene Hazelhristopher D McAlhany, MD;  [facility];  Service: Cardiovascular;;  . Left heart catheterization with coronary angiogram N/A 08/18/2012    Procedure: LEFT HEART CATHETERIZATION WITH CORONARY ANGIOGRAM;  Surgeon: Kathleene Hazelhristopher D  McAlhany, MD;  [facility];  Service: Cardiovascular;  Laterality: N/A;  . Percutaneous coronary stent intervention (pci-s)  08/18/2012    Procedure: PERCUTANEOUS CORONARY STENT INTERVENTION (PCI-S);  Surgeon: Kathleene Hazelhristopher D McAlhany, MD;  [facility];  Service: Cardiovascular;;   des to prox lad with ivus and cutting balloon to existing lad stent     Prior to Admission medications   Medication Sig Start Date End Date Taking? Authorizing Provider  acetaminophen (TYLENOL) 500 MG tablet Take 1 tablet (500 mg total) by mouth every 6 (six) hours as needed for pain. 08/22/12  Yes Dayna N Dunn, PA-C  amitriptyline (ELAVIL) 10 MG tablet TAKE (1) TABLET BY MOUTH ONCE DAILY. 03/05/15  Yes Michele McalpineScott M Nadel, MD  ascorbic acid (VITAMIN C) 500 MG tablet Take 500 mg by mouth 2 (two) times daily. Twice daily with iron tablet   Yes Historical Provider, MD  aspirin EC 81 MG tablet Take 81 mg by mouth every morning.    Yes Historical Provider, MD  atorvastatin (LIPITOR) 40 MG tablet Take 1 tablet (40 mg total) by mouth daily. 11/19/14  Yes Laqueta LindenSuresh A Koneswaran, MD  cholecalciferol (VITAMIN D) 1000 UNITS tablet Take 1,000 Units by mouth daily.   Yes Historical Provider, MD  econazole nitrate 1 % cream Apply 1 application topically daily as needed. FOR FEET IRRITATION   Yes Historical Provider, MD  EFFIENT 5 MG TABS tablet TAKE (1) TABLET BY MOUTH ONCE DAILY. 03/14/15  Yes Laqueta LindenSuresh A Koneswaran, MD  ferrous sulfate 324 (65 FE) MG TBEC Take 1 tablet by mouth 2 (two) times daily.    Yes Historical Provider, MD  furosemide (LASIX) 80 MG tablet Take 80 mg by mouth daily.  03/14/15  Yes Historical Provider, MD  glimepiride (AMARYL) 1 MG tablet TAKE (1/2) TABLET BY MOUTH DAILY. 01/31/15  Yes Michele McalpineScott M Nadel, MD  guaiFENesin (MUCINEX) 600 MG 12 hr tablet Take 600 mg by mouth 2 (two) times daily.   Yes Historical Provider, MD  hydrOXYzine (ATARAX/VISTARIL) 25 MG tablet TAKE 1 TABLET EVERY 6 HOURS AS NEEDED FOR ITCHING  OR ANXIETY. 05/04/14  Yes Michele McalpineScott M Nadel, MD  levothyroxine (SYNTHROID, LEVOTHROID) 50 MCG tablet TAKE 1 TABLET DAILY BEFORE BREAKFAST. 03/14/15  Yes Michele McalpineScott M Nadel, MD  meclizine (ANTIVERT) 25 MG tablet TAKE 1 TABLET 3 TIMES A DAY AS NEEDED FOR DIZZINESS.   Yes Michele McalpineScott M Nadel, MD  metoprolol tartrate (LOPRESSOR) 25 MG tablet TAKE (1) TABLET TWICE DAILY. 09/11/14  Yes Laqueta LindenSuresh A Koneswaran, MD  nitroGLYCERIN (NITROSTAT) 0.4 MG SL tablet Place 1 tablet (0.4 mg total) under the tongue every 5 (five) minutes as needed for chest pain (up to 3 doses). 11/27/13  Yes Antoine PocheJonathan F Branch, MD  pantoprazole (PROTONIX) 40 MG tablet TAKE (1) TABLET TWICE DAILY. 12/20/14  Yes Laqueta LindenSuresh A Koneswaran, MD  potassium chloride SA (KLOR-CON M20) 20 MEQ tablet Take 1 tablet (20 mEq total) by mouth daily. 03/25/15  Yes Jodelle GrossKathryn M Lawrence, NP  torsemide (DEMADEX) 20 MG tablet Take  1 tablet (20 mg total) by mouth daily. 03/28/15  Yes Jodelle Gross, NP  vitamin B-12 (CYANOCOBALAMIN) 1000 MCG tablet Take 1,000 mcg by mouth every morning.   Yes Historical Provider, MD  clindamycin (CLEOCIN) 150 MG capsule Take 1 capsule (150 mg total) by mouth 3 (three) times daily. Patient not taking: Reported on 05/28/2015 03/29/15   Rolland Porter, MD  glucose blood (ONE TOUCH TEST STRIPS) test strip Test once daily. DX;  250.00 04/06/14   [name], MD  ONE [facility] LANCETS MISC Test once daily. Dx:  250.00 04/06/14   [name], MD    Current Facility-Administered Medications  Medication Dose Route Frequency Provider Last Rate Last Dose  . acetaminophen (TYLENOL) tablet 650 mg  650 mg Oral Q6H PRN Osvaldo Shipper, MD       Or  . acetaminophen (TYLENOL) suppository 650 mg  650 mg Rectal Q6H PRN Osvaldo Shipper, MD      . antiseptic oral rinse (CPC / CETYLPYRIDINIUM CHLORIDE 0.05%) solution 7 mL  7 mL Mouth Rinse BID Osvaldo Shipper, MD   7 mL at 05/24/2015 1111  . aspirin EC tablet 81 mg  81 mg Oral q morning - 10a Osvaldo Shipper, MD   81 mg at  05/16/2015 1110  . dextrose 5 %-0.9 % sodium chloride infusion   Intravenous Continuous Osvaldo Shipper, MD      . heparin injection 5,000 Units  5,000 Units Subcutaneous 3 times per day Osvaldo Shipper, MD   5,000 Units at 05/28/2015 0534  . Influenza vac split quadrivalent PF (FLUARIX) injection 0.5 mL  0.5 mL Intramuscular Tomorrow-1000 Osvaldo Shipper, MD   0.5 mL at 05/05/15 1000  . insulin aspart (novoLOG) injection 0-9 Units  0-9 Units Subcutaneous TID WC Osvaldo Shipper, MD   2 Units at 05/29/2015 [phone]  . levothyroxine (SYNTHROID, LEVOTHROID) tablet 50 mcg  50 mcg Oral QAC breakfast Osvaldo Shipper, MD   50 mcg at 05/19/2015 0843  . metoprolol (LOPRESSOR) injection 2.5 mg  2.5 mg Intravenous 4 times per day Osvaldo Shipper, MD   2.5 mg at 05/24/2015 0535  . metroNIDAZOLE (FLAGYL) IVPB 500 mg  500 mg Intravenous Q8H Osvaldo Shipper, MD   500 mg at 05/09/2015 0843  . morphine 2 MG/ML injection 1 mg  1 mg Intravenous Q3H PRN Osvaldo Shipper, MD   1 mg at 05/02/2015 0431  . ondansetron (ZOFRAN) tablet 4 mg  4 mg Oral Q6H PRN Osvaldo Shipper, MD       Or  . ondansetron (ZOFRAN) injection 4 mg  4 mg Intravenous Q6H PRN Osvaldo Shipper, MD   4 mg at 05/10/2015 0430  . oxyCODONE (Oxy IR/ROXICODONE) immediate release tablet 5 mg  5 mg Oral Q4H PRN Osvaldo Shipper, MD   5 mg at 05/05/15 0506  . prasugrel (EFFIENT) tablet 5 mg  5 mg Oral Daily Osvaldo Shipper, MD   5 mg at 05/31/2015 1110  . promethazine (PHENERGAN) injection 12.5 mg  12.5 mg Intravenous Q6H PRN Osvaldo Shipper, MD   12.5 mg at 05/05/2015 0925  . saccharomyces boulardii (FLORASTOR) capsule 250 mg  250 mg Oral BID Osvaldo Shipper, MD   250 mg at 05/31/2015 1110  . sodium chloride 0.9 % bolus 500 mL  500 mL Intravenous Once Osvaldo Shipper, MD      . sodium chloride 0.9 % injection 3 mL  3 mL Intravenous Q12H Osvaldo Shipper, MD   3 mL at 05/31/2015 2215  . vancomycin (VANCOCIN) 50 mg/mL oral  solution 250 mg  250 mg Oral 4 times per day Osvaldo Shipper, MD   250 mg at 31-May-2015  0535    Allergies as of 05/12/2015 - Review Complete 05/22/2015  Allergen Reaction Noted  . Codeine Other (See Comments)   . Diazepam Other (See Comments)   . Lipitor [atorvastatin calcium] Other (See Comments) 09/10/2010  . Penicillins Other (See Comments)   . Plavix [clopidogrel bisulfate]  08/22/2012  . Sulfonamide derivatives Other (See Comments)   . Mango flavor Rash 03/20/2012    Family History  Problem Relation Age of Onset  . Heart failure Father     died at 19  . Leukemia Mother     died at 4  . Colon cancer Neg Hx     Social History   Social History  . Marital Status: Widowed    Spouse Name: N/A  . Number of Children: 1  . Years of Education: N/A   Occupational History  . Not on file.   Social History Main Topics  . Smoking status: Never Smoker   . Smokeless tobacco: Never Used  . Alcohol Use: No     Comment: social  . Drug Use: No  . Sexual Activity: No   Other Topics Concern  . Not on file   Social History Narrative   Pt is only child. Husband Duanne Guess passed away in 24-Oct-2002   Pt has 1 son in [place](his wife is MD doing [employer] )     Review of Systems: Gen: see HPI  CV: Denies chest pain, heart palpitations, syncope, edema  Resp: Denies shortness of breath with rest, cough, wheezing GI: see HPI  GU : Denies urinary burning, urinary frequency, urinary incontinence.  MS: Denies joint pain,swelling, cramping Derm: Denies rash, itching, dry skin Heme: Denies bruising, bleeding, and enlarged lymph nodes.  Physical Exam: Vital signs in last 24 hours: Temp:  [98 F (36.7 C)] 98 F (36.7 C) (12/05 0552) Pulse Rate:  [94-126] 104 (12/05 0552) Resp:  [20] 20 (12/05 0552) BP: (92-103)/(45-73) 101/45 mmHg (12/05 0552) SpO2:  [97 %-98 %] 97 % (12/05 0552) Last BM Date: 05/05/15 General:   Appears stated age, fatigued, alert and oriented to person, place, situation Head:  Normocephalic and atraumatic. Eyes:  Sclera clear, no icterus.   Conjunctiva  pink. Ears:  Normal auditory acuity. Nose:  No deformity, discharge,  or lesions. Mouth:  No deformity or lesions, dentition normal. Lungs:  Clear throughout to auscultation.   No wheezes, crackles, or rhonchi. No acute distress. Heart:  Irregularly irregulay Abdomen:  Hypoactive bowel sounds, tender to palpation upper abdomen, distended but still soft, without rebound or guarding.  Rectal:  Deferred  Msk:  Symmetrical without gross deformities. Normal posture. Extremities:  With pedal edema  Neurologic:  Alert and  oriented x4;. Psych:  Alert and cooperative. Normal mood and affect.  Intake/Output from previous day: 12/04 0701 - 12/05 0700 In: 360 [P.O.:360] Out: -  Intake/Output this shift:    Lab Results:  Recent Labs  05/29/2015 1215 05/05/15 0548 05-31-2015 0808  WBC 23.2* 25.5* 49.7*  HGB 13.9 14.1 16.4*  HCT 41.0 42.1 47.9*  PLT 130* 127* 159   BMET  Recent Labs  05/20/2015 1215 05/05/15 0548 May 31, 2015 0808  NA 134* 135 130*  K 2.9* 3.0* 3.9  CL 88* 93* 94*  CO2 GLUCOSE 105* 71 156*  BUN 26* 27* 37*  CREATININE 1.31* 1.28* 2.22*  CALCIUM 8.1* 7.8* 7.2*  LFT  Recent Labs  05/05/15 0548 05/23/2015 0808  PROT 5.8* 5.3*  ALBUMIN 2.7* 2.3*  AST 21 36  ALT 13* 17  ALKPHOS 78 98  BILITOT 1.9* 1.9*   PT/INR  Recent Labs  05/19/2015 2106  LABPROT 18.2*  INR 1.50*   C-Diff  Recent Labs  05/05/15 0405  CDIFFTOX POSITIVE*    Studies/Results: Ct Abdomen Pelvis Wo Contrast  05/24/2015  CLINICAL DATA:  Nausea. Clinical diagnosis of small bowel obstruction. Previous cholecystectomy and hernia repair. EXAM: CT ABDOMEN AND PELVIS WITHOUT CONTRAST TECHNIQUE: Multidetector CT imaging of the abdomen and pelvis was performed following the standard protocol without IV contrast. COMPARISON:  Radiographs obtained earlier today. FINDINGS: Lower chest:  No acute findings. Hepatobiliary: Mildly lobulated liver contours with a mildly prominent lateral segment  left lobe. Surgically absent gallbladder. No visible biliary ductal dilatation. Pancreas: Diffusely atrophied and predominantly fatty replaced. Spleen: Within normal limits in size. Adrenals/Urinary Tract: No evidence of urolithiasis or hydronephrosis. No definite mass visualized on this un-enhanced exam. Stomach/Bowel: Small sliding hiatal hernia. Concentric, medium and low density wall thickening involving the splenic flexure, descending colon and proximal sigmoid colon with mild pericolonic soft tissue stranding. Similar changes are noted involving the cecum and inferior right colon. There is less wall thickening involving the transverse colon with mild gaseous distention of the transverse colon. Mildly prominent gas distended small bowel loops. Normal sized stomach. The appendix is not definitely identified. Vascular/Lymphatic: Atheromatous arterial calcifications, including the coronary arteries. Dense mitral valve annulus calcifications. No enlarged lymph nodes. Reproductive: No mass or other significant abnormality. Other: Small amount of free peritoneal fluid. Diffuse subcutaneous edema. Small to moderate-sized umbilical hernia containing fat. Musculoskeletal: Lumbar lower thoracic spine degenerative changes. These include facet degenerative changes with associated mild anterolisthesis at the L4-5 level. No pars defects or fractures are seen. IMPRESSION: 1. Pain colitis, most pronounced involving the cecum, and inferior right colon, splenic flexure and descending colon. This could be infectious or inflammatory nature. 2. Small amount of free peritoneal fluid. 3. Mild colonic and small bowel ileus. 4. Small hiatal hernia. 5. Extensive atheromatous arterial calcifications, including the coronary arteries. 6. Small to moderate-sized umbilical hernia containing fat. 7. Diffuse anasarca. 8. Possible early changes of cirrhosis of the liver. Electronically Signed   By: Beckie Salts M.D.   On: 05/18/2015 15:29    Dg Abd 2 Views  05/29/2015  CLINICAL DATA:  Nausea. History of atrial fibrillation, coronary artery disease, hypertension, diabetes. EXAM: ABDOMEN - 2 VIEW COMPARISON:  None. FINDINGS: Small bowel within the central abdomen and left lower quadrant is distended with gas, mild to moderate in degree. Relatively decompressed small bowel is identified in the right lower quadrant. No evidence of soft tissue mass or abnormal fluid collection. No free intraperitoneal air seen. There are extensive atherosclerotic changes of the splenic artery. Degenerative changes are noted throughout the thoracolumbar spine, at least moderate in degree. No acute- appearing osseous abnormality identified. Dense tubular calcific-appearing structure is again identified over the left heart border, previously described as mitral valve calcification. IMPRESSION: Distended gas-filled loops of small bowel throughout the central abdomen and left lower quadrant, mild to moderate in degree, suspicious for developing small bowel obstruction. Would consider CT for confirmation. Additional chronic/incidental findings detailed above. Electronically Signed   By: Bary Richard M.D.   On: 06/01/2015 13:42   Dg Abd Portable 2v  05/05/2015  CLINICAL DATA:  Nausea, vomiting x3 months EXAM: PORTABLE ABDOMEN - 2 VIEW COMPARISON:  CT  abdomen pelvis dated 05-25-15 FINDINGS: Nonobstructive bowel gas pattern. Mild degenerative changes of the lumbar spine. Visualized bony pelvis appears intact. IMPRESSION: Unremarkable abdominal radiograph. Electronically Signed   By: Charline Bills M.D.   On: 05/05/2015 16:29    Impression: 81 year old female admitted with severe/complicated Cdiff infection as evidenced by leukocytosis, worsening renal function, hypoalbuminemia, possible ileus on admission but with repeat abd xray without evidence of obstruction. CT with pancolitis, query cirrhotic changes. No evidence of obstruction or megacolon. Clinically, nausea  and vomiting has improved. Physical exam with just mild tenderness. Does not appear to have obstructive symptoms. Worsening leukocytosis and acute on chronic renal failure a concern. Agree with Flagyl 500 mg IV TID and Vancomycin 250 mg QID. With complicated presentation, would add Vancomycin enemas 500 mg/161ml every 6 hours, retaining for 60 minutes if at all possible. Will need to follow BUN/Cr closely. Agree with florastor. Continue to avoid PPI use. Monitor for worsening abdominal distension, sudden cessation of diarrhea, any signs/symptoms of megacolon. Recommend surgical consultation if any worsening of status for diverting loop ileostomy. Condition guarded at this time.   Possible cirrhosis on CT: further work-up as outpatient  Plan: Continue Flagyl 500 mg IV TID, Vancomycin 250 mg oral QID, add vancomycin enemas to retain for 60 minutes QID Follow BUN/Cr closely Repeat CBC in am Monitor for deterioration, abdominal distension, signs/symptoms of megacolon Surgical consultation recommended if worsening status Continue to avoid PPI Will continue to follow with you  Nira Retort, ANP-BC [facility]     LOS: 2 days    05/25/2015, 11:38 AM  Patient seen and examined this this evening. She has complicated severe C. difficile infection. Fully agree with the oral and topical vancomycin. Prognosis guarded.

## 2015-05-06 NOTE — Consult Note (Signed)
Reason for Consult: Acute kidney injury superimposed on chronic Referring Physician: Dr. Dennard Schaumann Alexandra [name] is an 81 y.o. female.  HPI: She is a patient who has history of diabetes, hypertension, coronary artery disease status post angioplasty and stent placement presently came with complaints of some nausea, gagging and abdominal pain of a couple of days' duration. Patient also has some episode of diarrhea. In emergency room where she was evaluated shows found to have leukocytosis, pancolitis and out atrial fibrillation admitted to the hospital. Presently patient is found to have C. difficile colitis and BUN and creatinine continued to decrease. Patient denies any previous history of kidney stone or renal failure. She has been taking diuretics for congestive heart failure. Presently still her appetite is very poor, nausea is better, diarrhea is also improving. She is complaining of pain from Foley catheter insertion.  Past Medical History  Diagnosis Date  . Asthmatic bronchitis   . Sarcoidosis ([place])     a. 1990s of lung tx with prednisone temporarily.  [name] HTN (hypertension)   . PVD (peripheral vascular disease) ([place])     a. 06/2012 Carotid U/S: < 50% bilat ICA stenosis.  . Hyperlipemia   . DM (diabetes mellitus) ([place])   . Obesity   . GERD (gastroesophageal reflux disease)   . Diverticulosis of colon   . IBS (irritable bowel syndrome)   . DJD (degenerative joint disease)     Hip and knee pain; hip bursitis; patellar subluxation  . Vitamin D deficiency disease   . Fibromyalgia   . Anxiety     a. Guaiac neg 03/2012.  . Intermittent vertigo   . Anemia   . Venous insufficiency   . PSVT (paroxysmal supraventricular tachycardia) ([place])     a. Prior hx of palpitations - developed SVT during stress Myoview 09/2011. b. Noted on tele 03/2012.  . Valvular heart disease     a. moderately thickened/mildly calcified aortic valve, mod MR 03/20/12  . CKD (chronic kidney disease), stage II   . PAC  (premature atrial contraction)   . CAD (coronary artery disease) 03/2011    a. NSTEMI 15/1761 complicated by pulm edema s/p DES to subtotal LAD. b. [place] s/p PTCA/cutting balloon to prox LAD for ISR 12/01/11 (med rx for RCA/Cx dz).  c. NSTEMI 03/2012 s/p cutting angioplasty of ISR of prox LAD 03/21/12 (PLAVIX NONRESPONDER). d. DES to LAD for ISR 07/2012 (pt refused consideration of CABG).  [name] A-fib ([place])     a. Noted 07/2012 - not on anticoag due for need for DAPT and age.  . Thrombocytopenia ([place])     a. Noted 07/2012.  . On home oxygen therapy     2L/min with ambulation as of 07/2012.    Past Surgical History  Procedure Laterality Date  . Cholecystectomy    . Tonsillectomy    . Hernia repair    . Cataract extraction    . Coronary angioplasty with stent placement  03/21/2012  . Left heart catheterization with coronary angiogram N/A 12/01/2011    Procedure: LEFT HEART CATHETERIZATION WITH CORONARY ANGIOGRAM;  Surgeon: Peter M Martinique, MD;  Location: [facility] CATH LAB;  Service: Cardiovascular;  Laterality: N/A;  . Percutaneous coronary stent intervention (pci-s)  12/01/2011    Procedure: PERCUTANEOUS CORONARY STENT INTERVENTION (PCI-S);  Surgeon: Peter M Martinique, MD;  Location: [facility] CATH LAB;  Service: Cardiovascular;;  . Left heart catheterization with coronary angiogram N/A 03/21/2012    Procedure: LEFT HEART CATHETERIZATION WITH CORONARY ANGIOGRAM;  Surgeon: Elby Showers  Aundra Dubin, MD;  Location: [facility] CATH LAB;  Service: Cardiovascular;  Laterality: N/A;  . Percutaneous coronary stent intervention (pci-s)  03/21/2012    Procedure: PERCUTANEOUS CORONARY STENT INTERVENTION (PCI-S);  Surgeon: Burnell Blanks, MD;  Location: [facility] CATH LAB;  Service: Cardiovascular;;  . Left heart catheterization with coronary angiogram N/A 08/18/2012    Procedure: LEFT HEART CATHETERIZATION WITH CORONARY ANGIOGRAM;  Surgeon: Burnell Blanks, MD;  Location: [facility] CATH LAB;  Service: Cardiovascular;  Laterality: N/A;  .  Percutaneous coronary stent intervention (pci-s)  08/18/2012    Procedure: PERCUTANEOUS CORONARY STENT INTERVENTION (PCI-S);  Surgeon: Burnell Blanks, MD;  Location: [facility] CATH LAB;  Service: Cardiovascular;;   des to prox lad with ivus and cutting balloon to existing lad stent     Family History  Problem Relation Age of Onset  . Heart failure Father     died at 87  . Leukemia Mother     died at 61  . Colon cancer Neg Hx     Social History:  reports that she has never smoked. She has never used smokeless tobacco. She reports that she does not drink alcohol or use illicit drugs.  Allergies:  Allergies  Allergen Reactions  . Codeine Other (See Comments)    Reaction: severe GI pain  . Diazepam Other (See Comments)    REACTION: causes pt unable to wake up after taking  . Lipitor [Atorvastatin Calcium] Other (See Comments)    Reaction: causes vertigo  . Penicillins Other (See Comments)    Reaction: unknown (childhood )  . Plavix [Clopidogrel Bisulfate]     Nonresponder  . Sulfonamide Derivatives Other (See Comments)    Reaction: unknown (childhood)  . Mango Flavor Rash    Medications: I have reviewed the patient's current medications.  Results for orders placed or performed during the hospital encounter of 06/01/2015 (from the past 48 hour(s))  Urinalysis, Routine w reflex microscopic (not at [facility])     Status: Abnormal   Collection Time: 05/24/2015  4:09 PM  Result Value Ref Range   Color, Urine YELLOW YELLOW   APPearance CLEAR CLEAR   Specific Gravity, Urine 1.025 1.005 - 1.030   pH 5.5 5.0 - 8.0   Glucose, UA NEGATIVE NEGATIVE mg/dL   Hgb urine dipstick NEGATIVE NEGATIVE   Bilirubin Urine MODERATE (A) NEGATIVE   Ketones, ur TRACE (A) NEGATIVE mg/dL   Protein, ur TRACE (A) NEGATIVE mg/dL   Nitrite NEGATIVE NEGATIVE   Leukocytes, UA NEGATIVE NEGATIVE  Urine microscopic-add on     Status: Abnormal   Collection Time: 05/05/2015  4:09 PM  Result Value Ref Range   Squamous  Epithelial / LPF 0-5 (A) NONE SEEN   WBC, UA 0-5 0 - 5 WBC/hpf   RBC / HPF 0-5 0 - 5 RBC/hpf   Bacteria, UA MANY (A) NONE SEEN  Glucose, capillary     Status: Abnormal   Collection Time: 05/22/2015  6:03 PM  Result Value Ref Range   Glucose-Capillary 102 (H) 65 - 99 mg/dL   Comment 1 Notify RN    Comment 2 Document in Chart   Culture, blood (x 2)     Status: None (Preliminary result)   Collection Time: 05/09/2015  9:06 PM  Result Value Ref Range   Specimen Description LEFT ANTECUBITAL    Special Requests BOTTLES DRAWN AEROBIC AND ANAEROBIC 6CC    Culture NO GROWTH 2 DAYS    Report Status PENDING   Lactic acid, plasma  Status: None   Collection Time: 05/20/2015  9:06 PM  Result Value Ref Range   Lactic Acid, Venous 1.3 0.5 - 2.0 mmol/L  Procalcitonin     Status: None   Collection Time: 05/15/2015  9:06 PM  Result Value Ref Range   Procalcitonin 0.34 ng/mL    Comment:        Interpretation: PCT (Procalcitonin) <= 0.5 ng/mL: Systemic infection (sepsis) is not likely. Local bacterial infection is possible. (NOTE)         ICU PCT Algorithm               Non ICU PCT Algorithm    ----------------------------     ------------------------------         PCT < 0.25 ng/mL                 PCT < 0.1 ng/mL     Stopping of antibiotics            Stopping of antibiotics       strongly encouraged.               strongly encouraged.    ----------------------------     ------------------------------       PCT level decrease by               PCT < 0.25 ng/mL       >= 80% from peak PCT       OR PCT 0.25 - 0.5 ng/mL          Stopping of antibiotics                                             encouraged.     Stopping of antibiotics           encouraged.    ----------------------------     ------------------------------       PCT level decrease by              PCT >= 0.25 ng/mL       < 80% from peak PCT        AND PCT >= 0.5 ng/mL            Continuin g antibiotics                                               encouraged.       Continuing antibiotics            encouraged.    ----------------------------     ------------------------------     PCT level increase compared          PCT > 0.5 ng/mL         with peak PCT AND          PCT >= 0.5 ng/mL             Escalation of antibiotics                                          strongly encouraged.      Escalation of antibiotics        strongly encouraged.  Protime-INR     Status: Abnormal   Collection Time: 05/15/2015  9:06 PM  Result Value Ref Range   Prothrombin Time 18.2 (H) 11.6 - 15.2 seconds   INR 1.50 (H) 0.00 - 1.49  APTT     Status: None   Collection Time: 05/17/2015  9:06 PM  Result Value Ref Range   aPTT 34 24 - 37 seconds  Troponin I     Status: Abnormal   Collection Time: 05/24/2015  9:06 PM  Result Value Ref Range   Troponin I 0.06 (H) <0.031 ng/mL    Comment:        PERSISTENTLY INCREASED TROPONIN VALUES IN THE RANGE OF 0.04-0.49 ng/mL CAN BE SEEN IN:       -UNSTABLE ANGINA       -CONGESTIVE HEART FAILURE       -MYOCARDITIS       -CHEST TRAUMA       -ARRYHTHMIAS       -LATE PRESENTING MYOCARDIAL INFARCTION       -COPD   CLINICAL FOLLOW-UP RECOMMENDED.   Culture, blood (x 2)     Status: None (Preliminary result)   Collection Time: 05/20/2015  9:11 PM  Result Value Ref Range   Specimen Description LEFT ANTECUBITAL    Special Requests BOTTLES DRAWN AEROBIC AND ANAEROBIC 6CC    Culture NO GROWTH 2 DAYS    Report Status PENDING   Glucose, capillary     Status: None   Collection Time: 05/17/2015  9:26 PM  Result Value Ref Range   Glucose-Capillary 84 65 - 99 mg/dL  Lactic acid, plasma     Status: None   Collection Time: 05/05/15 12:29 AM  Result Value Ref Range   Lactic Acid, Venous 1.3 0.5 - 2.0 mmol/L  Troponin I     Status: Abnormal   Collection Time: 05/05/15 12:29 AM  Result Value Ref Range   Troponin I 0.06 (H) <0.031 ng/mL    Comment:        PERSISTENTLY INCREASED TROPONIN VALUES IN THE RANGE OF  0.04-0.49 ng/mL CAN BE SEEN IN:       -UNSTABLE ANGINA       -CONGESTIVE HEART FAILURE       -MYOCARDITIS       -CHEST TRAUMA       -ARRYHTHMIAS       -LATE PRESENTING MYOCARDIAL INFARCTION       -COPD   CLINICAL FOLLOW-UP RECOMMENDED.   C difficile quick scan w PCR reflex     Status: Abnormal   Collection Time: 05/05/15  4:05 AM  Result Value Ref Range   C Diff antigen POSITIVE (A) NEGATIVE   C Diff toxin POSITIVE (A) NEGATIVE   C Diff interpretation Positive for toxigenic C. difficile     Comment: CRITICAL RESULT CALLED TO, READ BACK BY AND VERIFIED WITH: Jerrye Noble AT 0918 ON 05/05/2015 BY WOODS, M   Comprehensive metabolic panel     Status: Abnormal   Collection Time: 05/05/15  5:48 AM  Result Value Ref Range   Sodium 135 135 - 145 mmol/L   Potassium 3.0 (L) 3.5 - 5.1 mmol/L   Chloride 93 (L) 101 - 111 mmol/L   CO2 27 22 - 32 mmol/L   Glucose, Bld 71 65 - 99 mg/dL   BUN 27 (H) 6 - 20 mg/dL   Creatinine, Ser 1.28 (H) 0.44 - 1.00 mg/dL   Calcium 7.8 (L) 8.9 - 10.3 mg/dL   Total Protein 5.8 (L) 6.5 -  8.1 g/dL   Albumin 2.7 (L) 3.5 - 5.0 g/dL   AST 21 15 - 41 U/L   ALT 13 (L) 14 - 54 U/L   Alkaline Phosphatase 78 38 - 126 U/L   Total Bilirubin 1.9 (H) 0.3 - 1.2 mg/dL   GFR calc non Af Amer 36 (L) >60 mL/min   GFR calc Af Amer 42 (L) >60 mL/min    Comment: (NOTE) The eGFR has been calculated using the CKD EPI equation. This calculation has not been validated in all clinical situations. eGFR's persistently <60 mL/min signify possible Chronic Kidney Disease.    Anion gap 15 5 - 15  CBC     Status: Abnormal   Collection Time: 05/05/15  5:48 AM  Result Value Ref Range   WBC 25.5 (H) 4.0 - 10.5 K/uL   RBC 4.56 3.87 - 5.11 MIL/uL   Hemoglobin 14.1 12.0 - 15.0 g/dL   HCT 42.1 36.0 - 46.0 %   MCV 92.3 78.0 - 100.0 fL   MCH 30.9 26.0 - 34.0 pg   MCHC 33.5 30.0 - 36.0 g/dL   RDW 15.4 11.5 - 15.5 %   Platelets 127 (L) 150 - 400 K/uL  Troponin I     Status: Abnormal    Collection Time: 05/05/15  5:48 AM  Result Value Ref Range   Troponin I 0.06 (H) <0.031 ng/mL    Comment:        PERSISTENTLY INCREASED TROPONIN VALUES IN THE RANGE OF 0.04-0.49 ng/mL CAN BE SEEN IN:       -UNSTABLE ANGINA       -CONGESTIVE HEART FAILURE       -MYOCARDITIS       -CHEST TRAUMA       -ARRYHTHMIAS       -LATE PRESENTING MYOCARDIAL INFARCTION       -COPD   CLINICAL FOLLOW-UP RECOMMENDED.   Glucose, capillary     Status: None   Collection Time: 05/05/15  8:49 AM  Result Value Ref Range   Glucose-Capillary 85 65 - 99 mg/dL  Glucose, capillary     Status: None   Collection Time: 05/05/15 12:19 PM  Result Value Ref Range   Glucose-Capillary 76 65 - 99 mg/dL  Glucose, capillary     Status: None   Collection Time: 05/05/15  5:04 PM  Result Value Ref Range   Glucose-Capillary 89 65 - 99 mg/dL  Glucose, capillary     Status: Abnormal   Collection Time: 05/05/15  8:44 PM  Result Value Ref Range   Glucose-Capillary 105 (H) 65 - 99 mg/dL   Comment 1 Notify RN    Comment 2 Document in Chart   Glucose, capillary     Status: Abnormal   Collection Time: 05/24/2015  7:32 AM  Result Value Ref Range   Glucose-Capillary 154 (H) 65 - 99 mg/dL   Comment 1 Notify RN    Comment 2 Document in Chart   CBC     Status: Abnormal   Collection Time: 05/16/2015  8:08 AM  Result Value Ref Range   WBC 49.7 (H) 4.0 - 10.5 K/uL   RBC 5.25 (H) 3.87 - 5.11 MIL/uL   Hemoglobin 16.4 (H) 12.0 - 15.0 g/dL   HCT 47.9 (H) 36.0 - 46.0 %   MCV 91.2 78.0 - 100.0 fL   MCH 31.2 26.0 - 34.0 pg   MCHC 34.2 30.0 - 36.0 g/dL   RDW 15.3 11.5 - 15.5 %   Platelets 159 150 -  400 K/uL  Comprehensive metabolic panel     Status: Abnormal   Collection Time: 05/27/2015  8:08 AM  Result Value Ref Range   Sodium 130 (L) 135 - 145 mmol/L   Potassium 3.9 3.5 - 5.1 mmol/L    Comment: DELTA CHECK NOTED   Chloride 94 (L) 101 - 111 mmol/L   CO2 23 22 - 32 mmol/L   Glucose, Bld 156 (H) 65 - 99 mg/dL   BUN 37 (H) 6 -  20 mg/dL   Creatinine, Ser 2.22 (H) 0.44 - 1.00 mg/dL   Calcium 7.2 (L) 8.9 - 10.3 mg/dL   Total Protein 5.3 (L) 6.5 - 8.1 g/dL   Albumin 2.3 (L) 3.5 - 5.0 g/dL   AST 36 15 - 41 U/L   ALT 17 14 - 54 U/L   Alkaline Phosphatase 98 38 - 126 U/L   Total Bilirubin 1.9 (H) 0.3 - 1.2 mg/dL   GFR calc non Af Amer 18 (L) >60 mL/min   GFR calc Af Amer 21 (L) >60 mL/min    Comment: (NOTE) The eGFR has been calculated using the CKD EPI equation. This calculation has not been validated in all clinical situations. eGFR's persistently <60 mL/min signify possible Chronic Kidney Disease.    Anion gap 13 5 - 15  Glucose, capillary     Status: Abnormal   Collection Time: 05/25/2015 11:37 AM  Result Value Ref Range   Glucose-Capillary 150 (H) 65 - 99 mg/dL   Comment 1 Notify RN    Comment 2 Document in Chart   CBC     Status: Abnormal   Collection Time: 05/20/2015  1:33 PM  Result Value Ref Range   WBC 58.1 (HH) 4.0 - 10.5 K/uL    Comment: RESULT REPEATED AND VERIFIED INCREASED BANDS (>20% BANDS) CRITICAL RESULT CALLED TO, READ BACK BY AND VERIFIED WITH: DILDY,V AT 1415 ON 05/18/2015 BY ISLEY,B    RBC 5.15 (H) 3.87 - 5.11 MIL/uL   Hemoglobin 16.4 (H) 12.0 - 15.0 g/dL   HCT 46.6 (H) 36.0 - 46.0 %   MCV 90.5 78.0 - 100.0 fL   MCH 31.8 26.0 - 34.0 pg   MCHC 35.2 30.0 - 36.0 g/dL   RDW 15.1 11.5 - 15.5 %   Platelets 149 (L) 150 - 400 K/uL    Ct Abdomen Pelvis Wo Contrast  05/24/2015  CLINICAL DATA:  Nausea. Clinical diagnosis of small bowel obstruction. Previous cholecystectomy and hernia repair. EXAM: CT ABDOMEN AND PELVIS WITHOUT CONTRAST TECHNIQUE: Multidetector CT imaging of the abdomen and pelvis was performed following the standard protocol without IV contrast. COMPARISON:  Radiographs obtained earlier today. FINDINGS: Lower chest:  No acute findings. Hepatobiliary: Mildly lobulated liver contours with a mildly prominent lateral segment left lobe. Surgically absent gallbladder. No visible biliary  ductal dilatation. Pancreas: Diffusely atrophied and predominantly fatty replaced. Spleen: Within normal limits in size. Adrenals/Urinary Tract: No evidence of urolithiasis or hydronephrosis. No definite mass visualized on this un-enhanced exam. Stomach/Bowel: Small sliding hiatal hernia. Concentric, medium and low density wall thickening involving the splenic flexure, descending colon and proximal sigmoid colon with mild pericolonic soft tissue stranding. Similar changes are noted involving the cecum and inferior right colon. There is less wall thickening involving the transverse colon with mild gaseous distention of the transverse colon. Mildly prominent gas distended small bowel loops. Normal sized stomach. The appendix is not definitely identified. Vascular/Lymphatic: Atheromatous arterial calcifications, including the coronary arteries. Dense mitral valve annulus calcifications. No enlarged lymph nodes.  Reproductive: No mass or other significant abnormality. Other: Small amount of free peritoneal fluid. Diffuse subcutaneous edema. Small to moderate-sized umbilical hernia containing fat. Musculoskeletal: Lumbar lower thoracic spine degenerative changes. These include facet degenerative changes with associated mild anterolisthesis at the L4-5 level. No pars defects or fractures are seen. IMPRESSION: 1. Pain colitis, most pronounced involving the cecum, and inferior right colon, splenic flexure and descending colon. This could be infectious or inflammatory nature. 2. Small amount of free peritoneal fluid. 3. Mild colonic and small bowel ileus. 4. Small hiatal hernia. 5. Extensive atheromatous arterial calcifications, including the coronary arteries. 6. Small to moderate-sized umbilical hernia containing fat. 7. Diffuse anasarca. 8. Possible early changes of cirrhosis of the liver. Electronically Signed   By: Claudie Revering M.D.   On: 05/13/2015 15:29   Dg Abd Portable 2v  05/05/2015  CLINICAL DATA:  Nausea,  vomiting x3 months EXAM: PORTABLE ABDOMEN - 2 VIEW COMPARISON:  CT abdomen pelvis dated 05/30/2015 FINDINGS: Nonobstructive bowel gas pattern. Mild degenerative changes of the lumbar spine. Visualized bony pelvis appears intact. IMPRESSION: Unremarkable abdominal radiograph. Electronically Signed   By: Julian Hy M.D.   On: 05/05/2015 16:29    Review of Systems  Constitutional: Positive for chills. Negative for fever.  Respiratory: Negative for shortness of breath.   Cardiovascular: Negative for orthopnea.  Gastrointestinal: Positive for nausea, abdominal pain and diarrhea.  Neurological: Positive for weakness.   Blood pressure 101/45, pulse 104, temperature 98 F (36.7 C), temperature source Oral, resp. rate 20, height _0  (1.676 m), weight 175 lb 6.4 oz (79.561 kg), SpO2 97 %. Physical Exam  Constitutional: No distress.  Eyes: No scleral icterus.  Neck: JVD present.  Cardiovascular:  Murmur heard. Respiratory: She has no wheezes. She has no rales.  GI: She exhibits distension. There is tenderness. There is no rebound and no guarding.  Musculoskeletal: She exhibits no edema.  Neurological: She is alert.    Assessment/Plan: Problem #1 acute kidney injury superimposed on chronic possibly secondary to prerenal syndrome associated with poor by mouth intake/diuretics/diarrhea. ATN and other etiologies at this moment cannot be ruled out. CT scan of the abdomen didn't show any height Problem #2 chronic renal failure: Creatinine 0.9 on 12/27/2011 and GFR was 54 mL/m. On pain/28/2013 creatinine increased to 1.28 with a GFR of 37 mL/m. Since then her creatinine has been remaining more or less the same with some fluctuation. On 03/29/2015 creatinine was 1.69 and GFR was 27 mL/m his underlying chronic renal failure early stage IV or late stage III. Etiology at this moment is no clear with likely a combination of diabetes/hypertension/ischemic and lastly age-related renal function. Problem #3  hypertension: Her blood pressure is low normal Problem #4 history of diabetes Problem #5 C. difficile colitis Problem #6 hypokalemia: Most likely related to diuretics presently her potassium is normal Problem #7 mild hyponatremia most likely secondary to intravascular volume depletion with hypotonic fluid replacement. Problem #8 sarcoidosis Plan: Agree with hydration and will increase IV fluid rate 1 25 mL/m 2] once patient is adequately hydrated we'll use some diuretics to improve her urine output 3] agree with holding diuretics 4] basic metabolic panel in the morning.  Serria Sloma S 05/28/2015, 2:51 PM

## 2015-05-06 NOTE — Care Management Note (Signed)
Case Management Note  Patient Details  Name: Traci SermonJudith T Oplinger MRN: [id number] Date of Birth: 06-19-1923  Subjective/Objective:                  Pt admitted from home with nausea/vomiting, and now is c diff positive. Pt lives alone in a senior citizen apartment.   Action/Plan: Pt was unable to talk with CM due to nausea and vomiting. Will assess discharge planning needs in the next 24 hours.  Expected Discharge Date:  05/31/2015               Expected Discharge Plan:  Home w Home Health Services  In-House Referral:  NA  Discharge planning Services  CM Consult  Post Acute Care Choice:  Home Health Choice offered to:  Patient  DME Arranged:    DME Agency:     HH Arranged:    HH Agency:     Status of Service:  In process, will continue to follow  Medicare Important Message Given:    Date Medicare IM Given:    Medicare IM give by:    Date Additional Medicare IM Given:    Additional Medicare Important Message give by:     If discussed at Long Length of Stay Meetings, dates discussed:    Additional Comments:  Cheryl FlashBlackwell, Katlyn Muldrew Crowder, RN 05/05/2015, 2:37 PM

## 2015-05-06 NOTE — Progress Notes (Addendum)
[facility] PROGRESS NOTE  Traci SermonJudith T Espindola WUJ:[id number] DOB: 11-19-1923 DOA: 05/19/2015  PCP: Michele McalpineNADEL,SCOTT M, MD  Brief HPI: 81 year old female lives in the apartment complex meant for seniors who presented with nausea. She also had abdominal pain. She was an extremely poor historian. CT scan revealed pan colitis. She had recently taken clindamycin for cellulitis.  Past medical history:  Past Medical History  Diagnosis Date  . Asthmatic bronchitis   . Sarcoidosis (HCC)     a. 1990s of lung tx with prednisone temporarily.  [name]. HTN (hypertension)   . PVD (peripheral vascular disease) (HCC)     a. 06/2012 Carotid U/S: < 50% bilat ICA stenosis.  . Hyperlipemia   . DM (diabetes mellitus) (HCC)   . Obesity   . GERD (gastroesophageal reflux disease)   . Diverticulosis of colon   . IBS (irritable bowel syndrome)   . DJD (degenerative joint disease)     Hip and knee pain; hip bursitis; patellar subluxation  . Vitamin D deficiency disease   . Fibromyalgia   . Anxiety     a. Guaiac neg 03/2012.  . Intermittent vertigo   . Anemia   . Venous insufficiency   . PSVT (paroxysmal supraventricular tachycardia) (HCC)     a. Prior hx of palpitations - developed SVT during stress Myoview 09/2011. b. Noted on tele 03/2012.  . Valvular heart disease     a. moderately thickened/mildly calcified aortic valve, mod MR 03/20/12  . CKD (chronic kidney disease), stage II   . PAC (premature atrial contraction)   . CAD (coronary artery disease) 03/2011    a. NSTEMI 03/2011 complicated by pulm edema s/p DES to subtotal LAD. b. [place] s/p PTCA/cutting balloon to prox LAD for ISR 12/01/11 (med rx for RCA/Cx dz).  c. NSTEMI 03/2012 s/p cutting angioplasty of ISR of prox LAD 03/21/12 (PLAVIX NONRESPONDER). d. DES to LAD for ISR 07/2012 (pt refused consideration of CABG).  [name]. A-fib (HCC)     a. Noted 07/2012 - not on anticoag due for need for DAPT and age.  . Thrombocytopenia (HCC)     a. Noted 07/2012.  . On home  oxygen therapy     2L/min with ambulation as of 07/2012.    Consultants: None  Procedures: None  Antibiotics: Started on Cipro and Flagyl. On 12/3 Vancomycin added on 12/4 Ciprofloxacin discontinued on 12/4  Subjective: Patient continues to be nauseated. However, she does say that she feels a little bit better in terms of her abdominal pain. Diarrhea is improved per patient.  Objective: Vital Signs  Filed Vitals:   05/05/15 1516 05/05/15 1700 05/05/15 2115 September 16, 2014 0552  BP: 92/73 103/59 95/57 101/45  Pulse: 126 94 111 104  Temp:   98 F (36.7 C) 98 F (36.7 C)  TempSrc:   Oral Oral  Resp:   20 20  Height:      Weight:      SpO2:   98% 97%    Intake/Output Summary (Last 24 hours) at September 16, 2014 1130 Last data filed at 05/05/15 1730  Gross per 24 hour  Intake    240 ml  Output      0 ml  Net    240 ml   Filed Weights   05/30/2015 1154 05/03/2015 1804  Weight: 79.379 kg (175 lb) 79.561 kg (175 lb 6.4 oz)    General appearance: alert, cooperative, appears stated age and no distress Resp: clear to auscultation bilaterally Cardio: S1, S2 is irregularly irregular. No S3,  S4. No rubs, murmurs, or bruit.  GI: Soft. Mildly tender diffusely without any rebound, rigidity or guarding. No masses or organomegaly. Bowel sounds are present. Much change in exam findings compared to yesterday. Extremities: edema 1+ in the lower extremities Pulses: 2+ and symmetric Neurologic: She is alert. She is oriented to place, person and time. No facial asymmetry. Moving all her extremities. Does have generalized weakness.  Lab Results:  Basic Metabolic Panel:  Recent Labs Lab 05/26/2015 1215 05/05/15 0548 05/16/2015 0808  NA 134* 135 130*  K 2.9* 3.0* 3.9  CL 88* 93* 94*  CO2 GLUCOSE 105* 71 156*  BUN 26* 27* 37*  CREATININE 1.31* 1.28* 2.22*  CALCIUM 8.1* 7.8* 7.2*   Liver Function Tests:  Recent Labs Lab 05/05/15 0548 05/21/2015 0808  AST 21 36  ALT 13* 17  ALKPHOS 78  98  BILITOT 1.9* 1.9*  PROT 5.8* 5.3*  ALBUMIN 2.7* 2.3*   CBC:  Recent Labs Lab 05/27/2015 1215 05/05/15 0548 05/10/2015 0808  WBC 23.2* 25.5* 49.7*  NEUTROABS 21.6*  --   --   HGB 13.9 14.1 16.4*  HCT 41.0 42.1 47.9*  MCV 91.7 92.3 91.2  PLT 130* 127* 159   Cardiac Enzymes:  Recent Labs Lab 05/03/2015 1215 05/18/2015 2106 05/05/15 0029 05/05/15 0548  TROPONINI 0.07* 0.06* 0.06* 0.06*   CBG:  Recent Labs Lab 05/05/15 0849 05/05/15 1219 05/05/15 1704 05/05/15 2044 05/14/2015 0732  GLUCAP 85 76 89 105* 154*    Recent Results (from the past 240 hour(s))  Culture, blood (x 2)     Status: None (Preliminary result)   Collection Time: 05/21/2015  9:06 PM  Result Value Ref Range Status   Specimen Description LEFT ANTECUBITAL  Final   Special Requests BOTTLES DRAWN AEROBIC AND ANAEROBIC 6CC  Final   Culture NO GROWTH 2 DAYS  Final   Report Status PENDING  Incomplete  Culture, blood (x 2)     Status: None (Preliminary result)   Collection Time: 05/18/2015  9:11 PM  Result Value Ref Range Status   Specimen Description LEFT ANTECUBITAL  Final   Special Requests BOTTLES DRAWN AEROBIC AND ANAEROBIC 6CC  Final   Culture NO GROWTH 2 DAYS  Final   Report Status PENDING  Incomplete  C difficile quick scan w PCR reflex     Status: Abnormal   Collection Time: 05/05/15  4:05 AM  Result Value Ref Range Status   C Diff antigen POSITIVE (A) NEGATIVE Final   C Diff toxin POSITIVE (A) NEGATIVE Final   C Diff interpretation Positive for toxigenic C. difficile  Final    Comment: CRITICAL RESULT CALLED TO, READ BACK BY AND VERIFIED WITH: BULLINS, M AT 0918 ON 05/05/2015 BY WOODS, M       Studies/Results: Ct Abdomen Pelvis Wo Contrast  05/31/2015  CLINICAL DATA:  Nausea. Clinical diagnosis of small bowel obstruction. Previous cholecystectomy and hernia repair. EXAM: CT ABDOMEN AND PELVIS WITHOUT CONTRAST TECHNIQUE: Multidetector CT imaging of the abdomen and pelvis was performed following  the standard protocol without IV contrast. COMPARISON:  Radiographs obtained earlier today. FINDINGS: Lower chest:  No acute findings. Hepatobiliary: Mildly lobulated liver contours with a mildly prominent lateral segment left lobe. Surgically absent gallbladder. No visible biliary ductal dilatation. Pancreas: Diffusely atrophied and predominantly fatty replaced. Spleen: Within normal limits in size. Adrenals/Urinary Tract: No evidence of urolithiasis or hydronephrosis. No definite mass visualized on this un-enhanced exam. Stomach/Bowel: Small sliding hiatal hernia. Concentric, medium and  low density wall thickening involving the splenic flexure, descending colon and proximal sigmoid colon with mild pericolonic soft tissue stranding. Similar changes are noted involving the cecum and inferior right colon. There is less wall thickening involving the transverse colon with mild gaseous distention of the transverse colon. Mildly prominent gas distended small bowel loops. Normal sized stomach. The appendix is not definitely identified. Vascular/Lymphatic: Atheromatous arterial calcifications, including the coronary arteries. Dense mitral valve annulus calcifications. No enlarged lymph nodes. Reproductive: No mass or other significant abnormality. Other: Small amount of free peritoneal fluid. Diffuse subcutaneous edema. Small to moderate-sized umbilical hernia containing fat. Musculoskeletal: Lumbar lower thoracic spine degenerative changes. These include facet degenerative changes with associated mild anterolisthesis at the L4-5 level. No pars defects or fractures are seen. IMPRESSION: 1. Pain colitis, most pronounced involving the cecum, and inferior right colon, splenic flexure and descending colon. This could be infectious or inflammatory nature. 2. Small amount of free peritoneal fluid. 3. Mild colonic and small bowel ileus. 4. Small hiatal hernia. 5. Extensive atheromatous arterial calcifications, including the  coronary arteries. 6. Small to moderate-sized umbilical hernia containing fat. 7. Diffuse anasarca. 8. Possible early changes of cirrhosis of the liver. Electronically Signed   By: Beckie Salts M.D.   On: 2015-05-14 15:29   Dg Abd 2 Views  14-May-2015  CLINICAL DATA:  Nausea. History of atrial fibrillation, coronary artery disease, hypertension, diabetes. EXAM: ABDOMEN - 2 VIEW COMPARISON:  None. FINDINGS: Small bowel within the central abdomen and left lower quadrant is distended with gas, mild to moderate in degree. Relatively decompressed small bowel is identified in the right lower quadrant. No evidence of soft tissue mass or abnormal fluid collection. No free intraperitoneal air seen. There are extensive atherosclerotic changes of the splenic artery. Degenerative changes are noted throughout the thoracolumbar spine, at least moderate in degree. No acute- appearing osseous abnormality identified. Dense tubular calcific-appearing structure is again identified over the left heart border, previously described as mitral valve calcification. IMPRESSION: Distended gas-filled loops of small bowel throughout the central abdomen and left lower quadrant, mild to moderate in degree, suspicious for developing small bowel obstruction. Would consider CT for confirmation. Additional chronic/incidental findings detailed above. Electronically Signed   By: Bary Richard M.D.   On: 14-May-2015 13:42   Dg Abd Portable 2v  05/05/2015  CLINICAL DATA:  Nausea, vomiting x3 months EXAM: PORTABLE ABDOMEN - 2 VIEW COMPARISON:  CT abdomen pelvis dated May 14, 2015 FINDINGS: Nonobstructive bowel gas pattern. Mild degenerative changes of the lumbar spine. Visualized bony pelvis appears intact. IMPRESSION: Unremarkable abdominal radiograph. Electronically Signed   By: Charline Bills M.D.   On: 05/05/2015 16:29    Medications:  Scheduled: . antiseptic oral rinse  7 mL Mouth Rinse BID  . aspirin EC  81 mg Oral q morning - 10a  .  heparin  5,000 Units Subcutaneous 3 times per day  . Influenza vac split quadrivalent PF  0.5 mL Intramuscular Tomorrow-1000  . insulin aspart  0-9 Units Subcutaneous TID WC  . levothyroxine  50 mcg Oral QAC breakfast  . metoprolol  2.5 mg Intravenous 4 times per day  . metronidazole  500 mg Intravenous Q8H  . prasugrel  5 mg Oral Daily  . saccharomyces boulardii  250 mg Oral BID  . sodium chloride  500 mL Intravenous Once  . sodium chloride  3 mL Intravenous Q12H  . vancomycin  250 mg Oral 4 times per day   Continuous: . dextrose 5 % and 0.9%  NaCl     YNW:[id number] **OR** acetaminophen, morphine injection, ondansetron **OR** ondansetron (ZOFRAN) IV, oxyCODONE, promethazine  Assessment/Plan:  Principal Problem:   C. difficile colitis Active Problems:   Sarcoidosis (HCC)   Diabetes mellitus, type II (HCC)   Hypertension   Arteriosclerotic cardiovascular disease (ASCVD)   Atrial fibrillation (HCC)   Sepsis (HCC)    Severe C. difficile colitis with sepsis  Patient was recently prescribed clindamycin for lower extremity cellulitis. This is the most likely reason for her C. difficile infection. She was started on Cipro and Flagyl at the time of admission. Cipro was subsequently discontinued once the results of C. difficile came back. Oral vancomycin was added due to severity of her illness. Probiotics as well. Lactic acid was normal. Pro-calcitonin 0.34. Continue clear liquids for now. Her WBC has significantly worsened. Abdominal film done yesterday afternoon did not show any concerning findings. Gastroenterology will be consulted to assist with management. Prognosis is guarded at this time.  Acute on chronic kidney disease stage III with hyponatremia and hypokalemia  Renal function worse this morning. Urine output not being charted. Foley catheter will be placed. She'll be given fluid boluses. Nephrology will be consulted. Check UA and urine electrolytes.  Mildly elevated  troponin Most likely secondary to acute illness/demand ischemia. Patient denies any chest pain. EKG did not show any ischemic changes. She does have a known history of coronary artery disease and atrial fibrillation. Troponin is not significantly elevated. No further workup anticipated.   History of chronic atrial fibrillation with mild RVR Heart rate improved with beta blocker. Heart rate was elevated, likely due to sepsis. She is not on anticoagulation due to the fact that she is on dual antiplatelet agents.  History of coronary artery disease Stable. See above. She is followed by cardiology closely. She is on dual antiplatelet agents. She has a history of non-ST elevation MI in 2014. At that time CABG was offered but the patient refused. Continue medical management.  History of diabetes mellitus type 2 Holding her oral agent. Blood glucose is borderline low. Continue Sliding scale insulin coverage. Last available HbA1c is from February, which was 6.7.  History of chronic diastolic CHF She does have lower extremity edema. However, she appeared to be intravascularly depleted at the time of admission and she was given IV fluids. Hold off on resuming Lasix now that her renal function is worse.  ADDENDUM I received a call again from the patient's family including her son, as well as daughter-in-law Beryl Hornberger. Mrs. Fess is a Publishing rights manager in [place]. Her phone number is [phone]. I gave her all the information regarding patient's condition. She discuss this further with the patient's son. It was finally decided that the patient will be DO NOT RESUSCITATE. They understand patient's guarded prognosis at this time.  DVT Prophylaxis: Subcutaneous heparin    Code Status: Full code. Son agrees with full code. However, he also agrees that if the patient gets worse this issue will be readdressed. Family Communication: Discussed with the patient. Discussed with her son, Mr. Fantroy. He lives in  [place]. He was advised of patient's worsening blood work. He will plan to come up to see her. Disposition Plan: Continue current treatment. Prognosis is guarded.    LOS: 2 days   [facility]  [facility] Pager [phone] 05/16/2015, 11:30 AM  If 7PM-7AM, please contact night-coverage at [email or web address], password [facility]

## 2015-05-06 NOTE — Progress Notes (Signed)
OT Cancellation Note  Patient Details Name: Alexandra SermonJudith T [name] MRN: [id number] DOB: 11-Jun-1923   Cancelled Treatment:    Reason Eval/Treat Not Completed: Patient declined, no reason specified. Attempted to complete OT evaluation this AM. Patient declined stating that she was nauseous and has been sick for weeks. She stated she could not be bothered. Will re-attempt with appropriate.   Alexandra [name], OTR/L,CBIS  [phone]  05/05/2015, 8:41 AM

## 2015-05-09 ENCOUNTER — Telehealth: Payer: Self-pay | Admitting: Pulmonary Disease

## 2015-05-09 LAB — CULTURE, BLOOD (ROUTINE X 2)
CULTURE: NO GROWTH
CULTURE: NO GROWTH

## 2015-05-14 NOTE — Telephone Encounter (Signed)
FYI to Dr. Kriste BasqueNadel, patient passed away 05/12/2015

## 2015-05-20 ENCOUNTER — Ambulatory Visit: Payer: Medicare Other | Admitting: Cardiovascular Disease

## 2015-06-02 NOTE — Discharge Summary (Addendum)
   Death Summary  Traci SermonJudith T Hord JXB:[id number] DOB: November 15, 1923 DOA: 05/28/2015  PCP: Michele McalpineNADEL,SCOTT M, MD PCP/Office notified: Via epic  Admit date: 05/19/2015 Date of Death: 05/10/2015  Final Diagnoses:  Principal Problem:   C. difficile colitis Active Problems:   Sarcoidosis (HCC)   Diabetes mellitus, type II (HCC)   Hypertension   Arteriosclerotic cardiovascular disease (ASCVD)   Atrial fibrillation (HCC)   Sepsis (HCC)    Patient expired on 05/03/2015 at approximately 10:55 PM.   History of present illness:  82 year old female lives in the apartment complex meant for seniors who presented with nausea. She also had abdominal pain. She was an extremely poor historian. CT scan revealed pan colitis. She had recently taken clindamycin for cellulitis.  Hospital Course:  Severe C. difficile colitis with sepsis  Patient was recently prescribed clindamycin for lower extremity cellulitis. This was the most likely reason for her C. difficile infection. She was started on Cipro and Flagyl at the time of admission. Cipro was subsequently discontinued once the results of C. difficile came back. Oral vancomycin was added due to severity of her illness. Probiotics as well. Lactic acid was normal. Pro-calcitonin 0.34. She was maintained on clear liquid diet. Her WBC continued to rise. Repeat abdominal films did not show any concerning findings. Gastroenterology was consulted. Vancomycin enema was ordered. Prognosis was thought to be guarded. Based on my conversation with the nurse taking care of the patient last night, it appears that the patient may have had a sudden cardiac event. She was already a DO NOT RESUSCITATE by then.  Acute on chronic kidney disease stage III with hyponatremia and hypokalemia  Patient was placed on IV fluids. However, her renal function continued to get worse. Nephrology was consulted.   Mildly elevated troponin Most likely secondary to acute illness/demand ischemia.  Patient denies any chest pain. EKG did not show any ischemic changes. She does have a known history of coronary artery disease and atrial fibrillation. Troponin is not significantly elevated.   History of chronic atrial fibrillation with mild RVR Heart rate improved with beta blocker. Heart rate was elevated, likely due to sepsis. She is not on anticoagulation due to the fact that she is on dual antiplatelet agents.  History of coronary artery disease She is followed by cardiology closely. She is on dual antiplatelet agents. She has a history of non-ST elevation MI in 2014. At that time CABG was offered but the patient refused.   History of diabetes mellitus type 2 Blood sugars will be monitored closely.   History of chronic diastolic CHF She was noted to have lower extremity edema. However, she appeared to be intravascularly depleted at the time of admission and she was given IV fluids. Lasix was held.  Her guarded prognosis was already discussed with the patient's son and daughter-in-law who [place]. Based on that conversation patient was made a DO NOT RESUSCITATE. However, her death was still unexpected.  Patient expired on 05/15/2015 at approximately 10:55 PM.  Osvaldo ShipperKRISHNAN,Gerardine Peltz  [facility] 05/31/2015, 1:29 PM

## 2015-06-02 DEATH — deceased

## 2015-09-28 IMAGING — CR DG CHEST 2V
2 series · 2 of 2 positions shown · non-contrast
Comparison: 08/08/2014

CLINICAL DATA: Sarcoidosis.

EXAM:
CHEST  2 VIEW

[view not recorded (1 of 2)]
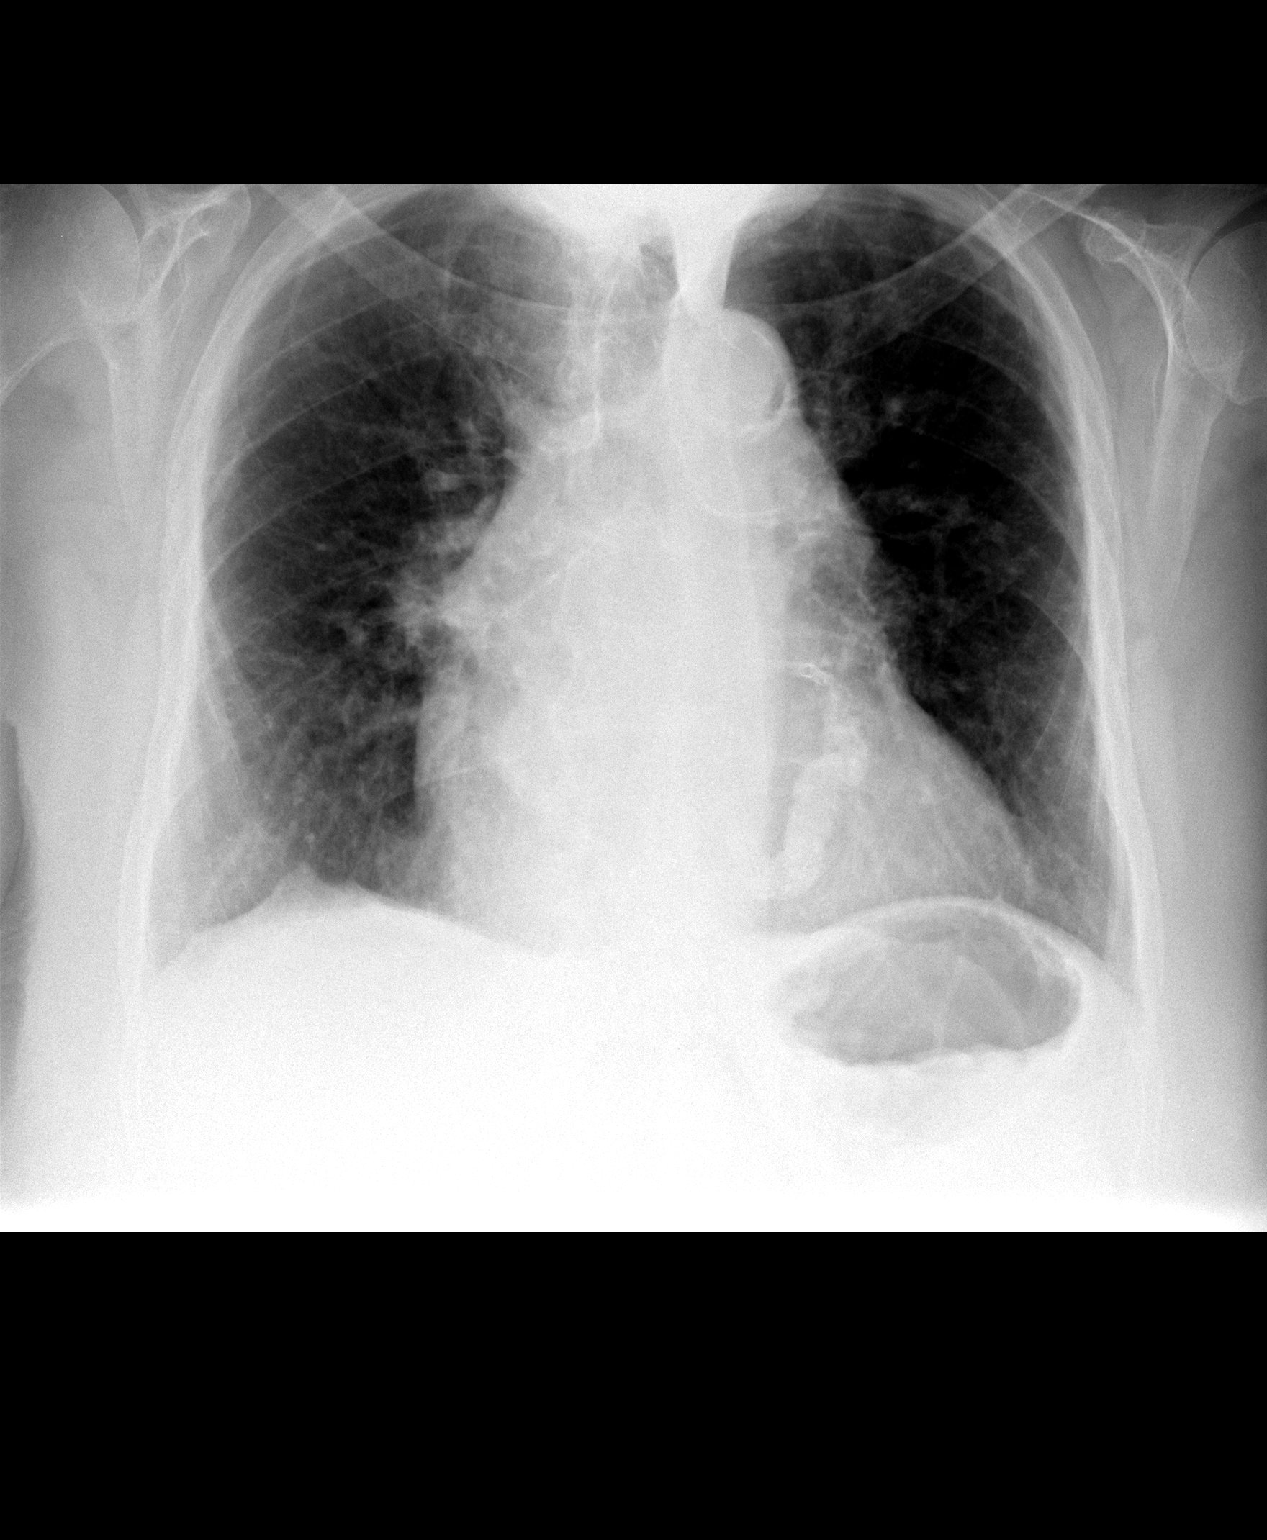

[view not recorded (2 of 2)]
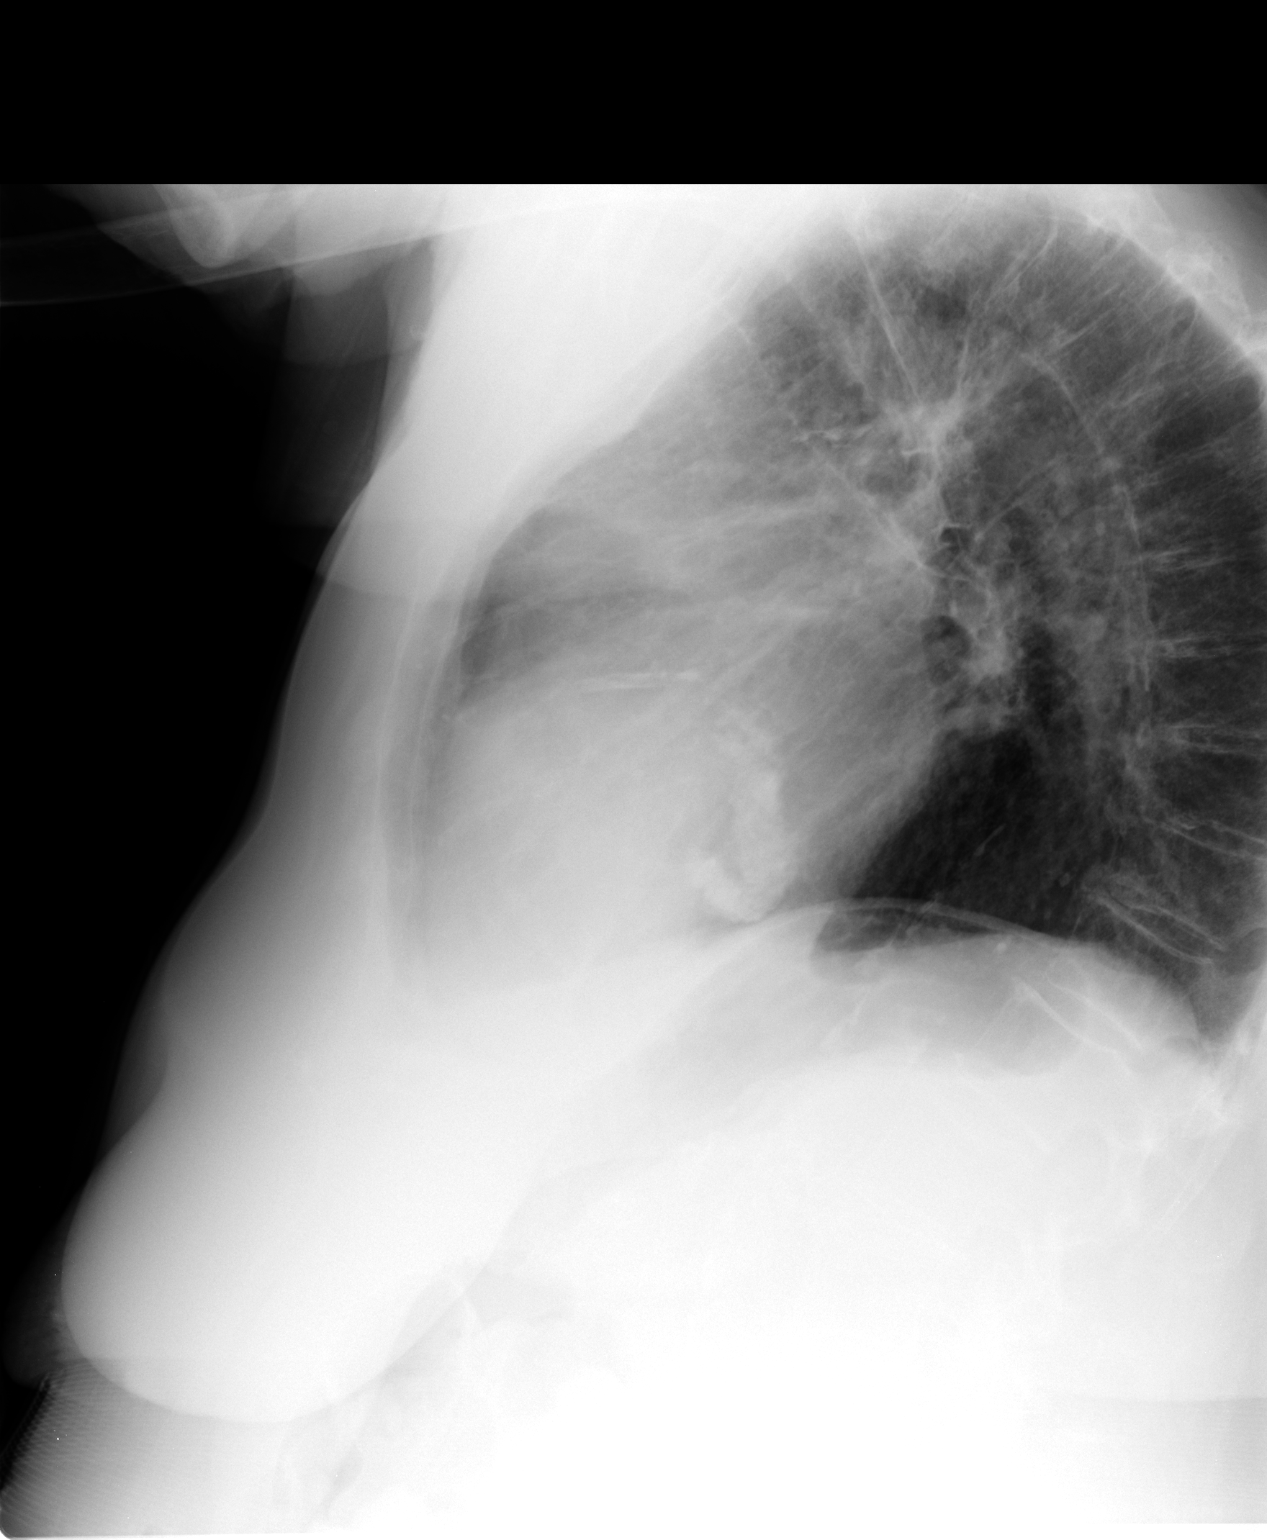

[2 of 2 positions shown; findings below may reference images not displayed]

FINDINGS: The heart is enlarged but stable. Mild tortuosity and ectasia of the
thoracic aorta appears stable. The pulmonary hila appear grossly
stable. Dense mitral valve annular calcifications are noted along
with a coronary artery stent. There are chronic bronchitic type
interstitial lung changes and vague nodularity. Findings could be
due to sarcoidosis. No pleural effusion or acute infiltrate. The
bony structures are intact.
IMPRESSION: Stable cardiac enlargement and tortuous ectatic thoracic aorta.

Chronic bronchitic interstitial changes and vague nodularity.

No acute pulmonary findings.
